# Patient Record
Sex: Female | Born: 1945 | Race: White | Hispanic: No | Marital: Married | Smoking: Never smoker
Health system: Southern US, Community
[De-identification: ages and names within clinical notes are randomized; demographics above are authoritative.]

## PROBLEM LIST (undated history)

## (undated) DIAGNOSIS — E119 Type 2 diabetes mellitus without complications: Secondary | ICD-10-CM

## (undated) DIAGNOSIS — J45909 Unspecified asthma, uncomplicated: Secondary | ICD-10-CM

## (undated) DIAGNOSIS — Z974 Presence of external hearing-aid: Secondary | ICD-10-CM

## (undated) DIAGNOSIS — M1990 Unspecified osteoarthritis, unspecified site: Secondary | ICD-10-CM

## (undated) DIAGNOSIS — M549 Dorsalgia, unspecified: Secondary | ICD-10-CM

## (undated) DIAGNOSIS — M7061 Trochanteric bursitis, right hip: Secondary | ICD-10-CM

## (undated) DIAGNOSIS — F32A Depression, unspecified: Secondary | ICD-10-CM

## (undated) DIAGNOSIS — K7581 Nonalcoholic steatohepatitis (NASH): Secondary | ICD-10-CM

## (undated) DIAGNOSIS — G4733 Obstructive sleep apnea (adult) (pediatric): Secondary | ICD-10-CM

## (undated) DIAGNOSIS — S0300XA Dislocation of jaw, unspecified side, initial encounter: Secondary | ICD-10-CM

## (undated) DIAGNOSIS — H359 Unspecified retinal disorder: Secondary | ICD-10-CM

## (undated) DIAGNOSIS — E785 Hyperlipidemia, unspecified: Secondary | ICD-10-CM

## (undated) DIAGNOSIS — IMO0001 Reserved for inherently not codable concepts without codable children: Secondary | ICD-10-CM

## (undated) DIAGNOSIS — R1110 Vomiting, unspecified: Secondary | ICD-10-CM

## (undated) DIAGNOSIS — N3943 Post-void dribbling: Secondary | ICD-10-CM

## (undated) DIAGNOSIS — H2513 Age-related nuclear cataract, bilateral: Secondary | ICD-10-CM

## (undated) DIAGNOSIS — L409 Psoriasis, unspecified: Secondary | ICD-10-CM

## (undated) DIAGNOSIS — K589 Irritable bowel syndrome without diarrhea: Secondary | ICD-10-CM

## (undated) DIAGNOSIS — K3184 Gastroparesis: Secondary | ICD-10-CM

## (undated) DIAGNOSIS — R0902 Hypoxemia: Secondary | ICD-10-CM

## (undated) DIAGNOSIS — M75102 Unspecified rotator cuff tear or rupture of left shoulder, not specified as traumatic: Secondary | ICD-10-CM

## (undated) DIAGNOSIS — G8929 Other chronic pain: Secondary | ICD-10-CM

## (undated) DIAGNOSIS — R0602 Shortness of breath: Secondary | ICD-10-CM

## (undated) DIAGNOSIS — T7840XA Allergy, unspecified, initial encounter: Secondary | ICD-10-CM

## (undated) DIAGNOSIS — I10 Essential (primary) hypertension: Secondary | ICD-10-CM

## (undated) DIAGNOSIS — M7661 Achilles tendinitis, right leg: Secondary | ICD-10-CM

## (undated) DIAGNOSIS — F329 Major depressive disorder, single episode, unspecified: Secondary | ICD-10-CM

## (undated) HISTORY — DX: Essential (primary) hypertension: I10

## (undated) HISTORY — DX: Unspecified retinal disorder: H35.9

## (undated) HISTORY — DX: Age-related nuclear cataract, bilateral: H25.13

## (undated) HISTORY — DX: Irritable bowel syndrome, unspecified: K58.9

## (undated) HISTORY — DX: Unspecified osteoarthritis, unspecified site: M19.90

## (undated) HISTORY — DX: Other chronic pain: G89.29

## (undated) HISTORY — DX: Depression, unspecified: F32.A

## (undated) HISTORY — DX: Dislocation of jaw, unspecified side, initial encounter: S03.00XA

## (undated) HISTORY — DX: Unspecified rotator cuff tear or rupture of left shoulder, not specified as traumatic: M75.102

## (undated) HISTORY — DX: Obstructive sleep apnea (adult) (pediatric): G47.33

## (undated) HISTORY — DX: Hypoxemia: R09.02

## (undated) HISTORY — DX: Post-void dribbling: N39.43

## (undated) HISTORY — PX: EXCISION / CURETTAGE BONE CYST FINGER: SUR476

## (undated) HISTORY — PX: BREAST BIOPSY: SHX20

## (undated) HISTORY — DX: Allergy, unspecified, initial encounter: T78.40XA

## (undated) HISTORY — DX: Gastroparesis: K31.84

## (undated) HISTORY — DX: Trochanteric bursitis, right hip: M70.61

## (undated) HISTORY — PX: ABDOMINAL HYSTERECTOMY: SHX81

## (undated) HISTORY — DX: Psoriasis, unspecified: L40.9

## (undated) HISTORY — DX: Reserved for inherently not codable concepts without codable children: IMO0001

## (undated) HISTORY — DX: Shortness of breath: R06.02

## (undated) HISTORY — PX: PILONIDAL CYST EXCISION: SHX744

## (undated) HISTORY — PX: TONSILLECTOMY: SUR1361

## (undated) HISTORY — DX: Unspecified asthma, uncomplicated: J45.909

## (undated) HISTORY — DX: Nonalcoholic steatohepatitis (NASH): K75.81

## (undated) HISTORY — DX: Achilles tendinitis, right leg: M76.61

## (undated) HISTORY — PX: APPENDECTOMY: SHX54

## (undated) HISTORY — DX: Major depressive disorder, single episode, unspecified: F32.9

## (undated) HISTORY — PX: STOMACH SURGERY: SHX791

## (undated) HISTORY — DX: Vomiting, unspecified: R11.10

## (undated) HISTORY — DX: Hyperlipidemia, unspecified: E78.5

## (undated) HISTORY — DX: Dorsalgia, unspecified: M54.9

---

## 2007-06-25 LAB — HM COLONOSCOPY: HM Colonoscopy: NORMAL

## 2008-06-24 LAB — HM DEXA SCAN: HM Dexa Scan: NORMAL

## 2009-08-31 ENCOUNTER — Ambulatory Visit: Payer: Self-pay | Admitting: Family Medicine

## 2010-12-18 ENCOUNTER — Ambulatory Visit: Payer: Self-pay | Admitting: Family Medicine

## 2011-04-23 ENCOUNTER — Ambulatory Visit: Payer: Self-pay | Admitting: Unknown Physician Specialty

## 2011-04-23 DIAGNOSIS — Z01810 Encounter for preprocedural cardiovascular examination: Secondary | ICD-10-CM

## 2011-04-23 DIAGNOSIS — Z9889 Other specified postprocedural states: Secondary | ICD-10-CM | POA: Insufficient documentation

## 2011-04-23 HISTORY — PX: SHOULDER ARTHROSCOPY: SHX128

## 2011-12-16 ENCOUNTER — Ambulatory Visit: Payer: Self-pay | Admitting: Family Medicine

## 2011-12-31 ENCOUNTER — Emergency Department: Payer: Self-pay | Admitting: Emergency Medicine

## 2011-12-31 LAB — COMPREHENSIVE METABOLIC PANEL
Alkaline Phosphatase: 85 U/L (ref 50–136)
Anion Gap: 12 (ref 7–16)
Calcium, Total: 9.5 mg/dL (ref 8.5–10.1)
Chloride: 101 mmol/L (ref 98–107)
Co2: 26 mmol/L (ref 21–32)
EGFR (Non-African Amer.): 45 — ABNORMAL LOW
Glucose: 163 mg/dL — ABNORMAL HIGH (ref 65–99)
SGOT(AST): 23 U/L (ref 15–37)
SGPT (ALT): 30 U/L

## 2011-12-31 LAB — CBC
HGB: 14.9 g/dL (ref 12.0–16.0)
MCH: 30 pg (ref 26.0–34.0)
RBC: 4.99 10*6/uL (ref 3.80–5.20)
RDW: 13.3 % (ref 11.5–14.5)

## 2011-12-31 LAB — CK TOTAL AND CKMB (NOT AT ARMC)
CK, Total: 37 U/L (ref 21–215)
CK-MB: 0.6 ng/mL (ref 0.5–3.6)

## 2011-12-31 LAB — TROPONIN I: Troponin-I: <0.02 ng/mL

## 2012-03-26 ENCOUNTER — Ambulatory Visit: Payer: Self-pay | Admitting: Specialist

## 2012-06-29 ENCOUNTER — Ambulatory Visit: Payer: Self-pay | Admitting: Orthopedic Surgery

## 2012-11-10 ENCOUNTER — Ambulatory Visit: Payer: Self-pay | Admitting: Family Medicine

## 2012-11-11 LAB — HM MAMMOGRAPHY: HM Mammogram: NORMAL

## 2014-01-03 DIAGNOSIS — G4733 Obstructive sleep apnea (adult) (pediatric): Secondary | ICD-10-CM | POA: Insufficient documentation

## 2014-04-22 LAB — LIPID PANEL
CHOLESTEROL: 159 mg/dL (ref 0–200)
HDL: 48 mg/dL (ref 35–70)
LDL Cholesterol: 77 mg/dL
TRIGLYCERIDES: 170 mg/dL — AB (ref 40–160)

## 2014-04-26 ENCOUNTER — Ambulatory Visit: Payer: Self-pay | Admitting: Family Medicine

## 2014-08-05 ENCOUNTER — Ambulatory Visit: Payer: Self-pay | Admitting: Vascular Surgery

## 2014-08-30 LAB — HEMOGLOBIN A1C: HEMOGLOBIN A1C: 7.3 % — AB (ref 4.0–6.0)

## 2014-11-23 ENCOUNTER — Telehealth: Payer: Self-pay | Admitting: Family Medicine

## 2014-11-23 NOTE — Telephone Encounter (Signed)
Pt requesting refills and would like them sent to [facility].

## 2014-11-24 ENCOUNTER — Other Ambulatory Visit: Payer: Self-pay | Admitting: Family Medicine

## 2014-11-24 MED ORDER — GABAPENTIN 100 MG PO CAPS: 100.0000 mg | ORAL_CAPSULE | Freq: Three times a day (TID) | ORAL | Status: DC

## 2014-11-24 NOTE — Telephone Encounter (Signed)
Please inform patient I sent rx of gabapentin to mail order.

## 2014-11-28 ENCOUNTER — Other Ambulatory Visit: Payer: Self-pay | Admitting: Family Medicine

## 2014-11-28 MED ORDER — METFORMIN HCL ER 500 MG PO TB24: ORAL_TABLET | ORAL | Status: DC

## 2014-12-02 ENCOUNTER — Other Ambulatory Visit: Payer: Self-pay | Admitting: Family Medicine

## 2014-12-02 NOTE — Telephone Encounter (Signed)
Pt needs refill

## 2014-12-06 ENCOUNTER — Encounter: Payer: Medicare Other | Attending: Specialist | Admitting: Respiratory Therapy

## 2014-12-06 ENCOUNTER — Encounter: Payer: Self-pay | Admitting: Respiratory Therapy

## 2014-12-06 VITALS — BP 118/74 | HR 78 | Resp 12 | Ht 66.0 in | Wt 209.1 lb

## 2014-12-06 DIAGNOSIS — J4520 Mild intermittent asthma, uncomplicated: Secondary | ICD-10-CM | POA: Diagnosis not present

## 2014-12-06 DIAGNOSIS — G4730 Sleep apnea, unspecified: Secondary | ICD-10-CM | POA: Insufficient documentation

## 2014-12-06 NOTE — Progress Notes (Signed)
Pulmonary Individual Treatment Plan  Patient Details  Name: Mary Cantrell MRN: [id number] Date of Birth: 03-10-1946 Referring Provider:  Erby Pian, MD  Initial Encounter Date: Date: 12/06/14  Visit Diagnosis: Asthma in adult, mild intermittent, uncomplicated  Sleep apnea  Patient's Home Medications on Admission:  Current outpatient prescriptions:  .  aspirin 81 MG tablet, Take 81 mg by mouth daily., Disp: , Rfl:  .  budesonide-formoterol (SYMBICORT) 160-4.5 MCG/ACT inhaler, Inhale into the lungs., Disp: , Rfl:  .  gabapentin (NEURONTIN) 100 MG capsule, Take 1 capsule (100 mg total) by mouth 3 (three) times daily., Disp: 180 capsule, Rfl: 4 .  albuterol (VENTOLIN HFA) 108 (90 BASE) MCG/ACT inhaler, Inhale into the lungs., Disp: , Rfl:  .  Cinnamon 500 MG capsule, Take by mouth., Disp: , Rfl:  .  fluticasone (FLONASE) 50 MCG/ACT nasal spray, Place into the nose., Disp: , Rfl:  .  glimepiride (AMARYL) 2 MG tablet, Take by mouth., Disp: , Rfl:  .  losartan (COZAAR) 50 MG tablet, TAKE 1 TABLET DAILY FOR BLOOD PRESSURE, Disp: 90 tablet, Rfl: 0 .  meloxicam (MOBIC) 7.5 MG tablet, Take by mouth., Disp: , Rfl:  .  metFORMIN (GLUCOPHAGE-XR) 500 MG 24 hr tablet, One in am and 2 in pm, Disp: 90 tablet, Rfl: 2 .  pravastatin (PRAVACHOL) 40 MG tablet, Take by mouth., Disp: , Rfl:  .  spironolactone-hydrochlorothiazide (ALDACTAZIDE) 25-25 MG per tablet, Take by mouth., Disp: , Rfl:   Past Medical History: No past medical history on file.  Tobacco Use: History  Smoking status  . Never Smoker   Smokeless tobacco  . Not on file    Labs: Recent Review Flowsheet Data    There is no flowsheet data to display.       ADL [facility]:     ADL [facility]      12/06/14 1130       ADL [facility]   ADL Phase Entry     SOB Score total 73     Rest 2     Walk 2     Stairs 4     Bath 3     Dress 2     Shop 4         Pulmonary Function Assessment:     Pulmonary Function Assessment - 12/06/14  1130    Initial Spirometry Results   FVC% 84 %   FEV1% 88 %   FEV1/FVC Ratio 83.25      Exercise Target Goals: Date: 12/06/14  Exercise Program Goal: Individual exercise prescription set with THRR, safety & activity barriers. Participant demonstrates ability to understand and report RPE using BORG scale, to self-measure pulse accurately, and to acknowledge the importance of the exercise prescription.  Exercise Prescription Goal: Starting with aerobic activity 30 plus minutes a day, 3 days per week for initial exercise prescription. Provide home exercise prescription and guidelines that participant acknowledges understanding prior to discharge.  Activity Barriers & Risk Stratification:     Activity Barriers & Risk Stratification - 12/06/14 1130    Activity Barriers & Risk Stratification   Activity Barriers Shortness of Breath;Deconditioning;Balance Concerns   Risk Stratification Moderate      6 Minute Walk:     6 Minute Walk      12/06/14 1240       6 Minute Walk   Phase Initial     Distance 1320 feet     Walk Time 6 minutes     Resting HR 78 bpm  Resting BP 118/74 mmHg     Max Ex. HR 84 bpm     Max Ex. BP 132/84 mmHg     RPE 13     Perceived Dyspnea  3     Symptoms No        Initial Exercise Prescription:     Initial Exercise Prescription - 12/06/14 1700    Date of Initial Exercise Prescription   Date 12/06/14   Treadmill   MPH 1.5   Grade 0   Minutes 10   Recumbant Bike   Level 1   RPM 40   Watts 20   Minutes 10   NuStep   Level 4   Watts 40   Minutes 10   Arm Ergometer   Level 1   Watts 10   Minutes 10   Arm/Foot Ergometer   Level 1   Watts 12   Minutes 10   Recumbant Elliptical   Level 1   RPM 40   Watts 40   Minutes 10   Elliptical   Level 1   Speed 3   Minutes 1   REL-XR   Level 1   Watts 40   Minutes 10   Prescription Details   Frequency (times per week) 3   Duration Progress to 30 minutes of continuous aerobic without  signs/symptoms of physical distress   Intensity   THRR REST +  30   Ratings of Perceived Exertion 11-15   Perceived Dyspnea 2-4   Progression Continue progressive overload as per policy without signs/symptoms or physical distress.   Resistance Training   Training Prescription Yes   Weight 1   Reps 10-12      Exercise Prescription Changes:   Discharge Exercise Prescription:    Nutrition:  Target Goals: Understanding of nutrition guidelines, daily intake of sodium <1560m, cholesterol <2023m calories 30% from fat and 7% or less from saturated fats, daily to have 5 or more servings of fruits and vegetables.  Biometrics:    Nutrition Therapy Plan and Nutrition Goals:     Nutrition Therapy & Goals - 12/06/14 1130    Nutrition Therapy   Diet Ms MuVonrudenrefers not to meet with the dietitian; she states she known what to do; she eats out mostly, but does eat only half and takes the rest to go home; she would like to loss 20lbs      Nutrition Discharge: Rate Your Plate Scores:   Psychosocial: Target Goals: Acknowledge presence or absence of depression, maximize coping skills, provide positive support system. Participant is able to verbalize types and ability to use techniques and skills needed for reducing stress and depression.  Initial Review & Psychosocial Screening:     Initial Psych Review & Screening - 12/06/14 1130    Family Dynamics   Good Support System? Yes   Comments Her husband and 2 children are very supportive; her sister lives close and is supportive.   Barriers   Psychosocial barriers to participate in program There are no identifiable barriers or psychosocial needs.;The patient should benefit from training in stress management and relaxation.   Screening Interventions   Interventions Encouraged to exercise;Program counselor consult      Quality of Life Scores:     Quality of Life - 12/06/14 1130    Quality of Life Scores   Health/Function Pre  16.59 %   Socioeconomic Pre 23.93 %   Psych/Spiritual Pre 24 %   Family Pre 26.1 %   GLOBAL Pre 20.81 %  PHQ-9:     Recent Review Flowsheet Data    Depression screen [facility] 2/9 12/06/2014   Decreased Interest 0   Down, Depressed, Hopeless 0   PHQ - 2 Score 0      Psychosocial Evaluation and Intervention:   Psychosocial Re-Evaluation:  Education: Education Goals: Education classes will be provided on a weekly basis, covering required topics. Participant will state understanding/return demonstration of topics presented.  Learning Barriers/Preferences:     Learning Barriers/Preferences - 12/06/14 1130    Learning Barriers/Preferences   Learning Barriers None   Learning Preferences Group Instruction;Individual Instruction;Pictoral;Skilled Demonstration;Verbal Instruction;Video;Written Material      Education Topics: Initial Evaluation Education: - Verbal, written and demonstration of respiratory meds, RPE/PD scales, oximetry and breathing techniques. Instruction on use of nebulizers and MDIs: cleaning and proper use, rinsing mouth with steroid doses and importance of monitoring MDI activations.          Most Recent Value   Date  12/06/14   Educator  LB   Instruction Review Code  2- meets goals/outcomes      General Nutrition Guidelines/Fats and Fiber: -Group instruction provided by verbal, written material, models and posters to present the general guidelines for heart healthy nutrition. Gives an explanation and review of dietary fats and fiber.   Controlling Sodium/Reading Food Labels: -Group verbal and written material supporting the discussion of sodium use in heart healthy nutrition. Review and explanation with models, verbal and written materials for utilization of the food label.   Exercise Physiology & Risk Factors: - Group verbal and written instruction with models to review the exercise physiology of the cardiovascular system and associated critical  values. Details cardiovascular disease risk factors and the goals associated with each risk factor.   Aerobic Exercise & Resistance Training: - Gives group verbal and written discussion on the health impact of inactivity. On the components of aerobic and resistive training programs and the benefits of this training and how to safely progress through these programs.   Flexibility, Balance, General Exercise Guidelines: - Provides group verbal and written instruction on the benefits of flexibility and balance training programs. Provides general exercise guidelines with specific guidelines to those with heart or lung disease. Demonstration and skill practice provided.   Stress Management: - Provides group verbal and written instruction about the health risks of elevated stress, cause of high stress, and healthy ways to reduce stress.   Depression: - Provides group verbal and written instruction on the correlation between heart/lung disease and depressed mood, treatment options, and the stigmas associated with seeking treatment.   Exercise & Equipment Safety: - Individual verbal instruction and demonstration of equipment use and safety with use of the equipment.   Infection Prevention: - Provides verbal and written material to individual with discussion of infection control including proper hand washing and proper equipment cleaning during exercise session.   Falls Prevention: - Provides verbal and written material to individual with discussion of falls prevention and safety.   Diabetes: - Individual verbal and written instruction to review signs/symptoms of diabetes, desired ranges of glucose level fasting, after meals and with exercise. Advice that pre and post exercise glucose checks will be done for 3 sessions at entry of program.   Chronic Lung Diseases: - Group verbal and written instruction to review new updates, new respiratory medications, new advancements in procedures and  treatments. Provide informative websites and "800" numbers of self-education.   Lung Procedures: - Group verbal and written instruction to describe testing methods done to  diagnose lung disease. Review the outcome of test results. Describe the treatment choices: Pulmonary Function Tests, ABGs and oximetry.   Energy Conservation: - Provide group verbal and written instruction for methods to conserve energy, plan and organize activities. Instruct on pacing techniques, use of adaptive equipment and posture/positioning to relieve shortness of breath.   Triggers: - Group verbal and written instruction to review types of environmental controls: home humidity, furnaces, filters, dust mite/pet prevention, HEPA vacuums. To discuss weather changes, air quality and the benefits of nasal washing.   Exacerbations: - Group verbal and written instruction to provide: warning signs, infection symptoms, calling MD promptly, preventive modes, and value of vaccinations. Review: effective airway clearance, coughing and/or vibration techniques. Create an Sports administrator.   Oxygen: - Individual and group verbal and written instruction on oxygen therapy. Includes supplement oxygen, available portable oxygen systems, continuous and intermittent flow rates, oxygen safety, concentrators, and Medicare reimbursement for oxygen.   Respiratory Medications: - Group verbal and written instruction to review medications for lung disease. Drug class, frequency, complications, importance of spacers, rinsing mouth after steroid MDI's, and proper cleaning methods for nebulizers.      Most Recent Value   Date  12/06/14   Educator  LB   Instruction Review Code  2- meets goals/outcomes      AED/CPR: - Group verbal and written instruction with the use of models to demonstrate the basic use of the AED with the basic ABC's of resuscitation.   Breathing Retraining: - Provides individuals verbal and written instruction on  purpose, frequency, and proper technique of diaphragmatic breathing and pursed-lipped breathing. Applies individual practice skills.      Most Recent Value   Date  12/06/14   Educator  LB   Instruction Review Code  2- meets goals/outcomes      Anatomy and Physiology of the Lungs: - Group verbal and written instruction with the use of models to provide basic lung anatomy and physiology related to function, structure and complications of lung disease.   Heart Failure: - Group verbal and written instruction on the basics of heart failure: signs/symptoms, treatments, explanation of ejection fraction, enlarged heart and cardiomyopathy.   Sleep Apnea: - Individual verbal and written instruction to review Obstructive Sleep Apnea. Review of risk factors, methods for diagnosing and types of masks and machines for OSA.   Anxiety: - Provides group, verbal and written instruction on the correlation between heart/lung disease and anxiety, treatment options, and management of anxiety.   Relaxation: - Provides group, verbal and written instruction about the benefits of relaxation for patients with heart/lung disease. Also provides patients with examples of relaxation techniques.   Knowledge Questionnaire Score:     Knowledge Questionnaire Score - 12/06/14 1130    Knowledge Questionnaire Score   Pre Score -2      Personal Goals and Risk Factors at Admission:   Personal Goals and Risk Factors Review:    Personal Goals Discharge:    Comments:

## 2014-12-06 NOTE — Progress Notes (Signed)
Pulmonary Individual Treatment Plan  Patient Details  Name: Mary Cantrell MRN: [id number] Date of Birth: 1946-01-26 Referring Provider:  Erby Pian, MD  Initial Encounter Date:    Visit Diagnosis: Asthma in adult, mild intermittent, uncomplicated  Sleep apnea  Patient's Home Medications on Admission:  Current outpatient prescriptions:    gabapentin (NEURONTIN) 100 MG capsule, Take 1 capsule (100 mg total) by mouth 3 (three) times daily., Disp: 180 capsule, Rfl: 4   losartan (COZAAR) 50 MG tablet, TAKE 1 TABLET DAILY FOR BLOOD PRESSURE, Disp: 90 tablet, Rfl: 0   metFORMIN (GLUCOPHAGE-XR) 500 MG 24 hr tablet, One in am and 2 in pm, Disp: 90 tablet, Rfl: 2  Past Medical History: No past medical history on file.  Tobacco Use: History  Smoking status   Never Smoker   Smokeless tobacco   Not on file    Labs: Recent Review Flowsheet Data    There is no flowsheet data to display.       ADL [facility]:     ADL [facility]      12/06/14 1130       ADL [facility]   ADL Phase Entry     SOB Score total 73     Rest 2     Walk 2     Stairs 4     Bath 3     Dress 2     Shop 4         Pulmonary Function Assessment:     Pulmonary Function Assessment - 12/06/14 1130    Initial Spirometry Results   FVC% 84 %   FEV1% 88 %   FEV1/FVC Ratio 83.25      Exercise Target Goals:    Exercise Program Goal: Individual exercise prescription set with THRR, safety & activity barriers. Participant demonstrates ability to understand and report RPE using BORG scale, to self-measure pulse accurately, and to acknowledge the importance of the exercise prescription.  Exercise Prescription Goal: Starting with aerobic activity 30 plus minutes a day, 3 days per week for initial exercise prescription. Provide home exercise prescription and guidelines that participant acknowledges understanding prior to discharge.  Activity Barriers & Risk Stratification:     Activity Barriers & Risk  Stratification - 12/06/14 1130    Activity Barriers & Risk Stratification   Activity Barriers Shortness of Breath;Deconditioning;Balance Concerns   Risk Stratification Moderate      6 Minute Walk:   Initial Exercise Prescription:   Exercise Prescription Changes:   Discharge Exercise Prescription:    Nutrition:  Target Goals: Understanding of nutrition guidelines, daily intake of sodium <1537m, cholesterol <2065m calories 30% from fat and 7% or less from saturated fats, daily to have 5 or more servings of fruits and vegetables.  Biometrics:    Nutrition Therapy Plan and Nutrition Goals:     Nutrition Therapy & Goals - 12/06/14 1130    Nutrition Therapy   Diet Ms MuCavanaughrefers not to meet with the dietitian; she states she known what to do; she eats out mostly, but does eat only half and takes the rest to go home; she would like to loss 20lbs      Nutrition Discharge: Rate Your Plate Scores:   Psychosocial: Target Goals: Acknowledge presence or absence of depression, maximize coping skills, provide positive support system. Participant is able to verbalize types and ability to use techniques and skills needed for reducing stress and depression.  Initial Review & Psychosocial Screening:     Initial Psych Review &  Screening - 12/06/14 [place]? Yes   Comments Her husband and 2 children are very supportive; her sister lives close and is supportive.   Barriers   Psychosocial barriers to participate in program There are no identifiable barriers or psychosocial needs.;The patient should benefit from training in stress management and relaxation.   Screening Interventions   Interventions Encouraged to exercise;Program counselor consult      Quality of Life Scores:     Quality of Life - 12/06/14 1130    Quality of Life Scores   Health/Function Pre 16.59 %   Socioeconomic Pre 23.93 %   Psych/Spiritual Pre 24 %   Family Pre  26.1 %   GLOBAL Pre 20.81 %      PHQ-9:     Recent Review Flowsheet Data    Depression screen [facility] 2/9 12/06/2014   Decreased Interest 0   Down, Depressed, Hopeless 0   PHQ - 2 Score 0      Psychosocial Evaluation and Intervention:   Psychosocial Re-Evaluation:  Education: Education Goals: Education classes will be provided on a weekly basis, covering required topics. Participant will state understanding/return demonstration of topics presented.  Learning Barriers/Preferences:     Learning Barriers/Preferences - 12/06/14 1130    Learning Barriers/Preferences   Learning Barriers None   Learning Preferences Group Instruction;Individual Instruction;Pictoral;Skilled Demonstration;Verbal Instruction;Video;Written Material      Education Topics: Initial Evaluation Education: - Verbal, written and demonstration of respiratory meds, RPE/PD scales, oximetry and breathing techniques. Instruction on use of nebulizers and MDIs: cleaning and proper use, rinsing mouth with steroid doses and importance of monitoring MDI activations.          Most Recent Value   Date  12/06/14   Educator  LB   Instruction Review Code  2- meets goals/outcomes      General Nutrition Guidelines/Fats and Fiber: -Group instruction provided by verbal, written material, models and posters to present the general guidelines for heart healthy nutrition. Gives an explanation and review of dietary fats and fiber.   Controlling Sodium/Reading Food Labels: -Group verbal and written material supporting the discussion of sodium use in heart healthy nutrition. Review and explanation with models, verbal and written materials for utilization of the food label.   Exercise Physiology & Risk Factors: - Group verbal and written instruction with models to review the exercise physiology of the cardiovascular system and associated critical values. Details cardiovascular disease risk factors and the goals associated with  each risk factor.   Aerobic Exercise & Resistance Training: - Gives group verbal and written discussion on the health impact of inactivity. On the components of aerobic and resistive training programs and the benefits of this training and how to safely progress through these programs.   Flexibility, Balance, General Exercise Guidelines: - Provides group verbal and written instruction on the benefits of flexibility and balance training programs. Provides general exercise guidelines with specific guidelines to those with heart or lung disease. Demonstration and skill practice provided.   Stress Management: - Provides group verbal and written instruction about the health risks of elevated stress, cause of high stress, and healthy ways to reduce stress.   Depression: - Provides group verbal and written instruction on the correlation between heart/lung disease and depressed mood, treatment options, and the stigmas associated with seeking treatment.   Exercise & Equipment Safety: - Individual verbal instruction and demonstration of equipment use and safety with use of the equipment.   Infection  Prevention: - Provides verbal and written material to individual with discussion of infection control including proper hand washing and proper equipment cleaning during exercise session.   Falls Prevention: - Provides verbal and written material to individual with discussion of falls prevention and safety.   Diabetes: - Individual verbal and written instruction to review signs/symptoms of diabetes, desired ranges of glucose level fasting, after meals and with exercise. Advice that pre and post exercise glucose checks will be done for 3 sessions at entry of program.   Chronic Lung Diseases: - Group verbal and written instruction to review new updates, new respiratory medications, new advancements in procedures and treatments. Provide informative websites and "800" numbers of  self-education.   Lung Procedures: - Group verbal and written instruction to describe testing methods done to diagnose lung disease. Review the outcome of test results. Describe the treatment choices: Pulmonary Function Tests, ABGs and oximetry.   Energy Conservation: - Provide group verbal and written instruction for methods to conserve energy, plan and organize activities. Instruct on pacing techniques, use of adaptive equipment and posture/positioning to relieve shortness of breath.   Triggers: - Group verbal and written instruction to review types of environmental controls: home humidity, furnaces, filters, dust mite/pet prevention, HEPA vacuums. To discuss weather changes, air quality and the benefits of nasal washing.   Exacerbations: - Group verbal and written instruction to provide: warning signs, infection symptoms, calling MD promptly, preventive modes, and value of vaccinations. Review: effective airway clearance, coughing and/or vibration techniques. Create an Sports administrator.   Oxygen: - Individual and group verbal and written instruction on oxygen therapy. Includes supplement oxygen, available portable oxygen systems, continuous and intermittent flow rates, oxygen safety, concentrators, and Medicare reimbursement for oxygen.   Respiratory Medications: - Group verbal and written instruction to review medications for lung disease. Drug class, frequency, complications, importance of spacers, rinsing mouth after steroid MDI's, and proper cleaning methods for nebulizers.      Most Recent Value   Date  12/06/14   Educator  LB   Instruction Review Code  2- meets goals/outcomes      AED/CPR: - Group verbal and written instruction with the use of models to demonstrate the basic use of the AED with the basic ABC's of resuscitation.   Breathing Retraining: - Provides individuals verbal and written instruction on purpose, frequency, and proper technique of diaphragmatic breathing  and pursed-lipped breathing. Applies individual practice skills.      Most Recent Value   Date  12/06/14   Educator  LB   Instruction Review Code  2- meets goals/outcomes      Anatomy and Physiology of the Lungs: - Group verbal and written instruction with the use of models to provide basic lung anatomy and physiology related to function, structure and complications of lung disease.   Heart Failure: - Group verbal and written instruction on the basics of heart failure: signs/symptoms, treatments, explanation of ejection fraction, enlarged heart and cardiomyopathy.   Sleep Apnea: - Individual verbal and written instruction to review Obstructive Sleep Apnea. Review of risk factors, methods for diagnosing and types of masks and machines for OSA.   Anxiety: - Provides group, verbal and written instruction on the correlation between heart/lung disease and anxiety, treatment options, and management of anxiety.   Relaxation: - Provides group, verbal and written instruction about the benefits of relaxation for patients with heart/lung disease. Also provides patients with examples of relaxation techniques.   Knowledge Questionnaire Score:     Knowledge Questionnaire  Score - 12/06/14 1130    Knowledge Questionnaire Score   Pre Score -2      Personal Goals and Risk Factors at Admission:   Personal Goals and Risk Factors Review:    Personal Goals Discharge:    Comments: Ms Devora plans to start [facility] on 12/12/2014 and attend 3 days/week.

## 2014-12-06 NOTE — Patient Instructions (Addendum)
Patient Instructions  Patient Details  Name: Mary Cantrell MRN: [id number] Date of Birth: May 27, 1946 Referring Provider:  Erby Pian, MD  Below are the personal goals you chose as well as exercise and nutrition goals. Our goal is to help you keep on track towards obtaining and maintaining your goals. We will be discussing your progress on these goals with you throughout the program.  Initial Exercise Prescription:     Initial Exercise Prescription - 12/06/14 1700    Date of Initial Exercise Prescription   Date 12/06/14   Treadmill   MPH 1.5   Grade 0   Minutes 10   Recumbant Bike   Level 1   RPM 40   Watts 20   Minutes 10   NuStep   Level 4   Watts 40   Minutes 10   Arm Ergometer   Level 1   Watts 10   Minutes 10   Arm/Foot Ergometer   Level 1   Watts 12   Minutes 10   Recumbant Elliptical   Level 1   RPM 40   Watts 40   Minutes 10   Elliptical   Level 1   Speed 3   Minutes 1   REL-XR   Level 1   Watts 40   Minutes 10   Prescription Details   Frequency (times per week) 3   Duration Progress to 30 minutes of continuous aerobic without signs/symptoms of physical distress   Intensity   THRR REST +  30   Ratings of Perceived Exertion 11-15   Perceived Dyspnea 2-4   Progression Continue progressive overload as per policy without signs/symptoms or physical distress.   Resistance Training   Training Prescription Yes   Weight 1   Reps 10-12      Exercise Goals: Frequency: Be able to perform aerobic exercise three times per week working toward 3-5 days per week.  Intensity: Work with a perceived exertion of 11 (fairly light) - 15 (hard) as tolerated. Follow your new exercise prescription and watch for changes in prescription as you progress with the program. Changes will be reviewed with you when they are made.  Duration: You should be able to do 30 minutes of continuous aerobic exercise in addition to a 5 minute warm-up and a 5 minute cool-down  routine.  Nutrition Goals: Your personal nutrition goals will be established when you do your nutrition analysis with the dietician.  The following are nutrition guidelines to follow: Cholesterol < 215m/day Sodium < 15073mday Fiber: Women over 50 yrs - 21 grams per day  Personal Goals:     Personal Goals and Risk Factors at Admission - 12/07/14 0835    Personal Goals and Risk Factors on Admission    Weight Management Yes   Intervention Learn and follow the exercise and diet guidelines while in the program. Utilize the nutrition and education classes to help gain knowledge of the diet and exercise expectations in the program   Increase Aerobic Exercise and Physical Activity Yes   Intervention While in program, learn and follow the exercise prescription taught. Start at a low level workload and increase workload after able to maintain previous level for 30 minutes. Increase time before increasing intensity.   Understand more about Heart/Pulmonary Disease. Yes   Intervention While in program utilize professionals for any questions, and attend the education sessions. Great websites to use are www.americanheart.org or www.lung.org for reliable information.   Improve shortness of breath with ADL's Yes   Intervention  While in program, learn and follow the exercise prescription taught. Start at a low level workload and increase workload ad advised by the exercise physiologist. Increase time before increasing intensity.   Develop more efficient breathing techniques such as purse lipped breathing and diaphragmatic breathing; and practicing self-pacing with activity Yes   Intervention While in program, learn and utilize the specific breathing techniques taught to you. Continue to practice and use the techniques as needed.   Increase knowledge of respiratory medications and ability to use respiratory devices properly.  Yes   Intervention While in program, learn to administer MDI, nebulizer, and  spacer properly.;Learn to take respiratory medicine as ordered.;While in program, learn to Clean MDI, nebulizers, and spacers properly.   Diabetes Yes   Goal Blood glucose control identified by blood glucose values, HgbA1C. Participant verbalizes understanding of the signs/symptoms of hyper/hypo glycemia, proper foot care and importance of medication and nutrition plan for blood glucose control.   Intervention Provide nutrition & aerobic exercise along with prescribed medications to achieve blood glucose in normal ranges: Fasting 65-99 mg/dL   Hypertension Yes   Goal Participant will see blood pressure controlled within the values of 140/62m/Hg or within value directed by their physician.   Intervention Provide nutrition & aerobic exercise along with prescribed medications to achieve BP 140/90 or less.      Tobacco Use Initial Evaluation: History  Smoking status  . Never Smoker   Smokeless tobacco  . Not on file    Copy of goals given to participant.

## 2014-12-12 ENCOUNTER — Encounter: Payer: Medicare Other | Admitting: *Deleted

## 2014-12-12 DIAGNOSIS — J4520 Mild intermittent asthma, uncomplicated: Secondary | ICD-10-CM

## 2014-12-12 DIAGNOSIS — G4730 Sleep apnea, unspecified: Secondary | ICD-10-CM

## 2014-12-12 LAB — GLUCOSE, CAPILLARY: Glucose-Capillary: 117 mg/dL — ABNORMAL HIGH (ref 65–99)

## 2014-12-12 NOTE — Progress Notes (Signed)
Daily Session Note  Patient Details  Name: Josclyn Rosales MRN: [id number] Date of Birth: 01/10/1946 Referring Provider:  Erby Pian, MD  Encounter Date: 12/12/2014  Check In:     Session Check In - 12/12/14 1227    Check-In   Staff Present Laureen Owens Shark BS, RRT, Respiratory Therapist;Anthoney Sheppard RN, BSN;Steven Way BS, ACSM EP-C, Exercise Physiologist   ER physicians immediately available to respond to emergencies [facility] immediately available ER MD   Physician(s) Dr. Joni Fears and Dr. Corky Downs   Medication changes reported     No   Fall or balance concerns reported    No   Warm-up and Cool-down Performed on first and last piece of equipment   VAD Patient? No   Pain Assessment   Currently in Pain? No/denies         Goals Met:  Proper associated with RPD/PD & O2 Sat Exercise tolerated well  Goals Unmet:  Not Applicable  Goals Comments:    Dr. Emily Filbert is Medical Director for [place] and [facility].

## 2014-12-12 NOTE — Addendum Note (Signed)
Addended by: Joretta Bachelor on: 12/12/2014 08:30 AM   Modules accepted: Orders

## 2014-12-12 NOTE — Progress Notes (Signed)
Pulmonary Individual Treatment Plan  Patient Details  Name: Mary Cantrell MRN: [id number] Date of Birth: 1946/01/17 Referring Provider:  Erby Pian, MD  Initial Encounter Date:    Visit Diagnosis: Sleep apnea  Asthma in adult, mild intermittent, uncomplicated  Patient's Home Medications on Admission:  Current outpatient prescriptions:    albuterol (VENTOLIN HFA) 108 (90 BASE) MCG/ACT inhaler, Inhale into the lungs., Disp: , Rfl:    aspirin 81 MG tablet, Take 81 mg by mouth daily., Disp: , Rfl:    budesonide-formoterol (SYMBICORT) 160-4.5 MCG/ACT inhaler, Inhale into the lungs., Disp: , Rfl:    Cinnamon 500 MG capsule, Take by mouth., Disp: , Rfl:    fluticasone (FLONASE) 50 MCG/ACT nasal spray, Place into the nose., Disp: , Rfl:    gabapentin (NEURONTIN) 100 MG capsule, Take 1 capsule (100 mg total) by mouth 3 (three) times daily., Disp: 180 capsule, Rfl: 4   glimepiride (AMARYL) 2 MG tablet, Take by mouth., Disp: , Rfl:    losartan (COZAAR) 50 MG tablet, TAKE 1 TABLET DAILY FOR BLOOD PRESSURE, Disp: 90 tablet, Rfl: 0   meloxicam (MOBIC) 7.5 MG tablet, Take by mouth., Disp: , Rfl:    metFORMIN (GLUCOPHAGE-XR) 500 MG 24 hr tablet, One in am and 2 in pm, Disp: 90 tablet, Rfl: 2   pravastatin (PRAVACHOL) 40 MG tablet, Take by mouth., Disp: , Rfl:    spironolactone-hydrochlorothiazide (ALDACTAZIDE) 25-25 MG per tablet, Take by mouth., Disp: , Rfl:   Past Medical History: No past medical history on file.  Tobacco Use: History  Smoking status   Never Smoker   Smokeless tobacco   Not on file    Labs: Recent Review Flowsheet Data    There is no flowsheet data to display.         POCT Glucose      12/12/14 1130           POCT Blood Glucose   Pre-Exercise 193 mg/dL       Post-Exercise 117 mg/dL          ADL UCSD:     ADL UCSD      12/06/14 1130       ADL UCSD   ADL Phase Entry     SOB Score total 73     Rest 2     Walk 2     Stairs 4     Bath 3     Dress 2     Shop 4         Pulmonary Function Assessment:     Pulmonary Function Assessment - 12/06/14 1130    Initial Spirometry Results   FVC% 84 %   FEV1% 88 %   FEV1/FVC Ratio 83.25      Exercise Target Goals:    Exercise Program Goal: Individual exercise prescription set with THRR, safety & activity barriers. Participant demonstrates ability to understand and report RPE using BORG scale, to self-measure pulse accurately, and to acknowledge the importance of the exercise prescription.  Exercise Prescription Goal: Starting with aerobic activity 30 plus minutes a day, 3 days per week for initial exercise prescription. Provide home exercise prescription and guidelines that participant acknowledges understanding prior to discharge.  Activity Barriers & Risk Stratification:     Activity Barriers & Risk Stratification - 12/06/14 1130    Activity Barriers & Risk Stratification   Activity Barriers Shortness of Breath;Deconditioning;Balance Concerns   Risk Stratification Moderate      6 Minute Walk:  6 Minute Walk      12/06/14 1240       6 Minute Walk   Phase Initial     Distance 1320 feet     Walk Time 6 minutes     Resting HR 78 bpm     Resting BP 118/74 mmHg     Max Ex. HR 84 bpm     Max Ex. BP 132/84 mmHg     RPE 13     Perceived Dyspnea  3     Symptoms No        Initial Exercise Prescription:     Initial Exercise Prescription - 12/06/14 1700    Date of Initial Exercise Prescription   Date 12/06/14   Treadmill   MPH 1.5   Grade 0   Minutes 10   Recumbant Bike   Level 1   RPM 40   Watts 20   Minutes 10   NuStep   Level 4   Watts 40   Minutes 10   Arm Ergometer   Level 1   Watts 10   Minutes 10   Arm/Foot Ergometer   Level 1   Watts 12   Minutes 10   Recumbant Elliptical   Level 1   RPM 40   Watts 40   Minutes 10   Elliptical   Level 1   Speed 3   Minutes 1   REL-XR   Level 1   Watts 40   Minutes 10    Prescription Details   Frequency (times per week) 3   Duration Progress to 30 minutes of continuous aerobic without signs/symptoms of physical distress   Intensity   THRR REST +  30   Ratings of Perceived Exertion 11-15   Perceived Dyspnea 2-4   Progression Continue progressive overload as per policy without signs/symptoms or physical distress.   Resistance Training   Training Prescription Yes   Weight 1   Reps 10-12      Exercise Prescription Changes:   Discharge Exercise Prescription (Final Exercise Prescription Changes):    Nutrition:  Target Goals: Understanding of nutrition guidelines, daily intake of sodium <1514m, cholesterol <2064m calories 30% from fat and 7% or less from saturated fats, daily to have 5 or more servings of fruits and vegetables.  Biometrics:    Nutrition Therapy Plan and Nutrition Goals:     Nutrition Therapy & Goals - 12/06/14 1130    Nutrition Therapy   Diet [name] not to meet with the dietitian; she states she known what to do; she eats out mostly, but does eat only half and takes the rest to go home; she would like to loss 20lbs      Nutrition Discharge: Rate Your Plate Scores:   Psychosocial: Target Goals: Acknowledge presence or absence of depression, maximize coping skills, provide positive support system. Participant is able to verbalize types and ability to use techniques and skills needed for reducing stress and depression.  Initial Review & Psychosocial Screening:     Initial Psych Review & Screening - 12/06/14 1130    Family Dynamics   Good Support System? Yes   Comments Her husband and 2 children are very supportive; her sister lives close and is supportive.   Barriers   Psychosocial barriers to participate in program There are no identifiable barriers or psychosocial needs.;The patient should benefit from training in stress management and relaxation.   Screening Interventions   Interventions Encouraged to  exercise;Program counselor consult  Quality of Life Scores:     Quality of Life - 12/06/14 1130    Quality of Life Scores   Health/Function Pre 16.59 %   Socioeconomic Pre 23.93 %   Psych/Spiritual Pre 24 %   Family Pre 26.1 %   GLOBAL Pre 20.81 %      PHQ-9:     Recent Review Flowsheet Data    Depression screen [facility] 2/9 12/06/2014   Decreased Interest 0   Down, Depressed, Hopeless 0   PHQ - 2 Score 0      Psychosocial Evaluation and Intervention:     Psychosocial Evaluation - 12/12/14 1254    Psychosocial Evaluation & Interventions   Comments Counselor met with Ms. Tetro for initial psychosocial evaluation.   She is a 69 year old female who has asthma, sleep apnea and type 2 diabetes.  She has a strong support system with a spouse of 31 years.  Ms. Tawney states she has been sleeping better since she began using a CPAP machine.  Her appetite is "okay" and she reports no current symptoms of depression or anxiety, although there was a history of depression "many years ago."  Ms. Longino states that her health issues and subsequent limitations are stressful for her making traveling and gardening difficult.  She presents in a positive mood and has goals to increase her stamina, strength, and be able to breathe better.  Counselor encouraged her to consistently exercise to accomplish these goals.          Psychosocial Re-Evaluation:  Education: Education Goals: Education classes will be provided on a weekly basis, covering required topics. Participant will state understanding/return demonstration of topics presented.  Learning Barriers/Preferences:     Learning Barriers/Preferences - 12/06/14 1130    Learning Barriers/Preferences   Learning Barriers None   Learning Preferences Group Instruction;Individual Instruction;Pictoral;Skilled Demonstration;Verbal Instruction;Video;Written Material      Education Topics: Initial Evaluation Education: - Verbal, written and  demonstration of respiratory meds, RPE/PD scales, oximetry and breathing techniques. Instruction on use of nebulizers and MDIs: cleaning and proper use, rinsing mouth with steroid doses and importance of monitoring MDI activations.          Most Recent Value   Date  12/06/14   Educator  LB   Instruction Review Code  2- meets goals/outcomes      General Nutrition Guidelines/Fats and Fiber: -Group instruction provided by verbal, written material, models and posters to present the general guidelines for heart healthy nutrition. Gives an explanation and review of dietary fats and fiber.   Controlling Sodium/Reading Food Labels: -Group verbal and written material supporting the discussion of sodium use in heart healthy nutrition. Review and explanation with models, verbal and written materials for utilization of the food label.   Exercise Physiology & Risk Factors: - Group verbal and written instruction with models to review the exercise physiology of the cardiovascular system and associated critical values. Details cardiovascular disease risk factors and the goals associated with each risk factor.   Aerobic Exercise & Resistance Training: - Gives group verbal and written discussion on the health impact of inactivity. On the components of aerobic and resistive training programs and the benefits of this training and how to safely progress through these programs.   Flexibility, Balance, General Exercise Guidelines: - Provides group verbal and written instruction on the benefits of flexibility and balance training programs. Provides general exercise guidelines with specific guidelines to those with heart or lung disease. Demonstration and skill practice provided.  Stress Management: - Provides group verbal and written instruction about the health risks of elevated stress, cause of high stress, and healthy ways to reduce stress.   Depression: - Provides group verbal and written instruction  on the correlation between heart/lung disease and depressed mood, treatment options, and the stigmas associated with seeking treatment.   Exercise & Equipment Safety: - Individual verbal instruction and demonstration of equipment use and safety with use of the equipment.      Most Recent Value   Date  12/12/14   Educator  L. Owens Shark, RT   Instruction Review Code  2- meets goals/outcomes      Infection Prevention: - Provides verbal and written material to individual with discussion of infection control including proper hand washing and proper equipment cleaning during exercise session.      Most Recent Value   Date  12/12/14   Educator  LB   Instruction Review Code  2- meets goals/outcomes      Falls Prevention: - Provides verbal and written material to individual with discussion of falls prevention and safety.   Diabetes: - Individual verbal and written instruction to review signs/symptoms of diabetes, desired ranges of glucose level fasting, after meals and with exercise. Advice that pre and post exercise glucose checks will be done for 3 sessions at entry of program.   Chronic Lung Diseases: - Group verbal and written instruction to review new updates, new respiratory medications, new advancements in procedures and treatments. Provide informative websites and "800" numbers of self-education.   Lung Procedures: - Group verbal and written instruction to describe testing methods done to diagnose lung disease. Review the outcome of test results. Describe the treatment choices: Pulmonary Function Tests, ABGs and oximetry.   Energy Conservation: - Provide group verbal and written instruction for methods to conserve energy, plan and organize activities. Instruct on pacing techniques, use of adaptive equipment and posture/positioning to relieve shortness of breath.   Triggers: - Group verbal and written instruction to review types of environmental controls: home humidity, furnaces,  filters, dust mite/pet prevention, HEPA vacuums. To discuss weather changes, air quality and the benefits of nasal washing.   Exacerbations: - Group verbal and written instruction to provide: warning signs, infection symptoms, calling MD promptly, preventive modes, and value of vaccinations. Review: effective airway clearance, coughing and/or vibration techniques. Create an Sports administrator.   Oxygen: - Individual and group verbal and written instruction on oxygen therapy. Includes supplement oxygen, available portable oxygen systems, continuous and intermittent flow rates, oxygen safety, concentrators, and Medicare reimbursement for oxygen.   Respiratory Medications: - Group verbal and written instruction to review medications for lung disease. Drug class, frequency, complications, importance of spacers, rinsing mouth after steroid MDI's, and proper cleaning methods for nebulizers.      Most Recent Value   Date  12/06/14   Educator  LB   Instruction Review Code  2- meets goals/outcomes      AED/CPR: - Group verbal and written instruction with the use of models to demonstrate the basic use of the AED with the basic ABC's of resuscitation.   Breathing Retraining: - Provides individuals verbal and written instruction on purpose, frequency, and proper technique of diaphragmatic breathing and pursed-lipped breathing. Applies individual practice skills.      Most Recent Value   Date  12/06/14   Educator  LB   Instruction Review Code  2- meets goals/outcomes      Anatomy and Physiology of the Lungs: - Group verbal and written  instruction with the use of models to provide basic lung anatomy and physiology related to function, structure and complications of lung disease.   Heart Failure: - Group verbal and written instruction on the basics of heart failure: signs/symptoms, treatments, explanation of ejection fraction, enlarged heart and cardiomyopathy.   Sleep Apnea: - Individual verbal  and written instruction to review Obstructive Sleep Apnea. Review of risk factors, methods for diagnosing and types of masks and machines for OSA.   Anxiety: - Provides group, verbal and written instruction on the correlation between heart/lung disease and anxiety, treatment options, and management of anxiety.   Relaxation: - Provides group, verbal and written instruction about the benefits of relaxation for patients with heart/lung disease. Also provides patients with examples of relaxation techniques.   Knowledge Questionnaire Score:     Knowledge Questionnaire Score - 12/06/14 1130    Knowledge Questionnaire Score   Pre Score -2      Personal Goals and Risk Factors at Admission:     Personal Goals and Risk Factors at Admission - 12/07/14 0835    Personal Goals and Risk Factors on Admission    Weight Management Yes   Intervention Learn and follow the exercise and diet guidelines while in the program. Utilize the nutrition and education classes to help gain knowledge of the diet and exercise expectations in the program   Increase Aerobic Exercise and Physical Activity Yes   Intervention While in program, learn and follow the exercise prescription taught. Start at a low level workload and increase workload after able to maintain previous level for 30 minutes. Increase time before increasing intensity.   Understand more about Heart/Pulmonary Disease. Yes   Intervention While in program utilize professionals for any questions, and attend the education sessions. Great websites to use are www.americanheart.org or www.lung.org for reliable information.   Improve shortness of breath with ADL's Yes   Intervention While in program, learn and follow the exercise prescription taught. Start at a low level workload and increase workload ad advised by the exercise physiologist. Increase time before increasing intensity.   Develop more efficient breathing techniques such as purse lipped breathing  and diaphragmatic breathing; and practicing self-pacing with activity Yes   Intervention While in program, learn and utilize the specific breathing techniques taught to you. Continue to practice and use the techniques as needed.   Increase knowledge of respiratory medications and ability to use respiratory devices properly.  Yes   Intervention While in program, learn to administer MDI, nebulizer, and spacer properly.;Learn to take respiratory medicine as ordered.;While in program, learn to Clean MDI, nebulizers, and spacers properly.   Diabetes Yes   Goal Blood glucose control identified by blood glucose values, HgbA1C. Participant verbalizes understanding of the signs/symptoms of hyper/hypo glycemia, proper foot care and importance of medication and nutrition plan for blood glucose control.   Intervention Provide nutrition & aerobic exercise along with prescribed medications to achieve blood glucose in normal ranges: Fasting 65-99 mg/dL   Hypertension Yes   Goal Participant will see blood pressure controlled within the values of 140/69m/Hg or within value directed by their physician.   Intervention Provide nutrition & aerobic exercise along with prescribed medications to achieve BP 140/90 or less.      Personal Goals and Risk Factors Review:    Personal Goals Discharge:    Comments: First day of exercise in [facility]

## 2014-12-13 ENCOUNTER — Encounter: Payer: Self-pay | Admitting: Internal Medicine

## 2014-12-14 ENCOUNTER — Encounter: Payer: Self-pay | Admitting: Internal Medicine

## 2014-12-14 DIAGNOSIS — J4520 Mild intermittent asthma, uncomplicated: Secondary | ICD-10-CM | POA: Diagnosis not present

## 2014-12-14 DIAGNOSIS — G4733 Obstructive sleep apnea (adult) (pediatric): Secondary | ICD-10-CM

## 2014-12-14 LAB — GLUCOSE, CAPILLARY
Glucose-Capillary: 218 mg/dL — ABNORMAL HIGH (ref 65–99)
Glucose-Capillary: 159 mg/dL — ABNORMAL HIGH (ref 65–99)

## 2014-12-14 NOTE — Progress Notes (Signed)
Daily Session Note  Patient Details  Name: Mary Cantrell MRN: [id number] Date of Birth: 07-11-1945 Referring Provider:  Steele Sizer, MD  Encounter Date: 12/14/2014  Check In:     Session Check In - 12/14/14 1155    Check-In   Staff Present Candiss Norse MS, ACSM CEP Exercise Physiologist;Icarus Partch BS, ACSM EP-C, Exercise Physiologist;Laureen Janell Quiet, RRT, Respiratory Therapist   ER physicians immediately available to respond to emergencies [facility] immediately available ER MD   Physician(s) Thomasene Lot and lord   Medication changes reported     No   Fall or balance concerns reported    No   Warm-up and Cool-down Performed on first and last piece of equipment   VAD Patient? No   Pain Assessment   Currently in Pain? No/denies         Goals Met:  Proper associated with RPD/PD & O2 Sat Exercise tolerated well No report of cardiac concerns or symptoms Strength training completed today  Goals Unmet:  Not Applicable  Goals Comments:    Dr. Emily Filbert is Medical Director for [place] and [facility].

## 2014-12-16 ENCOUNTER — Encounter: Payer: Medicare Other | Admitting: *Deleted

## 2014-12-16 DIAGNOSIS — J4520 Mild intermittent asthma, uncomplicated: Secondary | ICD-10-CM

## 2014-12-16 DIAGNOSIS — G4730 Sleep apnea, unspecified: Secondary | ICD-10-CM

## 2014-12-16 LAB — GLUCOSE, CAPILLARY
GLUCOSE-CAPILLARY: 203 mg/dL — AB (ref 65–99)
GLUCOSE-CAPILLARY: 113 mg/dL — AB (ref 65–99)

## 2014-12-16 NOTE — Progress Notes (Signed)
Daily Session Note  Patient Details  Name: Daniqua Campoy MRN: [id number] Date of Birth: Jun 20, 1946 Referring Provider:  Steele Sizer, MD  Encounter Date: 12/16/2014  Check In:     Session Check In - 12/16/14 1201    Check-In   Staff Present Candiss Norse MS, ACSM CEP Exercise Physiologist;Carroll Enterkin RN, BSN;Stacey Blanch Media RRT, RCP Respiratory Therapist   ER physicians immediately available to respond to emergencies [facility] immediately available ER MD   Physician(s) Archie Balboa and Corky Downs   Medication changes reported     No   Fall or balance concerns reported    No   Warm-up and Cool-down Performed on first and last piece of equipment   VAD Patient? No   Pain Assessment   Currently in Pain? No/denies   Multiple Pain Sites No         Goals Met:  Proper associated with RPD/PD & O2 Sat Independence with exercise equipment Using PLB without cueing & demonstrates good technique Exercise tolerated well Strength training completed today  Goals Unmet:  Not Applicable  Goals Comments:   Dr. Emily Filbert is Medical Director for [place] and [facility].

## 2014-12-19 ENCOUNTER — Encounter: Payer: Medicare Other | Admitting: *Deleted

## 2014-12-19 DIAGNOSIS — G4730 Sleep apnea, unspecified: Secondary | ICD-10-CM

## 2014-12-19 DIAGNOSIS — G4733 Obstructive sleep apnea (adult) (pediatric): Secondary | ICD-10-CM

## 2014-12-19 DIAGNOSIS — J4520 Mild intermittent asthma, uncomplicated: Secondary | ICD-10-CM | POA: Diagnosis not present

## 2014-12-19 NOTE — Progress Notes (Signed)
Daily Session Note  Patient Details  Name: Mary Cantrell MRN: [id number] Date of Birth: 1946-05-28 Referring Provider:  Steele Sizer, MD  Encounter Date: 12/19/2014  Check In:     Session Check In - 12/19/14 1218    Check-In   Staff Present Carson Myrtle BS, RRT, Respiratory Therapist;Sigurd Pugh RN, BSN;Steven Way BS, ACSM EP-C, Exercise Physiologist   ER physicians immediately available to respond to emergencies [facility] immediately available ER MD   Physician(s) Dr. Edd Fabian and Dr. Reita Cliche   Medication changes reported     No   Fall or balance concerns reported    No   Warm-up and Cool-down Performed on first and last piece of equipment   VAD Patient? No   Pain Assessment   Currently in Pain? No/denies           Exercise Prescription Changes - 12/19/14 1200    Exercise Review   Progression Yes   Response to Exercise   Blood Pressure (Admit) 140/84 mmHg   Blood Pressure (Exercise) 148/80 mmHg   Blood Pressure (Exit) 130/76 mmHg   Heart Rate (Admit) 87 bpm   Heart Rate (Exercise) 92 bpm   Heart Rate (Exit) 76 bpm   Oxygen Saturation (Admit) 92 %   Oxygen Saturation (Exercise) 95 %   Oxygen Saturation (Exit) 92 %   Rating of Perceived Exertion (Exercise) 12   Perceived Dyspnea (Exercise) 3   Resistance Training   Training Prescription Yes   Weight 1   Reps 10-12   Treadmill   MPH 1.5   Grade 0   Minutes 15   Recumbant Bike   Level 1   RPM 40   Minutes 15  NuStep bothered Mary Cantrell's right heal, new goal RB   NuStep   Level 2   Watts 40   Minutes 15   REL-XR   Level 2   Watts 40   Minutes 10      Goals Met:  Proper associated with RPD/PD & O2 Sat Exercise tolerated well  Goals Unmet:  Not Applicable  Goals Comments:    Dr. Emily Filbert is Medical Director for [place] and [facility].

## 2014-12-21 ENCOUNTER — Encounter: Payer: Self-pay | Admitting: Family Medicine

## 2014-12-21 ENCOUNTER — Ambulatory Visit (INDEPENDENT_AMBULATORY_CARE_PROVIDER_SITE_OTHER): Payer: Medicare Other | Admitting: Family Medicine

## 2014-12-21 ENCOUNTER — Telehealth: Payer: Self-pay | Admitting: Internal Medicine

## 2014-12-21 VITALS — BP 126/72 | HR 93 | Temp 98.1°F | Resp 18 | Ht 67.0 in | Wt 209.0 lb

## 2014-12-21 DIAGNOSIS — M9979 Connective tissue and disc stenosis of intervertebral foramina of abdomen and other regions: Secondary | ICD-10-CM | POA: Insufficient documentation

## 2014-12-21 DIAGNOSIS — K589 Irritable bowel syndrome without diarrhea: Secondary | ICD-10-CM | POA: Insufficient documentation

## 2014-12-21 DIAGNOSIS — E1143 Type 2 diabetes mellitus with diabetic autonomic (poly)neuropathy: Secondary | ICD-10-CM | POA: Insufficient documentation

## 2014-12-21 DIAGNOSIS — K3184 Gastroparesis: Secondary | ICD-10-CM

## 2014-12-21 DIAGNOSIS — G8929 Other chronic pain: Secondary | ICD-10-CM | POA: Insufficient documentation

## 2014-12-21 DIAGNOSIS — G453 Amaurosis fugax: Secondary | ICD-10-CM | POA: Insufficient documentation

## 2014-12-21 DIAGNOSIS — I129 Hypertensive chronic kidney disease with stage 1 through stage 4 chronic kidney disease, or unspecified chronic kidney disease: Secondary | ICD-10-CM | POA: Insufficient documentation

## 2014-12-21 DIAGNOSIS — E1169 Type 2 diabetes mellitus with other specified complication: Secondary | ICD-10-CM | POA: Insufficient documentation

## 2014-12-21 DIAGNOSIS — S83209A Unspecified tear of unspecified meniscus, current injury, unspecified knee, initial encounter: Secondary | ICD-10-CM | POA: Insufficient documentation

## 2014-12-21 DIAGNOSIS — J45909 Unspecified asthma, uncomplicated: Secondary | ICD-10-CM | POA: Insufficient documentation

## 2014-12-21 DIAGNOSIS — T63461A Toxic effect of venom of wasps, accidental (unintentional), initial encounter: Secondary | ICD-10-CM | POA: Diagnosis not present

## 2014-12-21 DIAGNOSIS — L409 Psoriasis, unspecified: Secondary | ICD-10-CM | POA: Insufficient documentation

## 2014-12-21 DIAGNOSIS — E785 Hyperlipidemia, unspecified: Secondary | ICD-10-CM | POA: Insufficient documentation

## 2014-12-21 DIAGNOSIS — N183 Chronic kidney disease, stage 3 unspecified: Secondary | ICD-10-CM | POA: Insufficient documentation

## 2014-12-21 DIAGNOSIS — M549 Dorsalgia, unspecified: Secondary | ICD-10-CM

## 2014-12-21 DIAGNOSIS — I10 Essential (primary) hypertension: Secondary | ICD-10-CM

## 2014-12-21 DIAGNOSIS — K7581 Nonalcoholic steatohepatitis (NASH): Secondary | ICD-10-CM | POA: Insufficient documentation

## 2014-12-21 DIAGNOSIS — H359 Unspecified retinal disorder: Secondary | ICD-10-CM | POA: Insufficient documentation

## 2014-12-21 DIAGNOSIS — Z8639 Personal history of other endocrine, nutritional and metabolic disease: Secondary | ICD-10-CM | POA: Insufficient documentation

## 2014-12-21 DIAGNOSIS — E669 Obesity, unspecified: Secondary | ICD-10-CM | POA: Insufficient documentation

## 2014-12-21 DIAGNOSIS — M1990 Unspecified osteoarthritis, unspecified site: Secondary | ICD-10-CM | POA: Insufficient documentation

## 2014-12-21 DIAGNOSIS — S0300XA Dislocation of jaw, unspecified side, initial encounter: Secondary | ICD-10-CM | POA: Insufficient documentation

## 2014-12-21 DIAGNOSIS — H2510 Age-related nuclear cataract, unspecified eye: Secondary | ICD-10-CM | POA: Insufficient documentation

## 2014-12-21 MED ORDER — HYDROXYZINE HCL 25 MG PO TABS: 25.0000 mg | ORAL_TABLET | Freq: Three times a day (TID) | ORAL | Status: DC | PRN

## 2014-12-21 MED ORDER — LORATADINE 10 MG PO TABS: 10.0000 mg | ORAL_TABLET | Freq: Every day | ORAL | Status: DC

## 2014-12-21 MED ORDER — RANITIDINE HCL 150 MG PO TABS: 150.0000 mg | ORAL_TABLET | Freq: Two times a day (BID) | ORAL | Status: DC

## 2014-12-21 NOTE — Telephone Encounter (Signed)
Ms Weir called and is having feet swelling from wasp bites. She plans to call her physician.

## 2014-12-21 NOTE — Progress Notes (Signed)
Name: Mary Cantrell   MRN: [id number]    DOB: [birth year]   Date:12/21/2014       Progress Note  Subjective  Chief Complaint  Chief Complaint  Patient presents with  . Insect Bite    Onset Thursday was stung on bilateral ankles and legs. Swollen, burning sensation and skin discoloration. Has been taking Benadryl and Hydrocortisone cream on site.    HPI  She was stung by multiple wasps on her legs five days ago.  She developed localized swelling, tenderness and itching. She is taking Benadryl and topical cortisone with no improvement. No problems breathing , no hives, no chest pain  Patient Active Problem List   Diagnosis Date Noted  . Benign essential HTN 12/21/2014  . Back pain, chronic 12/21/2014  . Narrowing of intervertebral disc space 12/21/2014  . Diabetic gastroparesis 12/21/2014  . Amaurosis fugax of left eye 12/21/2014  . H/O: hypothyroidism 12/21/2014  . Hyperlipidemia 12/21/2014  . Adaptive colitis 12/21/2014  . Disorder of macula of retina 12/21/2014  . Asthma, moderate 12/21/2014  . NASH (nonalcoholic steatohepatitis) 12/21/2014  . NS (nuclear sclerosis) 12/21/2014  . Osteoarthrosis 12/21/2014  . Obesity (BMI 30-39.9) 12/21/2014  . Psoriasis 12/21/2014  . Knee torn cartilage 12/21/2014  . Closed dislocation of jaw 12/21/2014  . Diabetes mellitus, type 2 12/21/2014  . Obstructive apnea 01/03/2014  . History of shoulder surgery 04/23/2011    History  Substance Use Topics  . Smoking status: Never Smoker   . Smokeless tobacco: Never Used  . Alcohol Use: No     Current outpatient prescriptions:  .  albuterol (VENTOLIN HFA) 108 (90 BASE) MCG/ACT inhaler, Inhale into the lungs., Disp: , Rfl:  .  aspirin 81 MG tablet, Take 81 mg by mouth daily., Disp: , Rfl:  .  budesonide-formoterol (SYMBICORT) 160-4.5 MCG/ACT inhaler, Inhale into the lungs., Disp: , Rfl:  .  Cinnamon 500 MG capsule, Take by mouth., Disp: , Rfl:  .  diphenhydrAMINE (BENADRYL) 25 MG tablet, Take  1 tablet by mouth daily., Disp: , Rfl:  .  fluticasone (FLONASE) 50 MCG/ACT nasal spray, Place into the nose., Disp: , Rfl:  .  gabapentin (NEURONTIN) 100 MG capsule, Take 1 capsule (100 mg total) by mouth 3 (three) times daily., Disp: 180 capsule, Rfl: 4 .  glimepiride (AMARYL) 2 MG tablet, Take by mouth., Disp: , Rfl:  .  glucose blood test strip, 1 each by Other route daily., Disp: , Rfl:  .  losartan (COZAAR) 50 MG tablet, TAKE 1 TABLET DAILY FOR BLOOD PRESSURE, Disp: 90 tablet, Rfl: 0 .  meloxicam (MOBIC) 7.5 MG tablet, Take by mouth., Disp: , Rfl:  .  metFORMIN (GLUCOPHAGE-XR) 500 MG 24 hr tablet, One in am and 2 in pm, Disp: 90 tablet, Rfl: 2 .  pravastatin (PRAVACHOL) 40 MG tablet, Take by mouth., Disp: , Rfl:  .  ranitidine (ZANTAC) 150 MG tablet, Take 1 tablet (150 mg total) by mouth 2 (two) times daily., Disp: 60 tablet, Rfl: 0 .  spironolactone-hydrochlorothiazide (ALDACTAZIDE) 25-25 MG per tablet, Take by mouth., Disp: , Rfl:  .  vitamin C (ASCORBIC ACID) 500 MG tablet, Take 1 tablet by mouth daily., Disp: , Rfl:  .  hydrOXYzine (ATARAX/VISTARIL) 25 MG tablet, Take 1 tablet (25 mg total) by mouth 3 (three) times daily as needed., Disp: 30 tablet, Rfl: 0 .  loratadine (CLARITIN) 10 MG tablet, Take 1 tablet (10 mg total) by mouth daily., Disp: 60 tablet, Rfl: 0  Allergies  Allergen Reactions  .  Codeine     Other reaction(s): Other (See Comments) Dizzy  . Lantus  [Insulin Glargine]     made skin hot  . Metformin   . Sitagliptin   . Sulfa Antibiotics     Other reaction(s): Other (See Comments) Red burning skin    ROS Constitutional: Negative for fever or weight change.  Respiratory: Negative for cough and shortness of breath.   Cardiovascular: Negative for chest pain or palpitations.  Gastrointestinal: Negative for abdominal pain, no bowel changes.  Musculoskeletal: Negative for gait problem or joint swelling.  Skin: positive  for rash - wasp sting.  Neurological:  Negative for dizziness or headache.  No other specific complaints in a complete review of systems (except as listed in HPI above).  Objective  Filed Vitals:   12/21/14 1231  BP: 126/72  Pulse: 93  Temp: 98.1 F (36.7 C)  TempSrc: Oral  Resp: 18  Height: 5' 7"  (1.702 m)  Weight: 209 lb (94.802 kg)  SpO2: 93%    Body mass index is 32.73 kg/(m^2).    Physical Exam  Constitutional: Patient appears well-developed and well-nourished. No distress.  Eyes:  No scleral icterus.  Neck: Normal range of motion. Neck supple. Cardiovascular: Normal rate, regular rhythm and normal heart sounds.  No murmur heard. No BLE edema. Pulmonary/Chest: Effort normal and breath sounds normal. No respiratory distress. Abdominal: Soft.  There is no tenderness. Psychiatric: Patient has a normal mood and affect. behavior is normal. Judgment and thought content normal. Skin: multiple areas of erythema, pain and swelling on lower legs  Recent Results (from the past 2160 hour(s))  Glucose, capillary     Status: Abnormal   Collection Time: 12/12/14  1:02 PM  Result Value Ref Range   Glucose-Capillary 117 (H) 65 - 99 mg/dL  Glucose, capillary     Status: Abnormal   Collection Time: 12/14/14 11:40 AM  Result Value Ref Range   Glucose-Capillary 218 (H) 65 - 99 mg/dL  Glucose, capillary     Status: Abnormal   Collection Time: 12/14/14 12:39 PM  Result Value Ref Range   Glucose-Capillary 159 (H) 65 - 99 mg/dL  Glucose, capillary     Status: Abnormal   Collection Time: 12/16/14 12:05 PM  Result Value Ref Range   Glucose-Capillary 203 (H) 65 - 99 mg/dL  Glucose, capillary     Status: Abnormal   Collection Time: 12/16/14  1:11 PM  Result Value Ref Range   Glucose-Capillary 113 (H) 65 - 99 mg/dL     Assessment & Plan  1. Sting, wasp, accidental or unintentional, initial encounter Call back if no improvement. Cold compresses - ranitidine (ZANTAC) 150 MG tablet; Take 1 tablet (150 mg total) by mouth 2  (two) times daily.  Dispense: 60 tablet; Refill: 0 - loratadine (CLARITIN) 10 MG tablet; Take 1 tablet (10 mg total) by mouth daily.  Dispense: 60 tablet; Refill: 0 - hydrOXYzine (ATARAX/VISTARIL) 25 MG tablet; Take 1 tablet (25 mg total) by mouth 3 (three) times daily as needed.  Dispense: 30 tablet; Refill: 0

## 2014-12-22 ENCOUNTER — Ambulatory Visit: Payer: Self-pay | Admitting: Family Medicine

## 2014-12-23 ENCOUNTER — Other Ambulatory Visit: Payer: Self-pay | Admitting: Family Medicine

## 2014-12-23 ENCOUNTER — Encounter: Payer: Medicare Other | Attending: Specialist

## 2014-12-23 DIAGNOSIS — G4730 Sleep apnea, unspecified: Secondary | ICD-10-CM | POA: Insufficient documentation

## 2014-12-23 DIAGNOSIS — J4520 Mild intermittent asthma, uncomplicated: Secondary | ICD-10-CM | POA: Insufficient documentation

## 2014-12-23 MED ORDER — GLIPIZIDE ER 5 MG PO TB24: 5.0000 mg | ORAL_TABLET | Freq: Every day | ORAL | Status: DC

## 2014-12-23 NOTE — Telephone Encounter (Signed)
Patient requesting refill. 

## 2014-12-23 NOTE — Telephone Encounter (Signed)
Why is she taking 2 Amaryl 40m?  I think it will be best to change to a 4 mg pill twice daily . Please verify with patient.

## 2014-12-28 ENCOUNTER — Encounter: Payer: Medicare Other | Admitting: *Deleted

## 2014-12-28 DIAGNOSIS — J4520 Mild intermittent asthma, uncomplicated: Secondary | ICD-10-CM | POA: Diagnosis not present

## 2014-12-28 DIAGNOSIS — G4730 Sleep apnea, unspecified: Secondary | ICD-10-CM

## 2014-12-28 NOTE — Progress Notes (Signed)
Daily Session Note  Patient Details  Name: Tashawn Greff MRN: [id number] Date of Birth: April 10, 1946 Referring Provider:  Steele Sizer, MD  Encounter Date: 12/28/2014  Check In:     Session Check In - 12/28/14 1154    Check-In   Staff Present Carson Myrtle BS, RRT, Respiratory Therapist;Jaleesa Cervi Dillard Essex MS, ACSM CEP Exercise Physiologist;Carroll Enterkin RN, BSN;Steven Way BS, ACSM EP-C, Exercise Physiologist   ER physicians immediately available to respond to emergencies [facility] immediately available ER MD   Physician(s) Joni Fears and Thomasene Lot   Medication changes reported     No   Fall or balance concerns reported    No   Warm-up and Cool-down Performed on first and last piece of equipment   VAD Patient? No   Pain Assessment   Currently in Pain? No/denies   Multiple Pain Sites No         Goals Met:  Proper associated with RPD/PD & O2 Sat Independence with exercise equipment Using PLB without cueing & demonstrates good technique Exercise tolerated well Strength training completed today  Goals Unmet:  Not Applicable  Goals Comments:    Dr. Emily Filbert is Medical Director for [facility] and [facility].

## 2014-12-28 NOTE — Progress Notes (Signed)
Daily Session Note  Patient Details  Name: Mary Cantrell MRN: [id number] Date of Birth: June 04, 1946 Referring Provider:  Erby Pian, MD  Encounter Date: 12/28/2014  Check In:     Session Check In - 12/28/14 1154    Check-In   Staff Present Laureen Owens Shark BS, RRT, Respiratory Therapist;Renee Dillard Essex Mary, ACSM CEP Exercise Physiologist;Carroll Enterkin RN, BSN;Steven Way BS, ACSM EP-C, Exercise Physiologist   ER physicians immediately available to respond to emergencies [facility] immediately available ER MD   Physician(s) Joni Fears and Thomasene Lot   Medication changes reported     No   Fall or balance concerns reported    No   Warm-up and Cool-down Performed on first and last piece of equipment   VAD Patient? No   Pain Assessment   Currently in Pain? No/denies   Multiple Pain Sites No         Goals Met:  Proper associated with RPD/PD & O2 Sat Independence with exercise equipment Using PLB without cueing & demonstrates good technique Exercise tolerated well Strength training completed today  Goals Unmet:  Not Applicable  Goals Comments: Mary Cantrell's feet are doing better from wasp bites; she was able to perform her exercise goals; started on a antihistamine for treatment.   Dr. Emily Filbert is Medical Director for [place] and [facility].

## 2014-12-30 ENCOUNTER — Encounter: Payer: Medicare Other | Admitting: *Deleted

## 2014-12-30 DIAGNOSIS — J4520 Mild intermittent asthma, uncomplicated: Secondary | ICD-10-CM

## 2014-12-30 DIAGNOSIS — G4730 Sleep apnea, unspecified: Secondary | ICD-10-CM

## 2014-12-30 NOTE — Progress Notes (Signed)
Daily Session Note  Patient Details  Name: Mary Cantrell MRN: [id number] Date of Birth: [birth year] Referring Provider:  Steele Sizer, MD  Encounter Date: 12/30/2014  Check In:     Session Check In - 12/30/14 1227    Check-In   Staff Present Frederich Cha RRT, RCP Respiratory Therapist;Aubriee Szeto Dillard Essex MS, ACSM CEP Exercise Physiologist   ER physicians immediately available to respond to emergencies [facility] immediately available ER MD   Physician(s) Kerman Passey and Lord   Medication changes reported     No   Fall or balance concerns reported    No   Warm-up and Cool-down Performed on first and last piece of equipment   VAD Patient? No   Pain Assessment   Currently in Pain? No/denies   Multiple Pain Sites No         Goals Met:  Proper associated with RPD/PD & O2 Sat Independence with exercise equipment Using PLB without cueing & demonstrates good technique Exercise tolerated well Strength training completed today  Goals Unmet:  Not Applicable  Goals Comments:    Dr. Emily Filbert is Medical Director for [facility] and [facility].

## 2015-01-02 DIAGNOSIS — J45901 Unspecified asthma with (acute) exacerbation: Secondary | ICD-10-CM

## 2015-01-02 DIAGNOSIS — J4520 Mild intermittent asthma, uncomplicated: Secondary | ICD-10-CM | POA: Diagnosis not present

## 2015-01-02 DIAGNOSIS — G4733 Obstructive sleep apnea (adult) (pediatric): Secondary | ICD-10-CM

## 2015-01-02 NOTE — Progress Notes (Signed)
Daily Session Note  Patient Details  Name: Mary Cantrell MRN: [id number] Date of Birth: Aug 17, 1945 Referring Provider:  Steele Sizer, MD  Encounter Date: 01/02/2015  Check In:     Session Check In - 01/02/15 1218    Check-In   Staff Present Lestine Box BS, ACSM EP-C, Exercise Physiologist;Laureen Janell Quiet, RRT, Respiratory Therapist   ER physicians immediately available to respond to emergencies [facility] immediately available ER MD   Physician(s) goodman and williams   Medication changes reported     No   Fall or balance concerns reported    No   Warm-up and Cool-down Performed on first and last piece of equipment   VAD Patient? No   Pain Assessment   Currently in Pain? No/denies         Goals Met:  Proper associated with RPD/PD & O2 Sat Exercise tolerated well No report of cardiac concerns or symptoms Strength training completed today  Goals Unmet:  Not Applicable  Goals Comments:    Dr. Emily Filbert is Medical Director for [place] and [facility].

## 2015-01-04 DIAGNOSIS — G4733 Obstructive sleep apnea (adult) (pediatric): Secondary | ICD-10-CM

## 2015-01-04 DIAGNOSIS — J4520 Mild intermittent asthma, uncomplicated: Secondary | ICD-10-CM | POA: Diagnosis not present

## 2015-01-04 NOTE — Progress Notes (Signed)
Daily Session Note  Patient Details  Name: Mary Cantrell MRN: [id number] Date of Birth: 07-31-1945 Referring Provider:  Steele Sizer, MD  Encounter Date: 01/04/2015  Check In:        Exercise Prescription Changes - 01/04/15 1400    Exercise Review   Progression Yes   Response to Exercise   Blood Pressure (Admit) 120/64 mmHg   Blood Pressure (Exercise) 140/70 mmHg   Blood Pressure (Exit) 124/80 mmHg   Heart Rate (Admit) 64 bpm   Heart Rate (Exercise) 99 bpm   Heart Rate (Exit) 85 bpm   Oxygen Saturation (Admit) 92 %   Oxygen Saturation (Exercise) 94 %   Oxygen Saturation (Exit) 95 %   Rating of Perceived Exertion (Exercise) 12   Perceived Dyspnea (Exercise) 4   Resistance Training   Training Prescription Yes   Weight 1   Reps 10-12   Treadmill   MPH 1.9   Grade 0   Minutes 15   Recumbant Bike   Level 3   RPM 50   Watts 15   REL-XR   Level 3   Watts 50   Minutes 10      Goals Met:  Proper associated with RPD/PD & O2 Sat Independence with exercise equipment Using PLB without cueing & demonstrates good technique Exercise tolerated well Strength training completed today  Goals Unmet:  Not Applicable  Goals Comments: Ms Chastain plan to buy a treadmill for home use.   Dr. Emily Filbert is Medical Director for [facility] and [facility].

## 2015-01-05 ENCOUNTER — Other Ambulatory Visit: Payer: Self-pay | Admitting: Family Medicine

## 2015-01-05 ENCOUNTER — Encounter: Payer: Self-pay | Admitting: Family Medicine

## 2015-01-06 ENCOUNTER — Encounter: Payer: Medicare Other | Admitting: *Deleted

## 2015-01-06 DIAGNOSIS — J4520 Mild intermittent asthma, uncomplicated: Secondary | ICD-10-CM

## 2015-01-06 DIAGNOSIS — G4730 Sleep apnea, unspecified: Secondary | ICD-10-CM

## 2015-01-06 NOTE — Progress Notes (Signed)
Daily Session Note  Patient Details  Name: Mary Cantrell MRN: [id number] Date of Birth: [birth year] Referring Provider:  Steele Sizer, MD  Encounter Date: 01/06/2015  Check In:     Session Check In - 01/06/15 1225    Check-In   Staff Present Frederich Cha RRT, RCP Respiratory Therapist;Ukiah Trawick Dillard Essex MS, ACSM CEP Exercise Physiologist;Carroll Enterkin RN, BSN   ER physicians immediately available to respond to emergencies [facility] immediately available ER MD   Physician(s) Edd Fabian and Joni Fears   Medication changes reported     No   Fall or balance concerns reported    No   Warm-up and Cool-down Performed on first and last piece of equipment   VAD Patient? No   Pain Assessment   Currently in Pain? No/denies   Multiple Pain Sites No           Exercise Prescription Changes - 01/06/15 1200    Exercise Review   Progression Yes   Response to Exercise   Blood Pressure (Admit) 120/64 mmHg   Blood Pressure (Exercise) 140/70 mmHg   Blood Pressure (Exit) 124/80 mmHg   Heart Rate (Admit) 64 bpm   Heart Rate (Exercise) 99 bpm   Heart Rate (Exit) 85 bpm   Oxygen Saturation (Admit) 92 %   Oxygen Saturation (Exercise) 94 %   Oxygen Saturation (Exit) 95 %   Rating of Perceived Exertion (Exercise) 12   Perceived Dyspnea (Exercise) 4   Resistance Training   Training Prescription Yes   Weight 1   Reps 10-12   Treadmill   MPH 1.9   Grade 0   Minutes 15   Recumbant Bike   Level 3   RPM 50   Watts 15   REL-XR   Level 3   Watts 60   Minutes 15      Goals Met:  Proper associated with RPD/PD & O2 Sat Independence with exercise equipment Using PLB without cueing & demonstrates good technique Exercise tolerated well Personal goals reviewed Strength training completed today  Goals Unmet:  Not Applicable  Goals Comments: Mary Cantrell is progressing well on the aerobic equipment and today we discussed an increase on the REL XR with her. She says she is enjoying the program and is  encouraged by the increases.    Dr. Emily Filbert is Medical Director for [place] and [facility].

## 2015-01-09 DIAGNOSIS — G4730 Sleep apnea, unspecified: Secondary | ICD-10-CM

## 2015-01-09 DIAGNOSIS — J4520 Mild intermittent asthma, uncomplicated: Secondary | ICD-10-CM | POA: Diagnosis not present

## 2015-01-09 NOTE — Progress Notes (Signed)
Pulmonary Individual Treatment Plan  Patient Details  Name: Mary Cantrell MRN: [id number] Date of Birth: Nov 07, 1945 Referring Provider:  Steele Sizer, MD  Initial Encounter Date:  12/06/2014  Visit Diagnosis: Asthma in adult, mild intermittent, uncomplicated  Sleep apnea  Patient's Home Medications on Admission:  Current outpatient prescriptions:  .  albuterol (VENTOLIN HFA) 108 (90 BASE) MCG/ACT inhaler, Inhale into the lungs., Disp: , Rfl:  .  aspirin 81 MG tablet, Take 81 mg by mouth daily., Disp: , Rfl:  .  budesonide-formoterol (SYMBICORT) 160-4.5 MCG/ACT inhaler, Inhale into the lungs., Disp: , Rfl:  .  Cinnamon 500 MG capsule, Take by mouth., Disp: , Rfl:  .  diphenhydrAMINE (BENADRYL) 25 MG tablet, Take 1 tablet by mouth daily., Disp: , Rfl:  .  fluticasone (FLONASE) 50 MCG/ACT nasal spray, Place into the nose., Disp: , Rfl:  .  gabapentin (NEURONTIN) 100 MG capsule, Take 1 capsule (100 mg total) by mouth 3 (three) times daily., Disp: 180 capsule, Rfl: 4 .  glipiZIDE (GLIPIZIDE XL) 5 MG 24 hr tablet, Take 1 tablet (5 mg total) by mouth daily with breakfast., Disp: 90 tablet, Rfl: 0 .  hydrOXYzine (ATARAX/VISTARIL) 25 MG tablet, Take 1 tablet (25 mg total) by mouth 3 (three) times daily as needed., Disp: 30 tablet, Rfl: 0 .  loratadine (CLARITIN) 10 MG tablet, Take 1 tablet (10 mg total) by mouth daily., Disp: 60 tablet, Rfl: 0 .  losartan (COZAAR) 50 MG tablet, TAKE 1 TABLET DAILY FOR BLOOD PRESSURE, Disp: 90 tablet, Rfl: 0 .  meloxicam (MOBIC) 7.5 MG tablet, Take by mouth., Disp: , Rfl:  .  metFORMIN (GLUCOPHAGE-XR) 500 MG 24 hr tablet, One in am and 2 in pm, Disp: 90 tablet, Rfl: 2 .  ONE TOUCH ULTRA TEST test strip, TEST TWICE A DAY AS DIRECTED -- DX 250.00, Disp: 100 each, Rfl: 2 .  pravastatin (PRAVACHOL) 40 MG tablet, Take by mouth., Disp: , Rfl:  .  ranitidine (ZANTAC) 150 MG tablet, Take 1 tablet (150 mg total) by mouth 2 (two) times daily., Disp: 60 tablet, Rfl: 0 .   spironolactone-hydrochlorothiazide (ALDACTAZIDE) 25-25 MG per tablet, TAKE ONE-HALF (1/2) TO ONE TABLET DAILY FOR BLOOD PRESSURE, Disp: 90 tablet, Rfl: 1 .  vitamin C (ASCORBIC ACID) 500 MG tablet, Take 1 tablet by mouth daily., Disp: , Rfl:   Past Medical History: No past medical history on file.  Tobacco Use: History  Smoking status  . Never Smoker   Smokeless tobacco  . Never Used    Labs: Recent Review Flowsheet Data    There is no flowsheet data to display.         POCT Glucose      12/12/14 1130 12/14/14 1130 12/16/14 1442       POCT Blood Glucose   Pre-Exercise 193 mg/dL       Post-Exercise 117 mg/dL       Pre-Exercise #2  218 mg/dL      Post-Exercise #2  159 mg/dL      Pre-Exercise #3   203 mg/dL     Post-Exercise #3   113 mg/dL        ADL UCSD:     ADL UCSD      12/06/14 1130       ADL UCSD   ADL Phase Entry     SOB Score total 73     Rest 2     Walk 2     Stairs 4     Bath  3     Dress 2     Shop 4         Pulmonary Function Assessment:     Pulmonary Function Assessment - 12/06/14 1130    Initial Spirometry Results   FVC% 84 %   FEV1% 88 %   FEV1/FVC Ratio 83.25      Exercise Target Goals:    Exercise Program Goal: Individual exercise prescription set with THRR, safety & activity barriers. Participant demonstrates ability to understand and report RPE using BORG scale, to self-measure pulse accurately, and to acknowledge the importance of the exercise prescription.  Exercise Prescription Goal: Starting with aerobic activity 30 plus minutes a day, 3 days per week for initial exercise prescription. Provide home exercise prescription and guidelines that participant acknowledges understanding prior to discharge.  Activity Barriers & Risk Stratification:     Activity Barriers & Risk Stratification - 12/06/14 1130    Activity Barriers & Risk Stratification   Activity Barriers Shortness of Breath;Deconditioning;Balance Concerns   Risk  Stratification Moderate      6 Minute Walk:     6 Minute Walk      12/06/14 1240       6 Minute Walk   Phase Initial     Distance 1320 feet     Walk Time 6 minutes     Resting HR 78 bpm     Resting BP 118/74 mmHg     Max Ex. HR 84 bpm     Max Ex. BP 132/84 mmHg     RPE 13     Perceived Dyspnea  3     Symptoms No        Initial Exercise Prescription:     Initial Exercise Prescription - 12/06/14 1700    Date of Initial Exercise Prescription   Date 12/06/14   Treadmill   MPH 1.5   Grade 0   Minutes 10   Recumbant Bike   Level 1   RPM 40   Watts 20   Minutes 10   NuStep   Level 4   Watts 40   Minutes 10   Arm Ergometer   Level 1   Watts 10   Minutes 10   Arm/Foot Ergometer   Level 1   Watts 12   Minutes 10   Recumbant Elliptical   Level 1   RPM 40   Watts 40   Minutes 10   Elliptical   Level 1   Speed 3   Minutes 1   REL-XR   Level 1   Watts 40   Minutes 10   Prescription Details   Frequency (times per week) 3   Duration Progress to 30 minutes of continuous aerobic without signs/symptoms of physical distress   Intensity   THRR REST +  30   Ratings of Perceived Exertion 11-15   Perceived Dyspnea 2-4   Progression Continue progressive overload as per policy without signs/symptoms or physical distress.   Resistance Training   Training Prescription Yes   Weight 1   Reps 10-12      Exercise Prescription Changes:     Exercise Prescription Changes      12/12/14 1400 12/14/14 1400 12/19/14 1200 01/03/15 0800 01/04/15 1400   Exercise Review   Progression   Yes Yes Yes   Response to Exercise   Blood Pressure (Admit) 140/84 mmHg 140/84 mmHg 140/84 mmHg 130/82 mmHg  Entry for 01/02/2015 120/64 mmHg   Blood Pressure (Exercise) 148/80 mmHg 148/80 mmHg 148/80  mmHg 140/80 mmHg 140/70 mmHg   Blood Pressure (Exit) 130/76 mmHg 130/76 mmHg 130/76 mmHg 122/80 mmHg 124/80 mmHg   Heart Rate (Admit) 87 bpm 87 bpm 87 bpm 82 bpm 64 bpm   Heart Rate  (Exercise) 92 bpm 92 bpm 92 bpm 96 bpm 99 bpm   Heart Rate (Exit) 76 bpm 76 bpm 76 bpm 80 bpm 85 bpm   Oxygen Saturation (Admit) 92 % 92 % 92 % 95 % 92 %   Oxygen Saturation (Exercise) 95 % 95 % 95 % 95 % 94 %   Oxygen Saturation (Exit) 92 % 92 % 92 % 95 % 95 %   Rating of Perceived Exertion (Exercise) _0 Perceived Dyspnea (Exercise) _1 Resistance Training   Training Prescription _2    Weight _3 Reps 10-12 10-12 10-12 10-12 10-12   Treadmill   MPH 1.5 1.5 1.5 1.7 1.9   Grade 0 0 0 0 0   Minutes _4 Recumbant Bike   Level  _5 RPM  40 40 40 50   Watts     15   Minutes  15  NuStep bothered Ms Wilber's right heal, new goal RB 15  NuStep bothered Ms Tomich's right heal, new goal RB 15  NuStep bothered Ms Carre's right heal, new goal RB    NuStep   Level _6 Watts 40 40 40 40    Minutes _7 REL-XR   Level _8 Watts 40 40 40 50 50   Minutes _9 01/06/15 1200 01/09/15 1500         Exercise Review   Progression Yes Yes      Response to Exercise   Blood Pressure (Admit) 120/64 mmHg 120/64 mmHg      Blood Pressure (Exercise) 140/70 mmHg 140/70 mmHg      Blood Pressure (Exit) 124/80 mmHg 124/80 mmHg      Heart Rate (Admit) 64 bpm 64 bpm      Heart Rate (Exercise) 99 bpm 99 bpm      Heart Rate (Exit) 85 bpm 85 bpm      Oxygen Saturation (Admit) 92 % 92 %      Oxygen Saturation (Exercise) 94 % 94 %      Oxygen Saturation (Exit) 95 % 95 %      Rating of Perceived Exertion (Exercise) 12 12      Perceived Dyspnea (Exercise) 4 4      Resistance Training   Training Prescription Yes Yes      Weight 1 1      Reps 10-12 10-12      Treadmill   MPH 1.9 2.2      Grade 0 0      Minutes 15 15      Recumbant Bike   Level 3       RPM 50       Watts 15       NuStep   Level  3      Watts  50      Minutes  15      REL-XR   Level 3 3      Watts 60 60  Minutes 15 15          Discharge Exercise Prescription (Final Exercise Prescription Changes):     Exercise Prescription Changes - 01/09/15 1500    Exercise Review   Progression Yes   Response to Exercise   Blood Pressure (Admit) 120/64 mmHg   Blood Pressure (Exercise) 140/70 mmHg   Blood Pressure (Exit) 124/80 mmHg   Heart Rate (Admit) 64 bpm   Heart Rate (Exercise) 99 bpm   Heart Rate (Exit) 85 bpm   Oxygen Saturation (Admit) 92 %   Oxygen Saturation (Exercise) 94 %   Oxygen Saturation (Exit) 95 %   Rating of Perceived Exertion (Exercise) 12   Perceived Dyspnea (Exercise) 4   Resistance Training   Training Prescription Yes   Weight 1   Reps 10-12   Treadmill   MPH 2.2   Grade 0   Minutes 15   NuStep   Level 3   Watts 50   Minutes 15   REL-XR   Level 3   Watts 60   Minutes 15       Nutrition:  Target Goals: Understanding of nutrition guidelines, daily intake of sodium <1567m, cholesterol <2020m calories 30% from fat and 7% or less from saturated fats, daily to have 5 or more servings of fruits and vegetables.  Biometrics:    Nutrition Therapy Plan and Nutrition Goals:     Nutrition Therapy & Goals - 12/06/14 1130    Nutrition Therapy   Diet Ms MuSherrinrefers not to meet with the dietitian; she states she known what to do; she eats out mostly, but does eat only half and takes the rest to go home; she would like to loss 20lbs      Nutrition Discharge: Rate Your Plate Scores:   Psychosocial: Target Goals: Acknowledge presence or absence of depression, maximize coping skills, provide positive support system. Participant is able to verbalize types and ability to use techniques and skills needed for reducing stress and depression.  Initial Review & Psychosocial Screening:     Initial Psych Review & Screening - 12/06/14 1130    Family Dynamics   Good Support System? Yes   Comments Her husband and 2 children are very supportive; her sister lives close and is supportive.    Barriers   Psychosocial barriers to participate in program There are no identifiable barriers or psychosocial needs.;The patient should benefit from training in stress management and relaxation.   Screening Interventions   Interventions Encouraged to exercise;Program counselor consult      Quality of Life Scores:     Quality of Life - 12/06/14 1130    Quality of Life Scores   Health/Function Pre 16.59 %   Socioeconomic Pre 23.93 %   Psych/Spiritual Pre 24 %   Family Pre 26.1 %   GLOBAL Pre 20.81 %      PHQ-9:     Recent Review Flowsheet Data    [facility] 12/21/2014 12/06/2014   Decreased Interest 0 0   Down, Depressed, Hopeless 0 0   PHQ - 2 Score 0 0      Psychosocial Evaluation and Intervention:     Psychosocial Evaluation - 12/12/14 1254    Psychosocial Evaluation & Interventions   Comments Counselor met with Ms. Shakir for initial psychosocial evaluation.   She is a [age]ear old female who has asthma, sleep apnea and type 2 diabetes.  She has a strong support system with a spouse of [phone].  Ms.  Hone states she has been sleeping better since she began using a CPAP machine.  Her appetite is "okay" and she reports no current symptoms of depression or anxiety, although there was a history of depression "many years ago."  Ms. Brilliant states that her health issues and subsequent limitations are stressful for her making traveling and gardening difficult.  She presents in a positive mood and has goals to increase her stamina, strength, and be able to breathe better.  Counselor encouraged her to consistently exercise to accomplish these goals.          Psychosocial Re-Evaluation:     Psychosocial Re-Evaluation      01/04/15 1037           Psychosocial Re-Evaluation   Comments Counselor met with Ms. Breaker today for a follow up.  She reports being stronger and having more energy to garden and do those things that she enjoys since beginning this program.   She is working hard on her goals and it appears to be paying off for Ms. Walrath.           Education: Education Goals: Education classes will be provided on a weekly basis, covering required topics. Participant will state understanding/return demonstration of topics presented.  Learning Barriers/Preferences:     Learning Barriers/Preferences - 12/06/14 1130    Learning Barriers/Preferences   Learning Barriers None   Learning Preferences Group Instruction;Individual Instruction;Pictoral;Skilled Demonstration;Verbal Instruction;Video;Written Material      Education Topics: Initial Evaluation Education: - Verbal, written and demonstration of respiratory meds, RPE/PD scales, oximetry and breathing techniques. Instruction on use of nebulizers and MDIs: cleaning and proper use, rinsing mouth with steroid doses and importance of monitoring MDI activations.          Pulmonary Rehab from 01/06/2015 in [place]   Date  12/06/14   Educator  LB   Instruction Review Code  2- meets goals/outcomes      General Nutrition Guidelines/Fats and Fiber: -Group instruction provided by verbal, written material, models and posters to present the general guidelines for heart healthy nutrition. Gives an explanation and review of dietary fats and fiber.      Pulmonary Rehab from 01/06/2015 in [place]   Date  12/19/14   Educator  Karolee Stamps, RD   Instruction Review Code  2- meets goals/outcomes      Controlling Sodium/Reading Food Labels: -Group verbal and written material supporting the discussion of sodium use in heart healthy nutrition. Review and explanation with models, verbal and written materials for utilization of the food label.   Exercise Physiology & Risk Factors: - Group verbal and written instruction with models to review the exercise physiology of the cardiovascular system and associated critical values.  Details cardiovascular disease risk factors and the goals associated with each risk factor.   Aerobic Exercise & Resistance Training: - Gives group verbal and written discussion on the health impact of inactivity. On the components of aerobic and resistive training programs and the benefits of this training and how to safely progress through these programs.      Pulmonary Rehab from 01/06/2015 in [place]   Date  12/28/14   Educator  SW   Instruction Review Code  2- meets goals/outcomes      Flexibility, Balance, General Exercise Guidelines: - Provides group verbal and written instruction on the benefits of flexibility and balance training programs. Provides general exercise guidelines with specific guidelines to  those with heart or lung disease. Demonstration and skill practice provided.   Stress Management: - Provides group verbal and written instruction about the health risks of elevated stress, cause of high stress, and healthy ways to reduce stress.   Depression: - Provides group verbal and written instruction on the correlation between heart/lung disease and depressed mood, treatment options, and the stigmas associated with seeking treatment.   Exercise & Equipment Safety: - Individual verbal instruction and demonstration of equipment use and safety with use of the equipment.      Pulmonary Rehab from 01/06/2015 in [place]   Date  12/12/14   Educator  L. Owens Shark, RT   Instruction Review Code  2- meets goals/outcomes      Infection Prevention: - Provides verbal and written material to individual with discussion of infection control including proper hand washing and proper equipment cleaning during exercise session.      Pulmonary Rehab from 01/06/2015 in [place]   Date  12/12/14   Educator  LB   Instruction Review Code  2- meets goals/outcomes      Falls  Prevention: - Provides verbal and written material to individual with discussion of falls prevention and safety.   Diabetes: - Individual verbal and written instruction to review signs/symptoms of diabetes, desired ranges of glucose level fasting, after meals and with exercise. Advice that pre and post exercise glucose checks will be done for 3 sessions at entry of program.   Chronic Lung Diseases: - Group verbal and written instruction to review new updates, new respiratory medications, new advancements in procedures and treatments. Provide informative websites and "800" numbers of self-education.   Lung Procedures: - Group verbal and written instruction to describe testing methods done to diagnose lung disease. Review the outcome of test results. Describe the treatment choices: Pulmonary Function Tests, ABGs and oximetry.   Energy Conservation: - Provide group verbal and written instruction for methods to conserve energy, plan and organize activities. Instruct on pacing techniques, use of adaptive equipment and posture/positioning to relieve shortness of breath.   Triggers: - Group verbal and written instruction to review types of environmental controls: home humidity, furnaces, filters, dust mite/pet prevention, HEPA vacuums. To discuss weather changes, air quality and the benefits of nasal washing.      Pulmonary Rehab from 01/06/2015 in [place]   Date  01/02/15   Educator  LB   Instruction Review Code  2- meets goals/outcomes      Exacerbations: - Group verbal and written instruction to provide: warning signs, infection symptoms, calling MD promptly, preventive modes, and value of vaccinations. Review: effective airway clearance, coughing and/or vibration techniques. Create an Sports administrator.   Oxygen: - Individual and group verbal and written instruction on oxygen therapy. Includes supplement oxygen, available portable oxygen systems,  continuous and intermittent flow rates, oxygen safety, concentrators, and Medicare reimbursement for oxygen.   Respiratory Medications: - Group verbal and written instruction to review medications for lung disease. Drug class, frequency, complications, importance of spacers, rinsing mouth after steroid MDI's, and proper cleaning methods for nebulizers.      Pulmonary Rehab from 01/06/2015 in [place]   Date  12/06/14   Educator  LB   Instruction Review Code  2- meets goals/outcomes      AED/CPR: - Group verbal and written instruction with the use of models to demonstrate the basic use of the AED with  the basic ABC's of resuscitation.   Breathing Retraining: - Provides individuals verbal and written instruction on purpose, frequency, and proper technique of diaphragmatic breathing and pursed-lipped breathing. Applies individual practice skills.      Pulmonary Rehab from 01/06/2015 in [place]   Date  12/06/14   Educator  LB   Instruction Review Code  2- meets goals/outcomes      Anatomy and Physiology of the Lungs: - Group verbal and written instruction with the use of models to provide basic lung anatomy and physiology related to function, structure and complications of lung disease.      Pulmonary Rehab from 01/06/2015 in [place]   Date  12/16/14   Educator  [place]   Instruction Review Code  2- meets goals/outcomes      Heart Failure: - Group verbal and written instruction on the basics of heart failure: signs/symptoms, treatments, explanation of ejection fraction, enlarged heart and cardiomyopathy.   Sleep Apnea: - Individual verbal and written instruction to review Obstructive Sleep Apnea. Review of risk factors, methods for diagnosing and types of masks and machines for OSA.   Anxiety: - Provides group, verbal and written instruction on the correlation  between heart/lung disease and anxiety, treatment options, and management of anxiety.   Relaxation: - Provides group, verbal and written instruction about the benefits of relaxation for patients with heart/lung disease. Also provides patients with examples of relaxation techniques.   Knowledge Questionnaire Score:     Knowledge Questionnaire Score - 12/06/14 1130    Knowledge Questionnaire Score   Pre Score -2      Personal Goals and Risk Factors at Admission:     Personal Goals and Risk Factors at Admission - 12/07/14 0835    Personal Goals and Risk Factors on Admission    Weight Management Yes   Intervention Learn and follow the exercise and diet guidelines while in the program. Utilize the nutrition and education classes to help gain knowledge of the diet and exercise expectations in the program   Increase Aerobic Exercise and Physical Activity Yes   Intervention While in program, learn and follow the exercise prescription taught. Start at a low level workload and increase workload after able to maintain previous level for 30 minutes. Increase time before increasing intensity.   Understand more about Heart/Pulmonary Disease. Yes   Intervention While in program utilize professionals for any questions, and attend the education sessions. Great websites to use are www.americanheart.org or www.lung.org for reliable information.   Improve shortness of breath with ADL's Yes   Intervention While in program, learn and follow the exercise prescription taught. Start at a low level workload and increase workload ad advised by the exercise physiologist. Increase time before increasing intensity.   Develop more efficient breathing techniques such as purse lipped breathing and diaphragmatic breathing; and practicing self-pacing with activity Yes   Intervention While in program, learn and utilize the specific breathing techniques taught to you. Continue to practice and use the techniques as needed.    Increase knowledge of respiratory medications and ability to use respiratory devices properly.  Yes   Intervention While in program, learn to administer MDI, nebulizer, and spacer properly.;Learn to take respiratory medicine as ordered.;While in program, learn to Clean MDI, nebulizers, and spacers properly.   Diabetes Yes   Goal Blood glucose control identified by blood glucose values, HgbA1C. Participant verbalizes understanding of the signs/symptoms of hyper/hypo glycemia, proper foot care and importance of  medication and nutrition plan for blood glucose control.   Intervention Provide nutrition & aerobic exercise along with prescribed medications to achieve blood glucose in normal ranges: Fasting 65-99 mg/dL   Hypertension Yes   Goal Participant will see blood pressure controlled within the values of 140/86m/Hg or within value directed by their physician.   Intervention Provide nutrition & aerobic exercise along with prescribed medications to achieve BP 140/90 or less.      Personal Goals and Risk Factors Review:      Goals and Risk Factor Review      12/19/14 1130 01/02/15 1130 01/02/15 1233 01/04/15 1000     Increase Aerobic Exercise and Physical Activity   Goals Progress/Improvement seen    Yes     Comments   wants to purchase a treadmill     Understand more about Heart/Pulmonary Disease   Goals Progress/Improvement seen   Yes      Comments  Ms MCocuzzais attending our educational sessions and has contributed very helpful information for  the group with some of our educational sessions.      Improve shortness of breath with ADL's   Goals Progress/Improvement seen    Yes     Comments   gardening is easier     Breathing Techniques   Goals Progress/Improvement seen  Yes  Yes     Comments Observed Ms MReigerperform PLB during exercise on the equipment. She states she uses the technique at home with activities.       Increase knowledge of respiratory medications   Goals  Progress/Improvement seen     Yes    Comments    Discussed MDI use of her Symbicort, Albuterol, and Spacer - properly using       Personal Goals Discharge:    Comments: 30 day review. Continue with ITP

## 2015-01-09 NOTE — Progress Notes (Signed)
Daily Session Note  Patient Details  Name: Mary Cantrell MRN: [id number] Date of Birth: [birth year] Referring Provider:  Steele Sizer, MD  Encounter Date: 01/09/2015  Check In:     Session Check In - 01/09/15 1511    Check-In   Staff Present Carson Myrtle BS, RRT, Respiratory Therapist;Steven Way BS, ACSM EP-C, Exercise Physiologist   ER physicians immediately available to respond to emergencies [facility] immediately available ER MD   Physician(s) Dr. Jacqualine Code and Jimmye Norman   Medication changes reported     No   Fall or balance concerns reported    No   Warm-up and Cool-down Performed on first and last piece of equipment   VAD Patient? No   Pain Assessment   Currently in Pain? No/denies           Exercise Prescription Changes - 01/09/15 1500    Exercise Review   Progression Yes   Response to Exercise   Blood Pressure (Admit) 120/64 mmHg   Blood Pressure (Exercise) 140/70 mmHg   Blood Pressure (Exit) 124/80 mmHg   Heart Rate (Admit) 64 bpm   Heart Rate (Exercise) 99 bpm   Heart Rate (Exit) 85 bpm   Oxygen Saturation (Admit) 92 %   Oxygen Saturation (Exercise) 94 %   Oxygen Saturation (Exit) 95 %   Rating of Perceived Exertion (Exercise) 12   Perceived Dyspnea (Exercise) 4   Resistance Training   Training Prescription Yes   Weight 1   Reps 10-12   Treadmill   MPH 2.2   Grade 0   Minutes 15   NuStep   Level 3   Watts 50   Minutes 15   REL-XR   Level 3   Watts 60   Minutes 15      Goals Met:  Exercise tolerated well Personal goals reviewed Strength training completed today  Goals Unmet:  Not Applicable  Goals Comments: Progressing well with her exercise prescription.    Dr. Emily Filbert is Medical Director for [place] and [facility].

## 2015-01-11 ENCOUNTER — Encounter: Payer: Medicare Other | Admitting: *Deleted

## 2015-01-11 DIAGNOSIS — J4520 Mild intermittent asthma, uncomplicated: Secondary | ICD-10-CM | POA: Diagnosis not present

## 2015-01-11 DIAGNOSIS — G4730 Sleep apnea, unspecified: Secondary | ICD-10-CM

## 2015-01-11 NOTE — Progress Notes (Signed)
Daily Session Note  Patient Details  Name: Mary Cantrell MRN: [id number] Date of Birth: October 16, 1945 Referring Provider:  Steele Sizer, MD  Encounter Date: 01/11/2015  Check In:     Session Check In - 01/11/15 1158    Check-In   Staff Present Laureen Owens Shark BS, RRT, Respiratory Therapist;Kinda Pottle Dillard Essex MS, ACSM CEP Exercise Physiologist;Steven Way BS, ACSM EP-C, Exercise Physiologist   ER physicians immediately available to respond to emergencies [facility] immediately available ER MD   Physician(s) Joni Fears and Edd Fabian   Medication changes reported     No   Fall or balance concerns reported    No   Warm-up and Cool-down Performed on first and last piece of equipment   VAD Patient? No   Pain Assessment   Currently in Pain? No/denies   Multiple Pain Sites No         Goals Met:  Proper associated with RPD/PD & O2 Sat Independence with exercise equipment Using PLB without cueing & demonstrates good technique Exercise tolerated well Strength training completed today  Goals Unmet:  Not Applicable  Goals Comments: Discussed with [name] about using the NuStep, although it had bothered her heel. She would rather stay with the BioStep.   Dr. Emily Filbert is Medical Director for [place] and [facility].

## 2015-01-20 DIAGNOSIS — J4520 Mild intermittent asthma, uncomplicated: Secondary | ICD-10-CM | POA: Diagnosis not present

## 2015-01-20 DIAGNOSIS — G4730 Sleep apnea, unspecified: Secondary | ICD-10-CM

## 2015-01-20 NOTE — Progress Notes (Signed)
Daily Session Note  Patient Details  Name: Mary Cantrell MRN: [id number] Date of Birth: 06-23-1946 Referring Provider:  Erby Pian, MD  Encounter Date: 01/20/2015  Check In:     Session Check In - 01/20/15 1212    Check-In   Staff Present Carson Myrtle BS, RRT, Respiratory Therapist;Carroll Enterkin RN, BSN;Other   ER physicians immediately available to respond to emergencies [facility] immediately available ER MD   Physician(s) Thomasene Lot and Jimmye Norman   Medication changes reported     No   Fall or balance concerns reported    No   Warm-up and Cool-down Performed on first and last piece of equipment   VAD Patient? No   Pain Assessment   Currently in Pain? No/denies         Goals Met:  Proper associated with RPD/PD & O2 Sat Independence with exercise equipment Using PLB without cueing & demonstrates good technique Exercise tolerated well Strength training completed today  Goals Unmet:  Not Applicable  Goals Comments: Discussed with [name] about adding 1% grade to the treadmill on Monday. She is progressing well with her exercise goals.   Dr. Emily Filbert is Medical Director for [place] and [facility].

## 2015-01-23 ENCOUNTER — Encounter: Payer: Medicare Other | Attending: Specialist | Admitting: *Deleted

## 2015-01-23 DIAGNOSIS — J4520 Mild intermittent asthma, uncomplicated: Secondary | ICD-10-CM | POA: Insufficient documentation

## 2015-01-23 DIAGNOSIS — G4730 Sleep apnea, unspecified: Secondary | ICD-10-CM | POA: Diagnosis present

## 2015-01-23 DIAGNOSIS — G4733 Obstructive sleep apnea (adult) (pediatric): Secondary | ICD-10-CM

## 2015-01-23 NOTE — Progress Notes (Signed)
Daily Session Note  Patient Details  Name: Mary Cantrell MRN: [id number] Date of Birth: 1945/12/14 Referring Provider:  Erby Pian, MD  Encounter Date: 01/23/2015  Check In:     Session Check In - 01/23/15 1312    Check-In   Staff Present Laureen Owens Shark BS, RRT, Respiratory Therapist;Susanne Bice RN, BSN, CCRP;Carroll Enterkin RN, BSN   ER physicians immediately available to respond to emergencies [facility] immediately available ER MD   Physician(s) Drs: Edd Fabian and Paduchowski   Medication changes reported     No   Fall or balance concerns reported    No   Warm-up and Cool-down Performed on first and last piece of equipment   VAD Patient? No   Pain Assessment   Currently in Pain? No/denies           Exercise Prescription Changes - 01/23/15 1300    Exercise Review   Progression Yes   Response to Exercise   Blood Pressure (Admit) 120/64 mmHg   Blood Pressure (Exercise) 140/70 mmHg   Blood Pressure (Exit) 124/80 mmHg   Heart Rate (Admit) 64 bpm   Heart Rate (Exercise) 99 bpm   Heart Rate (Exit) 85 bpm   Oxygen Saturation (Admit) 92 %   Oxygen Saturation (Exercise) 94 %   Oxygen Saturation (Exit) 95 %   Rating of Perceived Exertion (Exercise) 12   Perceived Dyspnea (Exercise) 4   Resistance Training   Training Prescription Yes   Weight 1   Reps 10-12   Treadmill   MPH 2.2   Grade 1   Minutes 15   NuStep   Level 3   Watts 50   Minutes 15   REL-XR   Level 3   Watts 60   Minutes 15      Goals Met:  Independence with exercise equipment Exercise tolerated well Personal goals reviewed Strength training completed today  Goals Unmet:  Not Applicable  Goals Comments: Doing well with exercise prescription progression.                                 Arthritis pain today, so exercise goals modified.   Dr. Emily Filbert is Medical Director for [place] and [facility].

## 2015-01-25 ENCOUNTER — Encounter: Payer: Medicare Other | Admitting: *Deleted

## 2015-01-25 DIAGNOSIS — G4730 Sleep apnea, unspecified: Secondary | ICD-10-CM

## 2015-01-25 DIAGNOSIS — J4520 Mild intermittent asthma, uncomplicated: Secondary | ICD-10-CM | POA: Diagnosis not present

## 2015-01-25 NOTE — Progress Notes (Signed)
Daily Session Note  Patient Details  Name: Mary Cantrell MRN: [id number] Date of Birth: 21-May-1946 Referring Provider:  Erby Pian, MD  Encounter Date: 01/25/2015  Check In:     Session Check In - 01/25/15 1159    Check-In   Staff Present Candiss Norse MS, ACSM CEP Exercise Physiologist;Laureen Janell Quiet, RRT, Respiratory Therapist;Steven Way BS, ACSM EP-C, Exercise Physiologist   ER physicians immediately available to respond to emergencies [facility] immediately available ER MD   Physician(s) Cinda Quest and Joni Fears   Medication changes reported     No   Fall or balance concerns reported    No   Warm-up and Cool-down Performed on first and last piece of equipment   VAD Patient? No   Pain Assessment   Currently in Pain? No/denies   Multiple Pain Sites No         Goals Met:  Proper associated with RPD/PD & O2 Sat Independence with exercise equipment Using PLB without cueing & demonstrates good technique Exercise tolerated well Strength training completed today  Goals Unmet:  Not Applicable  Goals Comments: [name] had left shoulder pain today. She has rotator cuff injury, but she does not want to have surgery.   Dr. Emily Filbert is Medical Director for [place] and [facility].

## 2015-01-27 ENCOUNTER — Encounter: Payer: Medicare Other | Admitting: Respiratory Therapy

## 2015-01-27 DIAGNOSIS — J4520 Mild intermittent asthma, uncomplicated: Secondary | ICD-10-CM

## 2015-01-27 DIAGNOSIS — G4730 Sleep apnea, unspecified: Secondary | ICD-10-CM

## 2015-01-27 NOTE — Progress Notes (Signed)
Daily Session Note  Patient Details  Name: Mary Cantrell MRN: [id number] Date of Birth: 09-04-1945 Referring Provider:  Erby Pian, MD  Encounter Date: 01/27/2015  Check In:     Session Check In - 01/27/15 1659    Check-In   Staff Present Frederich Cha RRT, RCP Respiratory Therapist;Renee Dillard Essex MS, ACSM CEP Exercise Physiologist;Carroll Enterkin RN, BSN   ER physicians immediately available to respond to emergencies [facility] immediately available ER MD   Physician(s) Dr. Jimmye Norman and Marcelene Butte   Medication changes reported     No   Fall or balance concerns reported    No   Warm-up and Cool-down Performed on first and last piece of equipment   VAD Patient? No   Pain Assessment   Currently in Pain? No/denies         Goals Met:  Proper associated with RPD/PD & O2 Sat Independence with exercise equipment Exercise tolerated well Strength training completed today  Goals Unmet:  Not Applicable  Goals Comments:    Dr. Emily Filbert is Medical Director for [place] and [facility].

## 2015-01-30 ENCOUNTER — Encounter: Payer: Medicare Other | Admitting: *Deleted

## 2015-01-30 DIAGNOSIS — J4520 Mild intermittent asthma, uncomplicated: Secondary | ICD-10-CM | POA: Diagnosis not present

## 2015-01-30 DIAGNOSIS — G4730 Sleep apnea, unspecified: Secondary | ICD-10-CM

## 2015-01-30 NOTE — Progress Notes (Signed)
Daily Session Note  Patient Details  Name: Syndi Pua MRN: [id number] Date of Birth: 1946/03/16 Referring Provider:  Erby Pian, MD  Encounter Date: 01/30/2015  Check In:     Session Check In - 01/30/15 1208    Check-In   Staff Present Carson Myrtle BS, RRT, Respiratory Therapist;Crimson Beer RN, BSN;Kelly Hayes BS, ACSM CEP Exercise Physiologist   ER physicians immediately available to respond to emergencies [facility] immediately available ER MD   Physician(s) Dr. Jacqualine Code and Dr. Kerman Passey    Medication changes reported     No   Fall or balance concerns reported    No   Warm-up and Cool-down Performed on first and last piece of equipment   VAD Patient? No   Pain Assessment   Currently in Pain? No/denies         Goals Met:  Proper associated with RPD/PD & O2 Sat Exercise tolerated well  Goals Unmet:  Not Applicable  Goals Comments: Planning Mid 46md on Wednesday.   Dr. MEmily Filbertis Medical Director for [facility].

## 2015-01-31 ENCOUNTER — Telehealth: Payer: Self-pay | Admitting: Family Medicine

## 2015-01-31 NOTE — Telephone Encounter (Signed)
Pt called stating that the test strips that were called into [facility] on 01/05/15 were ordered under the wrong number and the pharmacy cannot order them.

## 2015-02-01 ENCOUNTER — Other Ambulatory Visit: Payer: Self-pay

## 2015-02-01 MED ORDER — GLUCOSE BLOOD VI STRP: 1.0000 | ORAL_STRIP | Freq: Two times a day (BID) | Status: DC

## 2015-02-01 NOTE — Telephone Encounter (Signed)
Pharmacy called states u need to send new RX with new ICD10 code for diabtetes

## 2015-02-03 ENCOUNTER — Encounter: Payer: Medicare Other | Admitting: *Deleted

## 2015-02-03 DIAGNOSIS — J4520 Mild intermittent asthma, uncomplicated: Secondary | ICD-10-CM | POA: Diagnosis not present

## 2015-02-03 DIAGNOSIS — G4730 Sleep apnea, unspecified: Secondary | ICD-10-CM

## 2015-02-03 NOTE — Progress Notes (Signed)
Daily Session Note  Patient Details  Name: Mary Cantrell MRN: [id number] Date of Birth: 1945-10-21 Referring Provider:  Steele Sizer, MD  Encounter Date: 02/03/2015  Check In:     Session Check In - 02/03/15 1145    Check-In   Staff Present Frederich Cha RRT, RCP Respiratory Therapist;Elainna Eshleman Dillard Essex MS, ACSM CEP Exercise Physiologist;Carroll Enterkin RN, BSN   ER physicians immediately available to respond to emergencies [facility] immediately available ER MD   Physician(s) Jacqualine Code and K[place]   Medication changes reported     No   Fall or balance concerns reported    No   Warm-up and Cool-down Performed on first and last piece of equipment   VAD Patient? No   Pain Assessment   Currently in Pain? No/denies   Multiple Pain Sites No           Exercise Prescription Changes - 02/03/15 1100    Exercise Review   Progression Yes   Response to Exercise   Blood Pressure (Admit) 120/64 mmHg   Blood Pressure (Exercise) 140/70 mmHg   Blood Pressure (Exit) 124/80 mmHg   Heart Rate (Admit) 64 bpm   Heart Rate (Exercise) 99 bpm   Heart Rate (Exit) 85 bpm   Oxygen Saturation (Admit) 92 %   Oxygen Saturation (Exercise) 94 %   Oxygen Saturation (Exit) 95 %   Rating of Perceived Exertion (Exercise) 12   Perceived Dyspnea (Exercise) 4   Resistance Training   Training Prescription Yes   Weight 1   Reps 10-12   Treadmill   MPH 2.2   Grade 1   Minutes 15   NuStep   Level 5   Watts 60   Minutes 15   REL-XR   Level 3   Watts 60   Minutes 15      Goals Met:  Proper associated with RPD/PD & O2 Sat Independence with exercise equipment Using PLB without cueing & demonstrates good technique Exercise tolerated well Personal goals reviewed Strength training completed today  Goals Unmet:  Not Applicable  Goals Comments: [name] is having a lot of joint pain from rheumatoid arthritis.  She has decreased all of her work loads in hopes that this will decrease her pain and that  she can decrease the amount of pain medication she is having to take.    Dr. Emily Filbert is Medical Director for [place] and [facility].

## 2015-02-06 ENCOUNTER — Encounter: Payer: Medicare Other | Admitting: *Deleted

## 2015-02-06 DIAGNOSIS — G4730 Sleep apnea, unspecified: Secondary | ICD-10-CM

## 2015-02-06 DIAGNOSIS — J4520 Mild intermittent asthma, uncomplicated: Secondary | ICD-10-CM | POA: Diagnosis not present

## 2015-02-06 NOTE — Progress Notes (Signed)
Daily Session Note  Patient Details  Name: Mary Cantrell MRN: [id number] Date of Birth: 05-26-1946 Referring Provider:  Erby Pian, MD  Encounter Date: 02/06/2015  Check In:     Session Check In - 02/06/15 1159    Check-In   Staff Present Laureen Owens Shark BS, RRT, Respiratory Therapist;Kelly Alfonso Patten, ACSM CEP Exercise Physiologist;Ellar Hakala RN, BSN   Physician(s) Dr. Karma Greaser and Dr. Jimmye Norman   Medication changes reported     No   Fall or balance concerns reported    No   Warm-up and Cool-down Performed on first and last piece of equipment   VAD Patient? No   Pain Assessment   Pain Score 1    Pain Location Knee   Pain Orientation Right   Pain Descriptors / Indicators Constant   Pain Type Chronic pain   Pain Onset More than a month ago   Pain Frequency Constant   Pain Relieving Factors Knee brace         Goals Met:  Proper associated with RPD/PD & O2 Sat  Goals Unmet:  Not Applicable  Goals Comments: [name] is still having an arthritis flare and has reduced her exercise goals. She is unable to perform the mid 79md at this time.   Dr. MEmily Filbertis Medical Director for [facility].

## 2015-02-06 NOTE — Progress Notes (Signed)
Pulmonary Individual Treatment Plan  Patient Details  Name: Mary Cantrell MRN: [id number] Date of Birth: June 02, 1946 Referring Provider:  Erby Pian, MD  Initial Encounter Date: 12/06/2014  Visit Diagnosis: Sleep apnea  Patient's Home Medications on Admission:  Current outpatient prescriptions:    albuterol (VENTOLIN HFA) 108 (90 BASE) MCG/ACT inhaler, Inhale into the lungs., Disp: , Rfl:    aspirin 81 MG tablet, Take 81 mg by mouth daily., Disp: , Rfl:    budesonide-formoterol (SYMBICORT) 160-4.5 MCG/ACT inhaler, Inhale into the lungs., Disp: , Rfl:    Cinnamon 500 MG capsule, Take by mouth., Disp: , Rfl:    diphenhydrAMINE (BENADRYL) 25 MG tablet, Take 1 tablet by mouth daily., Disp: , Rfl:    fluticasone (FLONASE) 50 MCG/ACT nasal spray, Place into the nose., Disp: , Rfl:    gabapentin (NEURONTIN) 100 MG capsule, Take 1 capsule (100 mg total) by mouth 3 (three) times daily., Disp: 180 capsule, Rfl: 4   glipiZIDE (GLIPIZIDE XL) 5 MG 24 hr tablet, Take 1 tablet (5 mg total) by mouth daily with breakfast., Disp: 90 tablet, Rfl: 0   glucose blood (ONE TOUCH ULTRA TEST) test strip, 1 each by Other route 2 (two) times daily. Use as instructed, Disp: 100 each, Rfl: 2   hydrOXYzine (ATARAX/VISTARIL) 25 MG tablet, Take 1 tablet (25 mg total) by mouth 3 (three) times daily as needed., Disp: 30 tablet, Rfl: 0   loratadine (CLARITIN) 10 MG tablet, Take 1 tablet (10 mg total) by mouth daily., Disp: 60 tablet, Rfl: 0   losartan (COZAAR) 50 MG tablet, TAKE 1 TABLET DAILY FOR BLOOD PRESSURE, Disp: 90 tablet, Rfl: 0   meloxicam (MOBIC) 7.5 MG tablet, Take by mouth., Disp: , Rfl:    metFORMIN (GLUCOPHAGE-XR) 500 MG 24 hr tablet, One in am and 2 in pm, Disp: 90 tablet, Rfl: 2   pravastatin (PRAVACHOL) 40 MG tablet, Take by mouth., Disp: , Rfl:    ranitidine (ZANTAC) 150 MG tablet, Take 1 tablet (150 mg total) by mouth 2 (two) times daily., Disp: 60 tablet, Rfl: 0    spironolactone-hydrochlorothiazide (ALDACTAZIDE) 25-25 MG per tablet, TAKE ONE-HALF (1/2) TO ONE TABLET DAILY FOR BLOOD PRESSURE, Disp: 90 tablet, Rfl: 1   vitamin C (ASCORBIC ACID) 500 MG tablet, Take 1 tablet by mouth daily., Disp: , Rfl:   Past Medical History: No past medical history on file.  Tobacco Use: History  Smoking status   Never Smoker   Smokeless tobacco   Never Used    Labs: Recent Review Flowsheet Data    There is no flowsheet data to display.         POCT Glucose      12/12/14 1130 12/14/14 1130 12/16/14 1442       POCT Blood Glucose   Pre-Exercise 193 mg/dL       Post-Exercise 117 mg/dL       Pre-Exercise #2  218 mg/dL      Post-Exercise #2  159 mg/dL      Pre-Exercise #3   203 mg/dL     Post-Exercise #3   113 mg/dL        ADL UCSD:     ADL UCSD      12/06/14 1130       ADL UCSD   ADL Phase Entry     SOB Score total 73     Rest 2     Walk 2     Stairs 4     Bath 3  Dress 2     Shop 4         Pulmonary Function Assessment:     Pulmonary Function Assessment - 12/06/14 1130    Initial Spirometry Results   FVC% 84 %   FEV1% 88 %   FEV1/FVC Ratio 83.25      Exercise Target Goals:    Exercise Program Goal: Individual exercise prescription set with THRR, safety & activity barriers. Participant demonstrates ability to understand and report RPE using BORG scale, to self-measure pulse accurately, and to acknowledge the importance of the exercise prescription.  Exercise Prescription Goal: Starting with aerobic activity 30 plus minutes a day, 3 days per week for initial exercise prescription. Provide home exercise prescription and guidelines that participant acknowledges understanding prior to discharge.  Activity Barriers & Risk Stratification:     Activity Barriers & Risk Stratification - 12/06/14 1130    Activity Barriers & Risk Stratification   Activity Barriers Shortness of Breath;Deconditioning;Balance Concerns   Risk  Stratification Moderate      6 Minute Walk:     6 Minute Walk      12/06/14 1240       6 Minute Walk   Phase Initial     Distance 1320 feet     Walk Time 6 minutes     Resting HR 78 bpm     Resting BP 118/74 mmHg     Max Ex. HR 84 bpm     Max Ex. BP 132/84 mmHg     RPE 13     Perceived Dyspnea  3     Symptoms No        Initial Exercise Prescription:     Initial Exercise Prescription - 12/06/14 1700    Date of Initial Exercise Prescription   Date 12/06/14   Treadmill   MPH 1.5   Grade 0   Minutes 10   Recumbant Bike   Level 1   RPM 40   Watts 20   Minutes 10   NuStep   Level 4   Watts 40   Minutes 10   Arm Ergometer   Level 1   Watts 10   Minutes 10   Arm/Foot Ergometer   Level 1   Watts 12   Minutes 10   Recumbant Elliptical   Level 1   RPM 40   Watts 40   Minutes 10   Elliptical   Level 1   Speed 3   Minutes 1   REL-XR   Level 1   Watts 40   Minutes 10   Prescription Details   Frequency (times per week) 3   Duration Progress to 30 minutes of continuous aerobic without signs/symptoms of physical distress   Intensity   THRR REST +  30   Ratings of Perceived Exertion 11-15   Perceived Dyspnea 2-4   Progression Continue progressive overload as per policy without signs/symptoms or physical distress.   Resistance Training   Training Prescription Yes   Weight 1   Reps 10-12      Exercise Prescription Changes:     Exercise Prescription Changes      12/12/14 1400 12/14/14 1400 12/19/14 1200 01/03/15 0800 01/04/15 1400   Exercise Review   Progression   Yes Yes Yes   Response to Exercise   Blood Pressure (Admit) 140/84 mmHg 140/84 mmHg 140/84 mmHg 130/82 mmHg  Entry for 01/02/2015 120/64 mmHg   Blood Pressure (Exercise) 148/80 mmHg 148/80 mmHg 148/80 mmHg 140/80 mmHg 140/70 mmHg  Blood Pressure (Exit) 130/76 mmHg 130/76 mmHg 130/76 mmHg 122/80 mmHg 124/80 mmHg   Heart Rate (Admit) 87 bpm 87 bpm 87 bpm 82 bpm 64 bpm   Heart Rate  (Exercise) 92 bpm 92 bpm 92 bpm 96 bpm 99 bpm   Heart Rate (Exit) 76 bpm 76 bpm 76 bpm 80 bpm 85 bpm   Oxygen Saturation (Admit) 92 % 92 % 92 % 95 % 92 %   Oxygen Saturation (Exercise) 95 % 95 % 95 % 95 % 94 %   Oxygen Saturation (Exit) 92 % 92 % 92 % 95 % 95 %   Rating of Perceived Exertion (Exercise) 12 12 12 12 12    Perceived Dyspnea (Exercise) 3 3 3 2 4    Resistance Training   Training Prescription Yes Yes Yes Yes Yes   Weight 1 1 1 1 1    Reps 10-12 10-12 10-12 10-12 10-12   Treadmill   MPH 1.5 1.5 1.5 1.7 1.9   Grade 0 0 0 0 0   Minutes 10 10 15 15 15    Recumbant Bike   Level  1 1 1 3    RPM  40 40 40 50   Watts     15   Minutes  15  NuStep bothered Ms Livingston's right heal, new goal RB 15  NuStep bothered Ms Luhman's right heal, new goal RB 15  NuStep bothered Ms Depaulo's right heal, new goal RB    NuStep   Level 2 2 2 2     Watts 40 40 40 40    Minutes 15 15 15 15     REL-XR   Level 2 2 2 3 3    Watts 40 40 40 50 50   Minutes 10 10 10 15 10      01/06/15 1200 01/09/15 1500 01/23/15 1300 02/03/15 1100 02/06/15 1500   Exercise Review   Progression Yes Yes Yes Yes Yes   Response to Exercise   Blood Pressure (Admit) 120/64 mmHg 120/64 mmHg 120/64 mmHg 120/64 mmHg 128/70 mmHg   Blood Pressure (Exercise) 140/70 mmHg 140/70 mmHg 140/70 mmHg 140/70 mmHg 150/90 mmHg   Blood Pressure (Exit) 124/80 mmHg 124/80 mmHg 124/80 mmHg 124/80 mmHg 122/62 mmHg   Heart Rate (Admit) 64 bpm 64 bpm 64 bpm 64 bpm 89 bpm   Heart Rate (Exercise) 99 bpm 99 bpm 99 bpm 99 bpm 84 bpm   Heart Rate (Exit) 85 bpm 85 bpm 85 bpm 85 bpm 77 bpm   Oxygen Saturation (Admit) 92 % 92 % 92 % 92 % 94 %   Oxygen Saturation (Exercise) 94 % 94 % 94 % 94 % 97 %   Oxygen Saturation (Exit) 95 % 95 % 95 % 95 % 96 %   Rating of Perceived Exertion (Exercise) 12 12 12 12 15    Perceived Dyspnea (Exercise) 4 4 4 4 3    Resistance Training   Training Prescription Yes Yes Yes Yes Yes   Weight 1 1 1 1 1    Reps 10-12 10-12 10-12  10-12 10-12   Treadmill   MPH 1.9 2.2 2.2 2.2 2.2   Grade 0 0 1 1 0   Minutes 15 15 15 15 15    Recumbant Bike   Level 3    3   RPM 50    50   Watts 15       Minutes     15   NuStep   Level  3 3 5 5    Watts  50 50  60 60   Minutes  15 15 15 15    REL-XR   Level 3 3 3 3 3    Watts 60 60 60 60 60   Minutes 15 15 15 15 15       Discharge Exercise Prescription (Final Exercise Prescription Changes):     Exercise Prescription Changes - 02/06/15 1500    Exercise Review   Progression Yes   Response to Exercise   Blood Pressure (Admit) 128/70 mmHg   Blood Pressure (Exercise) 150/90 mmHg   Blood Pressure (Exit) 122/62 mmHg   Heart Rate (Admit) 89 bpm   Heart Rate (Exercise) 84 bpm   Heart Rate (Exit) 77 bpm   Oxygen Saturation (Admit) 94 %   Oxygen Saturation (Exercise) 97 %   Oxygen Saturation (Exit) 96 %   Rating of Perceived Exertion (Exercise) 15   Perceived Dyspnea (Exercise) 3   Resistance Training   Training Prescription Yes   Weight 1   Reps 10-12   Treadmill   MPH 2.2   Grade 0   Minutes 15   Recumbant Bike   Level 3   RPM 50   Minutes 15   NuStep   Level 5   Watts 60   Minutes 15   REL-XR   Level 3   Watts 60   Minutes 15       Nutrition:  Target Goals: Understanding of nutrition guidelines, daily intake of sodium <1557m, cholesterol <2035m calories 30% from fat and 7% or less from saturated fats, daily to have 5 or more servings of fruits and vegetables.  Biometrics:    Nutrition Therapy Plan and Nutrition Goals:     Nutrition Therapy & Goals - 12/06/14 1130    Nutrition Therapy   Diet Ms MuHeilerrefers not to meet with the dietitian; she states she known what to do; she eats out mostly, but does eat only half and takes the rest to go home; she would like to loss 20lbs      Nutrition Discharge: Rate Your Plate Scores:   Psychosocial: Target Goals: Acknowledge presence or absence of depression, maximize coping skills, provide positive  support system. Participant is able to verbalize types and ability to use techniques and skills needed for reducing stress and depression.  Initial Review & Psychosocial Screening:     Initial Psych Review & Screening - 12/06/14 1130    Family Dynamics   Good Support System? Yes   Comments Her husband and 2 children are very supportive; her sister lives close and is supportive.   Barriers   Psychosocial barriers to participate in program There are no identifiable barriers or psychosocial needs.;The patient should benefit from training in stress management and relaxation.   Screening Interventions   Interventions Encouraged to exercise;Program counselor consult      Quality of Life Scores:     Quality of Life - 12/06/14 1130    Quality of Life Scores   Health/Function Pre 16.59 %   Socioeconomic Pre 23.93 %   Psych/Spiritual Pre 24 %   Family Pre 26.1 %   GLOBAL Pre 20.81 %      PHQ-9:     Recent Review Flowsheet Data    [facility] 12/21/2014 12/06/2014   Decreased Interest 0 0   Down, Depressed, Hopeless 0 0   PHQ - 2 Score 0 0      Psychosocial Evaluation and Intervention:     Psychosocial Evaluation - 12/12/14 1254    Psychosocial Evaluation &  Interventions   Comments Counselor met with Ms. Mcneely for initial psychosocial evaluation.   She is a 69 year old female who has asthma, sleep apnea and type 2 diabetes.  She has a strong support system with a spouse of 59 years.  Ms. Pepitone states she has been sleeping better since she began using a CPAP machine.  Her appetite is "okay" and she reports no current symptoms of depression or anxiety, although there was a history of depression "many years ago."  Ms. Trostel states that her health issues and subsequent limitations are stressful for her making traveling and gardening difficult.  She presents in a positive mood and has goals to increase her stamina, strength, and be able to breathe better.  Counselor  encouraged her to consistently exercise to accomplish these goals.          Psychosocial Re-Evaluation:     Psychosocial Re-Evaluation      01/04/15 1037           Psychosocial Re-Evaluation   Comments Counselor met with Ms. Schult today for a follow up.  She reports being stronger and having more energy to garden and do those things that she enjoys since beginning this program.  She is working hard on her goals and it appears to be paying off for Ms. Hendley.           Education: Education Goals: Education classes will be provided on a weekly basis, covering required topics. Participant will state understanding/return demonstration of topics presented.  Learning Barriers/Preferences:     Learning Barriers/Preferences - 12/06/14 1130    Learning Barriers/Preferences   Learning Barriers None   Learning Preferences Group Instruction;Individual Instruction;Pictoral;Skilled Demonstration;Verbal Instruction;Video;Written Material      Education Topics: Initial Evaluation Education: - Verbal, written and demonstration of respiratory meds, RPE/PD scales, oximetry and breathing techniques. Instruction on use of nebulizers and MDIs: cleaning and proper use, rinsing mouth with steroid doses and importance of monitoring MDI activations.          Pulmonary Rehab from 02/06/2015 in [place]   Date  12/06/14   Educator  LB   Instruction Review Code  2- meets goals/outcomes      General Nutrition Guidelines/Fats and Fiber: -Group instruction provided by verbal, written material, models and posters to present the general guidelines for heart healthy nutrition. Gives an explanation and review of dietary fats and fiber.      Pulmonary Rehab from 02/06/2015 in [place]   Date  02/06/15   Educator  Karolee Stamps, RD   Instruction Review Code  2- meets goals/outcomes      Controlling Sodium/Reading Food  Labels: -Group verbal and written material supporting the discussion of sodium use in heart healthy nutrition. Review and explanation with models, verbal and written materials for utilization of the food label.   Exercise Physiology & Risk Factors: - Group verbal and written instruction with models to review the exercise physiology of the cardiovascular system and associated critical values. Details cardiovascular disease risk factors and the goals associated with each risk factor.   Aerobic Exercise & Resistance Training: - Gives group verbal and written discussion on the health impact of inactivity. On the components of aerobic and resistive training programs and the benefits of this training and how to safely progress through these programs.      Pulmonary Rehab from 02/06/2015 in [place]   Date  12/28/14  Educator  SW   Instruction Review Code  2- meets goals/outcomes      Flexibility, Balance, General Exercise Guidelines: - Provides group verbal and written instruction on the benefits of flexibility and balance training programs. Provides general exercise guidelines with specific guidelines to those with heart or lung disease. Demonstration and skill practice provided.      Pulmonary Rehab from 02/06/2015 in [place]   Date  01/25/15   Educator  SW   Instruction Review Code  2- meets goals/outcomes      Stress Management: - Provides group verbal and written instruction about the health risks of elevated stress, cause of high stress, and healthy ways to reduce stress.   Depression: - Provides group verbal and written instruction on the correlation between heart/lung disease and depressed mood, treatment options, and the stigmas associated with seeking treatment.   Exercise & Equipment Safety: - Individual verbal instruction and demonstration of equipment use and safety with use of the equipment.       Pulmonary Rehab from 02/06/2015 in [place]   Date  12/12/14   Educator  L. Owens Shark, RT   Instruction Review Code  2- meets goals/outcomes      Infection Prevention: - Provides verbal and written material to individual with discussion of infection control including proper hand washing and proper equipment cleaning during exercise session.      Pulmonary Rehab from 02/06/2015 in [place]   Date  12/12/14   Educator  LB   Instruction Review Code  2- meets goals/outcomes      Falls Prevention: - Provides verbal and written material to individual with discussion of falls prevention and safety.   Diabetes: - Individual verbal and written instruction to review signs/symptoms of diabetes, desired ranges of glucose level fasting, after meals and with exercise. Advice that pre and post exercise glucose checks will be done for 3 sessions at entry of program.   Chronic Lung Diseases: - Group verbal and written instruction to review new updates, new respiratory medications, new advancements in procedures and treatments. Provide informative websites and "800" numbers of self-education.      Pulmonary Rehab from 02/06/2015 in [place]   Date  01/30/15   Educator  Carson Myrtle, RRT   Instruction Review Code  2- meets goals/outcomes      Lung Procedures: - Group verbal and written instruction to describe testing methods done to diagnose lung disease. Review the outcome of test results. Describe the treatment choices: Pulmonary Function Tests, ABGs and oximetry.      Pulmonary Rehab from 02/06/2015 in [place]   Date  01/27/15   Educator  [place]   Instruction Review Code  2- meets goals/outcomes      Energy Conservation: - Provide group verbal and written instruction for methods to conserve energy, plan and organize activities. Instruct on  pacing techniques, use of adaptive equipment and posture/positioning to relieve shortness of breath.   Triggers: - Group verbal and written instruction to review types of environmental controls: home humidity, furnaces, filters, dust mite/pet prevention, HEPA vacuums. To discuss weather changes, air quality and the benefits of nasal washing.      Pulmonary Rehab from 02/06/2015 in [place]   Date  01/02/15   Educator  LB   Instruction Review Code  2- meets goals/outcomes      Exacerbations: -  Group verbal and written instruction to provide: warning signs, infection symptoms, calling MD promptly, preventive modes, and value of vaccinations. Review: effective airway clearance, coughing and/or vibration techniques. Create an Sports administrator.   Oxygen: - Individual and group verbal and written instruction on oxygen therapy. Includes supplement oxygen, available portable oxygen systems, continuous and intermittent flow rates, oxygen safety, concentrators, and Medicare reimbursement for oxygen.   Respiratory Medications: - Group verbal and written instruction to review medications for lung disease. Drug class, frequency, complications, importance of spacers, rinsing mouth after steroid MDI's, and proper cleaning methods for nebulizers.      Pulmonary Rehab from 02/06/2015 in [place]   Date  12/06/14   Educator  LB   Instruction Review Code  2- meets goals/outcomes      AED/CPR: - Group verbal and written instruction with the use of models to demonstrate the basic use of the AED with the basic ABC's of resuscitation.      Pulmonary Rehab from 02/06/2015 in [place]   Date  02/03/15   Educator  CE   Instruction Review Code  2- meets goals/outcomes      Breathing Retraining: - Provides individuals verbal and written instruction on purpose, frequency, and proper technique of  diaphragmatic breathing and pursed-lipped breathing. Applies individual practice skills.      Pulmonary Rehab from 02/06/2015 in [place]   Date  12/06/14   Educator  LB   Instruction Review Code  2- meets goals/outcomes      Anatomy and Physiology of the Lungs: - Group verbal and written instruction with the use of models to provide basic lung anatomy and physiology related to function, structure and complications of lung disease.      Pulmonary Rehab from 02/06/2015 in [place]   Date  12/16/14   Educator  [place]   Instruction Review Code  2- meets goals/outcomes      Heart Failure: - Group verbal and written instruction on the basics of heart failure: signs/symptoms, treatments, explanation of ejection fraction, enlarged heart and cardiomyopathy.   Sleep Apnea: - Individual verbal and written instruction to review Obstructive Sleep Apnea. Review of risk factors, methods for diagnosing and types of masks and machines for OSA.   Anxiety: - Provides group, verbal and written instruction on the correlation between heart/lung disease and anxiety, treatment options, and management of anxiety.   Relaxation: - Provides group, verbal and written instruction about the benefits of relaxation for patients with heart/lung disease. Also provides patients with examples of relaxation techniques.   Knowledge Questionnaire Score:     Knowledge Questionnaire Score - 12/06/14 1130    Knowledge Questionnaire Score   Pre Score -2      Personal Goals and Risk Factors at Admission:     Personal Goals and Risk Factors at Admission - 12/07/14 0835    Personal Goals and Risk Factors on Admission    Weight Management Yes   Intervention Learn and follow the exercise and diet guidelines while in the program. Utilize the nutrition and education classes to help gain knowledge of the diet and exercise expectations in the  program   Increase Aerobic Exercise and Physical Activity Yes   Intervention While in program, learn and follow the exercise prescription taught. Start at a low level workload and increase workload after able to maintain previous level for 30 minutes. Increase time before increasing intensity.  Understand more about Heart/Pulmonary Disease. Yes   Intervention While in program utilize professionals for any questions, and attend the education sessions. Great websites to use are www.americanheart.org or www.lung.org for reliable information.   Improve shortness of breath with ADL's Yes   Intervention While in program, learn and follow the exercise prescription taught. Start at a low level workload and increase workload ad advised by the exercise physiologist. Increase time before increasing intensity.   Develop more efficient breathing techniques such as purse lipped breathing and diaphragmatic breathing; and practicing self-pacing with activity Yes   Intervention While in program, learn and utilize the specific breathing techniques taught to you. Continue to practice and use the techniques as needed.   Increase knowledge of respiratory medications and ability to use respiratory devices properly.  Yes   Intervention While in program, learn to administer MDI, nebulizer, and spacer properly.;Learn to take respiratory medicine as ordered.;While in program, learn to Clean MDI, nebulizers, and spacers properly.   Diabetes Yes   Goal Blood glucose control identified by blood glucose values, HgbA1C. Participant verbalizes understanding of the signs/symptoms of hyper/hypo glycemia, proper foot care and importance of medication and nutrition plan for blood glucose control.   Intervention Provide nutrition & aerobic exercise along with prescribed medications to achieve blood glucose in normal ranges: Fasting 65-99 mg/dL   Hypertension Yes   Goal Participant will see blood pressure controlled within the values of  140/32m/Hg or within value directed by their physician.   Intervention Provide nutrition & aerobic exercise along with prescribed medications to achieve BP 140/90 or less.      Personal Goals and Risk Factors Review:      Goals and Risk Factor Review      12/19/14 1130 01/02/15 1130 01/02/15 1233 01/04/15 1000 01/20/15 1130   Increase Aerobic Exercise and Physical Activity   Goals Progress/Improvement seen    Yes  Yes   Comments   wants to purchase a treadmill  [place] and stairs from her exercise in [facility].   Understand more about Heart/Pulmonary Disease   Goals Progress/Improvement seen   Yes      Comments  Ms MBrougheris attending our educational sessions and has contributed very helpful information for  the group with some of our educational sessions.      Improve shortness of breath with ADL's   Goals Progress/Improvement seen    Yes     Comments   gardening is easier     Breathing Techniques   Goals Progress/Improvement seen  Yes  Yes     Comments Observed Ms MHartlperform PLB during exercise on the equipment. She states she uses the technique at home with activities.       Increase knowledge of respiratory medications   Goals Progress/Improvement seen     Yes    Comments    Discussed MDI use of her Symbicort, Albuterol, and Spacer - properly using      01/25/15 1256           Weight Management   Goals Progress/Improvement seen No       Comments Has not seen progress in this area since starting the program, however, her arthritis makes it difficult to do a lot of physical activity and some days she can do nothing because of the pain.        Increase Aerobic Exercise and Physical Activity   Goals  Progress/Improvement seen  Yes       Comments Is challenged by the exercise in class and has been able to make progressions, however, her arthritis causes her to have to step back sometimes. She added  elevation to the treadmill, but is unable to accomplish this goal on bad arthritis days. She has a hottub that she does water exercises in and this has helped the arthritis. She can do gardening, shopping and activities around the house easier now.        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 Also attends classes at [facility] on various topics and enjoys learning in general.        Improve shortness of breath with ADL's   Goals Progress/Improvement seen  Yes       Comments Can do activities around the house easier and is involved in several clubs. She can manage a more full schedule now that she has more stamina       Breathing Techniques   Goals Progress/Improvement seen  Yes       Comments Uses PLB and conserves energy when she feels short of breath, especially when she is outside          Personal Goals Discharge:    Comments: 30 Day Review; Arthritis flare - modifying her exercise goals.

## 2015-02-08 ENCOUNTER — Telehealth: Payer: Self-pay | Admitting: Family Medicine

## 2015-02-08 DIAGNOSIS — G4730 Sleep apnea, unspecified: Secondary | ICD-10-CM

## 2015-02-08 DIAGNOSIS — J4520 Mild intermittent asthma, uncomplicated: Secondary | ICD-10-CM | POA: Diagnosis not present

## 2015-02-08 NOTE — Telephone Encounter (Signed)
Pt states she is in a lot of discomfort and she knows its from her arthritis. Pt wants to know if she can get a referral to rheumatologist . You do not have any available openings until 02/20/15 for an appt.

## 2015-02-08 NOTE — Telephone Encounter (Signed)
She may need Ortho and not Rheumatologist. It will take her month to see Rheumatologist

## 2015-02-08 NOTE — Progress Notes (Signed)
Daily Session Note  Patient Details  Name: Antonisha Waskey MRN: [id number] Date of Birth: 17-Nov-1945 Referring Provider:  Erby Pian, MD  Encounter Date: 02/08/2015  Check In:     Session Check In - 02/08/15 1250    Check-In   Staff Present Lestine Box BS, ACSM EP-C, Exercise Physiologist;Laureen Janell Quiet, RRT, Respiratory Therapist   ER physicians immediately available to respond to emergencies [facility] immediately available ER MD   Physician(s) gayle and schaevit   Medication changes reported     No   Fall or balance concerns reported    No   Warm-up and Cool-down Performed on first and last piece of equipment   VAD Patient? No   Pain Assessment   Currently in Pain? No/denies         Goals Met:  Proper associated with RPD/PD & O2 Sat Exercise tolerated well No report of cardiac concerns or symptoms Strength training completed today  Goals Unmet:  Not Applicable  Goals Comments: Ms Wisecup has decreased her exercise goals due to her arthritis flare. She is waiting for an appointment with a rheumatologist from her primary care physician.   Dr. Emily Filbert is Medical Director for [facility] and [facility].

## 2015-02-09 ENCOUNTER — Other Ambulatory Visit: Payer: Self-pay | Admitting: Family Medicine

## 2015-02-09 DIAGNOSIS — M159 Polyosteoarthritis, unspecified: Secondary | ICD-10-CM

## 2015-02-09 DIAGNOSIS — M150 Primary generalized (osteo)arthritis: Principal | ICD-10-CM

## 2015-02-09 NOTE — Telephone Encounter (Signed)
Can you please put the referral in for this patient to see Orthopedics?

## 2015-02-10 ENCOUNTER — Encounter: Payer: Self-pay | Admitting: Family Medicine

## 2015-02-10 ENCOUNTER — Ambulatory Visit (INDEPENDENT_AMBULATORY_CARE_PROVIDER_SITE_OTHER): Payer: Medicare Other | Admitting: Family Medicine

## 2015-02-10 VITALS — BP 138/84 | HR 88 | Temp 98.2°F | Resp 14 | Ht 67.0 in | Wt 208.3 lb

## 2015-02-10 DIAGNOSIS — M791 Myalgia, unspecified site: Secondary | ICD-10-CM

## 2015-02-10 DIAGNOSIS — H53149 Visual discomfort, unspecified: Secondary | ICD-10-CM

## 2015-02-10 DIAGNOSIS — R5383 Other fatigue: Secondary | ICD-10-CM | POA: Diagnosis not present

## 2015-02-10 DIAGNOSIS — M2550 Pain in unspecified joint: Secondary | ICD-10-CM | POA: Diagnosis not present

## 2015-02-10 DIAGNOSIS — H5319 Other subjective visual disturbances: Secondary | ICD-10-CM

## 2015-02-10 MED ORDER — GLUCOSE BLOOD VI STRP: 1.0000 | ORAL_STRIP | Freq: Two times a day (BID) | Status: DC

## 2015-02-10 NOTE — Telephone Encounter (Signed)
Patient is coming in for a appointment today at 3:45 p.m. 02/10/15, appointment was already made for Orthopedics. Patient is scheduled to go to [facility] on Monday February 20, 2015 at 1:45 p.m. With Dr. Tamala Julian.

## 2015-02-10 NOTE — Progress Notes (Signed)
Name: Mary Cantrell   MRN: [id number]    DOB: [birth year]   Date:02/10/2015       Progress Note  Subjective  Chief Complaint  Chief Complaint  Patient presents with  . Joint Pain    onset 3 weeks worsening, patient states she does have arthritis and has been doing pulmonary rehab workouts since June    HPI  Myalgia/arthralgia/fatigue/photophobia: she woke up three weeks ago with generalized body aches, fatigue, joint stiffness, arthralgia, muscle ache. It was so severe that she was unable to go to her pulmonary rehab sessions.  She denies any tick bites, no change in medications.  She went to [place] on vacation one month ago and symptoms started about one week later - she drove to [place], but symptoms did not start right away.  There is no redness or increase in warmth but she feels like her joints burns.    Patient Active Problem List   Diagnosis Date Noted  . Benign essential HTN 12/21/2014  . Back pain, chronic 12/21/2014  . Narrowing of intervertebral disc space 12/21/2014  . Diabetic gastroparesis 12/21/2014  . Amaurosis fugax of left eye 12/21/2014  . H/O: hypothyroidism 12/21/2014  . Hyperlipidemia 12/21/2014  . IBS (irritable bowel syndrome) 12/21/2014  . Disorder of macula of retina 12/21/2014  . Asthma, moderate 12/21/2014  . NASH (nonalcoholic steatohepatitis) 12/21/2014  . NS (nuclear sclerosis) 12/21/2014  . Osteoarthrosis 12/21/2014  . Obesity (BMI 30-39.9) 12/21/2014  . Psoriasis 12/21/2014  . Knee torn cartilage 12/21/2014  . Closed dislocation of jaw 12/21/2014  . Diabetes mellitus type 2 in obese 12/21/2014  . Obstructive apnea 01/03/2014  . History of shoulder surgery 04/23/2011    Past Surgical History  Procedure Laterality Date  . Tonsillectomy    . Abdominal hysterectomy    . Appendectomy    . Pilonidal cyst excision    . Shoulder arthroscopy Right [id number]    Dr. Mauri Pole  . Excision / curettage bone cyst finger      Left Index Finger    Family History   Problem Relation Age of Onset  . Rheum arthritis Father   . Diabetes Father   . Pneumonia Father   . Diabetes Sister   . Osteoarthritis Sister   . Pneumonia Mother     Social History   Social History  . Marital Status: Married    Spouse Name: N/A  . Number of Children: N/A  . Years of Education: N/A   Occupational History  . Not on file.   Social History Main Topics  . Smoking status: Never Smoker   . Smokeless tobacco: Never Used  . Alcohol Use: No  . Drug Use: No  . Sexual Activity:    Partners: Male   Other Topics Concern  . Not on file   Social History Narrative     Current outpatient prescriptions:  .  albuterol (VENTOLIN HFA) 108 (90 BASE) MCG/ACT inhaler, Inhale into the lungs., Disp: , Rfl:  .  aspirin 81 MG tablet, Take 81 mg by mouth daily., Disp: , Rfl:  .  budesonide-formoterol (SYMBICORT) 160-4.5 MCG/ACT inhaler, Inhale into the lungs., Disp: , Rfl:  .  Cinnamon 500 MG capsule, Take by mouth., Disp: , Rfl:  .  cyanocobalamin 100 MCG tablet, Take 100 mcg by mouth daily., Disp: , Rfl:  .  diphenhydrAMINE (BENADRYL) 25 MG tablet, Take 1 tablet by mouth daily., Disp: , Rfl:  .  fluticasone (FLONASE) 50 MCG/ACT nasal spray, Place into the nose., Disp: ,  Rfl:  .  gabapentin (NEURONTIN) 100 MG capsule, Take 1 capsule (100 mg total) by mouth 3 (three) times daily., Disp: 180 capsule, Rfl: 4 .  glipiZIDE (GLIPIZIDE XL) 5 MG 24 hr tablet, Take 1 tablet (5 mg total) by mouth daily with breakfast., Disp: 90 tablet, Rfl: 0 .  glucose blood (ONE TOUCH ULTRA TEST) test strip, 1 each by Other route 2 (two) times daily. Use as instructed, Disp: 100 each, Rfl: 2 .  loratadine (CLARITIN) 10 MG tablet, Take 1 tablet (10 mg total) by mouth daily., Disp: 60 tablet, Rfl: 0 .  losartan (COZAAR) 50 MG tablet, TAKE 1 TABLET DAILY FOR BLOOD PRESSURE, Disp: 90 tablet, Rfl: 0 .  meloxicam (MOBIC) 7.5 MG tablet, Take by mouth., Disp: , Rfl:  .  metFORMIN (GLUCOPHAGE-XR) 500 MG 24 hr  tablet, One in am and 2 in pm, Disp: 90 tablet, Rfl: 2 .  pravastatin (PRAVACHOL) 40 MG tablet, Take by mouth., Disp: , Rfl:  .  ranitidine (ZANTAC) 150 MG tablet, Take 1 tablet (150 mg total) by mouth 2 (two) times daily., Disp: 60 tablet, Rfl: 0 .  spironolactone-hydrochlorothiazide (ALDACTAZIDE) 25-25 MG per tablet, TAKE ONE-HALF (1/2) TO ONE TABLET DAILY FOR BLOOD PRESSURE, Disp: 90 tablet, Rfl: 1 .  vitamin C (ASCORBIC ACID) 500 MG tablet, Take 1 tablet by mouth daily., Disp: , Rfl:   Allergies  Allergen Reactions  . Codeine     Other reaction(s): Other (See Comments) Dizzy  . Lantus  [Insulin Glargine]     made skin hot  . Metformin   . Sitagliptin   . Sulfa Antibiotics     Other reaction(s): Other (See Comments) Red burning skin     ROS  Constitutional: Negative for fever or weight change.  Respiratory: Negative for cough and shortness of breath.   Cardiovascular: Negative for chest pain or palpitations.  Gastrointestinal: Negative for abdominal pain, no bowel changes.  Musculoskeletal: Positive  for gait problem - using a cane , joint pains Skin: Negative for rash.  Neurological: Negative for dizziness occasionally  Headache and photophobia  No other specific complaints in a complete review of systems (except as listed in HPI above).  Objective  Filed Vitals:   02/10/15 1554  BP: 138/84  Pulse: 88  Temp: 98.2 F (36.8 C)  TempSrc: Oral  Resp: 14  Height: _0  (1.702 m)  Weight: 208 lb 4.8 oz (94.484 kg)  SpO2: 96%    Body mass index is 32.62 kg/(m^2).  Physical Exam  Constitutional: Patient appears well-developed and well-nourished. Obese No distress, but uncomfortable, moving around on her chair.  HEENT: head atraumatic, normocephalic, pupils equal and reactive to light, neck supple, throat within normal limits Mild tenderness on left temporal area during palpation but temporal artery is not palpable Cardiovascular: Normal rate, regular rhythm and normal  heart sounds.  No murmur heard. No BLE edema. Pulmonary/Chest: Effort normal and breath sounds normal. No respiratory distress. Abdominal: Soft.  There is no tenderness. Psychiatric: Patient has a normal mood and affect. behavior is normal. Judgment and thought content normal. Muscular skeletal: no synovitis, but has tender trigger points except on upper back, pain with passive and active rom of joints, no redness or increase in warmth  Recent Results (from the past 2160 hour(s))  Glucose, capillary     Status: Abnormal   Collection Time: 12/12/14  1:02 PM  Result Value Ref Range   Glucose-Capillary 117 (H) 65 - 99 mg/dL  Glucose, capillary  Status: Abnormal   Collection Time: 12/14/14 11:40 AM  Result Value Ref Range   Glucose-Capillary 218 (H) 65 - 99 mg/dL  Glucose, capillary     Status: Abnormal   Collection Time: 12/14/14 12:39 PM  Result Value Ref Range   Glucose-Capillary 159 (H) 65 - 99 mg/dL  Glucose, capillary     Status: Abnormal   Collection Time: 12/16/14 12:05 PM  Result Value Ref Range   Glucose-Capillary 203 (H) 65 - 99 mg/dL  Glucose, capillary     Status: Abnormal   Collection Time: 12/16/14  1:11 PM  Result Value Ref Range   Glucose-Capillary 113 (H) 65 - 99 mg/dL     PHQ2/9: Depression screen [facility] 2/9 12/21/2014 12/06/2014  Decreased Interest 0 0  Down, Depressed, Hopeless 0 0  PHQ - 2 Score 0 0    Fall Risk: Fall Risk  12/06/2014  Falls in the past year? No  Risk for fall due to : Impaired balance/gait     Assessment & Plan  1. Myalgia Check labs, possible giant cell arteritis, but sick for 3 weeks, we will check sed rate and if high we will start high dose steroid therapy asap, go to [facility] if eye disturbance or worsening of symptoms.   - Sed Rate (ESR) - CK (Creatine Kinase)  2. Arthralgia  - Sed Rate (ESR) - CK (Creatine Kinase)  3. Photophobia Intermittently   4. Other fatigue  - CBC with Differential/Platelet - Comprehensive  metabolic panel

## 2015-02-10 NOTE — Telephone Encounter (Signed)
Pt is checking status on the placement for the referral. States she is in a lot of pain. States that if you need her to come in she will make an appointment. States she will probably need you to run a lot of test just to see if it is her arthritis that is flaring up and not something else. Also, about 3 weeks ago the wrong authorization was placed for her test strips and she would like a corrected one sent to her pharmacy. Please return call

## 2015-02-11 ENCOUNTER — Other Ambulatory Visit: Payer: Self-pay | Admitting: Family Medicine

## 2015-02-11 DIAGNOSIS — M2550 Pain in unspecified joint: Secondary | ICD-10-CM

## 2015-02-11 DIAGNOSIS — M791 Myalgia, unspecified site: Secondary | ICD-10-CM

## 2015-02-11 LAB — COMPREHENSIVE METABOLIC PANEL
A/G RATIO: 2 (ref 1.1–2.5)
ALBUMIN: 4.6 g/dL (ref 3.5–4.8)
ALT: 19 IU/L (ref 0–32)
AST: 21 IU/L (ref 0–40)
Alkaline Phosphatase: 98 IU/L (ref 39–117)
BUN/Creatinine Ratio: 18 (ref 11–26)
BUN: 16 mg/dL (ref 8–27)
Bilirubin Total: 0.3 mg/dL (ref 0.0–1.2)
CALCIUM: 10 mg/dL (ref 8.7–10.3)
CO2: 29 mmol/L (ref 18–29)
Chloride: 99 mmol/L (ref 97–108)
Creatinine, Ser: 0.9 mg/dL (ref 0.57–1.00)
GFR, EST AFRICAN AMERICAN: 75 mL/min/{1.73_m2} (ref >59)
GFR, EST NON AFRICAN AMERICAN: 65 mL/min/{1.73_m2} (ref >59)
GLOBULIN, TOTAL: 2.3 g/dL (ref 1.5–4.5)
Glucose: 93 mg/dL (ref 65–99)
POTASSIUM: 3.7 mmol/L (ref 3.5–5.2)
SODIUM: 144 mmol/L (ref 134–144)
TOTAL PROTEIN: 6.9 g/dL (ref 6.0–8.5)

## 2015-02-11 LAB — CBC WITH DIFFERENTIAL/PLATELET
BASOS ABS: 0 10*3/uL (ref 0.0–0.2)
Basos: 0 %
EOS (ABSOLUTE): 0.3 10*3/uL (ref 0.0–0.4)
Eos: 3 %
HEMATOCRIT: 40 % (ref 34.0–46.6)
HEMOGLOBIN: 13.7 g/dL (ref 11.1–15.9)
Immature Grans (Abs): 0 10*3/uL (ref 0.0–0.1)
Immature Granulocytes: 0 %
LYMPHS ABS: 1.9 10*3/uL (ref 0.7–3.1)
Lymphs: 19 %
MCH: 29.9 pg (ref 26.6–33.0)
MCHC: 34.3 g/dL (ref 31.5–35.7)
MCV: 87 fL (ref 79–97)
MONOCYTES: 8 %
Monocytes Absolute: 0.8 10*3/uL (ref 0.1–0.9)
NEUTROS ABS: 6.9 10*3/uL (ref 1.4–7.0)
Neutrophils: 70 %
Platelets: 392 10*3/uL — ABNORMAL HIGH (ref 150–379)
RBC: 4.58 x10E6/uL (ref 3.77–5.28)
RDW: 13.4 % (ref 12.3–15.4)
WBC: 10 10*3/uL (ref 3.4–10.8)

## 2015-02-11 LAB — SEDIMENTATION RATE: Sed Rate: 16 mm/hr (ref 0–40)

## 2015-02-11 LAB — CK: Total CK: 79 U/L (ref 24–173)

## 2015-02-11 MED ORDER — PREDNISONE 10 MG PO TABS: 10.0000 mg | ORAL_TABLET | Freq: Two times a day (BID) | ORAL | Status: DC

## 2015-02-13 ENCOUNTER — Other Ambulatory Visit: Payer: Self-pay | Admitting: Family Medicine

## 2015-02-13 ENCOUNTER — Other Ambulatory Visit: Payer: Self-pay

## 2015-02-13 ENCOUNTER — Telehealth: Payer: Self-pay

## 2015-02-13 DIAGNOSIS — M791 Myalgia, unspecified site: Secondary | ICD-10-CM

## 2015-02-13 DIAGNOSIS — M2550 Pain in unspecified joint: Secondary | ICD-10-CM

## 2015-02-13 NOTE — Telephone Encounter (Signed)
Please  Order blood work C reactive protein it cannot be added to labs! She will come in this afternoon to pick up

## 2015-02-14 ENCOUNTER — Other Ambulatory Visit: Payer: Self-pay | Admitting: Family Medicine

## 2015-02-14 DIAGNOSIS — M791 Myalgia, unspecified site: Secondary | ICD-10-CM

## 2015-02-14 DIAGNOSIS — R7982 Elevated C-reactive protein (CRP): Secondary | ICD-10-CM

## 2015-02-14 LAB — C-REACTIVE PROTEIN: CRP: 10.2 mg/L — ABNORMAL HIGH (ref 0.0–4.9)

## 2015-02-14 LAB — ANA: Anti Nuclear Antibody(ANA): NEGATIVE

## 2015-02-14 NOTE — Progress Notes (Signed)
Patient notified about labs and referral which is being processed by [facility].

## 2015-02-14 NOTE — Progress Notes (Signed)
Patient notified

## 2015-02-15 ENCOUNTER — Encounter: Payer: Medicare Other | Admitting: *Deleted

## 2015-02-15 DIAGNOSIS — G4730 Sleep apnea, unspecified: Secondary | ICD-10-CM

## 2015-02-15 DIAGNOSIS — J4520 Mild intermittent asthma, uncomplicated: Secondary | ICD-10-CM | POA: Diagnosis not present

## 2015-02-15 NOTE — Progress Notes (Signed)
Daily Session Note  Patient Details  Name: Mary Cantrell MRN: [id number] Date of Birth: [birth year] Referring Provider:  Steele Sizer, MD  Encounter Date: 02/15/2015  Check In:     Session Check In - 02/15/15 1209    Check-In   Staff Present Carson Myrtle BS, RRT, Respiratory Therapist;Steven Way BS, ACSM EP-C, Exercise Physiologist;Renee Dillard Essex MS, ACSM CEP Exercise Physiologist   ER physicians immediately available to respond to emergencies [facility] immediately available ER MD   Physician(s) Dr. Joni Fears and Dr. Reita Cliche   Medication changes reported     No   Fall or balance concerns reported    No   Warm-up and Cool-down Performed on first and last piece of equipment   VAD Patient? No   Pain Assessment   Currently in Pain? Yes   Pain Score 4    Pain Location Knee   Pain Orientation Right   Pain Onset More than a month ago   Pain Frequency Constant   Effect of Pain on Daily Activities Knee brace         Goals Met:  Proper associated with RPD/PD & O2 Sat Exercise tolerated well  Goals Unmet:  Not Applicable  Goals Comments: Right knee hurts and she wears a brace and pushes through with exercise since she said it helps her breathing.    Dr. Emily Filbert is Medical Director for [place] and [facility].

## 2015-02-16 ENCOUNTER — Telehealth: Payer: Self-pay

## 2015-02-16 NOTE — Telephone Encounter (Signed)
Patient was notified of her referral for [facility] on 03/08/15 at 10:45 a.m. With Dr. Meda Coffee, and also was informed a letter was being sent to her house with all the information. Patient states the medication is helping her symptoms and has 7 left. Patient has a follow up with Dr. Ancil Boozer on Monday 02/20/15.

## 2015-02-17 ENCOUNTER — Encounter: Payer: Medicare Other | Admitting: *Deleted

## 2015-02-17 DIAGNOSIS — G4730 Sleep apnea, unspecified: Secondary | ICD-10-CM

## 2015-02-17 DIAGNOSIS — J4520 Mild intermittent asthma, uncomplicated: Secondary | ICD-10-CM | POA: Diagnosis not present

## 2015-02-17 NOTE — Progress Notes (Signed)
Daily Session Note  Patient Details  Name: Mary Cantrell MRN: [id number] Date of Birth: 02-09-1946 Referring Provider:  Erby Pian, MD  Encounter Date: 02/17/2015  Check In:     Session Check In - 02/17/15 1158    Check-In   Staff Present Frederich Cha RRT, RCP Respiratory Therapist;Renee Dillard Essex MS, ACSM CEP Exercise Physiologist;Ilamae Geng RN, BSN   ER physicians immediately available to respond to emergencies [facility] immediately available ER MD   Physician(s) Dr. Jimmye Norman and Dr. Thomasene Lot    Medication changes reported     No   Fall or balance concerns reported    No   Warm-up and Cool-down Performed on first and last piece of equipment   VAD Patient? No   Pain Assessment   Currently in Pain? No/denies         Goals Met:  Proper associated with RPD/PD & O2 Sat Independence with exercise equipment Exercise tolerated well Strength training completed today  Goals Unmet:  Not Applicable  Goals Comments: Mary Cantrell is only using the TM at her Pulmonary Rehab sessions.  She is having pain from arteritis and arthritis.  She has decreased her speed but increased her time on the TM.   Dr. Emily Filbert is Medical Director for [place] and [facility].

## 2015-02-20 ENCOUNTER — Ambulatory Visit (INDEPENDENT_AMBULATORY_CARE_PROVIDER_SITE_OTHER): Payer: Medicare Other | Admitting: Family Medicine

## 2015-02-20 ENCOUNTER — Encounter: Payer: Self-pay | Admitting: Family Medicine

## 2015-02-20 VITALS — BP 118/76 | HR 87 | Temp 98.6°F | Resp 16 | Ht 67.0 in | Wt 203.5 lb

## 2015-02-20 DIAGNOSIS — H5319 Other subjective visual disturbances: Secondary | ICD-10-CM | POA: Diagnosis not present

## 2015-02-20 DIAGNOSIS — M791 Myalgia, unspecified site: Secondary | ICD-10-CM

## 2015-02-20 DIAGNOSIS — R7982 Elevated C-reactive protein (CRP): Secondary | ICD-10-CM | POA: Diagnosis not present

## 2015-02-20 DIAGNOSIS — H53149 Visual discomfort, unspecified: Secondary | ICD-10-CM

## 2015-02-20 DIAGNOSIS — M2550 Pain in unspecified joint: Secondary | ICD-10-CM | POA: Diagnosis not present

## 2015-02-20 DIAGNOSIS — G4489 Other headache syndrome: Secondary | ICD-10-CM | POA: Diagnosis not present

## 2015-02-20 MED ORDER — PREDNISONE 10 MG PO TABS: 15.0000 mg | ORAL_TABLET | Freq: Every day | ORAL | Status: DC

## 2015-02-20 NOTE — Progress Notes (Signed)
Name: Mary Cantrell   MRN: [id number]    DOB: June 18, 1946   Date:02/20/2015       Progress Note  Subjective  Chief Complaint  Chief Complaint  Patient presents with  . Myalgia    Prednisone has improved symptoms when taking 2 a day, but when decreased tablets a day pain and stiffness as came back. Patient also states while taking Prednisone her BS has been running low, in the 84-109's and states her BS is normally 140's in the am.     HPI  Myalgia and Arthralgia: she was seen August 19th, 2016 with complaints of myalgia, arthralgias, morning stiffness, weight loss, fatigue. Labs were ordered C-reactive protein was high, we started her on prednisone and while on 1m daily symptoms improved significantly but when it went down 10 mg daily the pain went up to 7. Glucose is still better controlled with prednisone since the pain has decreased, she has been taking Glipizide prn  She has follow up with Rheumatologist in 2 weeks    Patient Active Problem List   Diagnosis Date Noted  . Benign essential HTN 12/21/2014  . Back pain, chronic 12/21/2014  . Narrowing of intervertebral disc space 12/21/2014  . Diabetic gastroparesis 12/21/2014  . Amaurosis fugax of left eye 12/21/2014  . H/O: hypothyroidism 12/21/2014  . Hyperlipidemia 12/21/2014  . IBS (irritable bowel syndrome) 12/21/2014  . Disorder of macula of retina 12/21/2014  . Asthma, moderate 12/21/2014  . NASH (nonalcoholic steatohepatitis) 12/21/2014  . NS (nuclear sclerosis) 12/21/2014  . Osteoarthrosis 12/21/2014  . Obesity (BMI 30-39.9) 12/21/2014  . Psoriasis 12/21/2014  . Knee torn cartilage 12/21/2014  . Closed dislocation of jaw 12/21/2014  . Diabetes mellitus type 2 in obese 12/21/2014  . Obstructive apnea 01/03/2014  . History of shoulder surgery 04/23/2011    Past Surgical History  Procedure Laterality Date  . Tonsillectomy    . Abdominal hysterectomy    . Appendectomy    . Pilonidal cyst excision    . Shoulder  arthroscopy Right [id number]   Dr. CMauri Pole . Excision / curettage bone cyst finger      Left Index Finger    Family History  Problem Relation Age of Onset  . Rheum arthritis Father   . Diabetes Father   . Pneumonia Father   . Diabetes Sister   . Osteoarthritis Sister   . Pneumonia Mother     Social History   Social History  . Marital Status: Married    Spouse Name: N/A  . Number of Children: N/A  . Years of Education: N/A   Occupational History  . Not on file.   Social History Main Topics  . Smoking status: Never Smoker   . Smokeless tobacco: Never Used  . Alcohol Use: No  . Drug Use: No  . Sexual Activity:    Partners: Male   Other Topics Concern  . Not on file   Social History Narrative     Current outpatient prescriptions:  .  albuterol (VENTOLIN HFA) 108 (90 BASE) MCG/ACT inhaler, Inhale into the lungs., Disp: , Rfl:  .  aspirin 81 MG tablet, Take 81 mg by mouth daily., Disp: , Rfl:  .  budesonide-formoterol (SYMBICORT) 160-4.5 MCG/ACT inhaler, Inhale into the lungs., Disp: , Rfl:  .  Cinnamon 500 MG capsule, Take by mouth., Disp: , Rfl:  .  cyanocobalamin 100 MCG tablet, Take 100 mcg by mouth daily., Disp: , Rfl:  .  diphenhydrAMINE (BENADRYL) 25 MG tablet, Take 1  tablet by mouth daily., Disp: , Rfl:  .  fluticasone (FLONASE) 50 MCG/ACT nasal spray, Place into the nose., Disp: , Rfl:  .  gabapentin (NEURONTIN) 100 MG capsule, Take 1 capsule (100 mg total) by mouth 3 (three) times daily., Disp: 180 capsule, Rfl: 4 .  glipiZIDE (GLIPIZIDE XL) 5 MG 24 hr tablet, Take 1 tablet (5 mg total) by mouth daily with breakfast., Disp: 90 tablet, Rfl: 0 .  glucose blood (ONE TOUCH ULTRA TEST) test strip, 1 each by Other route 2 (two) times daily. Use as instructed, Disp: 100 each, Rfl: 2 .  loratadine (CLARITIN) 10 MG tablet, Take 1 tablet (10 mg total) by mouth daily., Disp: 60 tablet, Rfl: 0 .  losartan (COZAAR) 50 MG tablet, TAKE 1 TABLET DAILY FOR BLOOD PRESSURE,  Disp: 90 tablet, Rfl: 0 .  meloxicam (MOBIC) 7.5 MG tablet, Take by mouth., Disp: , Rfl:  .  metFORMIN (GLUCOPHAGE-XR) 500 MG 24 hr tablet, One in am and 2 in pm, Disp: 90 tablet, Rfl: 2 .  pravastatin (PRAVACHOL) 40 MG tablet, Take by mouth., Disp: , Rfl:  .  predniSONE (DELTASONE) 10 MG tablet, Take 1 tablet (10 mg total) by mouth 2 (two) times daily. Take second dose by 5 pm, Disp: 15 tablet, Rfl: 0 .  ranitidine (ZANTAC) 150 MG tablet, Take 1 tablet (150 mg total) by mouth 2 (two) times daily., Disp: 60 tablet, Rfl: 0 .  spironolactone-hydrochlorothiazide (ALDACTAZIDE) 25-25 MG per tablet, TAKE ONE-HALF (1/2) TO ONE TABLET DAILY FOR BLOOD PRESSURE, Disp: 90 tablet, Rfl: 1 .  vitamin C (ASCORBIC ACID) 500 MG tablet, Take 1 tablet by mouth daily., Disp: , Rfl:   Allergies  Allergen Reactions  . Codeine     Other reaction(s): Other (See Comments) Dizzy  . Lantus  [Insulin Glargine]     made skin hot  . Metformin   . Sitagliptin   . Sulfa Antibiotics     Other reaction(s): Other (See Comments) Red burning skin     ROS  Constitutional: Negative for fever positive for  weight change.  Respiratory: Negative for cough and shortness of breath.  Cardiovascular: Negative for chest pain or palpitations.  Gastrointestinal: Negative for abdominal pain, no bowel changes.  Musculoskeletal: Positive for gait problem -  joint pains Skin: Negative for rash.  Neurological: Negative for dizziness, still has headache and photophobia No other specific complaints in a complete review of systems (except as listed in HPI above).  Objective  Filed Vitals:   02/20/15 1131  BP: 118/76  Pulse: 87  Temp: 98.6 F (37 C)  TempSrc: Oral  Resp: 16  Height: 5' 7"  (1.702 m)  Weight: 203 lb 8 oz (92.307 kg)  SpO2: 96%    Body mass index is 31.87 kg/(m^2).  Physical Exam  Constitutional: Patient appears well-developed and well-nourished. Obese No distress HEENT: head atraumatic, normocephalic,  pupils equal and reactive to light, neck supple, throat within normal limits Mild tenderness on left temporal area during palpation but temporal artery is not palpable Cardiovascular: Normal rate, regular rhythm and normal heart sounds. No murmur heard. No BLE edema. Pulmonary/Chest: Effort normal and breath sounds normal. No respiratory distress. Abdominal: Soft. There is no tenderness. Psychiatric: Patient has a normal mood and affect. behavior is normal. Judgment and thought content normal. Muscular skeletal: no synovitis, but has tender trigger points except on upper back, pain with passive and active rom of joints, no redness or increase in warmth  Recent Results (from the past  2160 hour(s))  Glucose, capillary     Status: Abnormal   Collection Time: 12/12/14  1:02 PM  Result Value Ref Range   Glucose-Capillary 117 (H) 65 - 99 mg/dL  Glucose, capillary     Status: Abnormal   Collection Time: 12/14/14 11:40 AM  Result Value Ref Range   Glucose-Capillary 218 (H) 65 - 99 mg/dL  Glucose, capillary     Status: Abnormal   Collection Time: 12/14/14 12:39 PM  Result Value Ref Range   Glucose-Capillary 159 (H) 65 - 99 mg/dL  Glucose, capillary     Status: Abnormal   Collection Time: 12/16/14 12:05 PM  Result Value Ref Range   Glucose-Capillary 203 (H) 65 - 99 mg/dL  Glucose, capillary     Status: Abnormal   Collection Time: 12/16/14  1:11 PM  Result Value Ref Range   Glucose-Capillary 113 (H) 65 - 99 mg/dL  CBC with Differential/Platelet     Status: Abnormal   Collection Time: 02/10/15  4:45 PM  Result Value Ref Range   WBC 10.0 3.4 - 10.8 x10E3/uL   RBC 4.58 3.77 - 5.28 x10E6/uL   Hemoglobin 13.7 11.1 - 15.9 g/dL   Hematocrit 40.0 34.0 - 46.6 %   MCV 87 79 - 97 fL   MCH 29.9 26.6 - 33.0 pg   MCHC 34.3 31.5 - 35.7 g/dL   RDW 13.4 12.3 - 15.4 %   Platelets 392 (H) 150 - 379 x10E3/uL   Neutrophils 70 %   Lymphs 19 %   Monocytes 8 %   Eos 3 %   Basos 0 %   Neutrophils Absolute  6.9 1.4 - 7.0 x10E3/uL   Lymphocytes Absolute 1.9 0.7 - 3.1 x10E3/uL   Monocytes Absolute 0.8 0.1 - 0.9 x10E3/uL   EOS (ABSOLUTE) 0.3 0.0 - 0.4 x10E3/uL   Basophils Absolute 0.0 0.0 - 0.2 x10E3/uL   Immature Granulocytes 0 %   Immature Grans (Abs) 0.0 0.0 - 0.1 x10E3/uL  Sed Rate (ESR)     Status: None   Collection Time: 02/10/15  4:45 PM  Result Value Ref Range   Sed Rate 16 0 - 40 mm/hr  CK (Creatine Kinase)     Status: None   Collection Time: 02/10/15  4:45 PM  Result Value Ref Range   Total CK 79 24 - 173 U/L  Comprehensive metabolic panel     Status: None   Collection Time: 02/10/15  4:45 PM  Result Value Ref Range   Glucose 93 65 - 99 mg/dL   BUN 16 8 - 27 mg/dL   Creatinine, Ser 0.90 0.57 - 1.00 mg/dL   GFR calc non Af Amer 65 >59 mL/min/1.73   GFR calc Af Amer 75 >59 mL/min/1.73   BUN/Creatinine Ratio 18 11 - 26   Sodium 144 134 - 144 mmol/L   Potassium 3.7 3.5 - 5.2 mmol/L   Chloride 99 97 - 108 mmol/L   CO2 29 18 - 29 mmol/L   Calcium 10.0 8.7 - 10.3 mg/dL   Total Protein 6.9 6.0 - 8.5 g/dL   Albumin 4.6 3.5 - 4.8 g/dL   Globulin, Total 2.3 1.5 - 4.5 g/dL   Albumin/Globulin Ratio 2.0 1.1 - 2.5   Bilirubin Total 0.3 0.0 - 1.2 mg/dL   Alkaline Phosphatase 98 39 - 117 IU/L   AST 21 0 - 40 IU/L   ALT 19 0 - 32 IU/L  C-reactive protein     Status: Abnormal   Collection Time: 02/13/15  2:17  PM  Result Value Ref Range   CRP 10.2 (H) 0.0 - 4.9 mg/L  ANA     Status: None   Collection Time: 02/13/15  2:17 PM  Result Value Ref Range   Anit Nuclear Antibody(ANA) Negative Negative      PHQ2/9: Depression screen [facility] 2/9 02/20/2015 12/21/2014 12/06/2014  Decreased Interest 0 0 0  Down, Depressed, Hopeless 0 0 0  PHQ - 2 Score 0 0 0     Fall Risk: Fall Risk  02/20/2015 12/06/2014  Falls in the past year? No No  Risk for fall due to : - Impaired balance/gait      Assessment & Plan  1. Myalgia Improved with medication, it may be polymyalgia rheumatica, she also  has headaches and photophobia, we will send her for temporal artery biopsy also - seeing Dr. Lucky Cowboy tomorrow - predniSONE (DELTASONE) 10 MG tablet; Take 1.5 tablets (15 mg total) by mouth daily. Take second dose by 5 pm  Dispense: 30 tablet; Refill: 0  2. Arthralgia  - predniSONE (DELTASONE) 10 MG tablet; Take 1.5 tablets (15 mg total) by mouth daily. Take second dose by 5 pm  Dispense: 30 tablet; Refill: 0  3. Photophobia Dr. Lucky Cowboy is seeing her tomorrow and we will request a temporal artery biopsy  4. Elevated C-reactive protein Has follow up with Rheumatologist   5. Other headache syndrome

## 2015-02-20 NOTE — Patient Instructions (Addendum)
Polymyalgia Rheumatica Polymyalgia rheumatica (also called PMR or polymyalgia) is a rheumatologic (arthritic) condition that causes pain and morning stiffness in your neck, shoulders, and hips. It is an inflammatory condition. In some people, inflammation of certain structures in the shoulder, hips, or other joints can be seen on special testing. It does not cause joint destruction, as occurs in other arthritic conditions. It usually occurs after 70 years of age, and is more common as you age. It can be confused with several other diseases, but it is usually easily treated. People with PMR often have, or can develop, a more severe rheumatologic condition called giant cell arteritis (also called CGA or temporal arteritis).  CAUSES  The exact cause of PMR is not known.   There are genetic factors involved.  Viruses have been suspected in the cause of PMR. This has not been proven. SYMPTOMS   Aching, pain, and morning stiffness your neck, both shoulders, or both hips.  Symptoms usually start slowly and build gradually.  Morning stiffness usually lasts at least 30 minutes.  Swelling and tenderness in other joints of the arms, hands, legs, and feet may occur.  Swelling and inflammation in the wrists can cause nerve inflammation at the wrist (carpal tunnel syndrome).  You may also have low grade fever, fatigue, weakness, decreased appetite and weight loss. DIAGNOSIS   Your caregiver may suspect that you have PMR based on your description of your symptoms and on your exam.  Your caregiver will examine you to be sure you do not have diseases that can be confused with PMR. These diseases include rheumatoid arthritis, fibromyalgia, or thyroid disease.  Your caregiver should check for signs of giant cell arteritis. This can cause serious complications such as blindness.  Lab tests can help confirm that you have PMR and not other diseases, but are sometimes inconclusive.  X-rays cannot show PMR.  However, it can identify other diseases like rheumatoid arthritis. Your caregiver may have you see a specialist in arthritis and inflammatory diseases (rheumatologist). TREATMENT  The goal of treatment is relief of symptoms. Treatment does not shorten the course of the illness or prevent complications. With proper treatment, you usually feel better almost right away.   The initial treatment of PMR is usually a cortisone (steroid) medication. Your caregiver will help determine a starting dose. The dose is gradually reduced every few weeks to months. Treatment usually lasts one to three years.  Other stronger medications are rarely needed. They will only be prescribed if your symptoms do not get better on cortisone medication alone, or if they recur as the dose is reduced.  Cortisone medication can have different side effects. With the doses of cortisone needed for PMR, the side effects can affect bones and joints, blood sugar control in diabetes, and mood changes. Discuss this with your caregiver.  Your caregiver will evaluate you regularly during your treatment. They will do this in order to assess progress and to check for complications of the illness or treatment.  Physical therapy is sometimes useful. This is especially true if your joints are still stiff after other symptoms have improved. HOME CARE INSTRUCTIONS   Follow your caregiver's instructions. Do not change your dose of cortisone medication on your own.  Keep your appointments for follow-up lab tests and caregiver visits. Your lab tests need to be monitored. You must get checked periodically for giant cell arteritis.  Follow your caregiver's guidance regarding physical activity (usually no restrictions are needed) or physical therapy.  Your caregiver  may have instructions to prevent or check for side effects from cortisone medication (including bone density testing or treatment). Follow their instructions carefully. SEEK MEDICAL  CARE IF:   You develop any side effects from treatment. Side effects can include:  Elevated blood pressure.  High blood sugar (or worsening of diabetes, if you are diabetic).  Difficulty fighting off infections.  Weight gain.  Weakness of the bones (osteoporosis).  Your aches, pains, morning stiffness, or other symptoms get worse with time. This is especially true after your dose of cortisone is reduced.  You develop new joint symptoms (pain, swelling, etc.) SEEK IMMEDIATE MEDICAL CARE IF:   You develop a severe headache.  You start vomiting.  You have problems with your vision.  You have an oral temperature above 102 F (38.9 C), not controlled by medicine. Document Released: 07/18/2004 Document Revised: 05/27/2012 Document Reviewed: 10/31/2008 [facility] Patient Information 2015 [employer], [place]. This information is not intended to replace advice given to you by your health care provider. Make sure you discuss any questions you have with your health care provider. Temporal Arteritis Arteries are blood vessels that carry blood from the heart to all parts of the body. Temporal arteritis is a swelling (inflammation) of certain large arteries. This usually affects arteries in the head and neck area, including arteries in the area on the side of the head, between the ears and eyes (temples). The condition can be very painful. It also can cause serious problems, even blindness. Early diagnosis and treatment is very important. CAUSES  Temporal arteritis results from the body reacting to injury or infection (inflammation). This may occur when the body's immune system (which fights germs and disease) makes a mistake. It attacks its own arteries. No one knows why this happens. However, certain things (risk factors) make it more likely that a person will develop temporal arteritis. They include:  Age. Most people with temporal arteritis are older than 50. The average age is 40.  Sex. Three  times more women than men develop the condition.  Race and ethnic background. Caucasians are more likely to have temporal arteritis than other races. So are people whose families came from [place], [place] or [place]  Having polymyalgia rheumatica (PMR). This condition causes stiffness and pain in the joints of the neck, shoulders and hips. About 15% of people with PMR also have temporal arteritis. SYMPTOMS  Not everyone with temporal arteritis has the same symptoms. Some people have just one symptom. Others may have several. The most common symptom is a new headache, often in the temple region. Symptoms may show up in other parts of the body too.   Symptoms affecting the head may include:  Temporal arteries that feel hard or swollen. It may hurt when the temples are touched.  Pain when combing your hair, or when laying your head on a pillow.  Pain in the jaw when chewing.  Pain in the throat or tongue.  Visual problems, including sudden loss of vision in one eye, or seeing double.  Symptoms in other parts of the body may include:  Fever.  Fatigue.  A dry cough.  Pain in the hips and shoulders.  Pain in the arms during exercise.  Depression.  Weight loss. DIAGNOSIS  Symptoms of temporal arteritis are similar to symptoms for other conditions. This can make it hard to tell if you have the condition. To be sure, your caregiver will ask about your symptoms and do a physical exam. Certain tests may  be necessary, such as:   An exam of your temples. Often, the temporal arteries will be swollen and hard. This can be felt.  A complete blood count. This test shows how many red blood cells are in your blood. Most people with temporal arteritis do not have enough red blood cells (anemic).  Erythrocyte sedimentation (also called sed rate test). It measures inflammation in the body. Almost everyone with temporal arteritis has a high sed rate.  C reactive  protein test. This also shows if there is inflammation.  Biopsy a temporal artery. This means the caregiver will take out a small piece of an artery. Then, it is checked under a microscope for inflammation. More than one biopsy may be needed. That is because inflammation can be in one part of an artery and not in others. Your caregiver may need to check more than one spot. TREATMENT  Starting treatment right away is very important. Often, you will need to see a specialist in immunologic diseases (rheumatologist). Goals of treatment include protecting your eyesight. Once vision is gone, it might not come back. The normal treatment is medication. It usually works well and quickly. Most people start getting better in a few days. Medication options include:  Corticosteroids. These are powerful drugs that fight inflammation. These drugs are most often used to treat temporal arteritis.  Usually, a high dose is taken at first. After symptoms improve, a smaller dose is used. The goal is to take the smallest dose possible and still control your symptoms. That is because using corticosteroids for a long time can cause problems. They can make muscles and bones weak. They can cause blood pressure to go up, and cause diabetes. Also, people often gain weight when they take corticosteroids. Corticosteroids may need to be taken for one or two years.  Several newer drugs are being tested to treat temporal arteritis. Researchers are testing to see if new drugs will work as well as corticosteroids, but cause fewer problems than them. Testing of these drugs is not yet complete.  Some specialists recommend low dose aspirin to prevent blood clots. HOME CARE INSTRUCTIONS   Take any corticosteroids that your caregiver prescribes. Follow the directions carefully.  Take any vitamins or supplements that your caregiver suggests. This may include vitamin D and calcium. They help keep your bones from becoming weak.  Keep all  appointments for checkups. Your caregiver will watch for any problems from the medication. Checkups may include:  Periodic blood tests.  Bone density testing. This checks how strong or weak your bones are.  Blood pressure checks. If your blood pressure rises, you may need to take a drug to control it while you are taking corticosteroids.  Blood sugar checks. This is to be sure you are not developing diabetes. If you have diabetes, corticosteroid medications may make it worse and require increased treatment.  Exercise. First, talk with your caregiver about what would be OK for you to do. Aerobic exercise (which increases your heart rate) is usually suggested. It does not have to require a lot of energy. Walking is aerobic exercise. This type of exercise is good because it helps prevent bone loss. It also helps control your blood pressure.  Follow a healthy diet. The goal is to prevent bone damage and diabetes. Include good sources of protein in your diet. Also, include fruits, vegetables and whole grains. Your caregiver can refer you to an expert on healthy eating (dietitian) for more advice. SEEK MEDICAL CARE IF:  The symptoms that lead to your diagnosis return.  You develop worsening fever, fatigue, headache, weight loss, or pain in your jaw.  You develop signs of infection. Infections can be worse if you are on corticosteroid medication. SEEK IMMEDIATE MEDICAL CARE IF:   Your eyesight changes.  Pain does not go away, even after taking pain medicine.  You feel pain in your chest.  Breathing is difficult.  One side of your face or body suddenly becomes weak or numb.  You develop a fever of more than 102 F (38.9 C). Document Released: 04/07/2009 Document Revised: 09/02/2011 Document Reviewed: 08/04/2013 [facility] Patient Information 2015 [place], [place]. This information is not intended to replace advice given to you by your health care provider. Make sure you discuss any questions  you have with your health care provider.

## 2015-02-21 ENCOUNTER — Other Ambulatory Visit: Payer: Self-pay | Admitting: Vascular Surgery

## 2015-02-22 ENCOUNTER — Encounter
Admission: RE | Admit: 2015-02-22 | Discharge: 2015-02-22 | Disposition: A | Discharge status: Home / Self Care | Payer: Medicare Other | Source: Ambulatory Visit | Attending: Vascular Surgery | Admitting: Vascular Surgery

## 2015-02-22 DIAGNOSIS — Z885 Allergy status to narcotic agent status: Secondary | ICD-10-CM | POA: Diagnosis not present

## 2015-02-22 DIAGNOSIS — L409 Psoriasis, unspecified: Secondary | ICD-10-CM | POA: Diagnosis not present

## 2015-02-22 DIAGNOSIS — H538 Other visual disturbances: Secondary | ICD-10-CM | POA: Diagnosis not present

## 2015-02-22 DIAGNOSIS — Z7982 Long term (current) use of aspirin: Secondary | ICD-10-CM | POA: Diagnosis not present

## 2015-02-22 DIAGNOSIS — H2513 Age-related nuclear cataract, bilateral: Secondary | ICD-10-CM | POA: Diagnosis not present

## 2015-02-22 DIAGNOSIS — Z888 Allergy status to other drugs, medicaments and biological substances status: Secondary | ICD-10-CM | POA: Diagnosis not present

## 2015-02-22 DIAGNOSIS — I10 Essential (primary) hypertension: Secondary | ICD-10-CM | POA: Diagnosis not present

## 2015-02-22 DIAGNOSIS — R51 Headache: Secondary | ICD-10-CM | POA: Diagnosis not present

## 2015-02-22 DIAGNOSIS — N3943 Post-void dribbling: Secondary | ICD-10-CM | POA: Diagnosis not present

## 2015-02-22 DIAGNOSIS — K3184 Gastroparesis: Secondary | ICD-10-CM | POA: Diagnosis not present

## 2015-02-22 DIAGNOSIS — Z882 Allergy status to sulfonamides status: Secondary | ICD-10-CM | POA: Diagnosis not present

## 2015-02-22 DIAGNOSIS — K219 Gastro-esophageal reflux disease without esophagitis: Secondary | ICD-10-CM | POA: Diagnosis not present

## 2015-02-22 DIAGNOSIS — E1143 Type 2 diabetes mellitus with diabetic autonomic (poly)neuropathy: Secondary | ICD-10-CM | POA: Diagnosis not present

## 2015-02-22 DIAGNOSIS — G4733 Obstructive sleep apnea (adult) (pediatric): Secondary | ICD-10-CM | POA: Diagnosis not present

## 2015-02-22 DIAGNOSIS — M353 Polymyalgia rheumatica: Secondary | ICD-10-CM | POA: Diagnosis present

## 2015-02-22 DIAGNOSIS — M1990 Unspecified osteoarthritis, unspecified site: Secondary | ICD-10-CM | POA: Diagnosis not present

## 2015-02-22 DIAGNOSIS — J45909 Unspecified asthma, uncomplicated: Secondary | ICD-10-CM | POA: Diagnosis not present

## 2015-02-22 DIAGNOSIS — K7581 Nonalcoholic steatohepatitis (NASH): Secondary | ICD-10-CM | POA: Diagnosis not present

## 2015-02-22 LAB — PROTIME-INR
INR: 0.94
PROTHROMBIN TIME: 12.8 s (ref 11.4–15.0)

## 2015-02-22 LAB — APTT: APTT: 25 s (ref 24–36)

## 2015-02-22 NOTE — Patient Instructions (Signed)
  Your procedure is scheduled on: February 23, 2015 arrive at 1045 am. Report to Same Day Surgery.   Remember: Instructions that are not followed completely may result in serious medical risk, up to and including death, or upon the discretion of your surgeon and anesthesiologist your surgery may need to be rescheduled.    _x___ 1. Do not eat food or drink liquids after midnight. No gum chewing or hard candies.     ____ 2. No Alcohol for 24 hours before or after surgery.   ____ 3. Bring all medications with you on the day of surgery if instructed.    __x__ 4. Notify your doctor if there is any change in your medical condition     (cold, fever, infections).     Do not wear jewelry, make-up, hairpins, clips or nail polish.  Do not wear lotions, powders, or perfumes. You may wear deodorant.  Do not shave 48 hours prior to surgery. Men may shave face and neck.  Do not bring valuables to the hospital.    [facility] is not responsible for any belongings or valuables.               Contacts, dentures or bridgework may not be worn into surgery.  Leave your suitcase in the car. After surgery it may be brought to your room.  For patients admitted to the hospital, discharge time is determined by your treatment team.   Patients discharged the day of surgery will not be allowed to drive home.    Please read over the following fact sheets that you were given:   [facility] Preparing for Surgery  _x___ Take these medicines the morning of surgery with A SIP OF WATER:    1. losartan (COZAAR)   2. predniSONE (DELTASONE)  ____ Fleet Enema (as directed)   ____ Use CHG Soap as directed  _x___ Use inhalers on the day of surgery, bring emergency inhaler.  _x___ Stop metformin now.    ____ Take 1/2 of usual insulin dose the night before surgery and none on the morning of surgery.   _x___ Stop aspirin now.  ____ Stop Anti-inflammatories on does not apply.  ____ Stop supplements until after  surgery.    __x__ Bring C-Pap to the hospital.

## 2015-02-23 ENCOUNTER — Ambulatory Visit
Admission: RE | Admit: 2015-02-23 | Discharge: 2015-02-23 | Disposition: A | Discharge status: Home / Self Care | Payer: Medicare Other | Source: Ambulatory Visit | Attending: Vascular Surgery | Admitting: Vascular Surgery

## 2015-02-23 ENCOUNTER — Encounter
Admission: RE | Disposition: A | Discharge status: Home / Self Care | Payer: Self-pay | Source: Ambulatory Visit | Attending: Vascular Surgery

## 2015-02-23 ENCOUNTER — Ambulatory Visit: Payer: Medicare Other | Admitting: Certified Registered"

## 2015-02-23 ENCOUNTER — Encounter: Payer: Self-pay | Admitting: *Deleted

## 2015-02-23 DIAGNOSIS — Z882 Allergy status to sulfonamides status: Secondary | ICD-10-CM | POA: Insufficient documentation

## 2015-02-23 DIAGNOSIS — H538 Other visual disturbances: Secondary | ICD-10-CM | POA: Insufficient documentation

## 2015-02-23 DIAGNOSIS — Z888 Allergy status to other drugs, medicaments and biological substances status: Secondary | ICD-10-CM | POA: Insufficient documentation

## 2015-02-23 DIAGNOSIS — Z885 Allergy status to narcotic agent status: Secondary | ICD-10-CM | POA: Insufficient documentation

## 2015-02-23 DIAGNOSIS — K3184 Gastroparesis: Secondary | ICD-10-CM | POA: Insufficient documentation

## 2015-02-23 DIAGNOSIS — M353 Polymyalgia rheumatica: Secondary | ICD-10-CM | POA: Insufficient documentation

## 2015-02-23 DIAGNOSIS — M1990 Unspecified osteoarthritis, unspecified site: Secondary | ICD-10-CM | POA: Insufficient documentation

## 2015-02-23 DIAGNOSIS — E1143 Type 2 diabetes mellitus with diabetic autonomic (poly)neuropathy: Secondary | ICD-10-CM | POA: Insufficient documentation

## 2015-02-23 DIAGNOSIS — R51 Headache: Secondary | ICD-10-CM | POA: Insufficient documentation

## 2015-02-23 DIAGNOSIS — Z7982 Long term (current) use of aspirin: Secondary | ICD-10-CM | POA: Insufficient documentation

## 2015-02-23 DIAGNOSIS — K219 Gastro-esophageal reflux disease without esophagitis: Secondary | ICD-10-CM | POA: Insufficient documentation

## 2015-02-23 DIAGNOSIS — J45909 Unspecified asthma, uncomplicated: Secondary | ICD-10-CM | POA: Insufficient documentation

## 2015-02-23 DIAGNOSIS — G4733 Obstructive sleep apnea (adult) (pediatric): Secondary | ICD-10-CM | POA: Insufficient documentation

## 2015-02-23 DIAGNOSIS — H2513 Age-related nuclear cataract, bilateral: Secondary | ICD-10-CM | POA: Insufficient documentation

## 2015-02-23 DIAGNOSIS — K7581 Nonalcoholic steatohepatitis (NASH): Secondary | ICD-10-CM | POA: Insufficient documentation

## 2015-02-23 DIAGNOSIS — N3943 Post-void dribbling: Secondary | ICD-10-CM | POA: Insufficient documentation

## 2015-02-23 DIAGNOSIS — I10 Essential (primary) hypertension: Secondary | ICD-10-CM | POA: Insufficient documentation

## 2015-02-23 DIAGNOSIS — L409 Psoriasis, unspecified: Secondary | ICD-10-CM | POA: Insufficient documentation

## 2015-02-23 HISTORY — PX: ARTERY BIOPSY: SHX891

## 2015-02-23 LAB — GLUCOSE, CAPILLARY
GLUCOSE-CAPILLARY: 132 mg/dL — AB (ref 65–99)
GLUCOSE-CAPILLARY: 187 mg/dL — AB (ref 65–99)

## 2015-02-23 LAB — ABO/RH: ABO/RH(D): O POS

## 2015-02-23 SURGERY — BIOPSY TEMPORAL ARTERY
Anesthesia: General | Wound class: Clean

## 2015-02-23 MED ORDER — ONDANSETRON HCL 4 MG/2ML IJ SOLN
INTRAMUSCULAR | Status: DC | PRN
  Administered 2015-02-23: 4 mg via INTRAVENOUS

## 2015-02-23 MED ORDER — FAMOTIDINE 20 MG PO TABS
ORAL_TABLET | ORAL | Status: AC
  Administered 2015-02-23: 20 mg via ORAL
  Filled 2015-02-23: qty 1

## 2015-02-23 MED ORDER — LIDOCAINE-EPINEPHRINE 1 %-1:100000 IJ SOLN
INTRAMUSCULAR | Status: AC
  Filled 2015-02-23: qty 1

## 2015-02-23 MED ORDER — PROPOFOL 10 MG/ML IV BOLUS
INTRAVENOUS | Status: DC | PRN
  Administered 2015-02-23: 160 mg via INTRAVENOUS
  Administered 2015-02-23: 40 mg via INTRAVENOUS

## 2015-02-23 MED ORDER — FENTANYL CITRATE (PF) 100 MCG/2ML IJ SOLN: 25.0000 ug | INTRAMUSCULAR | Status: DC | PRN

## 2015-02-23 MED ORDER — MIDAZOLAM HCL 2 MG/2ML IJ SOLN
INTRAMUSCULAR | Status: DC | PRN
  Administered 2015-02-23: 2 mg via INTRAVENOUS

## 2015-02-23 MED ORDER — KETAMINE HCL 50 MG/ML IJ SOLN
INTRAMUSCULAR | Status: DC | PRN
  Administered 2015-02-23: 25 mg via INTRAVENOUS

## 2015-02-23 MED ORDER — SUCCINYLCHOLINE CHLORIDE 20 MG/ML IJ SOLN
INTRAMUSCULAR | Status: DC | PRN
  Administered 2015-02-23: 100 mg via INTRAVENOUS

## 2015-02-23 MED ORDER — FAMOTIDINE 20 MG PO TABS
20.0000 mg | ORAL_TABLET | Freq: Once | ORAL | Status: AC
  Administered 2015-02-23: 20 mg via ORAL

## 2015-02-23 MED ORDER — ONDANSETRON HCL 4 MG/2ML IJ SOLN: 4.0000 mg | Freq: Once | INTRAMUSCULAR | Status: DC | PRN

## 2015-02-23 MED ORDER — HYDROCODONE-ACETAMINOPHEN 5-325 MG PO TABS: 1.0000 | ORAL_TABLET | Freq: Four times a day (QID) | ORAL | Status: DC | PRN

## 2015-02-23 MED ORDER — EPHEDRINE SULFATE 50 MG/ML IJ SOLN
INTRAMUSCULAR | Status: DC | PRN
  Administered 2015-02-23: 10 mg via INTRAVENOUS

## 2015-02-23 MED ORDER — LABETALOL HCL 5 MG/ML IV SOLN
INTRAVENOUS | Status: AC
  Filled 2015-02-23: qty 4

## 2015-02-23 MED ORDER — PHENYLEPHRINE HCL 10 MG/ML IJ SOLN
INTRAMUSCULAR | Status: DC | PRN
  Administered 2015-02-23 (×3): 100 ug via INTRAVENOUS

## 2015-02-23 MED ORDER — LIDOCAINE HCL (CARDIAC) 20 MG/ML IV SOLN
INTRAVENOUS | Status: DC | PRN
  Administered 2015-02-23: 50 mg via INTRAVENOUS

## 2015-02-23 MED ORDER — CEFAZOLIN SODIUM-DEXTROSE 2-3 GM-% IV SOLR
2.0000 g | INTRAVENOUS | Status: AC
  Administered 2015-02-23: 2 g via INTRAVENOUS

## 2015-02-23 MED ORDER — SODIUM CHLORIDE 0.9 % IV SOLN
INTRAVENOUS | Status: DC
  Administered 2015-02-23: 12:00:00 via INTRAVENOUS

## 2015-02-23 MED ORDER — DEXAMETHASONE SODIUM PHOSPHATE 4 MG/ML IJ SOLN
INTRAMUSCULAR | Status: DC | PRN
  Administered 2015-02-23: 5 mg via INTRAVENOUS

## 2015-02-23 MED ORDER — CEFAZOLIN SODIUM-DEXTROSE 2-3 GM-% IV SOLR
INTRAVENOUS | Status: DC
Start: 2015-02-23 — End: 2015-02-23
  Filled 2015-02-23: qty 50

## 2015-02-23 MED ORDER — LABETALOL HCL 5 MG/ML IV SOLN
5.0000 mg | INTRAVENOUS | Status: DC | PRN
  Administered 2015-02-23: 5 mg via INTRAVENOUS

## 2015-02-23 MED ORDER — FENTANYL CITRATE (PF) 100 MCG/2ML IJ SOLN
INTRAMUSCULAR | Status: DC | PRN
  Administered 2015-02-23 (×2): 50 ug via INTRAVENOUS

## 2015-02-23 SURGICAL SUPPLY — 33 items
BLADE SURG 15 STRL LF DISP TIS (BLADE) ×1 IMPLANT
BLADE SURG 15 STRL SS (BLADE) ×2
BLADE SURG SZ11 CARB STEEL (BLADE) ×3 IMPLANT
CNTNR SPEC 2.5X3XGRAD LEK (MISCELLANEOUS)
CONT SPEC 4OZ STER OR WHT (MISCELLANEOUS)
CONTAINER SPEC 2.5X3XGRAD LEK (MISCELLANEOUS) IMPLANT
COTTON BALL STRL MEDIUM (GAUZE/BANDAGES/DRESSINGS) ×3 IMPLANT
DRAPE PED LAPAROTOMY (DRAPES) ×3 IMPLANT
DRESSING TELFA 4X3 1S ST N-ADH (GAUZE/BANDAGES/DRESSINGS) ×3 IMPLANT
ELECT CAUTERY BLADE 6.4 (BLADE) ×3 IMPLANT
GLOVE BIO SURGEON STRL SZ7 (GLOVE) ×15 IMPLANT
GOWN L4 XLG 20 PK N/S (GOWN DISPOSABLE) ×3 IMPLANT
GOWN STRL REUS W/ TWL LRG LVL3 (GOWN DISPOSABLE) ×2 IMPLANT
GOWN STRL REUS W/TWL LRG LVL3 (GOWN DISPOSABLE) ×4
KIT RM TURNOVER STRD PROC AR (KITS) ×3 IMPLANT
LABEL OR SOLS (LABEL) IMPLANT
LIQUID BAND (GAUZE/BANDAGES/DRESSINGS) ×3 IMPLANT
NEEDLE HYPO 25X1 1.5 SAFETY (NEEDLE) ×3 IMPLANT
NS IRRIG 500ML POUR BTL (IV SOLUTION) ×3 IMPLANT
PACK BASIN MINOR ARMC (MISCELLANEOUS) ×3 IMPLANT
PAD GROUND ADULT SPLIT (MISCELLANEOUS) ×3 IMPLANT
SOL PREP PVP 2OZ (MISCELLANEOUS) ×3
SOLUTION PREP PVP 2OZ (MISCELLANEOUS) ×1 IMPLANT
SUCTION FRAZIER TIP 10 FR DISP (SUCTIONS) ×3 IMPLANT
SUT MNCRL AB 4-0 PS2 18 (SUTURE) ×3 IMPLANT
SUT SILK 2 0 (SUTURE) ×2
SUT SILK 2-0 30XBRD TIE 12 (SUTURE) ×1 IMPLANT
SUT SILK 4 0 (SUTURE) ×2
SUT SILK 4-0 18XBRD TIE 12 (SUTURE) ×1 IMPLANT
SUT VIC AB 3-0 SH 27 (SUTURE) ×2
SUT VIC AB 3-0 SH 27X BRD (SUTURE) ×1 IMPLANT
SYR BULB IRRIG 60ML STRL (SYRINGE) ×3 IMPLANT
SYRINGE 10CC LL (SYRINGE) ×3 IMPLANT

## 2015-02-23 NOTE — Transfer of Care (Signed)
Immediate Anesthesia Transfer of Care Note  Patient: Mary Cantrell  Procedure(s) Performed: Procedure(s): BIOPSY TEMPORAL ARTERY (N/A)  Patient Location: PACU  Anesthesia Type:General  Level of Consciousness: awake  Airway & Oxygen Therapy: Patient Spontanous Breathing and Patient connected to face mask oxygen  Post-op Assessment: Report given to RN  Post vital signs: Reviewed  Last Vitals:  Filed Vitals:   02/23/15 1323  BP: 172/105  Pulse: 83  Temp: 37 C  Resp: 15    Complications: No apparent anesthesia complications

## 2015-02-23 NOTE — Discharge Instructions (Signed)

## 2015-02-23 NOTE — Anesthesia Preprocedure Evaluation (Addendum)
Anesthesia Evaluation  Patient identified by MRN, date of birth, ID band Patient awake    Reviewed: Allergy & Precautions, H&P , NPO status , Patient's Chart, lab work & pertinent test results, reviewed documented beta blocker date and time   History of Anesthesia Complications Negative for: history of anesthetic complications  Airway Mallampati: II  TM Distance: >3 FB Neck ROM: full    Dental no notable dental hx. (+) Teeth Intact   Pulmonary shortness of breath and with exertion, asthma , sleep apnea and Continuous Positive Airway Pressure Ventilation , neg recent URI,  breath sounds clear to auscultation  Pulmonary exam normal       Cardiovascular Exercise Tolerance: Good hypertension, - angina- CAD, - Past MI, - Cardiac Stents and - CABG Normal cardiovascular exam- dysrhythmias - Valvular Problems/MurmursRhythm:regular Rate:Normal     Neuro/Psych PSYCHIATRIC DISORDERS (depression) negative neurological ROS     GI/Hepatic GERD-  ,(+) Hepatitis - (NASH)  Endo/Other  diabetes, Type 2  Renal/GU negative Renal ROS  negative genitourinary   Musculoskeletal   Abdominal   Peds  Hematology negative hematology ROS (+)   Anesthesia Other Findings Past Medical History:   Trochanteric bursitis of right hip                           Irritable bowel syndrome                                     Depression                                                   TMJ (dislocation of temporomandibular joint)                 Regurgitation                                                Tear of left rotator cuff                                    SOB (shortness of breath)                                    Post-void dribbling                                          Maculopathy                                                  OA (osteoarthritis)  Comment:Hips   Nuclear sclerosis of both eyes                                Asthma                                                         Comment:Moderate   Achilles tendinitis of right lower extremity                 Hypertension                                                 Hypoxemia                                                    OSA (obstructive sleep apnea)                                Chronic back pain                                            Allergy                                                      Hyperlipidemia                                               NASH (nonalcoholic steatohepatitis)                          Psoriasis                                                    Gastroparesis                                                  Comment:DM   Reproductive/Obstetrics negative OB ROS                            Anesthesia Physical Anesthesia Plan  ASA: III  Anesthesia Plan: General   Post-op Pain Management:    Induction:   Airway Management Planned:   Additional Equipment:   Intra-op  Plan:   Post-operative Plan:   Informed Consent: I have reviewed the patients History and Physical, chart, labs and discussed the procedure including the risks, benefits and alternatives for the proposed anesthesia with the patient or authorized representative who has indicated his/her understanding and acceptance.   Dental Advisory Given  Plan Discussed with: Anesthesiologist, CRNA and Surgeon  Anesthesia Plan Comments:        Anesthesia Quick Evaluation

## 2015-02-23 NOTE — H&P (Signed)
[facility] History & Physical Update  The patient was interviewed and re-examined.  The patient's previous History and Physical has been reviewed and is unchanged.  There is no change in the plan of care. We plan to proceed with the scheduled procedure.  DEW,JASON, MD  02/23/2015, 12:01 PM

## 2015-02-23 NOTE — Op Note (Signed)
        OPERATIVE NOTE   PRE-OPERATIVE DIAGNOSIS: giant cell arteritis with polymyalgia rheumatica POST-OPERATIVE DIAGNOSIS: Same as above  PROCEDURE: 1.   left temporal artery biopsy  SURGEON: Bresha Hosack, MD  ASSISTANT(S): Hezzie Bump, PA-C  ANESTHESIA: general  ESTIMATED BLOOD LOSS: 30 cc  FINDING(S): 1.  Small artery that seemed somewhat thickened  SPECIMEN(S):  left superficial temporal artery sent to pathology  INDICATIONS:   Patient is a 69 y.o. female who presents with headaches and visual changes worrisome for giant cell arteritis.  She also has weakness and pain worrisome for polymyalgia rheumatica.  We were consulted by her primary care physician for consideration for temporal artery biopsy. Risks and benefits were discussed and he was agreeable to proceed.  DESCRIPTION: After obtaining full informed written consent, the patient was brought back to the operating room and placed supine upon the operating table.  The patient received IV antibiotics prior to induction.  After obtaining adequate anesthesia, the patient was prepped and draped in the standard fashion. I then made an incision just in front of the left ear overlying the palpable pulse. I then dissected down through the subcutaneous tissues and identified the superficial temporal artery. This was dissected out over a several centimeters and branches were ligated and divided between silk ties. Care was used to avoid electrocautery around the artery. There was a small vein nearby which created a little nuisance bleeding and this was ligated and divided. I then clamped the artery proximally and distally and transected the artery. The specimen was then sent to pathology. The proximal and distal artery were ligated with 3-0 silk ties. Hemostasis was achieved. The wound was then closed with a series of interrupted 3-0 Vicryl's and the skin was closed with a 4-0 Monocryl. Sterile dressing was placed. The patient was taken to the  recovery room in stable condition having tolerated the procedure well.  COMPLICATIONS: None  CONDITION: Stable   Haleigh Desmith 02/23/2015 1:12 PM

## 2015-02-23 NOTE — Anesthesia Procedure Notes (Signed)
Procedure Name: Intubation Performed by: Rolla Plate Pre-anesthesia Checklist: Patient identified, Patient being monitored, Timeout performed, Emergency Drugs available and Suction available Patient Re-evaluated:Patient Re-evaluated prior to inductionOxygen Delivery Method: Circle system utilized Preoxygenation: Pre-oxygenation with 100% oxygen Intubation Type: IV induction Ventilation: Mask ventilation without difficulty Laryngoscope Size: Miller and 2 Grade View: Grade II Tube type: Oral Tube size: 7.0 mm Number of attempts: 1 Airway Equipment and Method: Stylet Placement Confirmation: ETT inserted through vocal cords under direct vision,  positive ETCO2 and breath sounds checked- equal and bilateral Secured at: 21 cm Tube secured with: Tape Dental Injury: Teeth and Oropharynx as per pre-operative assessment  Comments: Tooth guard

## 2015-02-24 ENCOUNTER — Encounter: Payer: Medicare Other | Attending: Specialist

## 2015-02-24 ENCOUNTER — Telehealth: Payer: Self-pay | Admitting: Respiratory Therapy

## 2015-02-24 DIAGNOSIS — G4730 Sleep apnea, unspecified: Secondary | ICD-10-CM | POA: Insufficient documentation

## 2015-02-24 DIAGNOSIS — J4520 Mild intermittent asthma, uncomplicated: Secondary | ICD-10-CM | POA: Insufficient documentation

## 2015-02-24 LAB — TYPE AND SCREEN
ABO/RH(D): O POS
ANTIBODY SCREEN: POSITIVE
Unit division: 0
Unit division: 0

## 2015-02-24 LAB — SURGICAL PATHOLOGY

## 2015-02-24 NOTE — Progress Notes (Signed)
Patient notified

## 2015-02-25 ENCOUNTER — Other Ambulatory Visit: Payer: Self-pay | Admitting: Family Medicine

## 2015-02-25 NOTE — Anesthesia Postprocedure Evaluation (Signed)
  Anesthesia Post-op Note  Patient: Mary Cantrell  Procedure(s) Performed: Procedure(s): BIOPSY TEMPORAL ARTERY (N/A)  Anesthesia type:General  Patient location: PACU  Post pain: Pain level controlled  Post assessment: Post-op Vital signs reviewed, Patient's Cardiovascular Status Stable, Respiratory Function Stable, Patent Airway and No signs of Nausea or vomiting  Post vital signs: Reviewed and stable  Last Vitals:  Filed Vitals:   02/23/15 1511  BP: 140/84  Pulse: 72  Temp:   Resp:     Level of consciousness: awake, alert  and patient cooperative  Complications: No apparent anesthesia complications

## 2015-02-28 ENCOUNTER — Telehealth: Payer: Self-pay | Admitting: Family Medicine

## 2015-02-28 NOTE — Telephone Encounter (Signed)
Patient was referred to see a rheumatologist, however, they called her and is not able to get her in for another month. Is it possible to find her another referral doctor

## 2015-03-01 ENCOUNTER — Encounter: Payer: Medicare Other | Admitting: *Deleted

## 2015-03-01 DIAGNOSIS — G4730 Sleep apnea, unspecified: Secondary | ICD-10-CM | POA: Diagnosis present

## 2015-03-01 DIAGNOSIS — J4520 Mild intermittent asthma, uncomplicated: Secondary | ICD-10-CM

## 2015-03-01 NOTE — Progress Notes (Signed)
Daily Session Note  Patient Details  Name: Mary Cantrell MRN: [id number] Date of Birth: 1946-06-24 Referring Provider:  Erby Pian, MD  Encounter Date: 03/01/2015  Check In:     Session Check In - 03/01/15 1157    Check-In   Staff Present Laureen Owens Shark BS, RRT, Respiratory Therapist;Mera Gunkel Dillard Essex MS, ACSM CEP Exercise Physiologist;Steven Way BS, ACSM EP-C, Exercise Physiologist   ER physicians immediately available to respond to emergencies [facility] immediately available ER MD   Physician(s) Cinda Quest and Marcelene Butte   Medication changes reported     No   Fall or balance concerns reported    No   Warm-up and Cool-down Performed on first and last piece of equipment   VAD Patient? No   Pain Assessment   Currently in Pain? No/denies   Multiple Pain Sites No         Goals Met:  Proper associated with RPD/PD & O2 Sat Independence with exercise equipment Using PLB without cueing & demonstrates good technique Exercise tolerated well Strength training completed today  Goals Unmet:  Chronic pain still present  Goals Comments: Was out last week due to an artery biopsy taken for diagnostic purposes. The biopsy came back clear and the results were good. [name] is going to slowly resume previous workloads.    Dr. Emily Filbert is Medical Director for [facility].

## 2015-03-03 ENCOUNTER — Other Ambulatory Visit: Payer: Self-pay | Admitting: Family Medicine

## 2015-03-03 ENCOUNTER — Encounter: Payer: Medicare Other | Admitting: *Deleted

## 2015-03-03 DIAGNOSIS — J4520 Mild intermittent asthma, uncomplicated: Secondary | ICD-10-CM | POA: Diagnosis not present

## 2015-03-03 NOTE — Progress Notes (Signed)
Daily Session Note  Patient Details  Name: Mary Cantrell MRN: [id number] Date of Birth: 12/07/1945 Referring Provider:  Erby Pian, MD  Encounter Date: 03/03/2015  Check In:     Session Check In - 03/03/15 1240    Check-In   Staff Present Frederich Cha RRT, RCP Respiratory Therapist;Laureen Janell Quiet, RRT, Respiratory Therapist;Deangleo Passage Dillard Essex MS, ACSM CEP Exercise Physiologist;Susanne Bice RN, BSN, [place]   ER physicians immediately available to respond to emergencies [facility] immediately available ER MD   Medication changes reported     No   Fall or balance concerns reported    No   Warm-up and Cool-down Performed on first and last piece of equipment   VAD Patient? No   Pain Assessment   Currently in Pain? No/denies   Multiple Pain Sites No           Exercise Prescription Changes - 03/03/15 1200    Exercise Review   Progression Yes   Response to Exercise   Blood Pressure (Admit) 128/70 mmHg   Blood Pressure (Exercise) 150/90 mmHg   Blood Pressure (Exit) 122/62 mmHg   Heart Rate (Admit) 89 bpm   Heart Rate (Exercise) 84 bpm   Heart Rate (Exit) 77 bpm   Oxygen Saturation (Admit) 94 %   Oxygen Saturation (Exercise) 97 %   Oxygen Saturation (Exit) 96 %   Rating of Perceived Exertion (Exercise) 15   Perceived Dyspnea (Exercise) 3   Duration Progress to 50 minutes of aerobic without signs/symptoms of physical distress   Intensity THRR unchanged   Progression Continue progressive overload as per policy without signs/symptoms or physical distress.   Resistance Training   Training Prescription Yes   Weight 1   Reps 10-12   Treadmill   MPH 2.2  Doing 10 minute intervals x 4   Grade 0   Minutes 40      Goals Met:  Proper associated with RPD/PD & O2 Sat Independence with exercise equipment Using PLB without cueing & demonstrates good technique Exercise tolerated well Personal goals reviewed Strength training completed today  Goals Unmet:  Not  Applicable  Goals Comments: [name] is going to focus solely on the treadmill for exercise as the other machines cause her pain. She is doing 10 minute intervals and trying to get as many in during the class as possible.    Dr. Emily Filbert is Medical Director for [facility].

## 2015-03-03 NOTE — Telephone Encounter (Signed)
Pt takes a total of 15 mg a day, she states she take .5 at breakfast, .5 at lunch, and .5 at dinner and on has 9 pills left and does not see rheumatologist again until oct.

## 2015-03-03 NOTE — Telephone Encounter (Signed)
Patient requesting refill. 

## 2015-03-06 ENCOUNTER — Ambulatory Visit: Payer: Self-pay | Admitting: Family Medicine

## 2015-03-06 ENCOUNTER — Encounter: Payer: Medicare Other | Admitting: Respiratory Therapy

## 2015-03-06 DIAGNOSIS — J4520 Mild intermittent asthma, uncomplicated: Secondary | ICD-10-CM | POA: Diagnosis not present

## 2015-03-06 DIAGNOSIS — G4733 Obstructive sleep apnea (adult) (pediatric): Secondary | ICD-10-CM

## 2015-03-06 NOTE — Progress Notes (Signed)
Pulmonary Individual Treatment Plan  Patient Details  Name: Mary Cantrell MRN: [id number] Date of Birth: Nov 16, 1945 Referring Provider:  Erby Pian, MD  Initial Encounter Date: 12/06/2014  Visit Diagnosis: Asthma in adult, mild intermittent, uncomplicated  OSA (obstructive sleep apnea)  Patient's Home Medications on Admission:  Current outpatient prescriptions:    albuterol (VENTOLIN HFA) 108 (90 BASE) MCG/ACT inhaler, Inhale 2 puffs into the lungs every 4 (four) hours as needed for wheezing (takes every am and then as needed.). , Disp: , Rfl:    aspirin 81 MG tablet, Take 81 mg by mouth every morning. , Disp: , Rfl:    budesonide-formoterol (SYMBICORT) 160-4.5 MCG/ACT inhaler, Inhale 2 puffs into the lungs every morning. , Disp: , Rfl:    Cinnamon 500 MG capsule, Take 500 mg by mouth every morning. , Disp: , Rfl:    cyanocobalamin 100 MCG tablet, Take 100 mcg by mouth every morning. , Disp: , Rfl:    diphenhydrAMINE (BENADRYL) 25 MG tablet, Take 25 mg by mouth at bedtime as needed (nose gets clogged up with CPAP machine). , Disp: , Rfl:    fluticasone (FLONASE) 50 MCG/ACT nasal spray, Place 2 sprays into both nostrils every morning. , Disp: , Rfl:    gabapentin (NEURONTIN) 100 MG capsule, Take 1 capsule (100 mg total) by mouth 3 (three) times daily. (Patient taking differently: Take 100 mg by mouth at bedtime. 2 capsules at bedtime), Disp: 180 capsule, Rfl: 4   glipiZIDE (GLIPIZIDE XL) 5 MG 24 hr tablet, Take 1 tablet (5 mg total) by mouth daily with breakfast., Disp: 90 tablet, Rfl: 0   glucose blood (ONE TOUCH ULTRA TEST) test strip, 1 each by Other route 2 (two) times daily. Use as instructed, Disp: 100 each, Rfl: 2   HYDROcodone-acetaminophen (NORCO) 5-325 MG per tablet, Take 1 tablet by mouth every 6 (six) hours as needed for moderate pain., Disp: 30 tablet, Rfl: 0   loratadine (CLARITIN) 10 MG tablet, Take 1 tablet (10 mg total) by mouth daily. (Patient taking  differently: Take 10 mg by mouth daily as needed. ), Disp: 60 tablet, Rfl: 0   losartan (COZAAR) 50 MG tablet, TAKE 1 TABLET DAILY FOR BLOOD PRESSURE, Disp: 90 tablet, Rfl: 1   meloxicam (MOBIC) 7.5 MG tablet, Take by mouth. On hold while taking prednisone, Disp: , Rfl:    metFORMIN (GLUCOPHAGE-XR) 500 MG 24 hr tablet, One in am and 2 in pm (Patient taking differently: 500 mg. One in am and 2 in pm), Disp: 90 tablet, Rfl: 2   pravastatin (PRAVACHOL) 40 MG tablet, Take 40 mg by mouth at bedtime. , Disp: , Rfl:    predniSONE (DELTASONE) 5 MG tablet, Take 1 tablet (5 mg total) by mouth 3 (three) times daily., Disp: 45 tablet, Rfl: 0   ranitidine (ZANTAC) 150 MG tablet, Take 1 tablet (150 mg total) by mouth 2 (two) times daily., Disp: 60 tablet, Rfl: 0   spironolactone-hydrochlorothiazide (ALDACTAZIDE) 25-25 MG per tablet, TAKE ONE-HALF (1/2) TO ONE TABLET DAILY FOR BLOOD PRESSURE (Patient taking differently: TAKE ONE-HALF (1/2) TO ONE TABLET DAILY FOR BLOOD PRESSURE in am), Disp: 90 tablet, Rfl: 1   vitamin C (ASCORBIC ACID) 500 MG tablet, Take 1 tablet by mouth at bedtime. , Disp: , Rfl:   Past Medical History: Past Medical History  Diagnosis Date   Trochanteric bursitis of right hip    Irritable bowel syndrome    Depression    TMJ (dislocation of temporomandibular joint)  Regurgitation    Tear of left rotator cuff    SOB (shortness of breath)    Post-void dribbling    Maculopathy    OA (osteoarthritis)     Hips   Nuclear sclerosis of both eyes    Asthma     Moderate   Achilles tendinitis of right lower extremity    Hypertension    Hypoxemia    OSA (obstructive sleep apnea)    Chronic back pain    Allergy    Hyperlipidemia    NASH (nonalcoholic steatohepatitis)    Psoriasis    Gastroparesis     DM    Tobacco Use: History  Smoking status   Never Smoker   Smokeless tobacco   Never Used    Labs: Recent Review Flowsheet Data    Labs for  ITP Cardiac and Pulmonary Rehab Latest Ref Rng 04/22/2014 08/30/2014   Cholestrol 0 - 200 mg/dL 159 -   LDLCALC - 77 -   HDL 35 - 70 mg/dL 48 -   Trlycerides 40 - 160 mg/dL 170(A) -   Hemoglobin A1c 4.0 - 6.0 % - 7.3(A)         POCT Glucose      12/12/14 1130 12/14/14 1130 12/16/14 1442       POCT Blood Glucose   Pre-Exercise 193 mg/dL       Post-Exercise 117 mg/dL       Pre-Exercise #2  218 mg/dL      Post-Exercise #2  159 mg/dL      Pre-Exercise #3   203 mg/dL     Post-Exercise #3   113 mg/dL        ADL UCSD:     ADL UCSD      12/06/14 1130       ADL UCSD   ADL Phase Entry     SOB Score total 73     Rest 2     Walk 2     Stairs 4     Bath 3     Dress 2     Shop 4         Pulmonary Function Assessment:     Pulmonary Function Assessment - 12/06/14 1130    Initial Spirometry Results   FVC% 84 %   FEV1% 88 %   FEV1/FVC Ratio 83.25      Exercise Target Goals:    Exercise Program Goal: Individual exercise prescription set with THRR, safety & activity barriers. Participant demonstrates ability to understand and report RPE using BORG scale, to self-measure pulse accurately, and to acknowledge the importance of the exercise prescription.  Exercise Prescription Goal: Starting with aerobic activity 30 plus minutes a day, 3 days per week for initial exercise prescription. Provide home exercise prescription and guidelines that participant acknowledges understanding prior to discharge.  Activity Barriers & Risk Stratification:     Activity Barriers & Risk Stratification - 12/06/14 1130    Activity Barriers & Risk Stratification   Activity Barriers Shortness of Breath;Deconditioning;Balance Concerns   Risk Stratification Moderate      6 Minute Walk:     6 Minute Walk      12/06/14 1240       6 Minute Walk   Phase Initial     Distance 1320 feet     Walk Time 6 minutes     Resting HR 78 bpm     Resting BP 118/74 mmHg     Max Ex. HR 84 bpm  Max  Ex. BP 132/84 mmHg     RPE 13     Perceived Dyspnea  3     Symptoms No        Initial Exercise Prescription:     Initial Exercise Prescription - 12/06/14 1700    Date of Initial Exercise Prescription   Date 12/06/14   Treadmill   MPH 1.5   Grade 0   Minutes 10   Recumbant Bike   Level 1   RPM 40   Watts 20   Minutes 10   NuStep   Level 4   Watts 40   Minutes 10   Arm Ergometer   Level 1   Watts 10   Minutes 10   Arm/Foot Ergometer   Level 1   Watts 12   Minutes 10   Recumbant Elliptical   Level 1   RPM 40   Watts 40   Minutes 10   Elliptical   Level 1   Speed 3   Minutes 1   REL-XR   Level 1   Watts 40   Minutes 10   Prescription Details   Frequency (times per week) 3   Duration Progress to 30 minutes of continuous aerobic without signs/symptoms of physical distress   Intensity   THRR REST +  30   Ratings of Perceived Exertion 11-15   Perceived Dyspnea 2-4   Progression Continue progressive overload as per policy without signs/symptoms or physical distress.   Resistance Training   Training Prescription Yes   Weight 1   Reps 10-12      Exercise Prescription Changes:     Exercise Prescription Changes      12/12/14 1400 12/14/14 1400 12/19/14 1200 01/03/15 0800 01/04/15 1400   Exercise Review   Progression   Yes Yes Yes   Response to Exercise   Blood Pressure (Admit) 140/84 mmHg 140/84 mmHg 140/84 mmHg 130/82 mmHg  Entry for 01/02/2015 120/64 mmHg   Blood Pressure (Exercise) 148/80 mmHg 148/80 mmHg 148/80 mmHg 140/80 mmHg 140/70 mmHg   Blood Pressure (Exit) 130/76 mmHg 130/76 mmHg 130/76 mmHg 122/80 mmHg 124/80 mmHg   Heart Rate (Admit) 87 bpm 87 bpm 87 bpm 82 bpm 64 bpm   Heart Rate (Exercise) 92 bpm 92 bpm 92 bpm 96 bpm 99 bpm   Heart Rate (Exit) 76 bpm 76 bpm 76 bpm 80 bpm 85 bpm   Oxygen Saturation (Admit) 92 % 92 % 92 % 95 % 92 %   Oxygen Saturation (Exercise) 95 % 95 % 95 % 95 % 94 %   Oxygen Saturation (Exit) 92 % 92 % 92 % 95 % 95 %    Rating of Perceived Exertion (Exercise) 12 12 12 12 12    Perceived Dyspnea (Exercise) 3 3 3 2 4    Resistance Training   Training Prescription Yes Yes Yes Yes Yes   Weight 1 1 1 1 1    Reps 10-12 10-12 10-12 10-12 10-12   Treadmill   MPH 1.5 1.5 1.5 1.7 1.9   Grade 0 0 0 0 0   Minutes 10 10 15 15 15    Recumbant Bike   Level  1 1 1 3    RPM  40 40 40 50   Watts     15   Minutes  15  NuStep bothered Ms Girgis's right heal, new goal RB 15  NuStep bothered Ms Nowack's right heal, new goal RB 15  NuStep bothered Ms Wollam's right heal, new goal RB  NuStep   Level 2 2 2 2     Watts 40 40 40 40    Minutes 15 15 15 15     REL-XR   Level 2 2 2 3 3    Watts 40 40 40 50 50   Minutes 10 10 10 15 10      01/06/15 1200 01/09/15 1500 01/23/15 1300 02/03/15 1100 02/06/15 1500   Exercise Review   Progression Yes Yes Yes Yes Yes   Response to Exercise   Blood Pressure (Admit) 120/64 mmHg 120/64 mmHg 120/64 mmHg 120/64 mmHg 128/70 mmHg   Blood Pressure (Exercise) 140/70 mmHg 140/70 mmHg 140/70 mmHg 140/70 mmHg 150/90 mmHg   Blood Pressure (Exit) 124/80 mmHg 124/80 mmHg 124/80 mmHg 124/80 mmHg 122/62 mmHg   Heart Rate (Admit) 64 bpm 64 bpm 64 bpm 64 bpm 89 bpm   Heart Rate (Exercise) 99 bpm 99 bpm 99 bpm 99 bpm 84 bpm   Heart Rate (Exit) 85 bpm 85 bpm 85 bpm 85 bpm 77 bpm   Oxygen Saturation (Admit) 92 % 92 % 92 % 92 % 94 %   Oxygen Saturation (Exercise) 94 % 94 % 94 % 94 % 97 %   Oxygen Saturation (Exit) 95 % 95 % 95 % 95 % 96 %   Rating of Perceived Exertion (Exercise) 12 12 12 12 15    Perceived Dyspnea (Exercise) 4 4 4 4 3    Resistance Training   Training Prescription Yes Yes Yes Yes Yes   Weight 1 1 1 1 1    Reps 10-12 10-12 10-12 10-12 10-12   Treadmill   MPH 1.9 2.2 2.2 2.2 2.2   Grade 0 0 1 1 0   Minutes 15 15 15 15 15    Recumbant Bike   Level 3    3   RPM 50    50   Watts 15       Minutes     15   NuStep   Level  3 3 5 5    Watts  50 50 60 60   Minutes  15 15 15 15    REL-XR    Level 3 3 3 3 3    Watts 60 60 60 60 60   Minutes 15 15 15 15 15      03/03/15 1200 03/06/15 1500         Exercise Review   Progression Yes Yes      Response to Exercise   Blood Pressure (Admit) 128/70 mmHg 112/60 mmHg      Blood Pressure (Exercise) 150/90 mmHg 138/70 mmHg      Blood Pressure (Exit) 122/62 mmHg 120/70 mmHg      Heart Rate (Admit) 89 bpm 93 bpm      Heart Rate (Exercise) 84 bpm 98 bpm      Heart Rate (Exit) 77 bpm 84 bpm      Oxygen Saturation (Admit) 94 % 94 %      Oxygen Saturation (Exercise) 97 % 65 %      Oxygen Saturation (Exit) 96 % 94 %      Rating of Perceived Exertion (Exercise) 15 13      Perceived Dyspnea (Exercise) 3 4      Duration Progress to 50 minutes of aerobic without signs/symptoms of physical distress Progress to 50 minutes of aerobic without signs/symptoms of physical distress      Intensity THRR unchanged THRR unchanged      Progression Continue progressive overload as per policy without signs/symptoms or physical distress.  Continue progressive overload as per policy without signs/symptoms or physical distress.      Resistance Training   Training Prescription Yes Yes      Weight 1 1      Reps 10-12 10-12      Treadmill   MPH 2.2  Doing 10 minute intervals x 4 1.9  Doing 10 minute intervals x 2      Grade 0 0      Minutes 40 40      REL-XR   Level  3      Watts  28      Minutes  11         Discharge Exercise Prescription (Final Exercise Prescription Changes):     Exercise Prescription Changes - 03/06/15 1500    Exercise Review   Progression Yes   Response to Exercise   Blood Pressure (Admit) 112/60 mmHg   Blood Pressure (Exercise) 138/70 mmHg   Blood Pressure (Exit) 120/70 mmHg   Heart Rate (Admit) 93 bpm   Heart Rate (Exercise) 98 bpm   Heart Rate (Exit) 84 bpm   Oxygen Saturation (Admit) 94 %   Oxygen Saturation (Exercise) 65 %   Oxygen Saturation (Exit) 94 %   Rating of Perceived Exertion (Exercise) 13   Perceived  Dyspnea (Exercise) 4   Duration Progress to 50 minutes of aerobic without signs/symptoms of physical distress   Intensity THRR unchanged   Progression Continue progressive overload as per policy without signs/symptoms or physical distress.   Resistance Training   Training Prescription Yes   Weight 1   Reps 10-12   Treadmill   MPH 1.9  Doing 10 minute intervals x 2   Grade 0   Minutes 40   REL-XR   Level 3   Watts 28   Minutes 11       Nutrition:  Target Goals: Understanding of nutrition guidelines, daily intake of sodium <1573m, cholesterol <2055m calories 30% from fat and 7% or less from saturated fats, daily to have 5 or more servings of fruits and vegetables.  Biometrics:    Nutrition Therapy Plan and Nutrition Goals:     Nutrition Therapy & Goals - 12/06/14 1130    Nutrition Therapy   Diet Ms MuDenardorefers not to meet with the dietitian; she states she known what to do; she eats out mostly, but does eat only half and takes the rest to go home; she would like to loss 20lbs      Nutrition Discharge: Rate Your Plate Scores:   Psychosocial: Target Goals: Acknowledge presence or absence of depression, maximize coping skills, provide positive support system. Participant is able to verbalize types and ability to use techniques and skills needed for reducing stress and depression.  Initial Review & Psychosocial Screening:     Initial Psych Review & Screening - 12/06/14 1130    Family Dynamics   Good Support System? Yes   Comments Her husband and 2 children are very supportive; her sister lives close and is supportive.   Barriers   Psychosocial barriers to participate in program There are no identifiable barriers or psychosocial needs.;The patient should benefit from training in stress management and relaxation.   Screening Interventions   Interventions Encouraged to exercise;Program counselor consult      Quality of Life Scores:     Quality of Life -  12/06/14 1130    Quality of Life Scores   Health/Function Pre 16.59 %   Socioeconomic Pre 23.93 %  Psych/Spiritual Pre 24 %   Family Pre 26.1 %   GLOBAL Pre 20.81 %      PHQ-9:     Recent Review Flowsheet Data    Depression screen [facility] 2/9 02/20/2015 12/21/2014 12/06/2014   Decreased Interest 0 0 0   Down, Depressed, Hopeless 0 0 0   PHQ - 2 Score 0 0 0      Psychosocial Evaluation and Intervention:     Psychosocial Evaluation - 12/12/14 1254    Psychosocial Evaluation & Interventions   Comments Counselor met with Ms. Blumstein for initial psychosocial evaluation.   She is a 69 year old female who has asthma, sleep apnea and type 2 diabetes.  She has a strong support system with a spouse of 70 years.  Ms. Yousuf states she has been sleeping better since she began using a CPAP machine.  Her appetite is "okay" and she reports no current symptoms of depression or anxiety, although there was a history of depression "many years ago."  Ms. Sanjurjo states that her health issues and subsequent limitations are stressful for her making traveling and gardening difficult.  She presents in a positive mood and has goals to increase her stamina, strength, and be able to breathe better.  Counselor encouraged her to consistently exercise to accomplish these goals.          Psychosocial Re-Evaluation:     Psychosocial Re-Evaluation      01/04/15 1037           Psychosocial Re-Evaluation   Comments Counselor met with Ms. Ridinger today for a follow up.  She reports being stronger and having more energy to garden and do those things that she enjoys since beginning this program.  She is working hard on her goals and it appears to be paying off for Ms. Kubitz.           Education: Education Goals: Education classes will be provided on a weekly basis, covering required topics. Participant will state understanding/return demonstration of topics presented.  Learning Barriers/Preferences:     Learning  Barriers/Preferences - 12/06/14 1130    Learning Barriers/Preferences   Learning Barriers None   Learning Preferences Group Instruction;Individual Instruction;Pictoral;Skilled Demonstration;Verbal Instruction;Video;Written Material      Education Topics: Initial Evaluation Education: - Verbal, written and demonstration of respiratory meds, RPE/PD scales, oximetry and breathing techniques. Instruction on use of nebulizers and MDIs: cleaning and proper use, rinsing mouth with steroid doses and importance of monitoring MDI activations.          Pulmonary Rehab from 03/01/2015 in [place]   Date  12/06/14   Educator  LB   Instruction Review Code  2- meets goals/outcomes      General Nutrition Guidelines/Fats and Fiber: -Group instruction provided by verbal, written material, models and posters to present the general guidelines for heart healthy nutrition. Gives an explanation and review of dietary fats and fiber.      Pulmonary Rehab from 03/01/2015 in [place]   Date  02/06/15   Educator  Karolee Stamps, RD   Instruction Review Code  2- meets goals/outcomes      Controlling Sodium/Reading Food Labels: -Group verbal and written material supporting the discussion of sodium use in heart healthy nutrition. Review and explanation with models, verbal and written materials for utilization of the food label.   Exercise Physiology & Risk Factors: - Group verbal and written instruction with models to review the exercise physiology  of the cardiovascular system and associated critical values. Details cardiovascular disease risk factors and the goals associated with each risk factor.      Pulmonary Rehab from 03/01/2015 in [place]   Date  03/01/15   Educator  SW   Instruction Review Code  2- meets goals/outcomes      Aerobic Exercise & Resistance Training: - Gives group verbal  and written discussion on the health impact of inactivity. On the components of aerobic and resistive training programs and the benefits of this training and how to safely progress through these programs.      Pulmonary Rehab from 03/01/2015 in [place]   Date  12/28/14   Educator  SW   Instruction Review Code  2- meets goals/outcomes      Flexibility, Balance, General Exercise Guidelines: - Provides group verbal and written instruction on the benefits of flexibility and balance training programs. Provides general exercise guidelines with specific guidelines to those with heart or lung disease. Demonstration and skill practice provided.      Pulmonary Rehab from 03/01/2015 in [place]   Date  01/25/15   Educator  SW   Instruction Review Code  2- meets goals/outcomes      Stress Management: - Provides group verbal and written instruction about the health risks of elevated stress, cause of high stress, and healthy ways to reduce stress.   Depression: - Provides group verbal and written instruction on the correlation between heart/lung disease and depressed mood, treatment options, and the stigmas associated with seeking treatment.   Exercise & Equipment Safety: - Individual verbal instruction and demonstration of equipment use and safety with use of the equipment.      Pulmonary Rehab from 03/01/2015 in [place]   Date  12/12/14   Educator  L. Owens Shark, RT   Instruction Review Code  2- meets goals/outcomes      Infection Prevention: - Provides verbal and written material to individual with discussion of infection control including proper hand washing and proper equipment cleaning during exercise session.      Pulmonary Rehab from 03/01/2015 in [place]   Date  12/12/14   Educator  LB   Instruction Review Code  2- meets goals/outcomes       Falls Prevention: - Provides verbal and written material to individual with discussion of falls prevention and safety.   Diabetes: - Individual verbal and written instruction to review signs/symptoms of diabetes, desired ranges of glucose level fasting, after meals and with exercise. Advice that pre and post exercise glucose checks will be done for 3 sessions at entry of program.   Chronic Lung Diseases: - Group verbal and written instruction to review new updates, new respiratory medications, new advancements in procedures and treatments. Provide informative websites and "800" numbers of self-education.      Pulmonary Rehab from 03/01/2015 in [place]   Date  01/30/15   Educator  Carson Myrtle, RRT   Instruction Review Code  2- meets goals/outcomes      Lung Procedures: - Group verbal and written instruction to describe testing methods done to diagnose lung disease. Review the outcome of test results. Describe the treatment choices: Pulmonary Function Tests, ABGs and oximetry.      Pulmonary Rehab from 03/01/2015 in [place]   Date  01/27/15   Educator  SJ   Instruction Review Code  2- meets goals/outcomes      Energy Conservation: - Provide group verbal and written instruction for methods to conserve energy, plan and organize activities. Instruct on pacing techniques, use of adaptive equipment and posture/positioning to relieve shortness of breath.      Pulmonary Rehab from 03/01/2015 in [place]   Date  02/08/15   Educator  SW   Instruction Review Code  2- meets goals/outcomes      Triggers: - Group verbal and written instruction to review types of environmental controls: home humidity, furnaces, filters, dust mite/pet prevention, HEPA vacuums. To discuss weather changes, air quality and the benefits of nasal washing.      Pulmonary Rehab from 03/01/2015 in  [place]   Date  01/02/15   Educator  LB   Instruction Review Code  2- meets goals/outcomes      Exacerbations: - Group verbal and written instruction to provide: warning signs, infection symptoms, calling MD promptly, preventive modes, and value of vaccinations. Review: effective airway clearance, coughing and/or vibration techniques. Create an Sports administrator.   Oxygen: - Individual and group verbal and written instruction on oxygen therapy. Includes supplement oxygen, available portable oxygen systems, continuous and intermittent flow rates, oxygen safety, concentrators, and Medicare reimbursement for oxygen.   Respiratory Medications: - Group verbal and written instruction to review medications for lung disease. Drug class, frequency, complications, importance of spacers, rinsing mouth after steroid MDI's, and proper cleaning methods for nebulizers.      Pulmonary Rehab from 03/01/2015 in [place]   Date  12/06/14   Educator  LB   Instruction Review Code  2- meets goals/outcomes      AED/CPR: - Group verbal and written instruction with the use of models to demonstrate the basic use of the AED with the basic ABC's of resuscitation.      Pulmonary Rehab from 03/01/2015 in [place]   Date  02/03/15   Educator  CE   Instruction Review Code  2- meets goals/outcomes      Breathing Retraining: - Provides individuals verbal and written instruction on purpose, frequency, and proper technique of diaphragmatic breathing and pursed-lipped breathing. Applies individual practice skills.      Pulmonary Rehab from 03/01/2015 in [place]   Date  12/06/14   Educator  LB   Instruction Review Code  2- meets goals/outcomes      Anatomy and Physiology of the Lungs: - Group verbal and written instruction with the use of models to provide basic  lung anatomy and physiology related to function, structure and complications of lung disease.      Pulmonary Rehab from 03/01/2015 in [place]   Date  12/16/14   Educator  Martin   Instruction Review Code  2- meets goals/outcomes      Heart Failure: - Group verbal and written instruction on the basics of heart failure: signs/symptoms, treatments, explanation of ejection fraction, enlarged heart and cardiomyopathy.   Sleep Apnea: - Individual verbal and written instruction to review Obstructive Sleep Apnea. Review of risk factors, methods for diagnosing and types of masks and machines for OSA.   Anxiety: - Provides group, verbal and written instruction on the correlation between heart/lung disease and anxiety, treatment options, and management of anxiety.      Pulmonary Rehab from 03/01/2015 in [place]  [facility]   Date  02/15/15   Educator  Berle Mull, MSW   Instruction Review Code  2- Meets goals/outcomes      Relaxation: - Provides group, verbal and written instruction about the benefits of relaxation for patients with heart/lung disease. Also provides patients with examples of relaxation techniques.   Knowledge Questionnaire Score:     Knowledge Questionnaire Score - 12/06/14 1130    Knowledge Questionnaire Score   Pre Score -2      Personal Goals and Risk Factors at Admission:     Personal Goals and Risk Factors at Admission - 12/07/14 0835    Personal Goals and Risk Factors on Admission    Weight Management Yes   Intervention Learn and follow the exercise and diet guidelines while in the program. Utilize the nutrition and education classes to help gain knowledge of the diet and exercise expectations in the program   Increase Aerobic Exercise and Physical Activity Yes   Intervention While in program, learn and follow the exercise prescription taught. Start at a low level workload and increase workload  after able to maintain previous level for 30 minutes. Increase time before increasing intensity.   Understand more about Heart/Pulmonary Disease. Yes   Intervention While in program utilize professionals for any questions, and attend the education sessions. Great websites to use are www.americanheart.org or www.lung.org for reliable information.   Improve shortness of breath with ADL's Yes   Intervention While in program, learn and follow the exercise prescription taught. Start at a low level workload and increase workload ad advised by the exercise physiologist. Increase time before increasing intensity.   Develop more efficient breathing techniques such as purse lipped breathing and diaphragmatic breathing; and practicing self-pacing with activity Yes   Intervention While in program, learn and utilize the specific breathing techniques taught to you. Continue to practice and use the techniques as needed.   Increase knowledge of respiratory medications and ability to use respiratory devices properly.  Yes   Intervention While in program, learn to administer MDI, nebulizer, and spacer properly.;Learn to take respiratory medicine as ordered.;While in program, learn to Clean MDI, nebulizers, and spacers properly.   Diabetes Yes   Goal Blood glucose control identified by blood glucose values, HgbA1C. Participant verbalizes understanding of the signs/symptoms of hyper/hypo glycemia, proper foot care and importance of medication and nutrition plan for blood glucose control.   Intervention Provide nutrition & aerobic exercise along with prescribed medications to achieve blood glucose in normal ranges: Fasting 65-99 mg/dL   Hypertension Yes   Goal Participant will see blood pressure controlled within the values of 140/12m/Hg or within value directed by their physician.   Intervention Provide nutrition & aerobic exercise along with prescribed medications to achieve BP 140/90 or less.      Personal Goals  and Risk Factors Review:      Goals and Risk Factor Review      12/19/14 1130 01/02/15 1130 01/02/15 1233 01/04/15 1000 01/20/15 1130   Increase Aerobic Exercise and Physical Activity   Goals Progress/Improvement seen    Yes  Yes   Comments   wants to purchase a treadmill  [place] and [place] from her exercise in [facility].   Understand more about Heart/Pulmonary Disease   Goals Progress/Improvement seen   Yes      Comments  Ms MRizzolois attending our educational sessions and has contributed  very helpful information for  the group with some of our educational sessions.      Improve shortness of breath with ADL's   Goals Progress/Improvement seen    Yes     Comments   gardening is easier     Breathing Techniques   Goals Progress/Improvement seen  Yes  Yes     Comments Observed Ms Herzberg perform PLB during exercise on the equipment. She states she uses the technique at home with activities.       Increase knowledge of respiratory medications   Goals Progress/Improvement seen     Yes    Comments    Discussed MDI use of her Symbicort, Albuterol, and Spacer - properly using      01/25/15 1256 02/17/15 1404         Weight Management   Goals Progress/Improvement seen No       Comments Has not seen progress in this area since starting the program, however, her arthritis makes it difficult to do a lot of physical activity and some days she can do nothing because of the pain.        Increase Aerobic Exercise and Physical Activity   Goals Progress/Improvement seen  Yes No      Comments Is challenged by the exercise in class and has been able to make progressions, however, her arthritis causes her to have to step back sometimes. She added elevation to the treadmill, but is unable to accomplish this goal on bad arthritis days. She has a hottub that she does water exercises in and this has helped the arthritis. She can do  gardening, shopping and activities around the house easier now.  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 Her doctor told her to do the treadmill in order to loosen the joints. She is now doing all of her exercise time on the treadmill in three intervals of 10-15 minutes.       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 Also attends classes at [facility] on various topics and enjoys learning in general.  Quinn says she is learning a lot on how to manage the combination of chronic diseases that she has. Her doctors are still trying to find the right medication doses and the right plan to address all of her issues.       Improve shortness of breath with ADL's   Goals Progress/Improvement seen  Yes       Comments Can do activities around the house easier and is involved in several clubs. She can manage a more full schedule now that she has more stamina       Breathing Techniques   Goals Progress/Improvement seen  Yes Yes      Comments Uses PLB and conserves energy when she feels short of breath, especially when she is outside [place] said she thinks she is a more efficient breather now and is using breathing techniques to conserve her energy and to distract her from joint pain she feels elsewhere. She uses breathing techniques to help her focus.          Personal Goals Discharge:    Comments: 30 Day Review

## 2015-03-07 ENCOUNTER — Encounter: Payer: Self-pay | Admitting: Respiratory Therapy

## 2015-03-07 DIAGNOSIS — J4520 Mild intermittent asthma, uncomplicated: Secondary | ICD-10-CM

## 2015-03-07 NOTE — Progress Notes (Signed)
Daily Session Note  Patient Details  Name: Elim Peale MRN: [id number] Date of Birth: 11-Apr-1946 Referring Provider:  No ref. provider found  Encounter Date: 03/06/2015  Check In: 1000am        Exercise Prescription Changes - 03/06/15 1500    Exercise Review   Progression Yes   Response to Exercise   Blood Pressure (Admit) 112/60 mmHg   Blood Pressure (Exercise) 138/70 mmHg   Blood Pressure (Exit) 120/70 mmHg   Heart Rate (Admit) 93 bpm   Heart Rate (Exercise) 98 bpm   Heart Rate (Exit) 84 bpm   Oxygen Saturation (Admit) 94 %   Oxygen Saturation (Exercise) 65 %   Oxygen Saturation (Exit) 94 %   Rating of Perceived Exertion (Exercise) 13   Perceived Dyspnea (Exercise) 4   Duration Progress to 50 minutes of aerobic without signs/symptoms of physical distress   Intensity THRR unchanged   Progression Continue progressive overload as per policy without signs/symptoms or physical distress.   Resistance Training   Training Prescription Yes   Weight 1   Reps 10-12   Treadmill   MPH 1.9  Doing 10 minute intervals x 2   Grade 0   Minutes 40   REL-XR   Level 3   Watts 28   Minutes 11      Goals Met:  Proper associated with RPD/PD & O2 Sat Independence with exercise equipment Exercise tolerated well Strength training completed today  Goals Unmet:  Not Applicable  Goals Comments: Ms Ronning was feeling better today with her arthritis pain and did 10 minutes on the XR in addition to the TM.   Dr. Emily Filbert is Medical Director for [place] and [facility].

## 2015-03-10 ENCOUNTER — Encounter: Payer: Self-pay | Admitting: Respiratory Therapy

## 2015-03-10 ENCOUNTER — Encounter: Payer: Medicare Other | Admitting: *Deleted

## 2015-03-10 DIAGNOSIS — G4733 Obstructive sleep apnea (adult) (pediatric): Secondary | ICD-10-CM

## 2015-03-10 DIAGNOSIS — J4520 Mild intermittent asthma, uncomplicated: Secondary | ICD-10-CM

## 2015-03-10 NOTE — Progress Notes (Signed)
Daily Session Note  Patient Details  Name: Pattye Meda MRN: [id number] Date of Birth: 01-09-1946 Referring Provider:  Erby Pian, MD  Encounter Date: 03/10/2015  Check In:     Session Check In - 03/10/15 1300    Check-In   Staff Present Candiss Norse MS, ACSM CEP Exercise Physiologist;Stacey Blanch Media RRT, RCP Respiratory Therapist;Carroll Enterkin RN, BSN   ER physicians immediately available to respond to emergencies [facility] immediately available ER MD   Physician(s) Dr. Jimmye Norman, Dr. Burlene Arnt   Medication changes reported     No   Fall or balance concerns reported    No   Warm-up and Cool-down Performed on first and last piece of equipment   VAD Patient? No   Pain Assessment   Currently in Pain? No/denies         Goals Met:  Proper associated with RPD/PD & O2 Sat Exercise tolerated well  Goals Unmet:  Not Applicable  Goals Comments: [place] said she is tired today and she states she is tired about every other day. Was able to do her Pulm Rehab exercise on Treadmill today.    Dr. Emily Filbert is Medical Director for [place] and [facility].

## 2015-03-13 ENCOUNTER — Encounter: Payer: Medicare Other | Admitting: *Deleted

## 2015-03-13 DIAGNOSIS — G4730 Sleep apnea, unspecified: Secondary | ICD-10-CM

## 2015-03-13 DIAGNOSIS — J4520 Mild intermittent asthma, uncomplicated: Secondary | ICD-10-CM

## 2015-03-13 NOTE — Progress Notes (Signed)
Daily Session Note  Patient Details  Name: Urania Pearlman MRN: [id number] Date of Birth: 16-Nov-1945 Referring Provider:  Erby Pian, MD  Encounter Date: 03/13/2015  Check In:     Session Check In - 03/13/15 1223    Check-In   Staff Present Laureen Owens Shark BS, RRT, Respiratory Therapist;Kelly Alfonso Patten, ACSM CEP Exercise Physiologist;Steven Way BS, ACSM EP-C, Exercise Physiologist   ER physicians immediately available to respond to emergencies [facility] immediately available ER MD   Physician(s) Edd Fabian and Lord   Medication changes reported     No   Fall or balance concerns reported    No   Warm-up and Cool-down Performed on first and last piece of equipment   VAD Patient? No   Pain Assessment   Currently in Pain? No/denies   Multiple Pain Sites No         Goals Met:  Proper associated with RPD/PD & O2 Sat Independence with exercise equipment Exercise tolerated well Strength training completed today  Goals Unmet:  Not Applicable  Goals Comments: Progressing with exercise goals. Ms Felmlee states her pain level has improved today at "2" , and she did exercise on both the TM and RB. Her prednisone is at 37m until her appointment with the rheumatologist.    Dr. MEmily Filbertis Medical Director for [place] [facility].

## 2015-03-15 ENCOUNTER — Encounter: Payer: Medicare Other | Admitting: *Deleted

## 2015-03-15 DIAGNOSIS — J4520 Mild intermittent asthma, uncomplicated: Secondary | ICD-10-CM

## 2015-03-15 DIAGNOSIS — G4730 Sleep apnea, unspecified: Secondary | ICD-10-CM

## 2015-03-15 NOTE — Progress Notes (Signed)
Daily Session Note  Patient Details  Name: Mary Cantrell MRN: [id number] Date of Birth: [birth year] Referring Provider:  Erby Pian, MD  Encounter Date: 03/15/2015  Check In:     Session Check In - 03/15/15 1200    Check-In   Staff Present Candiss Norse MS, ACSM CEP Exercise Physiologist;Laureen Janell Quiet, RRT, Respiratory Therapist;Steven Way BS, ACSM EP-C, Exercise Physiologist   ER physicians immediately available to respond to emergencies [facility] immediately available ER MD   Physician(s) Jimmye Norman and Archie Balboa   Medication changes reported     No   Fall or balance concerns reported    No   Warm-up and Cool-down Performed on first and last piece of equipment   VAD Patient? No   Pain Assessment   Currently in Pain? No/denies   Multiple Pain Sites No         Goals Met:  Proper associated with RPD/PD & O2 Sat Independence with exercise equipment Using PLB without cueing & demonstrates good technique Exercise tolerated well Strength training completed today  Goals Unmet:  Not Applicable  Goals Comments: [name] is beginning to tolerate other machines in addition to the treadmill. It continues to be delicate process trying to treat all of [name]'s conditions and balance exercise with her symptoms.    Dr. Emily Filbert is Medical Director for [place] and [facility].

## 2015-03-17 ENCOUNTER — Other Ambulatory Visit: Payer: Self-pay | Admitting: Family Medicine

## 2015-03-17 ENCOUNTER — Encounter: Payer: Medicare Other | Admitting: *Deleted

## 2015-03-17 ENCOUNTER — Ambulatory Visit: Payer: Medicare Other | Admitting: Family Medicine

## 2015-03-17 DIAGNOSIS — G4730 Sleep apnea, unspecified: Secondary | ICD-10-CM

## 2015-03-17 DIAGNOSIS — J4520 Mild intermittent asthma, uncomplicated: Secondary | ICD-10-CM | POA: Diagnosis not present

## 2015-03-17 NOTE — Progress Notes (Signed)
Daily Session Note  Patient Details  Name: Rindi Beechy MRN: [id number] Date of Birth: 1946-06-18 Referring Provider:  Erby Pian, MD  Encounter Date: 03/17/2015  Check In:     Session Check In - 03/17/15 1216    Check-In   Staff Present Frederich Cha RRT, RCP Respiratory Therapist;Renee Dillard Essex MS, ACSM CEP Exercise Physiologist;Carroll Enterkin RN, BSN   ER physicians immediately available to respond to emergencies [facility] immediately available ER MD   Physician(s) Kerman Passey and Thomasene Lot   Medication changes reported     No   Fall or balance concerns reported    No   Warm-up and Cool-down Performed on first and last piece of equipment   VAD Patient? No   Pain Assessment   Currently in Pain? No/denies   Multiple Pain Sites No         Goals Met:  Proper associated with RPD/PD & O2 Sat Independence with exercise equipment Using PLB without cueing & demonstrates good technique Exercise tolerated well Strength training completed today  Goals Unmet:  Not Applicable  Goals Comments: Trenyce was able to do the recumbent bike again today without pain and has a productive exercise sessions.    Dr. Emily Filbert is Medical Director for [place] and [facility].

## 2015-03-20 DIAGNOSIS — J4520 Mild intermittent asthma, uncomplicated: Secondary | ICD-10-CM | POA: Diagnosis not present

## 2015-03-20 DIAGNOSIS — G4733 Obstructive sleep apnea (adult) (pediatric): Secondary | ICD-10-CM

## 2015-03-20 NOTE — Progress Notes (Signed)
Daily Session Note  Patient Details  Name: Mary Cantrell MRN: [id number] Date of Birth: Feb 03, 1946 Referring Provider:  Erby Pian, MD  Encounter Date: 03/20/2015  Check In:     Session Check In - 03/20/15 1244    Check-In   Staff Present Lestine Box BS, ACSM EP-C, Exercise Physiologist;Carroll Enterkin RN, BSN;Laureen [employer], RRT, Respiratory Therapist   ER physicians immediately available to respond to emergencies [facility] immediately available ER MD   Physician(s) quale and williams   Medication changes reported     No   Fall or balance concerns reported    No   Warm-up and Cool-down Performed on first and last piece of equipment   VAD Patient? No   Pain Assessment   Currently in Pain? No/denies         Goals Met:  Proper associated with RPD/PD & O2 Sat Exercise tolerated well No report of cardiac concerns or symptoms Strength training completed today  Goals Unmet:  Not Applicable  Goals Comments:    Dr. Emily Filbert is Medical Director for [place] and [facility].

## 2015-03-22 ENCOUNTER — Encounter: Payer: Medicare Other | Admitting: *Deleted

## 2015-03-22 DIAGNOSIS — J4520 Mild intermittent asthma, uncomplicated: Secondary | ICD-10-CM | POA: Diagnosis not present

## 2015-03-22 DIAGNOSIS — G4733 Obstructive sleep apnea (adult) (pediatric): Secondary | ICD-10-CM

## 2015-03-22 NOTE — Progress Notes (Signed)
Daily Session Note  Patient Details  Name: Mary Cantrell MRN: [id number] Date of Birth: [birth year] Referring Provider:  Erby Pian, MD  Encounter Date: 03/22/2015  Check In:     Session Check In - 03/22/15 1238    Check-In   Staff Present Laureen Owens Shark BS, RRT, Respiratory Therapist;Renee Dillard Essex MS, ACSM CEP Exercise Physiologist;Carroll Enterkin RN, BSN   ER physicians immediately available to respond to emergencies [facility] immediately available ER MD   Physician(s) Dr. Lucita Lora and Dr. Kerman Passey   Medication changes reported     No   Fall or balance concerns reported    No   Warm-up and Cool-down Performed on first and last piece of equipment   VAD Patient? No   Pain Assessment   Currently in Pain? Yes   Pain Score 5    Pain Location Knee   Pain Descriptors / Indicators Constant   Pain Type Chronic pain   Aggravating Factors  Recumbent bike sometimes makes [name] 's knee pain worse   Effect of Pain on Daily Activities [name] uses her knee brace alot and has for years         Goals Met:  Proper associated with RPD/PD & O2 Sat Exercise tolerated well  Goals Unmet:  Not Applicable  Goals Comments: [name] has chronic knee pain at times which limits her on the bike some.    Dr. Emily Filbert is Medical Director for [place] and [facility].

## 2015-03-24 ENCOUNTER — Encounter: Payer: Medicare Other | Admitting: *Deleted

## 2015-03-24 VITALS — Ht 66.0 in | Wt 201.7 lb

## 2015-03-24 DIAGNOSIS — J4520 Mild intermittent asthma, uncomplicated: Secondary | ICD-10-CM | POA: Diagnosis not present

## 2015-03-24 DIAGNOSIS — G4730 Sleep apnea, unspecified: Secondary | ICD-10-CM

## 2015-03-24 NOTE — Progress Notes (Signed)
Daily Session Note  Patient Details  Name: Mary Cantrell MRN: [id number] Date of Birth: 29-Oct-1945 Referring Provider:  Erby Pian, MD  Encounter Date: 03/24/2015  Check In:     Session Check In - 03/24/15 1225    Check-In   Staff Present Frederich Cha RRT, RCP Respiratory Therapist;Arlissa Monteverde Dillard Essex MS, ACSM CEP Exercise Physiologist;Carroll Enterkin RN, BSN   ER physicians immediately available to respond to emergencies [facility] immediately available ER MD   Physician(s) Cinda Quest and K[place]   Medication changes reported     No   Fall or balance concerns reported    No   Warm-up and Cool-down Performed on first and last piece of equipment   VAD Patient? No   VAD patient   Has back up controller? No   Pain Assessment   Currently in Pain? No/denies   Multiple Pain Sites No           Exercise Prescription Changes - 03/24/15 1200    Exercise Review   Progression Yes   Response to Exercise   Comments Marlane did well with her post 6MW test today considering all the health issues she has been having. Her distance was less than at intake and we discussed reasons this may be (surgeries she has had since then, pain, etc.). She did give a full effrot and was commended for it. Her treadmill time has been pushed back due to pain and symptoms she has been having and we discussed slowly progressing back to where she was.    Duration Progress to 50 minutes of aerobic without signs/symptoms of physical distress   Intensity THRR unchanged   Progression Continue progressive overload as per policy without signs/symptoms or physical distress.   Resistance Training   Training Prescription Yes   Weight 1   Reps 10-12   Treadmill   MPH 1.9  Doing 10 minute intervals x 2   Grade 0   Minutes 14   REL-XR   Level 3   Watts 28   Minutes 11      Goals Met:  Proper associated with RPD/PD & O2 Sat Independence with exercise equipment Using PLB without cueing & demonstrates good  technique Exercise tolerated well Personal goals reviewed Strength training completed today  Goals Unmet:  Not Applicable  Goals Comments: Completed post 6MW today.    Dr. Emily Filbert is Medical Director for [place] and [facility].

## 2015-03-27 ENCOUNTER — Encounter: Payer: Medicare Other | Attending: Specialist | Admitting: *Deleted

## 2015-03-27 DIAGNOSIS — J4520 Mild intermittent asthma, uncomplicated: Secondary | ICD-10-CM | POA: Insufficient documentation

## 2015-03-27 DIAGNOSIS — G4730 Sleep apnea, unspecified: Secondary | ICD-10-CM

## 2015-03-27 NOTE — Progress Notes (Signed)
Daily Session Note  Patient Details  Name: Mary Cantrell MRN: [id number] Date of Birth: 12-30-1945 Referring Provider:  Steele Sizer, MD  Encounter Date: 03/27/2015  Check In:     Session Check In - 03/27/15 1214    Check-In   Staff Present Laureen Owens Shark BS, RRT, Respiratory Therapist;Lylian Sanagustin RN, BSN;Steven Way BS, ACSM EP-C, Exercise Physiologist   ER physicians immediately available to respond to emergencies [facility] immediately available ER MD   Physician(s) Dr. Cinda Quest, Dr. Corky Downs   Medication changes reported     No   Fall or balance concerns reported    No   Warm-up and Cool-down Performed on first and last piece of equipment   VAD Patient? No   VAD patient   Has back up controller? No   Pain Assessment   Currently in Pain? Other (Comment)  Same chronic pain esp [name] knee.    Pain Score 4    Pain Location Knee   Pain Orientation Left   Pain Descriptors / Indicators Constant   Pain Type Chronic pain   Pain Onset More than a month ago   Effect of Pain on Daily Activities [name] has used her knee brace at times which helps.         Goals Met:  Proper associated with RPD/PD & O2 Sat Exercise tolerated well  Goals Unmet:  Not Applicable  Goals Comments: Doing well on Treadmill time increase.   Dr. Emily Filbert is Medical Director for [place] and [facility].

## 2015-03-27 NOTE — Progress Notes (Signed)
Pulmonary Individual Treatment Plan  Patient Details  Name: Mary Cantrell MRN: [id number] Date of Birth: [birth year] Referring Provider:  Steele Sizer, MD  Initial Encounter Date:    Visit Diagnosis: Sleep apnea  Patient's Home Medications on Admission:  Current outpatient prescriptions:  .  albuterol (VENTOLIN HFA) 108 (90 BASE) MCG/ACT inhaler, Inhale 2 puffs into the lungs every 4 (four) hours as needed for wheezing (takes every am and then as needed.). , Disp: , Rfl:  .  aspirin 81 MG tablet, Take 81 mg by mouth every morning. , Disp: , Rfl:  .  budesonide-formoterol (SYMBICORT) 160-4.5 MCG/ACT inhaler, Inhale 2 puffs into the lungs every morning. , Disp: , Rfl:  .  Cinnamon 500 MG capsule, Take 500 mg by mouth every morning. , Disp: , Rfl:  .  cyanocobalamin 100 MCG tablet, Take 100 mcg by mouth every morning. , Disp: , Rfl:  .  diphenhydrAMINE (BENADRYL) 25 MG tablet, Take 25 mg by mouth at bedtime as needed (nose gets clogged up with CPAP machine). , Disp: , Rfl:  .  fluticasone (FLONASE) 50 MCG/ACT nasal spray, Place 2 sprays into both nostrils every morning. , Disp: , Rfl:  .  gabapentin (NEURONTIN) 100 MG capsule, Take 1 capsule (100 mg total) by mouth 3 (three) times daily. (Patient taking differently: Take 100 mg by mouth at bedtime. 2 capsules at bedtime), Disp: 180 capsule, Rfl: 4 .  glipiZIDE (GLIPIZIDE XL) 5 MG 24 hr tablet, Take 1 tablet (5 mg total) by mouth daily with breakfast., Disp: 90 tablet, Rfl: 0 .  glucose blood (ONE TOUCH ULTRA TEST) test strip, 1 each by Other route 2 (two) times daily. Use as instructed, Disp: 100 each, Rfl: 2 .  HYDROcodone-acetaminophen (NORCO) 5-325 MG per tablet, Take 1 tablet by mouth every 6 (six) hours as needed for moderate pain., Disp: 30 tablet, Rfl: 0 .  loratadine (CLARITIN) 10 MG tablet, Take 1 tablet (10 mg total) by mouth daily. (Patient taking differently: Take 10 mg by mouth daily as needed. ), Disp: 60 tablet, Rfl: 0 .   losartan (COZAAR) 50 MG tablet, TAKE 1 TABLET DAILY FOR BLOOD PRESSURE, Disp: 90 tablet, Rfl: 1 .  meloxicam (MOBIC) 7.5 MG tablet, Take by mouth. On hold while taking prednisone, Disp: , Rfl:  .  metFORMIN (GLUCOPHAGE-XR) 500 MG 24 hr tablet, One in am and 2 in pm (Patient taking differently: 500 mg. One in am and 2 in pm), Disp: 90 tablet, Rfl: 2 .  pravastatin (PRAVACHOL) 40 MG tablet, Take 40 mg by mouth at bedtime. , Disp: , Rfl:  .  predniSONE (DELTASONE) 5 MG tablet, TAKE 1 TABLET (5 MG TOTAL) BY MOUTH 3 (THREE) TIMES DAILY., Disp: 45 tablet, Rfl: 0 .  ranitidine (ZANTAC) 150 MG tablet, Take 1 tablet (150 mg total) by mouth 2 (two) times daily., Disp: 60 tablet, Rfl: 0 .  spironolactone-hydrochlorothiazide (ALDACTAZIDE) 25-25 MG per tablet, TAKE ONE-HALF (1/2) TO ONE TABLET DAILY FOR BLOOD PRESSURE (Patient taking differently: TAKE ONE-HALF (1/2) TO ONE TABLET DAILY FOR BLOOD PRESSURE in am), Disp: 90 tablet, Rfl: 1 .  vitamin C (ASCORBIC ACID) 500 MG tablet, Take 1 tablet by mouth at bedtime. , Disp: , Rfl:   Past Medical History: Past Medical History  Diagnosis Date  . Trochanteric bursitis of right hip   . Irritable bowel syndrome   . Depression   . TMJ (dislocation of temporomandibular joint)   . Regurgitation   . Tear of left rotator cuff   .  SOB (shortness of breath)   . Post-void dribbling   . Maculopathy   . OA (osteoarthritis)     Hips  . Nuclear sclerosis of both eyes   . Asthma     Moderate  . Achilles tendinitis of right lower extremity   . Hypertension   . Hypoxemia   . OSA (obstructive sleep apnea)   . Chronic back pain   . Allergy   . Hyperlipidemia   . NASH (nonalcoholic steatohepatitis)   . Psoriasis   . Gastroparesis     DM    Tobacco Use: History  Smoking status  . Never Smoker   Smokeless tobacco  . Never Used    Labs: Recent Review Flowsheet Data    Labs for ITP Cardiac and Pulmonary Rehab Latest Ref Rng 04/22/2014 08/30/2014   Cholestrol 0  - 200 mg/dL 159 -   LDLCALC - 77 -   HDL 35 - 70 mg/dL 48 -   Trlycerides 40 - 160 mg/dL 170(A) -   Hemoglobin A1c 4.0 - 6.0 % - 7.3(A)         POCT Glucose      12/12/14 1130 12/14/14 1130 12/16/14 1442       POCT Blood Glucose   Pre-Exercise 193 mg/dL       Post-Exercise 117 mg/dL       Pre-Exercise #2  218 mg/dL      Post-Exercise #2  159 mg/dL      Pre-Exercise #3   203 mg/dL     Post-Exercise #3   113 mg/dL        ADL UCSD:     ADL UCSD      12/06/14 1130 03/24/15 1525     ADL UCSD   ADL Phase Entry Exit    SOB Score total 73 54    Rest 2 0    Walk 2 1    Stairs 4 3    Bath 3 3    Dress 2 2    Shop 4 4        Pulmonary Function Assessment:     Pulmonary Function Assessment - 12/06/14 1130    Initial Spirometry Results   FVC% 84 %   FEV1% 88 %   FEV1/FVC Ratio 83.25      Exercise Target Goals:    Exercise Program Goal: Individual exercise prescription set with THRR, safety & activity barriers. Participant demonstrates ability to understand and report RPE using BORG scale, to self-measure pulse accurately, and to acknowledge the importance of the exercise prescription.  Exercise Prescription Goal: Starting with aerobic activity 30 plus minutes a day, 3 days per week for initial exercise prescription. Provide home exercise prescription and guidelines that participant acknowledges understanding prior to discharge.  Activity Barriers & Risk Stratification:     Activity Barriers & Risk Stratification - 12/06/14 1130    Activity Barriers & Risk Stratification   Activity Barriers Shortness of Breath;Deconditioning;Balance Concerns   Risk Stratification Moderate      6 Minute Walk:     6 Minute Walk      12/06/14 1240 03/24/15 1236     6 Minute Walk   Phase Initial Discharge    Distance 1320 feet 1150 feet    Walk Time 6 minutes 6 minutes    Resting HR 78 bpm 94 bpm    Resting BP 118/74 mmHg 122/68 mmHg    Max Ex. HR 84 bpm 98 bpm     Max Ex. BP 132/84  mmHg 140/68 mmHg    RPE 13 12    Perceived Dyspnea  3 3    Symptoms No Yes (comment)    Comments  Hip pain and fibromyalgia pain        Initial Exercise Prescription:     Initial Exercise Prescription - 12/06/14 1700    Date of Initial Exercise Prescription   Date 12/06/14   Treadmill   MPH 1.5   Grade 0   Minutes 10   Recumbant Bike   Level 1   RPM 40   Watts 20   Minutes 10   NuStep   Level 4   Watts 40   Minutes 10   Arm Ergometer   Level 1   Watts 10   Minutes 10   Arm/Foot Ergometer   Level 1   Watts 12   Minutes 10   Recumbant Elliptical   Level 1   RPM 40   Watts 40   Minutes 10   Elliptical   Level 1   Speed 3   Minutes 1   REL-XR   Level 1   Watts 40   Minutes 10   Prescription Details   Frequency (times per week) 3   Duration Progress to 30 minutes of continuous aerobic without signs/symptoms of physical distress   Intensity   THRR REST +  30   Ratings of Perceived Exertion 11-15   Perceived Dyspnea 2-4   Progression Continue progressive overload as per policy without signs/symptoms or physical distress.   Resistance Training   Training Prescription Yes   Weight 1   Reps 10-12      Exercise Prescription Changes:     Exercise Prescription Changes      12/12/14 1400 12/14/14 1400 12/19/14 1200 01/03/15 0800 01/04/15 1400   Exercise Review   Progression   Yes Yes Yes   Response to Exercise   Blood Pressure (Admit) 140/84 mmHg 140/84 mmHg 140/84 mmHg 130/82 mmHg  Entry for 01/02/2015 120/64 mmHg   Blood Pressure (Exercise) 148/80 mmHg 148/80 mmHg 148/80 mmHg 140/80 mmHg 140/70 mmHg   Blood Pressure (Exit) 130/76 mmHg 130/76 mmHg 130/76 mmHg 122/80 mmHg 124/80 mmHg   Heart Rate (Admit) 87 bpm 87 bpm 87 bpm 82 bpm 64 bpm   Heart Rate (Exercise) 92 bpm 92 bpm 92 bpm 96 bpm 99 bpm   Heart Rate (Exit) 76 bpm 76 bpm 76 bpm 80 bpm 85 bpm   Oxygen Saturation (Admit) 92 % 92 % 92 % 95 % 92 %   Oxygen Saturation (Exercise) 95  % 95 % 95 % 95 % 94 %   Oxygen Saturation (Exit) 92 % 92 % 92 % 95 % 95 %   Rating of Perceived Exertion (Exercise) _0 Perceived Dyspnea (Exercise) _1 Resistance Training   Training Prescription _2    Weight _3 Reps 10-12 10-12 10-12 10-12 10-12   Treadmill   MPH 1.5 1.5 1.5 1.7 1.9   Grade 0 0 0 0 0   Minutes _4 Recumbant Bike   Level  _5 RPM  40 40 40 50   Watts     15   Minutes  15  NuStep bothered Ms Lipe's right heal, new goal RB 15  NuStep bothered Ms Coil's right heal, new goal RB 15  NuStep bothered  Ms Heidel's right heal, new goal RB    NuStep   Level _0 Watts 40 40 40 40    Minutes _1 REL-XR   Level _2 Watts 40 40 40 50 50   Minutes _3 01/06/15 1200 01/09/15 1500 01/23/15 1300 02/03/15 1100 02/06/15 1500   Exercise Review   Progression _4    Response to Exercise   Blood Pressure (Admit) 120/64 mmHg 120/64 mmHg 120/64 mmHg 120/64 mmHg 128/70 mmHg   Blood Pressure (Exercise) 140/70 mmHg 140/70 mmHg 140/70 mmHg 140/70 mmHg 150/90 mmHg   Blood Pressure (Exit) 124/80 mmHg 124/80 mmHg 124/80 mmHg 124/80 mmHg 122/62 mmHg   Heart Rate (Admit) 64 bpm 64 bpm 64 bpm 64 bpm 89 bpm   Heart Rate (Exercise) 99 bpm 99 bpm 99 bpm 99 bpm 84 bpm   Heart Rate (Exit) 85 bpm 85 bpm 85 bpm 85 bpm 77 bpm   Oxygen Saturation (Admit) 92 % 92 % 92 % 92 % 94 %   Oxygen Saturation (Exercise) 94 % 94 % 94 % 94 % 97 %   Oxygen Saturation (Exit) 95 % 95 % 95 % 95 % 96 %   Rating of Perceived Exertion (Exercise) _5 Perceived Dyspnea (Exercise) _6 Resistance Training   Training Prescription _7    Weight _8 Reps 10-12 10-12 10-12 10-12 10-12   Treadmill   MPH 1.9 2.2 2.2 2.2 2.2   Grade 0 0 1 1 0   Minutes _9 Recumbant Bike   Level 3    3   RPM 50    50   Watts 15       Minutes     15   NuStep    Level  _10 Watts  50 50 60 60   Minutes  _11 REL-XR   Level _12 Watts 60 60 60 60 60   Minutes _13 03/03/15 1200 03/06/15 1500 03/24/15 1200       Exercise Review   Progression Yes Yes Yes     Response to Exercise   Blood Pressure (Admit) 128/70 mmHg 112/60 mmHg      Blood Pressure (Exercise) 150/90 mmHg 138/70 mmHg      Blood Pressure (Exit) 122/62 mmHg 120/70 mmHg      Heart Rate (Admit) 89 bpm 93 bpm      Heart Rate (Exercise) 84 bpm 98 bpm      Heart Rate (Exit) 77 bpm 84 bpm      Oxygen Saturation (Admit) 94 % 94 %      Oxygen Saturation (Exercise) 97 % 65 %      Oxygen Saturation (Exit) 96 % 94 %      Rating of Perceived Exertion (Exercise) 15 13      Perceived Dyspnea (Exercise) 3 4      Comments   Renelle did well with her post 6MW test today considering all the health issues she has been having. Her distance was less than at intake and we discussed reasons this may be (surgeries she has had since  then, pain, etc.). She did give a full effrot and was commended for it. Her treadmill time has been pushed back due to pain and symptoms she has been having and we discussed slowly progressing back to where she was.      Duration Progress to 50 minutes of aerobic without signs/symptoms of physical distress Progress to 50 minutes of aerobic without signs/symptoms of physical distress Progress to 50 minutes of aerobic without signs/symptoms of physical distress     Intensity THRR unchanged THRR unchanged THRR unchanged     Progression Continue progressive overload as per policy without signs/symptoms or physical distress. Continue progressive overload as per policy without signs/symptoms or physical distress. Continue progressive overload as per policy without signs/symptoms or physical distress.     Resistance Training   Training Prescription Yes Yes Yes     Weight _0 Reps 10-12 10-12 10-12     Treadmill   MPH 2.2  Doing 10 minute  intervals x 4 1.9  Doing 10 minute intervals x 2 1.9  Doing 10 minute intervals x 2     Grade 0 0 0     Minutes 40 40 14     REL-XR   Level  3 3     Watts  28 28     Minutes  11 11        Discharge Exercise Prescription (Final Exercise Prescription Changes):     Exercise Prescription Changes - 03/24/15 1200    Exercise Review   Progression Yes   Response to Exercise   Comments Vinette did well with her post 6MW test today considering all the health issues she has been having. Her distance was less than at intake and we discussed reasons this may be (surgeries she has had since then, pain, etc.). She did give a full effrot and was commended for it. Her treadmill time has been pushed back due to pain and symptoms she has been having and we discussed slowly progressing back to where she was.    Duration Progress to 50 minutes of aerobic without signs/symptoms of physical distress   Intensity THRR unchanged   Progression Continue progressive overload as per policy without signs/symptoms or physical distress.   Resistance Training   Training Prescription Yes   Weight 1   Reps 10-12   Treadmill   MPH 1.9  Doing 10 minute intervals x 2   Grade 0   Minutes 14   REL-XR   Level 3   Watts 28   Minutes 11       Nutrition:  Target Goals: Understanding of nutrition guidelines, daily intake of sodium <15102m, cholesterol <2028m calories 30% from fat and 7% or less from saturated fats, daily to have 5 or more servings of fruits and vegetables.  Biometrics:      Post Biometrics - 03/24/15 1239     Post  Biometrics   Height _1  (1.676 m)   Weight 201 lb 11.2 oz (91.491 kg)   Waist Circumference 42.5 inches   Hip Circumference 43.5 inches   Waist to Hip Ratio 0.98 %   BMI (Calculated) 32.6      Nutrition Therapy Plan and Nutrition Goals:     Nutrition Therapy & Goals - 12/06/14 1130    Nutrition Therapy   Diet Ms MuMigliacciorefers not to meet with the dietitian; she states  she known what to do; she eats out mostly, but does eat only half and takes  the rest to go home; she would like to loss 20lbs      Nutrition Discharge: Rate Your Plate Scores:   Psychosocial: Target Goals: Acknowledge presence or absence of depression, maximize coping skills, provide positive support system. Participant is able to verbalize types and ability to use techniques and skills needed for reducing stress and depression.  Initial Review & Psychosocial Screening:     Initial Psych Review & Screening - 12/06/14 1130    Family Dynamics   Good Support System? Yes   Comments Her husband and 2 children are very supportive; her sister lives close and is supportive.   Barriers   Psychosocial barriers to participate in program There are no identifiable barriers or psychosocial needs.;The patient should benefit from training in stress management and relaxation.   Screening Interventions   Interventions Encouraged to exercise;Program counselor consult      Quality of Life Scores:     Quality of Life - 12/06/14 1130    Quality of Life Scores   Health/Function Pre 16.59 %   Socioeconomic Pre 23.93 %   Psych/Spiritual Pre 24 %   Family Pre 26.1 %   GLOBAL Pre 20.81 %      PHQ-9:     Recent Review Flowsheet Data    Depression screen [facility] 2/9 03/24/2015 02/20/2015 12/21/2014 12/06/2014   Decreased Interest 0 0 0 0   Down, Depressed, Hopeless 0 0 0 0   PHQ - 2 Score 0 0 0 0      Psychosocial Evaluation and Intervention:     Psychosocial Evaluation - 12/12/14 1254    Psychosocial Evaluation & Interventions   Comments Counselor met with Ms. Wachsmuth for initial psychosocial evaluation.   She is a 69 year old female who has asthma, sleep apnea and type 2 diabetes.  She has a strong support system with a spouse of 43 years.  Ms. Shores states she has been sleeping better since she began using a CPAP machine.  Her appetite is "okay" and she reports no current symptoms of depression or  anxiety, although there was a history of depression "many years ago."  Ms. Manrique states that her health issues and subsequent limitations are stressful for her making traveling and gardening difficult.  She presents in a positive mood and has goals to increase her stamina, strength, and be able to breathe better.  Counselor encouraged her to consistently exercise to accomplish these goals.          Psychosocial Re-Evaluation:     Psychosocial Re-Evaluation      01/04/15 1037           Psychosocial Re-Evaluation   Comments Counselor met with Ms. Hendel today for a follow up.  She reports being stronger and having more energy to garden and do those things that she enjoys since beginning this program.  She is working hard on her goals and it appears to be paying off for Ms. Pauling.           Education: Education Goals: Education classes will be provided on a weekly basis, covering required topics. Participant will state understanding/return demonstration of topics presented.  Learning Barriers/Preferences:     Learning Barriers/Preferences - 12/06/14 1130    Learning Barriers/Preferences   Learning Barriers None   Learning Preferences Group Instruction;Individual Instruction;Pictoral;Skilled Demonstration;Verbal Instruction;Video;Written Material      Education Topics: Initial Evaluation Education: - Verbal, written and demonstration of respiratory meds, RPE/PD scales, oximetry and breathing techniques. Instruction on use of nebulizers  and MDIs: cleaning and proper use, rinsing mouth with steroid doses and importance of monitoring MDI activations.          Pulmonary Rehab from 03/15/2015 in [place]   Date  12/06/14   Educator  LB   Instruction Review Code  2- meets goals/outcomes      General Nutrition Guidelines/Fats and Fiber: -Group instruction provided by verbal, written material, models and posters to present the general guidelines  for heart healthy nutrition. Gives an explanation and review of dietary fats and fiber.      Pulmonary Rehab from 03/15/2015 in [place]   Date  02/06/15   Educator  Karolee Stamps, RD   Instruction Review Code  2- meets goals/outcomes      Controlling Sodium/Reading Food Labels: -Group verbal and written material supporting the discussion of sodium use in heart healthy nutrition. Review and explanation with models, verbal and written materials for utilization of the food label.   Exercise Physiology & Risk Factors: - Group verbal and written instruction with models to review the exercise physiology of the cardiovascular system and associated critical values. Details cardiovascular disease risk factors and the goals associated with each risk factor.      Pulmonary Rehab from 03/15/2015 in [place]   Date  03/01/15   Educator  SW   Instruction Review Code  2- meets goals/outcomes      Aerobic Exercise & Resistance Training: - Gives group verbal and written discussion on the health impact of inactivity. On the components of aerobic and resistive training programs and the benefits of this training and how to safely progress through these programs.      Pulmonary Rehab from 03/15/2015 in [place]   Date  12/28/14   Educator  SW   Instruction Review Code  2- meets goals/outcomes      Flexibility, Balance, General Exercise Guidelines: - Provides group verbal and written instruction on the benefits of flexibility and balance training programs. Provides general exercise guidelines with specific guidelines to those with heart or lung disease. Demonstration and skill practice provided.      Pulmonary Rehab from 03/15/2015 in [place]   Date  01/25/15   Educator  SW   Instruction Review Code  2- meets goals/outcomes      Stress  Management: - Provides group verbal and written instruction about the health risks of elevated stress, cause of high stress, and healthy ways to reduce stress.   Depression: - Provides group verbal and written instruction on the correlation between heart/lung disease and depressed mood, treatment options, and the stigmas associated with seeking treatment.      Pulmonary Rehab from 03/15/2015 in [place]   Date  03/15/15   Educator  [facility]   Instruction Review Code  2- meets goals/outcomes      Exercise & Equipment Safety: - Individual verbal instruction and demonstration of equipment use and safety with use of the equipment.      Pulmonary Rehab from 03/15/2015 in [place]   Date  12/12/14   Educator  L. Owens Shark, RT   Instruction Review Code  2- meets goals/outcomes      Infection Prevention: - Provides verbal and written material to individual with discussion of infection control including proper hand washing and proper equipment cleaning during exercise session.  Pulmonary Rehab from 03/15/2015 in [place]   Date  12/12/14   Educator  LB   Instruction Review Code  2- meets goals/outcomes      Falls Prevention: - Provides verbal and written material to individual with discussion of falls prevention and safety.   Diabetes: - Individual verbal and written instruction to review signs/symptoms of diabetes, desired ranges of glucose level fasting, after meals and with exercise. Advice that pre and post exercise glucose checks will be done for 3 sessions at entry of program.   Chronic Lung Diseases: - Group verbal and written instruction to review new updates, new respiratory medications, new advancements in procedures and treatments. Provide informative websites and "800" numbers of self-education.      Pulmonary Rehab from 03/15/2015 in [place]   Date  01/30/15   Educator  Carson Myrtle, RRT   Instruction Review Code  2- meets goals/outcomes      Lung Procedures: - Group verbal and written instruction to describe testing methods done to diagnose lung disease. Review the outcome of test results. Describe the treatment choices: Pulmonary Function Tests, ABGs and oximetry.      Pulmonary Rehab from 03/15/2015 in [place]   Date  01/27/15   Educator  [place]   Instruction Review Code  2- meets goals/outcomes      Energy Conservation: - Provide group verbal and written instruction for methods to conserve energy, plan and organize activities. Instruct on pacing techniques, use of adaptive equipment and posture/positioning to relieve shortness of breath.      Pulmonary Rehab from 03/15/2015 in [place]   Date  02/08/15   Educator  SW   Instruction Review Code  2- meets goals/outcomes      Triggers: - Group verbal and written instruction to review types of environmental controls: home humidity, furnaces, filters, dust mite/pet prevention, HEPA vacuums. To discuss weather changes, air quality and the benefits of nasal washing.      Pulmonary Rehab from 03/15/2015 in [place]   Date  01/02/15   Educator  LB   Instruction Review Code  2- meets goals/outcomes      Exacerbations: - Group verbal and written instruction to provide: warning signs, infection symptoms, calling MD promptly, preventive modes, and value of vaccinations. Review: effective airway clearance, coughing and/or vibration techniques. Create an Sports administrator.   Oxygen: - Individual and group verbal and written instruction on oxygen therapy. Includes supplement oxygen, available portable oxygen systems, continuous and intermittent flow rates, oxygen safety, concentrators, and Medicare reimbursement for oxygen.   Respiratory  Medications: - Group verbal and written instruction to review medications for lung disease. Drug class, frequency, complications, importance of spacers, rinsing mouth after steroid MDI's, and proper cleaning methods for nebulizers.      Pulmonary Rehab from 03/15/2015 in [place]   Date  12/06/14   Educator  LB   Instruction Review Code  2- meets goals/outcomes      AED/CPR: - Group verbal and written instruction with the use of models to demonstrate the basic use of the AED with the basic ABC's of resuscitation.      Pulmonary Rehab from 03/15/2015 in [place]   Date  02/03/15   Educator  CE   Instruction Review Code  2- meets goals/outcomes      Breathing  Retraining: - Provides individuals verbal and written instruction on purpose, frequency, and proper technique of diaphragmatic breathing and pursed-lipped breathing. Applies individual practice skills.      Pulmonary Rehab from 03/15/2015 in [place]   Date  12/06/14   Educator  LB   Instruction Review Code  2- meets goals/outcomes      Anatomy and Physiology of the Lungs: - Group verbal and written instruction with the use of models to provide basic lung anatomy and physiology related to function, structure and complications of lung disease.      Pulmonary Rehab from 03/15/2015 in [place]   Date  03/10/15   Educator  Frederich Cha, RRT   Instruction Review Code  2- meets goals/outcomes      Heart Failure: - Group verbal and written instruction on the basics of heart failure: signs/symptoms, treatments, explanation of ejection fraction, enlarged heart and cardiomyopathy.   Sleep Apnea: - Individual verbal and written instruction to review Obstructive Sleep Apnea. Review of risk factors, methods for diagnosing and types of masks and machines for OSA.   Anxiety: -  Provides group, verbal and written instruction on the correlation between heart/lung disease and anxiety, treatment options, and management of anxiety.      Pulmonary Rehab from 03/15/2015 in [place]   Date  02/15/15   Educator  Berle Mull, MSW   Instruction Review Code  2- Meets goals/outcomes      Relaxation: - Provides group, verbal and written instruction about the benefits of relaxation for patients with heart/lung disease. Also provides patients with examples of relaxation techniques.   Knowledge Questionnaire Score:     Knowledge Questionnaire Score - 12/06/14 1130    Knowledge Questionnaire Score   Pre Score -2      Personal Goals and Risk Factors at Admission:     Personal Goals and Risk Factors at Admission - 12/07/14 0835    Personal Goals and Risk Factors on Admission    Weight Management Yes   Intervention Learn and follow the exercise and diet guidelines while in the program. Utilize the nutrition and education classes to help gain knowledge of the diet and exercise expectations in the program   Increase Aerobic Exercise and Physical Activity Yes   Intervention While in program, learn and follow the exercise prescription taught. Start at a low level workload and increase workload after able to maintain previous level for 30 minutes. Increase time before increasing intensity.   Understand more about Heart/Pulmonary Disease. Yes   Intervention While in program utilize professionals for any questions, and attend the education sessions. Great websites to use are www.americanheart.org or www.lung.org for reliable information.   Improve shortness of breath with ADL's Yes   Intervention While in program, learn and follow the exercise prescription taught. Start at a low level workload and increase workload ad advised by the exercise physiologist. Increase time before increasing intensity.   Develop more efficient breathing techniques  such as purse lipped breathing and diaphragmatic breathing; and practicing self-pacing with activity Yes   Intervention While in program, learn and utilize the specific breathing techniques taught to you. Continue to practice and use the techniques as needed.   Increase knowledge of respiratory medications and ability to use respiratory devices properly.  Yes   Intervention While in program, learn to administer MDI, nebulizer, and spacer properly.;Learn to take respiratory medicine as ordered.;While in program, learn to Clean MDI, nebulizers,  and spacers properly.   Diabetes Yes   Goal Blood glucose control identified by blood glucose values, HgbA1C. Participant verbalizes understanding of the signs/symptoms of hyper/hypo glycemia, proper foot care and importance of medication and nutrition plan for blood glucose control.   Intervention Provide nutrition & aerobic exercise along with prescribed medications to achieve blood glucose in normal ranges: Fasting 65-99 mg/dL   Hypertension Yes   Goal Participant will see blood pressure controlled within the values of 140/53m/Hg or within value directed by their physician.   Intervention Provide nutrition & aerobic exercise along with prescribed medications to achieve BP 140/90 or less.      Personal Goals and Risk Factors Review:      Goals and Risk Factor Review      12/19/14 1130 01/02/15 1130 01/02/15 1233 01/04/15 1000 01/20/15 1130   Increase Aerobic Exercise and Physical Activity   Goals Progress/Improvement seen    Yes  Yes   Comments   wants to purchase a treadmill  [place] from her exercise in [facility].   Understand more about Heart/Pulmonary Disease   Goals Progress/Improvement seen   Yes      Comments  Ms MOwensbyis attending our educational sessions and has contributed very helpful information for  the group with some of our educational sessions.       Improve shortness of breath with ADL's   Goals Progress/Improvement seen    Yes     Comments   gardening is easier     Breathing Techniques   Goals Progress/Improvement seen  Yes  Yes     Comments Observed Ms MBoehperform PLB during exercise on the equipment. She states she uses the technique at home with activities.       Increase knowledge of respiratory medications   Goals Progress/Improvement seen     Yes    Comments    Discussed MDI use of her Symbicort, Albuterol, and Spacer - properly using      01/25/15 1256 02/17/15 1404 03/10/15 1554 03/22/15 1130     Weight Management   Goals Progress/Improvement seen No       Comments Has not seen progress in this area since starting the program, however, her arthritis makes it difficult to do a lot of physical activity and some days she can do nothing because of the pain.        Increase Aerobic Exercise and Physical Activity   Goals Progress/Improvement seen  Yes No      Comments Is challenged by the exercise in class and has been able to make progressions, however, her arthritis causes her to have to step back sometimes. She added elevation to the treadmill, but is unable to accomplish this goal on bad arthritis days. She has a hottub that she does water exercises in and this has helped the arthritis. She can do gardening, shopping and activities around the house easier now.  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 Her doctor told her to do the treadmill in order to loosen the joints. She is now doing all of her exercise time on the treadmill in three intervals of 10-15 minutes.       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 Also [facility] classes [facility] on various  topics and enjoys learning in general.  Metztli says she is learning a lot on how to manage the combination of chronic diseases that she has. Her doctors are still trying to find the right medication doses and the right plan to address all of her issues.  Lung anatomy education was provided today.  Ms. Dobbins was also given a few website addresses that can teach her more about heart and lung disease.     Improve shortness of breath with ADL's   Goals Progress/Improvement seen  Yes   Yes    Comments Can do activities around the house easier and is involved in several clubs. She can manage a more full schedule now that she has more stamina       Breathing Techniques   Goals Progress/Improvement seen  Yes Yes      Comments Uses PLB and conserves energy when she feels short of breath, especially when she is outside [place] said she thinks she is a more efficient breather now and is using breathing techniques to conserve her energy and to distract her from joint pain she feels elsewhere. She uses breathing techniques to help her focus.          Personal Goals Discharge (Final Personal Goals and Risk Factors Review):      Goals and Risk Factor Review - 03/22/15 1130    Improve shortness of breath with ADL's   Goals Progress/Improvement seen  Yes      Comments: Khamil did well on the treadmill time increase today even with her chronic knee pain.

## 2015-03-28 NOTE — Telephone Encounter (Signed)
Other Rheumatologist would be out even farther. It's best to keep this appt.

## 2015-03-31 ENCOUNTER — Encounter: Payer: Medicare Other | Admitting: Respiratory Therapy

## 2015-03-31 DIAGNOSIS — J4520 Mild intermittent asthma, uncomplicated: Secondary | ICD-10-CM

## 2015-03-31 DIAGNOSIS — G4730 Sleep apnea, unspecified: Secondary | ICD-10-CM

## 2015-03-31 NOTE — Telephone Encounter (Signed)
na

## 2015-03-31 NOTE — Progress Notes (Signed)
Daily Session Note  Patient Details  Name: Mary Cantrell MRN: [id number] Date of Birth: [birth year] Referring Provider:  Erby Pian, MD  Encounter Date: 03/31/2015  Check In:     Session Check In - 03/31/15 1429    Check-In   Staff Present Frederich Cha RRT, RCP Respiratory Therapist;Renee Dillard [place] MS, ACSM CEP Exercise Physiologist;Other  Judithann Sheen   ER physicians immediately available to respond to emergencies [facility] immediately available ER MD   Physician(s) Dr. Jacqualine Code and Dr. Darl Householder   Medication changes reported     No   Fall or balance concerns reported    No   Warm-up and Cool-down Performed on first and last piece of equipment   VAD Patient? No   VAD patient   Has back up controller? No   Pain Assessment   Currently in Pain? No/denies         Goals Met:  Proper associated with RPD/PD & O2 Sat Independence with exercise equipment Exercise tolerated well No report of cardiac concerns or symptoms Strength training completed today  Goals Unmet:  Not Applicable  Goals Comments: [name] has been instructed by her doctor to decrease her Prednisone from 74m to 12.5 mg.   Dr. MEmily Filbertis Medical Director for [facility] [facility].

## 2015-04-01 ENCOUNTER — Other Ambulatory Visit: Payer: Self-pay | Admitting: Family Medicine

## 2015-04-03 ENCOUNTER — Encounter: Payer: Medicare Other | Admitting: *Deleted

## 2015-04-03 ENCOUNTER — Encounter: Payer: Self-pay | Admitting: Respiratory Therapy

## 2015-04-03 ENCOUNTER — Ambulatory Visit: Payer: Medicare Other | Admitting: Family Medicine

## 2015-04-03 DIAGNOSIS — G4730 Sleep apnea, unspecified: Secondary | ICD-10-CM

## 2015-04-03 DIAGNOSIS — G4733 Obstructive sleep apnea (adult) (pediatric): Secondary | ICD-10-CM

## 2015-04-03 DIAGNOSIS — J4520 Mild intermittent asthma, uncomplicated: Secondary | ICD-10-CM | POA: Diagnosis not present

## 2015-04-03 NOTE — Progress Notes (Signed)
Pulmonary Individual Treatment Plan  Patient Details  Name: Mary Cantrell MRN: [id number] Date of Birth: 06/02/1946 Referring Provider:  Dr Wallene Huh  Initial Encounter Date: 12/06/2014  Visit Diagnosis: No diagnosis found.  Patient's Home Medications on Admission:  Current outpatient prescriptions:    albuterol (VENTOLIN HFA) 108 (90 BASE) MCG/ACT inhaler, Inhale 2 puffs into the lungs every 4 (four) hours as needed for wheezing (takes every am and then as needed.). , Disp: , Rfl:    aspirin 81 MG tablet, Take 81 mg by mouth every morning. , Disp: , Rfl:    budesonide-formoterol (SYMBICORT) 160-4.5 MCG/ACT inhaler, Inhale 2 puffs into the lungs every morning. , Disp: , Rfl:    Cinnamon 500 MG capsule, Take 500 mg by mouth every morning. , Disp: , Rfl:    cyanocobalamin 100 MCG tablet, Take 100 mcg by mouth every morning. , Disp: , Rfl:    diphenhydrAMINE (BENADRYL) 25 MG tablet, Take 25 mg by mouth at bedtime as needed (nose gets clogged up with CPAP machine). , Disp: , Rfl:    fluticasone (FLONASE) 50 MCG/ACT nasal spray, Place 2 sprays into both nostrils every morning. , Disp: , Rfl:    gabapentin (NEURONTIN) 100 MG capsule, Take 1 capsule (100 mg total) by mouth 3 (three) times daily. (Patient taking differently: Take 100 mg by mouth at bedtime. 2 capsules at bedtime), Disp: 180 capsule, Rfl: 4   glipiZIDE (GLIPIZIDE XL) 5 MG 24 hr tablet, Take 1 tablet (5 mg total) by mouth daily with breakfast., Disp: 90 tablet, Rfl: 0   glucose blood (ONE TOUCH ULTRA TEST) test strip, 1 each by Other route 2 (two) times daily. Use as instructed, Disp: 100 each, Rfl: 2   HYDROcodone-acetaminophen (NORCO) 5-325 MG per tablet, Take 1 tablet by mouth every 6 (six) hours as needed for moderate pain., Disp: 30 tablet, Rfl: 0   loratadine (CLARITIN) 10 MG tablet, Take 1 tablet (10 mg total) by mouth daily. (Patient taking differently: Take 10 mg by mouth daily as needed. ), Disp: 60 tablet, Rfl:  0   losartan (COZAAR) 50 MG tablet, TAKE 1 TABLET DAILY FOR BLOOD PRESSURE, Disp: 90 tablet, Rfl: 1   meloxicam (MOBIC) 7.5 MG tablet, Take by mouth. On hold while taking prednisone, Disp: , Rfl:    metFORMIN (GLUCOPHAGE-XR) 500 MG 24 hr tablet, One in am and 2 in pm (Patient taking differently: 500 mg. One in am and 2 in pm), Disp: 90 tablet, Rfl: 2   pravastatin (PRAVACHOL) 40 MG tablet, TAKE 1 TABLET EVERY EVENING FOR CHOLESTEROL, Disp: 90 tablet, Rfl: 3   predniSONE (DELTASONE) 5 MG tablet, TAKE 1 TABLET (5 MG TOTAL) BY MOUTH 3 (THREE) TIMES DAILY., Disp: 45 tablet, Rfl: 0   ranitidine (ZANTAC) 150 MG tablet, Take 1 tablet (150 mg total) by mouth 2 (two) times daily., Disp: 60 tablet, Rfl: 0   spironolactone-hydrochlorothiazide (ALDACTAZIDE) 25-25 MG per tablet, TAKE ONE-HALF (1/2) TO ONE TABLET DAILY FOR BLOOD PRESSURE (Patient taking differently: TAKE ONE-HALF (1/2) TO ONE TABLET DAILY FOR BLOOD PRESSURE in am), Disp: 90 tablet, Rfl: 1   vitamin C (ASCORBIC ACID) 500 MG tablet, Take 1 tablet by mouth at bedtime. , Disp: , Rfl:   Past Medical History: Past Medical History  Diagnosis Date   Trochanteric bursitis of right hip    Irritable bowel syndrome    Depression    TMJ (dislocation of temporomandibular joint)    Regurgitation    Tear of left rotator cuff  SOB (shortness of breath)    Post-void dribbling    Maculopathy    OA (osteoarthritis)     Hips   Nuclear sclerosis of both eyes    Asthma     Moderate   Achilles tendinitis of right lower extremity    Hypertension    Hypoxemia    OSA (obstructive sleep apnea)    Chronic back pain    Allergy    Hyperlipidemia    NASH (nonalcoholic steatohepatitis)    Psoriasis    Gastroparesis     DM    Tobacco Use: History  Smoking status   Never Smoker   Smokeless tobacco   Never Used    Labs: Recent Review Flowsheet Data    Labs for ITP Cardiac and Pulmonary Rehab Latest Ref Rng  04/22/2014 08/30/2014   Cholestrol 0 - 200 mg/dL 159 -   LDLCALC - 77 -   HDL 35 - 70 mg/dL 48 -   Trlycerides 40 - 160 mg/dL 170(A) -   Hemoglobin A1c 4.0 - 6.0 % - 7.3(A)         POCT Glucose      12/12/14 1130 12/14/14 1130 12/16/14 1442       POCT Blood Glucose   Pre-Exercise 193 mg/dL       Post-Exercise 117 mg/dL       Pre-Exercise #2  218 mg/dL      Post-Exercise #2  159 mg/dL      Pre-Exercise #3   203 mg/dL     Post-Exercise #3   113 mg/dL        ADL UCSD:     ADL UCSD      12/06/14 1130 02/17/15 0826 03/22/15 1130   ADL UCSD   ADL Phase Entry Mid Exit   SOB Score total 73 31 54   Rest 2 0 0   Walk 2 1 1    Stairs 4 3 3    Bath 3 1 3    Dress 2 2 2    Shop 4 1 4      03/24/15 1525       ADL UCSD   ADL Phase Exit     SOB Score total 54     Rest 0     Walk 1     Stairs 3     Bath 3     Dress 2     Shop 4         Pulmonary Function Assessment:     Pulmonary Function Assessment - 12/06/14 1130    Initial Spirometry Results   FVC% 84 %   FEV1% 88 %   FEV1/FVC Ratio 83.25      Exercise Target Goals:    Exercise Program Goal: Individual exercise prescription set with THRR, safety & activity barriers. Participant demonstrates ability to understand and report RPE using BORG scale, to self-measure pulse accurately, and to acknowledge the importance of the exercise prescription.  Exercise Prescription Goal: Starting with aerobic activity 30 plus minutes a day, 3 days per week for initial exercise prescription. Provide home exercise prescription and guidelines that participant acknowledges understanding prior to discharge.  Activity Barriers & Risk Stratification:     Activity Barriers & Risk Stratification - 12/06/14 1130    Activity Barriers & Risk Stratification   Activity Barriers Shortness of Breath;Deconditioning;Balance Concerns   Risk Stratification Moderate      6 Minute Walk:     6 Minute Walk      12/06/14 1240 03/24/15 1236  6 Minute Walk   Phase Initial Discharge    Distance 1320 feet 1150 feet    Walk Time 6 minutes 6 minutes    Resting HR 78 bpm 94 bpm    Resting BP 118/74 mmHg 122/68 mmHg    Max Ex. HR 84 bpm 98 bpm    Max Ex. BP 132/84 mmHg 140/68 mmHg    RPE 13 12    Perceived Dyspnea  3 3    Symptoms No Yes (comment)    Comments  Hip pain and fibromyalgia pain        Initial Exercise Prescription:     Initial Exercise Prescription - 12/06/14 1700    Date of Initial Exercise Prescription   Date 12/06/14   Treadmill   MPH 1.5   Grade 0   Minutes 10   Recumbant Bike   Level 1   RPM 40   Watts 20   Minutes 10   NuStep   Level 4   Watts 40   Minutes 10   Arm Ergometer   Level 1   Watts 10   Minutes 10   Arm/Foot Ergometer   Level 1   Watts 12   Minutes 10   Recumbant Elliptical   Level 1   RPM 40   Watts 40   Minutes 10   Elliptical   Level 1   Speed 3   Minutes 1   REL-XR   Level 1   Watts 40   Minutes 10   Prescription Details   Frequency (times per week) 3   Duration Progress to 30 minutes of continuous aerobic without signs/symptoms of physical distress   Intensity   THRR REST +  30   Ratings of Perceived Exertion 11-15   Perceived Dyspnea 2-4   Progression Continue progressive overload as per policy without signs/symptoms or physical distress.   Resistance Training   Training Prescription Yes   Weight 1   Reps 10-12      Exercise Prescription Changes:     Exercise Prescription Changes      12/12/14 1400 12/14/14 1400 12/19/14 1200 01/03/15 0800 01/04/15 1400   Exercise Review   Progression   Yes Yes Yes   Response to Exercise   Blood Pressure (Admit) 140/84 mmHg 140/84 mmHg 140/84 mmHg 130/82 mmHg  Entry for 01/02/2015 120/64 mmHg   Blood Pressure (Exercise) 148/80 mmHg 148/80 mmHg 148/80 mmHg 140/80 mmHg 140/70 mmHg   Blood Pressure (Exit) 130/76 mmHg 130/76 mmHg 130/76 mmHg 122/80 mmHg 124/80 mmHg   Heart Rate (Admit) 87 bpm 87 bpm 87 bpm 82 bpm  64 bpm   Heart Rate (Exercise) 92 bpm 92 bpm 92 bpm 96 bpm 99 bpm   Heart Rate (Exit) 76 bpm 76 bpm 76 bpm 80 bpm 85 bpm   Oxygen Saturation (Admit) 92 % 92 % 92 % 95 % 92 %   Oxygen Saturation (Exercise) 95 % 95 % 95 % 95 % 94 %   Oxygen Saturation (Exit) 92 % 92 % 92 % 95 % 95 %   Rating of Perceived Exertion (Exercise) 12 12 12 12 12    Perceived Dyspnea (Exercise) 3 3 3 2 4    Resistance Training   Training Prescription Yes Yes Yes Yes Yes   Weight 1 1 1 1 1    Reps 10-12 10-12 10-12 10-12 10-12   Treadmill   MPH 1.5 1.5 1.5 1.7 1.9   Grade 0 0 0 0 0   Minutes 10 10 15  15 15   Recumbant Bike   Level  1 1 1 3    RPM  40 40 40 50   Watts     15   Minutes  15  NuStep bothered Ms Trudo's right heal, new goal RB 15  NuStep bothered Ms Janota's right heal, new goal RB 15  NuStep bothered Ms Rotan's right heal, new goal RB    NuStep   Level 2 2 2 2     Watts 40 40 40 40    Minutes 15 15 15 15     REL-XR   Level 2 2 2 3 3    Watts 40 40 40 50 50   Minutes 10 10 10 15 10      01/06/15 1200 01/09/15 1500 01/23/15 1300 02/03/15 1100 02/06/15 1500   Exercise Review   Progression Yes Yes Yes Yes Yes   Response to Exercise   Blood Pressure (Admit) 120/64 mmHg 120/64 mmHg 120/64 mmHg 120/64 mmHg 128/70 mmHg   Blood Pressure (Exercise) 140/70 mmHg 140/70 mmHg 140/70 mmHg 140/70 mmHg 150/90 mmHg   Blood Pressure (Exit) 124/80 mmHg 124/80 mmHg 124/80 mmHg 124/80 mmHg 122/62 mmHg   Heart Rate (Admit) 64 bpm 64 bpm 64 bpm 64 bpm 89 bpm   Heart Rate (Exercise) 99 bpm 99 bpm 99 bpm 99 bpm 84 bpm   Heart Rate (Exit) 85 bpm 85 bpm 85 bpm 85 bpm 77 bpm   Oxygen Saturation (Admit) 92 % 92 % 92 % 92 % 94 %   Oxygen Saturation (Exercise) 94 % 94 % 94 % 94 % 97 %   Oxygen Saturation (Exit) 95 % 95 % 95 % 95 % 96 %   Rating of Perceived Exertion (Exercise) 12 12 12 12 15    Perceived Dyspnea (Exercise) 4 4 4 4 3    Resistance Training   Training Prescription Yes Yes Yes Yes Yes   Weight 1 1 1 1 1     Reps 10-12 10-12 10-12 10-12 10-12   Treadmill   MPH 1.9 2.2 2.2 2.2 2.2   Grade 0 0 1 1 0   Minutes 15 15 15 15 15    Recumbant Bike   Level 3    3   RPM 50    50   Watts 15       Minutes     15   NuStep   Level  3 3 5 5    Watts  50 50 60 60   Minutes  15 15 15 15    REL-XR   Level 3 3 3 3 3    Watts 60 60 60 60 60   Minutes 15 15 15 15 15      03/03/15 1200 03/06/15 1500 03/24/15 1200 03/27/15 1200 03/31/15 1400   Exercise Review   Progression Yes Yes Yes Yes    Response to Exercise   Blood Pressure (Admit) 128/70 mmHg 112/60 mmHg  118/64 mmHg    Blood Pressure (Exercise) 150/90 mmHg 138/70 mmHg  128/70 mmHg    Blood Pressure (Exit) 122/62 mmHg 120/70 mmHg  100/66 mmHg    Heart Rate (Admit) 89 bpm 93 bpm  76 bpm    Heart Rate (Exercise) 84 bpm 98 bpm  101 bpm    Heart Rate (Exit) 77 bpm 84 bpm  73 bpm    Oxygen Saturation (Admit) 94 % 94 %  95 %    Oxygen Saturation (Exercise) 97 % 65 %  93 %    Oxygen Saturation (Exit) 96 % 94 %  92 %    Rating of Perceived Exertion (Exercise) 15 13  12     Perceived Dyspnea (Exercise) 3 4  3     Comments   Deyana did well with her post 6MW test today considering all the health issues she has been having. Her distance was less than at intake and we discussed reasons this may be (surgeries she has had since then, pain, etc.). She did give a full effrot and was commended for it. Her treadmill time has been pushed back due to pain and symptoms she has been having and we discussed slowly progressing back to where she was.  Jearldine did well with her post 6MW test today considering all the health issues she has been having. Her distance was less than at intake and we discussed reasons this may be (surgeries she has had since then, pain, etc.). She did give a full effrot and was commended for it. Her treadmill time has been pushed back due to pain and symptoms she has been having and we discussed slowly progressing back to where she was.  Ms. Kissinger was  instructed by her doctor to decrease her prednisone from 71m to 12.555m  Duration Progress to 50 minutes of aerobic without signs/symptoms of physical distress Progress to 50 minutes of aerobic without signs/symptoms of physical distress Progress to 50 minutes of aerobic without signs/symptoms of physical distress Progress to 50 minutes of aerobic without signs/symptoms of physical distress    Intensity THRR unchanged THRR unchanged THRR unchanged THRR unchanged    Progression Continue progressive overload as per policy without signs/symptoms or physical distress. Continue progressive overload as per policy without signs/symptoms or physical distress. Continue progressive overload as per policy without signs/symptoms or physical distress. Continue progressive overload as per policy without signs/symptoms or physical distress.    Resistance Training   Training Prescription Yes Yes Yes Yes    Weight 1 1 1 1     Reps 10-12 10-12 10-12 10-12    Treadmill   MPH 2.2  Doing 10 minute intervals x 4 1.9  Doing 10 minute intervals x 2 1.9  Doing 10 minute intervals x 2 2.3  2 sets of 1587m    Grade 0 0 0 0    Minutes 40 40 14 15    Recumbant Bike   Level    2.5    RPM    40    Minutes    15    REL-XR   Level  3 3 3     Watts  28 28 28     Minutes  11 11 11     Home Exercise Plan   Plans to continue exercise at    [facility] - stationary bike; plans to purchase TM      04/03/15 1200 04/03/15 1500         Exercise Review   Progression Yes Yes      Response to Exercise   Blood Pressure (Admit)  120/66 mmHg      Blood Pressure (Exercise)  134/74 mmHg      Blood Pressure (Exit)  112/72 mmHg      Heart Rate (Admit)  107 bpm      Heart Rate (Exercise)  105 bpm      Heart Rate (Exit)  80 bpm      Oxygen Saturation (Admit)  96 %      Oxygen Saturation (Exercise)  94 %      Oxygen Saturation (Exit)  97 %  Rating of Perceived Exertion (Exercise)  12      Perceived Dyspnea (Exercise)   3      Comments Ms. Huizenga was instructed by her doctor to decrease her prednisone from 14m to 12.589mMs. Steinberger was instructed by her doctor to decrease her prednisone from 1563mo 12.5mg24m   Frequency  Add 2 additional days to program exercise sessions.      Duration  Progress to 50 minutes of aerobic without signs/symptoms of physical distress      Intensity  Rest + 30      Progression  Continue progressive overload as per policy without signs/symptoms or physical distress.      Resistance Training   Training Prescription  Yes      Weight  1      Reps  10-12      Treadmill   MPH  30      Grade  2.3      Minutes  0      Recumbant Bike   Level 3 3      RPM  40      Minutes 15 15      REL-XR   Level  3      Watts  44      Minutes  15      Home Exercise Plan   [facility]         Discharge Exercise Prescription (Final Exercise Prescription Changes):     Exercise Prescription Changes - 04/03/15 1500    Exercise Review   Progression Yes   Response to Exercise   Blood Pressure (Admit) 120/66 mmHg   Blood Pressure (Exercise) 134/74 mmHg   Blood Pressure (Exit) 112/72 mmHg   Heart Rate (Admit) 107 bpm   Heart Rate (Exercise) 105 bpm   Heart Rate (Exit) 80 bpm   Oxygen Saturation (Admit) 96 %   Oxygen Saturation (Exercise) 94 %   Oxygen Saturation (Exit) 97 %   Rating of Perceived Exertion (Exercise) 12   Perceived Dyspnea (Exercise) 3   Comments Ms. Lint was instructed by her doctor to decrease her prednisone from 15mg42m12.5mg  75mequency Add 2 additional days to program exercise sessions.   Duration Progress to 50 minutes of aerobic without signs/symptoms of physical distress   Intensity Rest + 30   Progression Continue progressive overload as per policy without signs/symptoms or physical distress.   Resistance Training   Training Prescription Yes   Weight 1   Reps 10-12   Treadmill   MPH 30    Grade 2.3   Minutes 0   Recumbant Bike   Level 3   RPM 40   Minutes 15   REL-XR   Level 3   Watts 44   Minutes 15   Home Exercise Plan   Plans to continue exercise at Home  [facility] to [facility] a TM       Nutrition:  Target Goals: Understanding of nutrition guidelines, daily intake of sodium <1500mg, 76mesterol <200mg, c72mies 30% from fat and 7% or less from saturated fats, daily to have 5 or more servings of fruits and vegetables.  Biometrics:      Post Biometrics - 03/24/15 1239     Post  Biometrics   Height 5' 6"  (1.676 m)   Weight 201 lb 11.2 oz (91.491 kg)   Waist Circumference 42.5  inches   Hip Circumference 43.5 inches   Waist to Hip Ratio 0.98 %   BMI (Calculated) 32.6      Nutrition Therapy Plan and Nutrition Goals:     Nutrition Therapy & Goals - 12/06/14 1130    Nutrition Therapy   Diet Ms Neuser prefers not to meet with the dietitian; she states she known what to do; she eats out mostly, but does eat only half and takes the rest to go home; she would like to loss 20lbs      Nutrition Discharge: Rate Your Plate Scores:   Psychosocial: Target Goals: Acknowledge presence or absence of depression, maximize coping skills, provide positive support system. Participant is able to verbalize types and ability to use techniques and skills needed for reducing stress and depression.  Initial Review & Psychosocial Screening:     Initial Psych Review & Screening - 12/06/14 1130    Family Dynamics   Good Support System? Yes   Comments Her husband and 2 children are very supportive; her sister lives close and is supportive.   Barriers   Psychosocial barriers to participate in program There are no identifiable barriers or psychosocial needs.;The patient should benefit from training in stress management and relaxation.   Screening Interventions   Interventions Encouraged to exercise;Program counselor consult      Quality of Life  Scores:     Quality of Life - 03/22/15 1130    Quality of Life Scores   Health/Function Post 9.13 %   Health/Function % Change -45.01 %   Socioeconomic Post 11.93 %   Socioeconomic % Change -50.15 %   Psych/Spiritual Post 16.29 %   Psych/Spiritual % Change -32.14 %   Family Post 0 %   Family % Change 100 %   GLOBAL Post 9.81 %   GLOBAL % Change -52.84 %      PHQ-9:     Recent Review Flowsheet Data    Depression screen [facility] 2/9 03/24/2015 03/22/2015 02/20/2015 12/21/2014 12/06/2014   Decreased Interest 0 0 0 0 0   Down, Depressed, Hopeless 0 0 0 0 0   PHQ - 2 Score 0 0 0 0 0      Psychosocial Evaluation and Intervention:     Psychosocial Evaluation - 04/03/15 1153    Discharge Psychosocial Assessment & Intervention   Comments Counselor met with Ms. Haldeman for discharge evaluation.  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  Since that Diagnosis, she reports feeling like she had to "start over" in this program.  Her stamina has improved and her breathing is "okay" with two recent episodes of shortness of breath that were "bad."  Ms. Zima reports she is on medication which is helping with the symptoms.  Her appetite and sleep are fine currently and she has maintained a positive mood through it all.  She plans for follow up to exercise with home equipment.  Ms. Maciolek has been a pleasure to work with in this program.        Psychosocial Re-Evaluation:     Psychosocial Re-Evaluation      01/04/15 1037           Psychosocial Re-Evaluation   Comments Counselor met with Ms. Vallo today for a follow up.  She reports being stronger and having more energy to garden and do those things that she enjoys since beginning this program.  She is working hard on her goals and it appears to be  paying off for Ms. Deen.           Education: Education Goals: Education classes will be provided on a weekly basis, covering required  topics. Participant will state understanding/return demonstration of topics presented.  Learning Barriers/Preferences:     Learning Barriers/Preferences - 12/06/14 1130    Learning Barriers/Preferences   Learning Barriers None   Learning Preferences Group Instruction;Individual Instruction;Pictoral;Skilled Demonstration;Verbal Instruction;Video;Written Material      Education Topics: Initial Evaluation Education: - Verbal, written and demonstration of respiratory meds, RPE/PD scales, oximetry and breathing techniques. Instruction on use of nebulizers and MDIs: cleaning and proper use, rinsing mouth with steroid doses and importance of monitoring MDI activations.          Pulmonary Rehab from 03/15/2015 in [place]   Date  12/06/14   Educator  LB   Instruction Review Code  2- meets goals/outcomes      General Nutrition Guidelines/Fats and Fiber: -Group instruction provided by verbal, written material, models and posters to present the general guidelines for heart healthy nutrition. Gives an explanation and review of dietary fats and fiber.      Pulmonary Rehab from 03/15/2015 in [place]   Date  02/06/15   Educator  Karolee Stamps, RD   Instruction Review Code  2- meets goals/outcomes      Controlling Sodium/Reading Food Labels: -Group verbal and written material supporting the discussion of sodium use in heart healthy nutrition. Review and explanation with models, verbal and written materials for utilization of the food label.   Exercise Physiology & Risk Factors: - Group verbal and written instruction with models to review the exercise physiology of the cardiovascular system and associated critical values. Details cardiovascular disease risk factors and the goals associated with each risk factor.      Pulmonary Rehab from 03/15/2015 in [place]   Date   03/01/15   Educator  SW   Instruction Review Code  2- meets goals/outcomes      Aerobic Exercise & Resistance Training: - Gives group verbal and written discussion on the health impact of inactivity. On the components of aerobic and resistive training programs and the benefits of this training and how to safely progress through these programs.      Pulmonary Rehab from 03/15/2015 in [place]   Date  12/28/14   Educator  SW   Instruction Review Code  2- meets goals/outcomes      Flexibility, Balance, General Exercise Guidelines: - Provides group verbal and written instruction on the benefits of flexibility and balance training programs. Provides general exercise guidelines with specific guidelines to those with heart or lung disease. Demonstration and skill practice provided.      Pulmonary Rehab from 03/15/2015 in [place]   Date  01/25/15   Educator  SW   Instruction Review Code  2- meets goals/outcomes      Stress Management: - Provides group verbal and written instruction about the health risks of elevated stress, cause of high stress, and healthy ways to reduce stress.   Depression: - Provides group verbal and written instruction on the correlation between heart/lung disease and depressed mood, treatment options, and the stigmas associated with seeking treatment.      Pulmonary Rehab from 03/15/2015 in [place]   Date  03/15/15   Educator  [facility]   Instruction Review Code  2- meets goals/outcomes      Exercise & Equipment Safety: - Individual verbal instruction and demonstration of equipment use and safety with use of the equipment.      Pulmonary Rehab from 03/15/2015 in [place]   Date  12/12/14   Educator  L. Owens Shark, RT   Instruction Review Code  2- meets goals/outcomes      Infection Prevention: - Provides verbal  and written material to individual with discussion of infection control including proper hand washing and proper equipment cleaning during exercise session.      Pulmonary Rehab from 03/15/2015 in [place]   Date  12/12/14   Educator  LB   Instruction Review Code  2- meets goals/outcomes      Falls Prevention: - Provides verbal and written material to individual with discussion of falls prevention and safety.   Diabetes: - Individual verbal and written instruction to review signs/symptoms of diabetes, desired ranges of glucose level fasting, after meals and with exercise. Advice that pre and post exercise glucose checks will be done for 3 sessions at entry of program.   Chronic Lung Diseases: - Group verbal and written instruction to review new updates, new respiratory medications, new advancements in procedures and treatments. Provide informative websites and "800" numbers of self-education.      Pulmonary Rehab from 03/15/2015 in [place]   Date  01/30/15   Educator  Carson Myrtle, RRT   Instruction Review Code  2- meets goals/outcomes      Lung Procedures: - Group verbal and written instruction to describe testing methods done to diagnose lung disease. Review the outcome of test results. Describe the treatment choices: Pulmonary Function Tests, ABGs and oximetry.      Pulmonary Rehab from 03/15/2015 in [place]   Date  01/27/15   Educator  [employer]   Instruction Review Code  2- meets goals/outcomes      Energy Conservation: - Provide group verbal and written instruction for methods to conserve energy, plan and organize activities. Instruct on pacing techniques, use of adaptive equipment and posture/positioning to relieve shortness of breath.      Pulmonary Rehab from 03/15/2015 in [place]   Date  02/08/15   Educator  SW    Instruction Review Code  2- meets goals/outcomes      Triggers: - Group verbal and written instruction to review types of environmental controls: home humidity, furnaces, filters, dust mite/pet prevention, HEPA vacuums. To discuss weather changes, air quality and the benefits of nasal washing.      Pulmonary Rehab from 03/15/2015 in [place]   Date  01/02/15   Educator  LB   Instruction Review Code  2- meets goals/outcomes      Exacerbations: - Group verbal and written instruction to provide: warning signs, infection symptoms, calling MD promptly, preventive modes, and value of vaccinations. Review: effective airway clearance, coughing and/or vibration techniques. Create an Sports administrator.   Oxygen: - Individual and group verbal and written instruction on oxygen therapy. Includes supplement oxygen, available portable oxygen systems, continuous and intermittent flow rates, oxygen safety, concentrators, and Medicare reimbursement for oxygen.   Respiratory Medications: - Group verbal and written instruction to review medications for lung disease. Drug class, frequency, complications, importance of spacers, rinsing mouth after steroid MDI's, and proper cleaning methods for nebulizers.      Pulmonary  Rehab from 03/15/2015 in [place]   Date  12/06/14   Educator  LB   Instruction Review Code  2- meets goals/outcomes      AED/CPR: - Group verbal and written instruction with the use of models to demonstrate the basic use of the AED with the basic ABC's of resuscitation.      Pulmonary Rehab from 03/15/2015 in [place]   Date  02/03/15   Educator  CE   Instruction Review Code  2- meets goals/outcomes      Breathing Retraining: - Provides individuals verbal and written instruction on purpose, frequency, and proper technique of diaphragmatic breathing and pursed-lipped  breathing. Applies individual practice skills.      Pulmonary Rehab from 03/15/2015 in [place]   Date  12/06/14   Educator  LB   Instruction Review Code  2- meets goals/outcomes      Anatomy and Physiology of the Lungs: - Group verbal and written instruction with the use of models to provide basic lung anatomy and physiology related to function, structure and complications of lung disease.      Pulmonary Rehab from 03/15/2015 in [place]   Date  03/10/15   Educator  Frederich Cha, RRT   Instruction Review Code  2- meets goals/outcomes      Heart Failure: - Group verbal and written instruction on the basics of heart failure: signs/symptoms, treatments, explanation of ejection fraction, enlarged heart and cardiomyopathy.   Sleep Apnea: - Individual verbal and written instruction to review Obstructive Sleep Apnea. Review of risk factors, methods for diagnosing and types of masks and machines for OSA.   Anxiety: - Provides group, verbal and written instruction on the correlation between heart/lung disease and anxiety, treatment options, and management of anxiety.      Pulmonary Rehab from 03/15/2015 in [place]   Date  02/15/15   Educator  Berle Mull, MSW   Instruction Review Code  2- Meets goals/outcomes      Relaxation: - Provides group, verbal and written instruction about the benefits of relaxation for patients with heart/lung disease. Also provides patients with examples of relaxation techniques.   Knowledge Questionnaire Score:     Knowledge Questionnaire Score - 12/06/14 1130    Knowledge Questionnaire Score   Pre Score -2      Personal Goals and Risk Factors at Admission:     Personal Goals and Risk Factors at Admission - 12/07/14 0835    Personal Goals and Risk Factors on Admission    Weight Management Yes   Intervention Learn and follow the  exercise and diet guidelines while in the program. Utilize the nutrition and education classes to help gain knowledge of the diet and exercise expectations in the program   Increase Aerobic Exercise and Physical Activity Yes   Intervention While in program, learn and follow the exercise prescription taught. Start at a low level workload and increase workload after able to maintain previous level for 30 minutes. Increase time before increasing intensity.   Understand more about Heart/Pulmonary Disease. Yes   Intervention While in program utilize professionals for any questions, and attend the education sessions. Great websites to use are www.americanheart.org or www.lung.org for reliable information.   Improve shortness of breath with ADL's Yes   Intervention While in program, learn and follow the exercise prescription taught. Start at a low level workload and increase  workload ad advised by the exercise physiologist. Increase time before increasing intensity.   Develop more efficient breathing techniques such as purse lipped breathing and diaphragmatic breathing; and practicing self-pacing with activity Yes   Intervention While in program, learn and utilize the specific breathing techniques taught to you. Continue to practice and use the techniques as needed.   Increase knowledge of respiratory medications and ability to use respiratory devices properly.  Yes   Intervention While in program, learn to administer MDI, nebulizer, and spacer properly.;Learn to take respiratory medicine as ordered.;While in program, learn to Clean MDI, nebulizers, and spacers properly.   Diabetes Yes   Goal Blood glucose control identified by blood glucose values, HgbA1C. Participant verbalizes understanding of the signs/symptoms of hyper/hypo glycemia, proper foot care and importance of medication and nutrition plan for blood glucose control.   Intervention Provide nutrition & aerobic exercise along with prescribed  medications to achieve blood glucose in normal ranges: Fasting 65-99 mg/dL   Hypertension Yes   Goal Participant will see blood pressure controlled within the values of 140/67m/Hg or within value directed by their physician.   Intervention Provide nutrition & aerobic exercise along with prescribed medications to achieve BP 140/90 or less.      Personal Goals and Risk Factors Review:      Goals and Risk Factor Review      12/19/14 1130 01/02/15 1130 01/02/15 1233 01/04/15 1000 01/20/15 1130   Increase Aerobic Exercise and Physical Activity   Goals Progress/Improvement seen    Yes  Yes   Comments   wants to purchase a treadmill  [place] walking and stairs from her exercise in [facility].   Understand more about Heart/Pulmonary Disease   Goals Progress/Improvement seen   Yes      Comments  Ms MSirmonsis attending our educational sessions and has contributed very helpful information for  the group with some of our educational sessions.      Improve shortness of breath with ADL's   Goals Progress/Improvement seen    Yes     Comments   gardening is easier     Breathing Techniques   Goals Progress/Improvement seen  Yes  Yes     Comments Observed Ms MGrothausperform PLB during exercise on the equipment. She states she uses the technique at home with activities.       Increase knowledge of respiratory medications   Goals Progress/Improvement seen     Yes    Comments    Discussed MDI use of her Symbicort, Albuterol, and Spacer - properly using      01/25/15 1256 02/17/15 1404 03/10/15 1554 03/22/15 1130 03/28/15 0828   Weight Management   Goals Progress/Improvement seen No       Comments Has not seen progress in this area since starting the program, however, her arthritis makes it difficult to do a lot of physical activity and some days she can do nothing because of the pain.     Has not seen progress in this area since starting  the program, however, her arthritis makes it difficult to do a lot of physical activity and some days she can do nothing because of the pain.    Increase Aerobic Exercise and Physical Activity   Goals Progress/Improvement seen  Yes No   Yes   Comments Is challenged by the exercise in class and has been able to make progressions, however, her  arthritis causes her to have to step back sometimes. She added elevation to the treadmill, but is unable to accomplish this goal on bad arthritis days. She has a hottub that she does water exercises in and this has helped the arthritis. She can do gardening, shopping and activities around the house easier now.  Due to Tristyn's joint disease (arteritis) she can no longer do seated exercises because her joints will "lock up". Her doctor told her to do the treadmill in order to loosen the joints. She is now doing all of her exercise time on the treadmill in three intervals of 10-15 minutes.    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 She admits she has good days and bad days as far as pain. She has worked her way back to 2 36mn sets on the TM and 152ms on the BX. She plans to continue her exercise on her stationary  bike at h ome and is purchasing a TM for home as well.   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 [facility] and enjoys learning in general.  DiBesseays she is learning a lot on how to manage the combination of chronic diseases that she has. Her doctors are still trying to find the right medication doses and the right plan to address all of her issues.  Lung anatomy education was provided today.  Ms. MuDabneyas also given a few website addresses that can teach her more about  heart and lung disease.  Ms MuDunsworthas [place] our [place] and always has information to share with the group. She works hard to keep up with new information on Sleep Apnea and Asthma.   Improve shortness of breath with ADL's   Goals Progress/Improvement seen  Yes   Yes Yes   Comments Can do activities around the house easier and is involved in several clubs. She can manage a more full schedule now that she has more stamina    Ms MuCountesstays very activie with involvement in several clubs and does activities around the house with less shortness of breath and more stamina.   Breathing Techniques   Goals Progress/Improvement seen  Yes Yes      Comments Uses PLB and conserves energy when she feels short of breath, especially when she is outside [place] she thinks she is a more efficient breather now and is using breathing techniques to conserve her energy and to distract her from joint pain she feels elsewhere. She uses breathing techniques to help her focus.    DiAlmiraaid she thinks she is a more efficient breather now and is using breathing techniques to conserve her energy and to distract her from joint pain she feels elsewhere. She uses breathing techniques to help her focus.    Increase knowledge of respiratory medications   Goals Progress/Improvement seen      Yes   Comments     Ms MuMccomberas good understanding and use of her MDI's: Symbicort, Albuterol, Spacer. She also has good understanding of her CPAP and is very compliant with use every night.   Diabetes   Goal     --  No problems with glucose levels with exercise class.  Hypertension   Goal     --  Maintaining acceptable levels of BP with rest and exercise.     04/03/15 1533           Weight Management   Comments Has not seen progress in this area since starting the program, however, her arthritis makes it difficult to do a lot of physical activity and some days she can do nothing because of the pain.        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 She admits she has good days and bad days as far as pain. She has worked her way back to 2 4mn sets on the TM and 175ms on the BX. She plans to continue her exercise on her stationary  bike at h ome and is purchasing a TM for home as well.       Understand more about Heart/Pulmonary Disease   Comments Ms MuHildenbrandas attended our [facility] classes and always has information to share with the group. She works hard to keep up with new information on Sleep Apnea and Asthma.       Improve shortness of breath with ADL's   Comments Ms MuRayfieldtays very activie with involvement in several clubs and does activities around the house with less shortness of breath and more stamina.       Breathing Techniques   Comments DiArtiaaid she thinks she is a more efficient breather now and is using breathing techniques to conserve her energy and to distract her from joint pain she feels elsewhere. She uses breathing techniques to help her focus.        Diabetes   Goal --  No known problems with blood glucose levels.       Hypertension   Goal --  Maintained acceptable BP's with rest and exercise.          Personal Goals Discharge (Final Personal Goals and Risk Factors Review):      Goals and Risk Factor Review - 04/03/15 1533    Weight Management   Comments Has not seen progress in this area since starting the program, however, her arthritis makes it difficult to do a lot of physical activity and some days she can do nothing because of the pain.    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 She admits she has good days and bad days as far as pain. She has worked her way back to 2 157msets on the TM and 45m44mon the BX. She plans to continue her exercise on her stationary   bike at h ome and is purchasing a TM for home as well.   Understand more about Heart/Pulmonary Disease   Comments Ms MullDebell attended our [facility] classes and always has information to share with the group. She works hard to keep up with new information on Sleep Apnea and Asthma.   Improve shortness of breath with ADL's   Comments Ms MullNeroys very activie with involvement in several clubs and does activities around the house with less shortness of breath and more stamina.   Breathing Techniques   Comments DianSymphanied she thinks she is a more efficient breather now and is using breathing techniques to conserve her energy and to distract her from joint pain she feels elsewhere. She uses breathing  techniques to help her focus.    Diabetes   Goal --  No known problems with blood glucose levels.   Hypertension   Goal --  Maintained acceptable BP's with rest and exercise.      Comments:

## 2015-04-03 NOTE — Progress Notes (Signed)
Pulmonary Individual Treatment Plan  Patient Details  Name: Mary Cantrell MRN: [id number] Date of Birth: August 14, 1945 Referring Provider:  Erby Pian, MD  Initial Encounter Date:    Visit Diagnosis: Sleep apnea  Patient's Home Medications on Admission:  Current outpatient prescriptions:  .  albuterol (VENTOLIN HFA) 108 (90 BASE) MCG/ACT inhaler, Inhale 2 puffs into the lungs every 4 (four) hours as needed for wheezing (takes every am and then as needed.). , Disp: , Rfl:  .  aspirin 81 MG tablet, Take 81 mg by mouth every morning. , Disp: , Rfl:  .  budesonide-formoterol (SYMBICORT) 160-4.5 MCG/ACT inhaler, Inhale 2 puffs into the lungs every morning. , Disp: , Rfl:  .  Cinnamon 500 MG capsule, Take 500 mg by mouth every morning. , Disp: , Rfl:  .  cyanocobalamin 100 MCG tablet, Take 100 mcg by mouth every morning. , Disp: , Rfl:  .  diphenhydrAMINE (BENADRYL) 25 MG tablet, Take 25 mg by mouth at bedtime as needed (nose gets clogged up with CPAP machine). , Disp: , Rfl:  .  fluticasone (FLONASE) 50 MCG/ACT nasal spray, Place 2 sprays into both nostrils every morning. , Disp: , Rfl:  .  gabapentin (NEURONTIN) 100 MG capsule, Take 1 capsule (100 mg total) by mouth 3 (three) times daily. (Patient taking differently: Take 100 mg by mouth at bedtime. 2 capsules at bedtime), Disp: 180 capsule, Rfl: 4 .  glipiZIDE (GLIPIZIDE XL) 5 MG 24 hr tablet, Take 1 tablet (5 mg total) by mouth daily with breakfast., Disp: 90 tablet, Rfl: 0 .  glucose blood (ONE TOUCH ULTRA TEST) test strip, 1 each by Other route 2 (two) times daily. Use as instructed, Disp: 100 each, Rfl: 2 .  HYDROcodone-acetaminophen (NORCO) 5-325 MG per tablet, Take 1 tablet by mouth every 6 (six) hours as needed for moderate pain., Disp: 30 tablet, Rfl: 0 .  loratadine (CLARITIN) 10 MG tablet, Take 1 tablet (10 mg total) by mouth daily. (Patient taking differently: Take 10 mg by mouth daily as needed. ), Disp: 60 tablet, Rfl: 0 .   losartan (COZAAR) 50 MG tablet, TAKE 1 TABLET DAILY FOR BLOOD PRESSURE, Disp: 90 tablet, Rfl: 1 .  meloxicam (MOBIC) 7.5 MG tablet, Take by mouth. On hold while taking prednisone, Disp: , Rfl:  .  metFORMIN (GLUCOPHAGE-XR) 500 MG 24 hr tablet, One in am and 2 in pm (Patient taking differently: 500 mg. One in am and 2 in pm), Disp: 90 tablet, Rfl: 2 .  pravastatin (PRAVACHOL) 40 MG tablet, TAKE 1 TABLET EVERY EVENING FOR CHOLESTEROL, Disp: 90 tablet, Rfl: 3 .  predniSONE (DELTASONE) 5 MG tablet, TAKE 1 TABLET (5 MG TOTAL) BY MOUTH 3 (THREE) TIMES DAILY., Disp: 45 tablet, Rfl: 0 .  ranitidine (ZANTAC) 150 MG tablet, Take 1 tablet (150 mg total) by mouth 2 (two) times daily., Disp: 60 tablet, Rfl: 0 .  spironolactone-hydrochlorothiazide (ALDACTAZIDE) 25-25 MG per tablet, TAKE ONE-HALF (1/2) TO ONE TABLET DAILY FOR BLOOD PRESSURE (Patient taking differently: TAKE ONE-HALF (1/2) TO ONE TABLET DAILY FOR BLOOD PRESSURE in am), Disp: 90 tablet, Rfl: 1 .  vitamin C (ASCORBIC ACID) 500 MG tablet, Take 1 tablet by mouth at bedtime. , Disp: , Rfl:   Past Medical History: Past Medical History  Diagnosis Date  . Trochanteric bursitis of right hip   . Irritable bowel syndrome   . Depression   . TMJ (dislocation of temporomandibular joint)   . Regurgitation   . Tear of left rotator  cuff   . SOB (shortness of breath)   . Post-void dribbling   . Maculopathy   . OA (osteoarthritis)     Hips  . Nuclear sclerosis of both eyes   . Asthma     Moderate  . Achilles tendinitis of right lower extremity   . Hypertension   . Hypoxemia   . OSA (obstructive sleep apnea)   . Chronic back pain   . Allergy   . Hyperlipidemia   . NASH (nonalcoholic steatohepatitis)   . Psoriasis   . Gastroparesis     DM    Tobacco Use: History  Smoking status  . Never Smoker   Smokeless tobacco  . Never Used    Labs: Recent Review Flowsheet Data    Labs for ITP Cardiac and Pulmonary Rehab Latest Ref Rng 04/22/2014  08/30/2014   Cholestrol 0 - 200 mg/dL 159 -   LDLCALC - 77 -   HDL 35 - 70 mg/dL 48 -   Trlycerides 40 - 160 mg/dL 170(A) -   Hemoglobin A1c 4.0 - 6.0 % - 7.3(A)         POCT Glucose      12/12/14 1130 12/14/14 1130 12/16/14 1442       POCT Blood Glucose   Pre-Exercise 193 mg/dL       Post-Exercise 117 mg/dL       Pre-Exercise #2  218 mg/dL      Post-Exercise #2  159 mg/dL      Pre-Exercise #3   203 mg/dL     Post-Exercise #3   113 mg/dL        ADL UCSD:     ADL UCSD      12/06/14 1130 02/17/15 0826 03/22/15 1130   ADL UCSD   ADL Phase Entry Mid Exit   SOB Score total 73 31 54   Rest 2 0 0   Walk 2 1 1    Stairs 4 3 3    Bath 3 1 3    Dress 2 2 2    Shop 4 1 4      03/24/15 1525       ADL UCSD   ADL Phase Exit     SOB Score total 54     Rest 0     Walk 1     Stairs 3     Bath 3     Dress 2     Shop 4         Pulmonary Function Assessment:     Pulmonary Function Assessment - 12/06/14 1130    Initial Spirometry Results   FVC% 84 %   FEV1% 88 %   FEV1/FVC Ratio 83.25      Exercise Target Goals:    Exercise Program Goal: Individual exercise prescription set with THRR, safety & activity barriers. Participant demonstrates ability to understand and report RPE using BORG scale, to self-measure pulse accurately, and to acknowledge the importance of the exercise prescription.  Exercise Prescription Goal: Starting with aerobic activity 30 plus minutes a day, 3 days per week for initial exercise prescription. Provide home exercise prescription and guidelines that participant acknowledges understanding prior to discharge.  Activity Barriers & Risk Stratification:     Activity Barriers & Risk Stratification - 12/06/14 1130    Activity Barriers & Risk Stratification   Activity Barriers Shortness of Breath;Deconditioning;Balance Concerns   Risk Stratification Moderate      6 Minute Walk:     6 Minute Walk  12/06/14 1240 03/24/15 1236     6 Minute  Walk   Phase Initial Discharge    Distance 1320 feet 1150 feet    Walk Time 6 minutes 6 minutes    Resting HR 78 bpm 94 bpm    Resting BP 118/74 mmHg 122/68 mmHg    Max Ex. HR 84 bpm 98 bpm    Max Ex. BP 132/84 mmHg 140/68 mmHg    RPE 13 12    Perceived Dyspnea  3 3    Symptoms No Yes (comment)    Comments  Hip pain and fibromyalgia pain        Initial Exercise Prescription:     Initial Exercise Prescription - 12/06/14 1700    Date of Initial Exercise Prescription   Date 12/06/14   Treadmill   MPH 1.5   Grade 0   Minutes 10   Recumbant Bike   Level 1   RPM 40   Watts 20   Minutes 10   NuStep   Level 4   Watts 40   Minutes 10   Arm Ergometer   Level 1   Watts 10   Minutes 10   Arm/Foot Ergometer   Level 1   Watts 12   Minutes 10   Recumbant Elliptical   Level 1   RPM 40   Watts 40   Minutes 10   Elliptical   Level 1   Speed 3   Minutes 1   REL-XR   Level 1   Watts 40   Minutes 10   Prescription Details   Frequency (times per week) 3   Duration Progress to 30 minutes of continuous aerobic without signs/symptoms of physical distress   Intensity   THRR REST +  30   Ratings of Perceived Exertion 11-15   Perceived Dyspnea 2-4   Progression Continue progressive overload as per policy without signs/symptoms or physical distress.   Resistance Training   Training Prescription Yes   Weight 1   Reps 10-12      Exercise Prescription Changes:     Exercise Prescription Changes      12/12/14 1400 12/14/14 1400 12/19/14 1200 01/03/15 0800 01/04/15 1400   Exercise Review   Progression   Yes Yes Yes   Response to Exercise   Blood Pressure (Admit) 140/84 mmHg 140/84 mmHg 140/84 mmHg 130/82 mmHg  Entry for 01/02/2015 120/64 mmHg   Blood Pressure (Exercise) 148/80 mmHg 148/80 mmHg 148/80 mmHg 140/80 mmHg 140/70 mmHg   Blood Pressure (Exit) 130/76 mmHg 130/76 mmHg 130/76 mmHg 122/80 mmHg 124/80 mmHg   Heart Rate (Admit) 87 bpm 87 bpm 87 bpm 82 bpm 64 bpm    Heart Rate (Exercise) 92 bpm 92 bpm 92 bpm 96 bpm 99 bpm   Heart Rate (Exit) 76 bpm 76 bpm 76 bpm 80 bpm 85 bpm   Oxygen Saturation (Admit) 92 % 92 % 92 % 95 % 92 %   Oxygen Saturation (Exercise) 95 % 95 % 95 % 95 % 94 %   Oxygen Saturation (Exit) 92 % 92 % 92 % 95 % 95 %   Rating of Perceived Exertion (Exercise) 12 12 12 12 12    Perceived Dyspnea (Exercise) 3 3 3 2 4    Resistance Training   Training Prescription Yes Yes Yes Yes Yes   Weight 1 1 1 1 1    Reps 10-12 10-12 10-12 10-12 10-12   Treadmill   MPH 1.5 1.5 1.5 1.7 1.9   Grade 0 0  0 0 0   Minutes 10 10 15 15 15    Recumbant Bike   Level  1 1 1 3    RPM  40 40 40 50   Watts     15   Minutes  15  NuStep bothered Ms Cacho's right heal, new goal RB 15  NuStep bothered Ms Hewes's right heal, new goal RB 15  NuStep bothered Ms Rockefeller's right heal, new goal RB    NuStep   Level 2 2 2 2     Watts 40 40 40 40    Minutes 15 15 15 15     REL-XR   Level 2 2 2 3 3    Watts 40 40 40 50 50   Minutes 10 10 10 15 10      01/06/15 1200 01/09/15 1500 01/23/15 1300 02/03/15 1100 02/06/15 1500   Exercise Review   Progression Yes Yes Yes Yes Yes   Response to Exercise   Blood Pressure (Admit) 120/64 mmHg 120/64 mmHg 120/64 mmHg 120/64 mmHg 128/70 mmHg   Blood Pressure (Exercise) 140/70 mmHg 140/70 mmHg 140/70 mmHg 140/70 mmHg 150/90 mmHg   Blood Pressure (Exit) 124/80 mmHg 124/80 mmHg 124/80 mmHg 124/80 mmHg 122/62 mmHg   Heart Rate (Admit) 64 bpm 64 bpm 64 bpm 64 bpm 89 bpm   Heart Rate (Exercise) 99 bpm 99 bpm 99 bpm 99 bpm 84 bpm   Heart Rate (Exit) 85 bpm 85 bpm 85 bpm 85 bpm 77 bpm   Oxygen Saturation (Admit) 92 % 92 % 92 % 92 % 94 %   Oxygen Saturation (Exercise) 94 % 94 % 94 % 94 % 97 %   Oxygen Saturation (Exit) 95 % 95 % 95 % 95 % 96 %   Rating of Perceived Exertion (Exercise) 12 12 12 12 15    Perceived Dyspnea (Exercise) 4 4 4 4 3    Resistance Training   Training Prescription Yes Yes Yes Yes Yes   Weight 1 1 1 1 1    Reps 10-12  10-12 10-12 10-12 10-12   Treadmill   MPH 1.9 2.2 2.2 2.2 2.2   Grade 0 0 1 1 0   Minutes 15 15 15 15 15    Recumbant Bike   Level 3    3   RPM 50    50   Watts 15       Minutes     15   NuStep   Level  3 3 5 5    Watts  50 50 60 60   Minutes  15 15 15 15    REL-XR   Level 3 3 3 3 3    Watts 60 60 60 60 60   Minutes 15 15 15 15 15      03/03/15 1200 03/06/15 1500 03/24/15 1200 03/27/15 1200 03/31/15 1400   Exercise Review   Progression Yes Yes Yes Yes    Response to Exercise   Blood Pressure (Admit) 128/70 mmHg 112/60 mmHg  118/64 mmHg    Blood Pressure (Exercise) 150/90 mmHg 138/70 mmHg  128/70 mmHg    Blood Pressure (Exit) 122/62 mmHg 120/70 mmHg  100/66 mmHg    Heart Rate (Admit) 89 bpm 93 bpm  76 bpm    Heart Rate (Exercise) 84 bpm 98 bpm  101 bpm    Heart Rate (Exit) 77 bpm 84 bpm  73 bpm    Oxygen Saturation (Admit) 94 % 94 %  95 %    Oxygen Saturation (Exercise) 97 % 65 %  93 %  Oxygen Saturation (Exit) 96 % 94 %  92 %    Rating of Perceived Exertion (Exercise) 15 13  12     Perceived Dyspnea (Exercise) 3 4  3     Comments   Innocence did well with her post 6MW test today considering all the health issues she has been having. Her distance was less than at intake and we discussed reasons this may be (surgeries she has had since then, pain, etc.). She did give a full effrot and was commended for it. Her treadmill time has been pushed back due to pain and symptoms she has been having and we discussed slowly progressing back to where she was.  Cia did well with her post 6MW test today considering all the health issues she has been having. Her distance was less than at intake and we discussed reasons this may be (surgeries she has had since then, pain, etc.). She did give a full effrot and was commended for it. Her treadmill time has been pushed back due to pain and symptoms she has been having and we discussed slowly progressing back to where she was.  Ms. Newburn was instructed by her  doctor to decrease her prednisone from 56m to 12.566m  Duration Progress to 50 minutes of aerobic without signs/symptoms of physical distress Progress to 50 minutes of aerobic without signs/symptoms of physical distress Progress to 50 minutes of aerobic without signs/symptoms of physical distress Progress to 50 minutes of aerobic without signs/symptoms of physical distress    Intensity THRR unchanged THRR unchanged THRR unchanged THRR unchanged    Progression Continue progressive overload as per policy without signs/symptoms or physical distress. Continue progressive overload as per policy without signs/symptoms or physical distress. Continue progressive overload as per policy without signs/symptoms or physical distress. Continue progressive overload as per policy without signs/symptoms or physical distress.    Resistance Training   Training Prescription Yes Yes Yes Yes    Weight 1 1 1 1     Reps 10-12 10-12 10-12 10-12    Treadmill   MPH 2.2  Doing 10 minute intervals x 4 1.9  Doing 10 minute intervals x 2 1.9  Doing 10 minute intervals x 2 2.3  2 sets of 1532m    Grade 0 0 0 0    Minutes 40 40 14 15    Recumbant Bike   Level    2.5    RPM    40    Minutes    15    REL-XR   Level  3 3 3     Watts  28 28 28     Minutes  11 11 11     Home Exercise Plan   Plans to continue exercise at    [employer] - stationary bike; plans to purchase TM      04/03/15 1200           Exercise Review   Progression Yes       Response to Exercise   Comments Ms. Balon was instructed by her doctor to decrease her prednisone from 41m69m 12.5mg 48m   Recumbant Bike   Level 3       Minutes 15          Discharge Exercise Prescription (Final Exercise Prescription Changes):     Exercise Prescription Changes - 04/03/15 1200    Exercise Review   Progression Yes   Response to Exercise   Comments Ms. MulleOhminstructed by her  doctor to decrease her prednisone from 55m to 12.585m   Recumbant Bike   Level 3   Minutes 15       Nutrition:  Target Goals: Understanding of nutrition guidelines, daily intake of sodium <15002mcholesterol <200m65malories 30% from fat and 7% or less from saturated fats, daily to have 5 or more servings of fruits and vegetables.  Biometrics:      Post Biometrics - 03/24/15 1239     Post  Biometrics   Height 5' 6"  (1.676 m)   Weight 201 lb 11.2 oz (91.491 kg)   Waist Circumference 42.5 inches   Hip Circumference 43.5 inches   Waist to Hip Ratio 0.98 %   BMI (Calculated) 32.6      Nutrition Therapy Plan and Nutrition Goals:     Nutrition Therapy & Goals - 12/06/14 1130    Nutrition Therapy   Diet Ms MullCulbertsonfers not to meet with the dietitian; she states she known what to do; she eats out mostly, but does eat only half and takes the rest to go home; she would like to loss 20lbs      Nutrition Discharge: Rate Your Plate Scores:   Psychosocial: Target Goals: Acknowledge presence or absence of depression, maximize coping skills, provide positive support system. Participant is able to verbalize types and ability to use techniques and skills needed for reducing stress and depression.  Initial Review & Psychosocial Screening:     Initial Psych Review & Screening - 12/06/14 1130    Family Dynamics   Good Support System? Yes   Comments Her husband and 2 children are very supportive; her sister lives close and is supportive.   Barriers   Psychosocial barriers to participate in program There are no identifiable barriers or psychosocial needs.;The patient should benefit from training in stress management and relaxation.   Screening Interventions   Interventions Encouraged to exercise;Program counselor consult      Quality of Life Scores:     Quality of Life - 03/22/15 1130    Quality of Life Scores   Health/Function Post 9.13 %   Health/Function % Change -45.01 %   Socioeconomic Post 11.93 %   Socioeconomic % Change  -50.15 %   Psych/Spiritual Post 16.29 %   Psych/Spiritual % Change -32.14 %   Family Post 0 %   Family % Change 100 %   GLOBAL Post 9.81 %   GLOBAL % Change -52.84 %      PHQ-9:     Recent Review Flowsheet Data    Depression screen PHQ [facility] 03/24/2015 03/22/2015 02/20/2015 12/21/2014 12/06/2014   Decreased Interest 0 0 0 0 0   Down, Depressed, Hopeless 0 0 0 0 0   PHQ - 2 Score 0 0 0 0 0      Psychosocial Evaluation and Intervention:     Psychosocial Evaluation - 04/03/15 1153    Discharge Psychosocial Assessment & Intervention   Comments Counselor met with Ms. Madewell for discharge evaluation.  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  Since that Diagnosis, she reports feeling like she had to "start over" in this program.  Her stamina has improved and her breathing is "okay" with two recent episodes of shortness of breath that were "bad."  Ms. MullMaioloorts she is on medication which is helping with the symptoms.  Her appetite and sleep are fine currently and she has maintained a positive mood through it all.  She plans for follow up to exercise with home equipment.  Ms. Simerly has been a pleasure to work with in this program.        Psychosocial Re-Evaluation:     Psychosocial Re-Evaluation      01/04/15 1037           Psychosocial Re-Evaluation   Comments Counselor met with Ms. Kil today for a follow up.  She reports being stronger and having more energy to garden and do those things that she enjoys since beginning this program.  She is working hard on her goals and it appears to be paying off for Ms. Gunkel.           Education: Education Goals: Education classes will be provided on a weekly basis, covering required topics. Participant will state understanding/return demonstration of topics presented.  Learning Barriers/Preferences:     Learning Barriers/Preferences - 12/06/14 1130    Learning  Barriers/Preferences   Learning Barriers None   Learning Preferences Group Instruction;Individual Instruction;Pictoral;Skilled Demonstration;Verbal Instruction;Video;Written Material      Education Topics: Initial Evaluation Education: - Verbal, written and demonstration of respiratory meds, RPE/PD scales, oximetry and breathing techniques. Instruction on use of nebulizers and MDIs: cleaning and proper use, rinsing mouth with steroid doses and importance of monitoring MDI activations.          Pulmonary Rehab from 03/15/2015 in [place]   Date  12/06/14   Educator  LB   Instruction Review Code  2- meets goals/outcomes      General Nutrition Guidelines/Fats and Fiber: -Group instruction provided by verbal, written material, models and posters to present the general guidelines for heart healthy nutrition. Gives an explanation and review of dietary fats and fiber.      Pulmonary Rehab from 03/15/2015 in [place]   Date  02/06/15   Educator  Karolee Stamps, RD   Instruction Review Code  2- meets goals/outcomes      Controlling Sodium/Reading Food Labels: -Group verbal and written material supporting the discussion of sodium use in heart healthy nutrition. Review and explanation with models, verbal and written materials for utilization of the food label.   Exercise Physiology & Risk Factors: - Group verbal and written instruction with models to review the exercise physiology of the cardiovascular system and associated critical values. Details cardiovascular disease risk factors and the goals associated with each risk factor.      Pulmonary Rehab from 03/15/2015 in [place]   Date  03/01/15   Educator  SW   Instruction Review Code  2- meets goals/outcomes      Aerobic Exercise & Resistance Training: - Gives group verbal and written discussion on the health impact of  inactivity. On the components of aerobic and resistive training programs and the benefits of this training and how to safely progress through these programs.      Pulmonary Rehab from 03/15/2015 in [place]   Date  12/28/14   Educator  SW   Instruction Review Code  2- meets goals/outcomes      Flexibility, Balance, General Exercise Guidelines: - Provides group verbal and written instruction on the benefits of flexibility and balance training programs. Provides general exercise guidelines with specific guidelines to those with heart or lung disease. Demonstration and skill practice provided.      Pulmonary Rehab from 03/15/2015 in [place]   Date  01/25/15  Educator  SW   Instruction Review Code  2- meets goals/outcomes      Stress Management: - Provides group verbal and written instruction about the health risks of elevated stress, cause of high stress, and healthy ways to reduce stress.   Depression: - Provides group verbal and written instruction on the correlation between heart/lung disease and depressed mood, treatment options, and the stigmas associated with seeking treatment.      Pulmonary Rehab from 03/15/2015 in [place]   Date  03/15/15   Educator  [facility]   Instruction Review Code  2- meets goals/outcomes      Exercise & Equipment Safety: - Individual verbal instruction and demonstration of equipment use and safety with use of the equipment.      Pulmonary Rehab from 03/15/2015 in [place]   Date  12/12/14   Educator  L. Owens Shark, RT   Instruction Review Code  2- meets goals/outcomes      Infection Prevention: - Provides verbal and written material to individual with discussion of infection control including proper hand washing and proper equipment cleaning during exercise session.      Pulmonary Rehab from 03/15/2015  in [place]   Date  12/12/14   Educator  LB   Instruction Review Code  2- meets goals/outcomes      Falls Prevention: - Provides verbal and written material to individual with discussion of falls prevention and safety.   Diabetes: - Individual verbal and written instruction to review signs/symptoms of diabetes, desired ranges of glucose level fasting, after meals and with exercise. Advice that pre and post exercise glucose checks will be done for 3 sessions at entry of program.   Chronic Lung Diseases: - Group verbal and written instruction to review new updates, new respiratory medications, new advancements in procedures and treatments. Provide informative websites and "800" numbers of self-education.      Pulmonary Rehab from 03/15/2015 in [place]   Date  01/30/15   Educator  Carson Myrtle, RRT   Instruction Review Code  2- meets goals/outcomes      Lung Procedures: - Group verbal and written instruction to describe testing methods done to diagnose lung disease. Review the outcome of test results. Describe the treatment choices: Pulmonary Function Tests, ABGs and oximetry.      Pulmonary Rehab from 03/15/2015 in [place]   Date  01/27/15   Educator  [place]   Instruction Review Code  2- meets goals/outcomes      Energy Conservation: - Provide group verbal and written instruction for methods to conserve energy, plan and organize activities. Instruct on pacing techniques, use of adaptive equipment and posture/positioning to relieve shortness of breath.      Pulmonary Rehab from 03/15/2015 in [place]   Date  02/08/15   Educator  SW   Instruction Review Code  2- meets goals/outcomes      Triggers: - Group verbal and written instruction to review types of environmental controls: home humidity, furnaces, filters, dust mite/pet  prevention, HEPA vacuums. To discuss weather changes, air quality and the benefits of nasal washing.      Pulmonary Rehab from 03/15/2015 in [place]   Date  01/02/15   Educator  LB   Instruction Review Code  2- meets goals/outcomes      Exacerbations: - Group verbal and  written instruction to provide: warning signs, infection symptoms, calling MD promptly, preventive modes, and value of vaccinations. Review: effective airway clearance, coughing and/or vibration techniques. Create an Sports administrator.   Oxygen: - Individual and group verbal and written instruction on oxygen therapy. Includes supplement oxygen, available portable oxygen systems, continuous and intermittent flow rates, oxygen safety, concentrators, and Medicare reimbursement for oxygen.   Respiratory Medications: - Group verbal and written instruction to review medications for lung disease. Drug class, frequency, complications, importance of spacers, rinsing mouth after steroid MDI's, and proper cleaning methods for nebulizers.      Pulmonary Rehab from 03/15/2015 in [place]   Date  12/06/14   Educator  LB   Instruction Review Code  2- meets goals/outcomes      AED/CPR: - Group verbal and written instruction with the use of models to demonstrate the basic use of the AED with the basic ABC's of resuscitation.      Pulmonary Rehab from 03/15/2015 in [place]   Date  02/03/15   Educator  CE   Instruction Review Code  2- meets goals/outcomes      Breathing Retraining: - Provides individuals verbal and written instruction on purpose, frequency, and proper technique of diaphragmatic breathing and pursed-lipped breathing. Applies individual practice skills.      Pulmonary Rehab from 03/15/2015 in [place]   Date  12/06/14   Educator  LB   Instruction Review Code  2-  meets goals/outcomes      Anatomy and Physiology of the Lungs: - Group verbal and written instruction with the use of models to provide basic lung anatomy and physiology related to function, structure and complications of lung disease.      Pulmonary Rehab from 03/15/2015 in [place]   Date  03/10/15   Educator  Frederich Cha, RRT   Instruction Review Code  2- meets goals/outcomes      Heart Failure: - Group verbal and written instruction on the basics of heart failure: signs/symptoms, treatments, explanation of ejection fraction, enlarged heart and cardiomyopathy.   Sleep Apnea: - Individual verbal and written instruction to review Obstructive Sleep Apnea. Review of risk factors, methods for diagnosing and types of masks and machines for OSA.   Anxiety: - Provides group, verbal and written instruction on the correlation between heart/lung disease and anxiety, treatment options, and management of anxiety.      Pulmonary Rehab from 03/15/2015 in [place]   Date  02/15/15   Educator  Berle Mull, MSW   Instruction Review Code  2- Meets goals/outcomes      Relaxation: - Provides group, verbal and written instruction about the benefits of relaxation for patients with heart/lung disease. Also provides patients with examples of relaxation techniques.   Knowledge Questionnaire Score:     Knowledge Questionnaire Score - 12/06/14 1130    Knowledge Questionnaire Score   Pre Score -2      Personal Goals and Risk Factors at Admission:     Personal Goals and Risk Factors at Admission - 12/07/14 0835    Personal Goals and Risk Factors on Admission    Weight Management Yes   Intervention Learn and follow the exercise and diet guidelines while in the program. Utilize the nutrition and education classes to help gain knowledge of the diet and exercise expectations in the program   Increase Aerobic Exercise  and Physical  Activity Yes   Intervention While in program, learn and follow the exercise prescription taught. Start at a low level workload and increase workload after able to maintain previous level for 30 minutes. Increase time before increasing intensity.   Understand more about Heart/Pulmonary Disease. Yes   Intervention While in program utilize professionals for any questions, and attend the education sessions. Great websites to use are www.americanheart.org or www.lung.org for reliable information.   Improve shortness of breath with ADL's Yes   Intervention While in program, learn and follow the exercise prescription taught. Start at a low level workload and increase workload ad advised by the exercise physiologist. Increase time before increasing intensity.   Develop more efficient breathing techniques such as purse lipped breathing and diaphragmatic breathing; and practicing self-pacing with activity Yes   Intervention While in program, learn and utilize the specific breathing techniques taught to you. Continue to practice and use the techniques as needed.   Increase knowledge of respiratory medications and ability to use respiratory devices properly.  Yes   Intervention While in program, learn to administer MDI, nebulizer, and spacer properly.;Learn to take respiratory medicine as ordered.;While in program, learn to Clean MDI, nebulizers, and spacers properly.   Diabetes Yes   Goal Blood glucose control identified by blood glucose values, HgbA1C. Participant verbalizes understanding of the signs/symptoms of hyper/hypo glycemia, proper foot care and importance of medication and nutrition plan for blood glucose control.   Intervention Provide nutrition & aerobic exercise along with prescribed medications to achieve blood glucose in normal ranges: Fasting 65-99 mg/dL   Hypertension Yes   Goal Participant will see blood pressure controlled within the values of 140/34m/Hg or within value directed  by their physician.   Intervention Provide nutrition & aerobic exercise along with prescribed medications to achieve BP 140/90 or less.      Personal Goals and Risk Factors Review:      Goals and Risk Factor Review      12/19/14 1130 01/02/15 1130 01/02/15 1233 01/04/15 1000 01/20/15 1130   Increase Aerobic Exercise and Physical Activity   Goals Progress/Improvement seen    Yes  Yes   Comments   wants to purchase a treadmill  [place] walking and stairs from her exercise in [facility].   Understand more about Heart/Pulmonary Disease   Goals Progress/Improvement seen   Yes      Comments  Ms MFeeseris attending our educational sessions and has contributed very helpful information for  the group with some of our educational sessions.      Improve shortness of breath with ADL's   Goals Progress/Improvement seen    Yes     Comments   gardening is easier     Breathing Techniques   Goals Progress/Improvement seen  Yes  Yes     Comments Observed Ms MWickershamperform PLB during exercise on the equipment. She states she uses the technique at home with activities.       Increase knowledge of respiratory medications   Goals Progress/Improvement seen     Yes    Comments    Discussed MDI use of her Symbicort, Albuterol, and Spacer - properly using      01/25/15 1256 02/17/15 1404 03/10/15 1554 03/22/15 1130 03/28/15 0828   Weight Management   Goals Progress/Improvement seen No       Comments Has not seen progress in this area since starting the program,  however, her arthritis makes it difficult to do a lot of physical activity and some days she can do nothing because of the pain.     Has not seen progress in this area since starting the program, however, her arthritis makes it difficult to do a lot of physical activity and some days she can do nothing because of the pain.    Increase Aerobic Exercise and Physical Activity   Goals  Progress/Improvement seen  Yes No   Yes   Comments Is challenged by the exercise in class and has been able to make progressions, however, her arthritis causes her to have to step back sometimes. She added elevation to the treadmill, but is unable to accomplish this goal on bad arthritis days. She has a hottub that she does water exercises in and this has helped the arthritis. She can do gardening, shopping and activities around the house easier now.  Due to Eveleen's joint disease (arteritis) she can no longer do seated exercises because her joints will "lock up". Her doctor told her to do the treadmill in order to loosen the joints. She is now doing all of her exercise time on the treadmill in three intervals of 10-15 minutes.    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 She admits she has good days and bad days as far as pain. She has worked her way back to 2 34mn sets on the TM and 136ms on the BX. She plans to continue her exercise on her stationary  bike at h ome and is purchasing a TM for home as well.   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 [facility] various topics and enjoys learning in general.  DiHaeleyays she is learning a lot on how to manage the combination of chronic diseases that she has. Her doctors are still trying to find the right medication doses and the right plan to address all of her issues.  Lung anatomy education was provided today.  Ms. MuAllas also given a few website addresses that can teach her more about heart and lung disease.  Ms MuChesmoreas attended our [place] and always has information to share with the group. She works hard to keep up with new information on Sleep Apnea and Asthma.    Improve shortness of breath with ADL's   Goals Progress/Improvement seen  Yes   Yes Yes   Comments Can do activities around the house easier and is involved in several clubs. She can manage a more full schedule now that she has more stamina    Ms MuCwiktays very activie with involvement in several clubs and does activities around the house with less shortness of breath and more stamina.   Breathing Techniques   Goals Progress/Improvement seen  Yes Yes      Comments Uses PLB and conserves energy when she feels short of breath, especially when she is outside [place] she thinks she is a more efficient breather now and is using breathing techniques to conserve her energy and to distract her from joint pain she feels elsewhere. She uses breathing techniques to help her focus.    DiDoreaaid she thinks she is a more efficient breather now and  is using breathing techniques to conserve her energy and to distract her from joint pain she feels elsewhere. She uses breathing techniques to help her focus.    Increase knowledge of respiratory medications   Goals Progress/Improvement seen      Yes   Comments     Ms Chrystal has good understanding and use of her MDI's: Symbicort, Albuterol, Spacer. She also has good understanding of her CPAP and is very compliant with use every night.   Diabetes   Goal     --  No problems with glucose levels with exercise class.   Hypertension   Goal     --  Maintaining acceptable levels of BP with rest and exercise.      Personal Goals Discharge (Final Personal Goals and Risk Factors Review):      Goals and Risk Factor Review - 03/28/15 0828    Weight Management   Comments Has not seen progress in this area since starting the program, however, her arthritis makes it difficult to do a lot of physical activity and some days she can do nothing because of the pain.    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 She admits she has good days and bad days as far as pain. She has worked her way back to 2 48mn sets on the TM and 171ms on the BX. She plans to continue her exercise on her stationary  bike at h ome and is purchasing a TM for home as well.   Understand more about Heart/Pulmonary Disease   Goals Progress/Improvement seen  Yes   Comments Ms MuEndeas attended our [facility] classes and always has information to share with the group. She works hard to keep up with new information on Sleep Apnea and Asthma.   Improve shortness of breath with ADL's   Goals Progress/Improvement seen  Yes   Comments Ms MuAispurotays very activie with involvement in several clubs and does activities around the house with less shortness of breath and more stamina.   Breathing Techniques   Comments DiRicaaid she thinks she is a more efficient breather now and is using breathing techniques to conserve her energy and to distract her from joint pain she feels elsewhere. She uses breathing techniques to help her focus.    Increase knowledge of respiratory medications   Goals Progress/Improvement seen  Yes   Comments Ms MuKlapperas good understanding and use of her MDI's: Symbicort, Albuterol, Spacer. She also has good understanding of her CPAP and is very compliant with use every night.   Diabetes   Goal --  No problems with glucose levels with exercise class.   Hypertension   Goal --  Maintaining acceptable levels of BP with rest and exercise.      Comments: 30 day note

## 2015-04-03 NOTE — Patient Instructions (Signed)
pul Discharge Instructions  Patient Details  Name: Mary Cantrell MRN: [id number] Date of Birth: May 22, 1946 Referring Provider:  Dr Wallene Huh   Number of Visits: 36  Reason for Discharge:  Patient reached a stable level of exercise. Patient independent in their exercise.  Smoking History:  History  Smoking status   Never Smoker   Smokeless tobacco   Never Used    Diagnosis:  Asthma, mild intermittent, uncomplicated  OSA (obstructive sleep apnea)  Initial Exercise Prescription:     Initial Exercise Prescription - 12/06/14 1700    Date of Initial Exercise Prescription   Date 12/06/14   Treadmill   MPH 1.5   Grade 0   Minutes 10   Recumbant Bike   Level 1   RPM 40   Watts 20   Minutes 10   NuStep   Level 4   Watts 40   Minutes 10   Arm Ergometer   Level 1   Watts 10   Minutes 10   Arm/Foot Ergometer   Level 1   Watts 12   Minutes 10   Recumbant Elliptical   Level 1   RPM 40   Watts 40   Minutes 10   Elliptical   Level 1   Speed 3   Minutes 1   REL-XR   Level 1   Watts 40   Minutes 10   Prescription Details   Frequency (times per week) 3   Duration Progress to 30 minutes of continuous aerobic without signs/symptoms of physical distress   Intensity   THRR REST +  30   Ratings of Perceived Exertion 11-15   Perceived Dyspnea 2-4   Progression Continue progressive overload as per policy without signs/symptoms or physical distress.   Resistance Training   Training Prescription Yes   Weight 1   Reps 10-12      Discharge Exercise Prescription (Final Exercise Prescription Changes):     Exercise Prescription Changes - 04/03/15 1500    Exercise Review   Progression Yes   Response to Exercise   Blood Pressure (Admit) 120/66 mmHg   Blood Pressure (Exercise) 134/74 mmHg   Blood Pressure (Exit) 112/72 mmHg   Heart Rate (Admit) 107 bpm   Heart Rate (Exercise) 105 bpm   Heart Rate (Exit) 80 bpm   Oxygen Saturation (Admit) 96 %   Oxygen  Saturation (Exercise) 94 %   Oxygen Saturation (Exit) 97 %   Rating of Perceived Exertion (Exercise) 12   Perceived Dyspnea (Exercise) 3   Comments Mary Cantrell was instructed by her doctor to decrease her prednisone from 63m to 12.578m  Frequency Add 2 additional days to program exercise sessions.   Duration Progress to 50 minutes of aerobic without signs/symptoms of physical distress   Intensity Rest + 30   Progression Continue progressive overload as per policy without signs/symptoms or physical distress.   Resistance Training   Training Prescription Yes   Weight 1   Reps 10-12   Treadmill   MPH 30   Grade 2.3   Minutes 0   Recumbant Bike   Level 3   RPM 40   Minutes 15   REL-XR   Level 3   Watts 44   Minutes 15   Home Exercise Plan   [facility]      Functional Capacity:     6 Minute Walk      12/06/14 1240 03/24/15  1236     6 Minute Walk   Phase Initial Discharge    Distance 1320 feet 1150 feet    Walk Time 6 minutes 6 minutes    Resting HR 78 bpm 94 bpm    Resting BP 118/74 mmHg 122/68 mmHg    Max Ex. HR 84 bpm 98 bpm    Max Ex. BP 132/84 mmHg 140/68 mmHg    RPE 13 12    Perceived Dyspnea  3 3    Symptoms No Yes (comment)    Comments  Hip pain and fibromyalgia pain        Quality of Life:     Quality of Life - 03/22/15 1130    Quality of Life Scores   Health/Function Post 9.13 %   Health/Function % Change -45.01 %   Socioeconomic Post 11.93 %   Socioeconomic % Change -50.15 %   Psych/Spiritual Post 16.29 %   Psych/Spiritual % Change -32.14 %   Family Post 0 %   Family % Change 100 %   GLOBAL Post 9.81 %   GLOBAL % Change -52.84 %      Personal Goals: Goals established at orientation with interventions provided to work toward goal.     Personal Goals and Risk Factors at Admission - 12/07/14 0835    Personal Goals and Risk Factors on Admission    Weight Management Yes    Intervention Learn and follow the exercise and diet guidelines while in the program. Utilize the nutrition and education classes to help gain knowledge of the diet and exercise expectations in the program   Increase Aerobic Exercise and Physical Activity Yes   Intervention While in program, learn and follow the exercise prescription taught. Start at a low level workload and increase workload after able to maintain previous level for 30 minutes. Increase time before increasing intensity.   Understand more about Heart/Pulmonary Disease. Yes   Intervention While in program utilize professionals for any questions, and attend the education sessions. Great websites to use are www.americanheart.org or www.lung.org for reliable information.   Improve shortness of breath with ADL's Yes   Intervention While in program, learn and follow the exercise prescription taught. Start at a low level workload and increase workload ad advised by the exercise physiologist. Increase time before increasing intensity.   Develop more efficient breathing techniques such as purse lipped breathing and diaphragmatic breathing; and practicing self-pacing with activity Yes   Intervention While in program, learn and utilize the specific breathing techniques taught to you. Continue to practice and use the techniques as needed.   Increase knowledge of respiratory medications and ability to use respiratory devices properly.  Yes   Intervention While in program, learn to administer MDI, nebulizer, and spacer properly.;Learn to take respiratory medicine as ordered.;While in program, learn to Clean MDI, nebulizers, and spacers properly.   Diabetes Yes   Goal Blood glucose control identified by blood glucose values, HgbA1C. Participant verbalizes understanding of the signs/symptoms of hyper/hypo glycemia, proper foot care and importance of medication and nutrition plan for blood glucose control.   Intervention Provide nutrition & aerobic  exercise along with prescribed medications to achieve blood glucose in normal ranges: Fasting 65-99 mg/dL   Hypertension Yes   Goal Participant will see blood pressure controlled within the values of 140/56m/Hg or within value directed by their physician.   Intervention Provide nutrition & aerobic exercise along with prescribed medications to achieve BP 140/90 or less.       Personal  Goals Discharge:     Goals and Risk Factor Review - 04/03/15 1533    Weight Management   Comments Has not seen progress in this area since starting the program, however, her arthritis makes it difficult to do a lot of physical activity and some days she can do nothing because of the pain.    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 She admits she has good days and bad days as far as pain. She has worked her way back to 2 12mn sets on the TM and 153ms on the BX. She plans to continue her exercise on her stationary  bike at h ome and is purchasing a TM for home as well.   Understand more about Heart/Pulmonary Disease   Comments Mary Cantrell attended our [facility] classes and always has information to share with the group. She works hard to keep up with new information on Sleep Apnea and Asthma.   Improve shortness of breath with ADL's   Comments Mary Cantrell very activie with involvement in several clubs and does activities around the house with less shortness of breath and more stamina.   Breathing Techniques   Comments Mary Cantrell she thinks she is a more efficient breather now and is using breathing techniques to conserve her energy and to distract her from joint pain she feels elsewhere. She uses breathing techniques to help her focus.    Diabetes   Goal --  No known problems with blood glucose levels.   Hypertension   Goal --  Maintained acceptable BP's with rest and exercise.      Nutrition &  Weight - Outcomes:      Post Biometrics - 03/24/15 1239     Post  Biometrics   Height 5' 6"  (1.676 m)   Weight 201 lb 11.2 oz (91.491 kg)   Waist Circumference 42.5 inches   Hip Circumference 43.5 inches   Waist to Hip Ratio 0.98 %   BMI (Calculated) 32.6      Nutrition:     Nutrition Therapy & Goals - 12/06/14 1130    Nutrition Therapy   Diet Mary MuVenorefers not to meet with the dietitian; she states she known what to do; she eats out mostly, but does eat only half and takes the rest to go home; she would like to loss 20lbs      Nutrition Discharge:   Education Questionnaire Score:     Knowledge Questionnaire Score - 12/06/14 1130    Knowledge Questionnaire Score   Pre Score -2      Goals reviewed with patient; copy given to patient.

## 2015-04-03 NOTE — Progress Notes (Signed)
Daily Session Note  Patient Details  Name: Mary Cantrell MRN: [id number] Date of Birth: [birth year] Referring Provider:  Erby Pian, MD  Encounter Date: 04/03/2015  Check In:     Session Check In - 04/03/15 1229    Check-In   Staff Present Carson Myrtle BS, RRT, Respiratory Therapist;Alezandra Egli RN, BSN;Steven Way BS, ACSM EP-C, Exercise Physiologist   ER physicians immediately available to respond to emergencies [facility] immediately available ER MD   Physician(s) Dr. Clearnce Hasten and Dr. Corky Downs   Medication changes reported     No   Fall or balance concerns reported    No   Warm-up and Cool-down Performed on first and last piece of equipment   VAD Patient? No   VAD patient   Has back up controller? No   Pain Assessment   Currently in Pain? No/denies           Exercise Prescription Changes - 04/03/15 1200    Exercise Review   Progression Yes   Response to Exercise   Comments Mary Cantrell was instructed by her doctor to decrease her prednisone from 26m to 12.53m  Recumbant Bike   Level 3   Minutes 15      Goals Met:  Proper associated with RPD/PD & O2 Sat Exercise tolerated well  Goals Unmet:  Not Applicable  Goals Comments: 30 day note   Dr. MaEmily Filberts Medical Director for [facility].

## 2015-04-07 ENCOUNTER — Encounter: Payer: Medicare Other | Admitting: *Deleted

## 2015-04-07 DIAGNOSIS — J4520 Mild intermittent asthma, uncomplicated: Secondary | ICD-10-CM | POA: Diagnosis not present

## 2015-04-07 DIAGNOSIS — G4733 Obstructive sleep apnea (adult) (pediatric): Secondary | ICD-10-CM

## 2015-04-07 NOTE — Progress Notes (Signed)
Pulmonary Individual Treatment Plan  Patient Details  Name: Mary Cantrell MRN: [id number] Date of Birth: [birth year] Referring Provider:  Steele Sizer, MD  Initial Encounter Date: 12/06/2014   Visit Diagnosis: Asthma, mild intermittent, uncomplicated  OSA (obstructive sleep apnea)  Patient's Home Medications on Admission:  Current outpatient prescriptions:  .  albuterol (VENTOLIN HFA) 108 (90 BASE) MCG/ACT inhaler, Inhale 2 puffs into the lungs every 4 (four) hours as needed for wheezing (takes every am and then as needed.). , Disp: , Rfl:  .  aspirin 81 MG tablet, Take 81 mg by mouth every morning. , Disp: , Rfl:  .  budesonide-formoterol (SYMBICORT) 160-4.5 MCG/ACT inhaler, Inhale 2 puffs into the lungs every morning. , Disp: , Rfl:  .  Cinnamon 500 MG capsule, Take 500 mg by mouth every morning. , Disp: , Rfl:  .  cyanocobalamin 100 MCG tablet, Take 100 mcg by mouth every morning. , Disp: , Rfl:  .  diphenhydrAMINE (BENADRYL) 25 MG tablet, Take 25 mg by mouth at bedtime as needed (nose gets clogged up with CPAP machine). , Disp: , Rfl:  .  fluticasone (FLONASE) 50 MCG/ACT nasal spray, Place 2 sprays into both nostrils every morning. , Disp: , Rfl:  .  gabapentin (NEURONTIN) 100 MG capsule, Take 1 capsule (100 mg total) by mouth 3 (three) times daily. (Patient taking differently: Take 100 mg by mouth at bedtime. 2 capsules at bedtime), Disp: 180 capsule, Rfl: 4 .  glipiZIDE (GLIPIZIDE XL) 5 MG 24 hr tablet, Take 1 tablet (5 mg total) by mouth daily with breakfast., Disp: 90 tablet, Rfl: 0 .  glucose blood (ONE TOUCH ULTRA TEST) test strip, 1 each by Other route 2 (two) times daily. Use as instructed, Disp: 100 each, Rfl: 2 .  HYDROcodone-acetaminophen (NORCO) 5-325 MG per tablet, Take 1 tablet by mouth every 6 (six) hours as needed for moderate pain., Disp: 30 tablet, Rfl: 0 .  loratadine (CLARITIN) 10 MG tablet, Take 1 tablet (10 mg total) by mouth daily. (Patient taking differently: Take  10 mg by mouth daily as needed. ), Disp: 60 tablet, Rfl: 0 .  losartan (COZAAR) 50 MG tablet, TAKE 1 TABLET DAILY FOR BLOOD PRESSURE, Disp: 90 tablet, Rfl: 1 .  meloxicam (MOBIC) 7.5 MG tablet, Take by mouth. On hold while taking prednisone, Disp: , Rfl:  .  metFORMIN (GLUCOPHAGE-XR) 500 MG 24 hr tablet, One in am and 2 in pm (Patient taking differently: 500 mg. One in am and 2 in pm), Disp: 90 tablet, Rfl: 2 .  pravastatin (PRAVACHOL) 40 MG tablet, TAKE 1 TABLET EVERY EVENING FOR CHOLESTEROL, Disp: 90 tablet, Rfl: 3 .  predniSONE (DELTASONE) 5 MG tablet, TAKE 1 TABLET (5 MG TOTAL) BY MOUTH 3 (THREE) TIMES DAILY., Disp: 45 tablet, Rfl: 0 .  ranitidine (ZANTAC) 150 MG tablet, Take 1 tablet (150 mg total) by mouth 2 (two) times daily., Disp: 60 tablet, Rfl: 0 .  spironolactone-hydrochlorothiazide (ALDACTAZIDE) 25-25 MG per tablet, TAKE ONE-HALF (1/2) TO ONE TABLET DAILY FOR BLOOD PRESSURE (Patient taking differently: TAKE ONE-HALF (1/2) TO ONE TABLET DAILY FOR BLOOD PRESSURE in am), Disp: 90 tablet, Rfl: 1 .  vitamin C (ASCORBIC ACID) 500 MG tablet, Take 1 tablet by mouth at bedtime. , Disp: , Rfl:   Past Medical History: Past Medical History  Diagnosis Date  . Trochanteric bursitis of right hip   . Irritable bowel syndrome   . Depression   . TMJ (dislocation of temporomandibular joint)   . Regurgitation   .  Tear of left rotator cuff   . SOB (shortness of breath)   . Post-void dribbling   . Maculopathy   . OA (osteoarthritis)     Hips  . Nuclear sclerosis of both eyes   . Asthma     Moderate  . Achilles tendinitis of right lower extremity   . Hypertension   . Hypoxemia   . OSA (obstructive sleep apnea)   . Chronic back pain   . Allergy   . Hyperlipidemia   . NASH (nonalcoholic steatohepatitis)   . Psoriasis   . Gastroparesis     DM    Tobacco Use: History  Smoking status  . Never Smoker   Smokeless tobacco  . Never Used    Labs: Recent Review Flowsheet Data    Labs for  ITP Cardiac and Pulmonary Rehab Latest Ref Rng 04/22/2014 08/30/2014   Cholestrol 0 - 200 mg/dL 159 -   LDLCALC - 77 -   HDL 35 - 70 mg/dL 48 -   Trlycerides 40 - 160 mg/dL 170(A) -   Hemoglobin A1c 4.0 - 6.0 % - 7.3(A)         POCT Glucose      12/12/14 1130 12/14/14 1130 12/16/14 1442       POCT Blood Glucose   Pre-Exercise 193 mg/dL       Post-Exercise 117 mg/dL       Pre-Exercise #2  218 mg/dL      Post-Exercise #2  159 mg/dL      Pre-Exercise #3   203 mg/dL     Post-Exercise #3   113 mg/dL        ADL UCSD:     ADL UCSD      12/06/14 1130 02/17/15 0826 03/22/15 1130   ADL UCSD   ADL Phase Entry Mid Exit   SOB Score total 73 31 54   Rest 2 0 0   Walk _0 Stairs _1 Bath _2 Dress _3 Shop _4 03/24/15 1525       ADL UCSD   ADL Phase Exit     SOB Score total 54     Rest 0     Walk 1     Stairs 3     Bath 3     Dress 2     Shop 4         Pulmonary Function Assessment:     Pulmonary Function Assessment - 12/06/14 1130    Initial Spirometry Results   FVC% 84 %   FEV1% 88 %   FEV1/FVC Ratio 83.25      Exercise Target Goals:    Exercise Program Goal: Individual exercise prescription set with THRR, safety & activity barriers. Participant demonstrates ability to understand and report RPE using BORG scale, to self-measure pulse accurately, and to acknowledge the importance of the exercise prescription.  Exercise Prescription Goal: Starting with aerobic activity 30 plus minutes a day, 3 days per week for initial exercise prescription. Provide home exercise prescription and guidelines that participant acknowledges understanding prior to discharge.  Activity Barriers & Risk Stratification:     Activity Barriers & Risk Stratification - 12/06/14 1130    Activity Barriers & Risk Stratification   Activity Barriers Shortness of Breath;Deconditioning;Balance Concerns   Risk Stratification Moderate      6 Minute Walk:     6  Minute Walk  12/06/14 1240 03/24/15 1236     6 Minute Walk   Phase Initial Discharge    Distance 1320 feet 1150 feet    Walk Time 6 minutes 6 minutes    Resting HR 78 bpm 94 bpm    Resting BP 118/74 mmHg 122/68 mmHg    Max Ex. HR 84 bpm 98 bpm    Max Ex. BP 132/84 mmHg 140/68 mmHg    RPE 13 12    Perceived Dyspnea  3 3    Symptoms No Yes (comment)    Comments  Hip pain and fibromyalgia pain        Initial Exercise Prescription:     Initial Exercise Prescription - 12/06/14 1700    Date of Initial Exercise Prescription   Date 12/06/14   Treadmill   MPH 1.5   Grade 0   Minutes 10   Recumbant Bike   Level 1   RPM 40   Watts 20   Minutes 10   NuStep   Level 4   Watts 40   Minutes 10   Arm Ergometer   Level 1   Watts 10   Minutes 10   Arm/Foot Ergometer   Level 1   Watts 12   Minutes 10   Recumbant Elliptical   Level 1   RPM 40   Watts 40   Minutes 10   Elliptical   Level 1   Speed 3   Minutes 1   REL-XR   Level 1   Watts 40   Minutes 10   Prescription Details   Frequency (times per week) 3   Duration Progress to 30 minutes of continuous aerobic without signs/symptoms of physical distress   Intensity   THRR REST +  30   Ratings of Perceived Exertion 11-15   Perceived Dyspnea 2-4   Progression Continue progressive overload as per policy without signs/symptoms or physical distress.   Resistance Training   Training Prescription Yes   Weight 1   Reps 10-12      Exercise Prescription Changes:     Exercise Prescription Changes      12/12/14 1400 12/14/14 1400 12/19/14 1200 01/03/15 0800 01/04/15 1400   Exercise Review   Progression   Yes Yes Yes   Response to Exercise   Blood Pressure (Admit) 140/84 mmHg 140/84 mmHg 140/84 mmHg 130/82 mmHg  Entry for 01/02/2015 120/64 mmHg   Blood Pressure (Exercise) 148/80 mmHg 148/80 mmHg 148/80 mmHg 140/80 mmHg 140/70 mmHg   Blood Pressure (Exit) 130/76 mmHg 130/76 mmHg 130/76 mmHg 122/80 mmHg 124/80 mmHg    Heart Rate (Admit) 87 bpm 87 bpm 87 bpm 82 bpm 64 bpm   Heart Rate (Exercise) 92 bpm 92 bpm 92 bpm 96 bpm 99 bpm   Heart Rate (Exit) 76 bpm 76 bpm 76 bpm 80 bpm 85 bpm   Oxygen Saturation (Admit) 92 % 92 % 92 % 95 % 92 %   Oxygen Saturation (Exercise) 95 % 95 % 95 % 95 % 94 %   Oxygen Saturation (Exit) 92 % 92 % 92 % 95 % 95 %   Rating of Perceived Exertion (Exercise) _0 Perceived Dyspnea (Exercise) _1 Resistance Training   Training Prescription _2    Weight _3 Reps 10-12 10-12 10-12 10-12 10-12   Treadmill   MPH 1.5 1.5 1.5 1.7 1.9   Grade 0 0 0  0 0   Minutes _0 Recumbant Bike   Level  _1 RPM  40 40 40 50   Watts     15   Minutes  15  NuStep bothered Ms Falzon's right heal, new goal RB 15  NuStep bothered Ms Klier's right heal, new goal RB 15  NuStep bothered Ms Ragon's right heal, new goal RB    NuStep   Level _2 Watts 40 40 40 40    Minutes _3 REL-XR   Level _4 Watts 40 40 40 50 50   Minutes _5 01/06/15 1200 01/09/15 1500 01/23/15 1300 02/03/15 1100 02/06/15 1500   Exercise Review   Progression _6    Response to Exercise   Blood Pressure (Admit) 120/64 mmHg 120/64 mmHg 120/64 mmHg 120/64 mmHg 128/70 mmHg   Blood Pressure (Exercise) 140/70 mmHg 140/70 mmHg 140/70 mmHg 140/70 mmHg 150/90 mmHg   Blood Pressure (Exit) 124/80 mmHg 124/80 mmHg 124/80 mmHg 124/80 mmHg 122/62 mmHg   Heart Rate (Admit) 64 bpm 64 bpm 64 bpm 64 bpm 89 bpm   Heart Rate (Exercise) 99 bpm 99 bpm 99 bpm 99 bpm 84 bpm   Heart Rate (Exit) 85 bpm 85 bpm 85 bpm 85 bpm 77 bpm   Oxygen Saturation (Admit) 92 % 92 % 92 % 92 % 94 %   Oxygen Saturation (Exercise) 94 % 94 % 94 % 94 % 97 %   Oxygen Saturation (Exit) 95 % 95 % 95 % 95 % 96 %   Rating of Perceived Exertion (Exercise) _7 Perceived Dyspnea (Exercise) _8 Resistance Training   Training  Prescription _9    Weight _10 Reps 10-12 10-12 10-12 10-12 10-12   Treadmill   MPH 1.9 2.2 2.2 2.2 2.2   Grade 0 0 1 1 0   Minutes _11 Recumbant Bike   Level 3    3   RPM 50    50   Watts 15       Minutes     15   NuStep   Level  _12 Watts  50 50 60 60   Minutes  _13 REL-XR   Level _14 Watts 60 60 60 60 60   Minutes _15 03/03/15 1200 03/06/15 1500 03/24/15 1200 03/27/15 1200 03/31/15 1400   Exercise Review   Progression Yes Yes Yes Yes    Response to Exercise   Blood Pressure (Admit) 128/70 mmHg 112/60 mmHg  118/64 mmHg    Blood Pressure (Exercise) 150/90 mmHg 138/70 mmHg  128/70 mmHg    Blood Pressure (Exit) 122/62 mmHg 120/70 mmHg  100/66 mmHg    Heart Rate (Admit) 89 bpm 93 bpm  76 bpm    Heart Rate (Exercise) 84 bpm 98 bpm  101 bpm    Heart Rate (Exit) 77 bpm 84 bpm  73 bpm    Oxygen Saturation (Admit) 94 % 94 %  95 %    Oxygen Saturation (Exercise) 97 % 65 %  93 %  Oxygen Saturation (Exit) 96 % 94 %  92 %    Rating of Perceived Exertion (Exercise) _0 Perceived Dyspnea (Exercise) _1 Comments   Abriana did well with her post 6MW test today considering all the health issues she has been having. Her distance was less than at intake and we discussed reasons this may be (surgeries she has had since then, pain, etc.). She did give a full effrot and was commended for it. Her treadmill time has been pushed back due to pain and symptoms she has been having and we discussed slowly progressing back to where she was.  Juliany did well with her post 6MW test today considering all the health issues she has been having. Her distance was less than at intake and we discussed reasons this may be (surgeries she has had since then, pain, etc.). She did give a full effrot and was commended for it. Her treadmill time has been pushed back due to pain and symptoms she has been having and we discussed slowly  progressing back to where she was.  Ms. Mcculloh was instructed by her doctor to decrease her prednisone from 23m to 12.562m  Duration Progress to 50 minutes of aerobic without signs/symptoms of physical distress Progress to 50 minutes of aerobic without signs/symptoms of physical distress Progress to 50 minutes of aerobic without signs/symptoms of physical distress Progress to 50 minutes of aerobic without signs/symptoms of physical distress    Intensity THRR unchanged THRR unchanged THRR unchanged THRR unchanged    Progression Continue progressive overload as per policy without signs/symptoms or physical distress. Continue progressive overload as per policy without signs/symptoms or physical distress. Continue progressive overload as per policy without signs/symptoms or physical distress. Continue progressive overload as per policy without signs/symptoms or physical distress.    Resistance Training   Training Prescription Yes Yes Yes Yes    Weight _2 Reps 10-12 10-12 10-12 10-12    Treadmill   MPH 2.2  Doing 10 minute intervals x 4 1.9  Doing 10 minute intervals x 2 1.9  Doing 10 minute intervals x 2 2.3  2 sets of 1583m    Grade 0 0 0 0    Minutes 40 40 14 15    Recumbant Bike   Level    2.5    RPM    40    Minutes    15    REL-XR   Level  _3 Watts  _4 Minutes  _5 Home Exercise Plan   Plans to continue exercise at    [facility] stationary bike; plans to purchase TM      04/03/15 1200 04/03/15 1500 04/07/15 1600       Exercise Review   Progression Yes Yes      Response to Exercise   Blood Pressure (Admit)  120/66 mmHg 120/80 mmHg     Blood Pressure (Exercise)  134/74 mmHg 144/72 mmHg     Blood Pressure (Exit)  112/72 mmHg 130/72 mmHg     Heart Rate (Admit)  107 bpm 84 bpm     Heart Rate (Exercise)  105 bpm 102 bpm     Heart Rate (Exit)  80 bpm 77 bpm     Oxygen Saturation (Admit)  96 % 94 %     Oxygen  Saturation (Exercise)  94 % 94  %     Oxygen Saturation (Exit)  97 % 93 %     Rating of Perceived Exertion (Exercise)  12 13     Perceived Dyspnea (Exercise)  3 3     Comments Ms. Insley was instructed by her doctor to decrease her prednisone from 54m to 12.563mMs. Sweis was instructed by her doctor to decrease her prednisone from 1526mo 12.5mg73mst day of exercise.  Ms. MullBiernatns to exercise at home using her stationary bike and plans to purchase a treadmill     Frequency  Add 2 additional days to program exercise sessions. Add 2 additional days to program exercise sessions.     Duration  Progress to 50 minutes of aerobic without signs/symptoms of physical distress Progress to 50 minutes of aerobic without signs/symptoms of physical distress     Intensity  Rest + 30 Rest + 30     Progression  Continue progressive overload as per policy without signs/symptoms or physical distress. Continue progressive overload as per policy without signs/symptoms or physical distress.     Resistance Training   Training Prescription  Yes Yes     Weight  1 1     Reps  10-12 10-12     Treadmill   MPH  30 30     Grade  2.3 2.3     Minutes  0 0     Recumbant Bike   Level _0 RPM  40 40     Minutes _1 REL-XR   Level  3 3     Watts  44 44     Minutes  15 15     Home Exercise Plan   [facility]  Stationary bike at home and plans to purchase TM        Discharge Exercise Prescription (Final Exercise Prescription Changes):     Exercise Prescription Changes - 04/07/15 1600    Response to Exercise   Blood Pressure (Admit) 120/80 mmHg   Blood Pressure (Exercise) 144/72 mmHg   Blood Pressure (Exit) 130/72 mmHg   Heart Rate (Admit) 84 bpm   Heart Rate (Exercise) 102 bpm   Heart Rate (Exit) 77 bpm   Oxygen Saturation (Admit) 94 %   Oxygen Saturation (Exercise) 94 %   Oxygen Saturation (Exit) 93 %   Rating of Perceived Exertion (Exercise) 13    Perceived Dyspnea (Exercise) 3   Comments Last day of exercise.  Ms. MullLaseterns to exercise at home using her stationary bike and plans to purchase a treadmill   Frequency Add 2 additional days to program exercise sessions.   Duration Progress to 50 minutes of aerobic without signs/symptoms of physical distress   Intensity Rest + 30   Progression Continue progressive overload as per policy without signs/symptoms or physical distress.   Resistance Training   Training Prescription Yes   Weight 1   Reps 10-12   Treadmill   MPH 30   Grade 2.3   Minutes 0   Recumbant Bike   Level 3   RPM 40   Minutes 15   REL-XR   Level 3   Watts 44   Minutes 15   Home Exercise Plan   Plans to continue exercise at [facility] at home and plans to purchase TM  Nutrition:  Target Goals: Understanding of nutrition guidelines, daily intake of sodium <1544m, cholesterol <2089m calories 30% from fat and 7% or less from saturated fats, daily to have 5 or more servings of fruits and vegetables.  Biometrics:      Post Biometrics - 03/24/15 1239     Post  Biometrics   Height _0  (1.676 m)   Weight 201 lb 11.2 oz (91.491 kg)   Waist Circumference 42.5 inches   Hip Circumference 43.5 inches   Waist to Hip Ratio 0.98 %   BMI (Calculated) 32.6      Nutrition Therapy Plan and Nutrition Goals:     Nutrition Therapy & Goals - 12/06/14 1130    Nutrition Therapy   Diet Ms MuHakansonrefers not to meet with the dietitian; she states she known what to do; she eats out mostly, but does eat only half and takes the rest to go home; she would like to loss 20lbs      Nutrition Discharge: Rate Your Plate Scores:   Psychosocial: Target Goals: Acknowledge presence or absence of depression, maximize coping skills, provide positive support system. Participant is able to verbalize types and ability to use techniques and skills needed for reducing stress and depression.  Initial Review &  Psychosocial Screening:     Initial Psych Review & Screening - 12/06/14 1130    Family Dynamics   Good Support System? Yes   Comments Her husband and 2 children are very supportive; her sister lives close and is supportive.   Barriers   Psychosocial barriers to participate in program There are no identifiable barriers or psychosocial needs.;The patient should benefit from training in stress management and relaxation.   Screening Interventions   Interventions Encouraged to exercise;Program counselor consult      Quality of Life Scores:     Quality of Life - 03/22/15 1130    Quality of Life Scores   Health/Function Post 9.13 %   Health/Function % Change -45.01 %   Socioeconomic Post 11.93 %   Socioeconomic % Change -50.15 %   Psych/Spiritual Post 16.29 %   Psych/Spiritual % Change -32.14 %   Family Post 0 %   Family % Change 100 %   GLOBAL Post 9.81 %   GLOBAL % Change -52.84 %      PHQ-9:     Recent Review Flowsheet Data    [facility] 03/24/2015 03/22/2015 02/20/2015 12/21/2014 12/06/2014   Decreased Interest 0 0 0 0 0   Down, Depressed, Hopeless 0 0 0 0 0   PHQ - 2 Score 0 0 0 0 0      Psychosocial Evaluation and Intervention:     Psychosocial Evaluation - 04/03/15 1153    Discharge Psychosocial Assessment & Intervention   Comments Counselor met with Ms. Cardiff for discharge evaluation.  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  Since that Diagnosis, she reports feeling like she had to "start over" in this program.  Her stamina has improved and her breathing is "okay" with two recent episodes of shortness of breath that were "bad."  Ms. MuMoraceeports she is on medication which is helping with the symptoms.  Her appetite and sleep are fine currently and she has maintained a positive mood through it all.  She plans for follow up to exercise with home equipment.  Ms. MuAuthieras been a pleasure to work  with in this program.  Psychosocial Re-Evaluation:     Psychosocial Re-Evaluation      01/04/15 1037           Psychosocial Re-Evaluation   Comments Counselor met with Ms. Stump today for a follow up.  She reports being stronger and having more energy to garden and do those things that she enjoys since beginning this program.  She is working hard on her goals and it appears to be paying off for Ms. Weyer.           Education: Education Goals: Education classes will be provided on a weekly basis, covering required topics. Participant will state understanding/return demonstration of topics presented.  Learning Barriers/Preferences:     Learning Barriers/Preferences - 12/06/14 1130    Learning Barriers/Preferences   Learning Barriers None   Learning Preferences Group Instruction;Individual Instruction;Pictoral;Skilled Demonstration;Verbal Instruction;Video;Written Material      Education Topics: Initial Evaluation Education: - Verbal, written and demonstration of respiratory meds, RPE/PD scales, oximetry and breathing techniques. Instruction on use of nebulizers and MDIs: cleaning and proper use, rinsing mouth with steroid doses and importance of monitoring MDI activations.          Pulmonary Rehab from 04/07/2015 in [place]   Date  12/06/14   Educator  LB   Instruction Review Code  2- meets goals/outcomes      General Nutrition Guidelines/Fats and Fiber: -Group instruction provided by verbal, written material, models and posters to present the general guidelines for heart healthy nutrition. Gives an explanation and review of dietary fats and fiber.      Pulmonary Rehab from 04/07/2015 in [place]   Date  02/06/15   Educator  Karolee Stamps, RD   Instruction Review Code  2- meets goals/outcomes      Controlling Sodium/Reading Food Labels: -Group verbal and written material  supporting the discussion of sodium use in heart healthy nutrition. Review and explanation with models, verbal and written materials for utilization of the food label.   Exercise Physiology & Risk Factors: - Group verbal and written instruction with models to review the exercise physiology of the cardiovascular system and associated critical values. Details cardiovascular disease risk factors and the goals associated with each risk factor.      Pulmonary Rehab from 04/07/2015 in [place]   Date  03/01/15   Educator  SW   Instruction Review Code  2- meets goals/outcomes      Aerobic Exercise & Resistance Training: - Gives group verbal and written discussion on the health impact of inactivity. On the components of aerobic and resistive training programs and the benefits of this training and how to safely progress through these programs.      Pulmonary Rehab from 04/07/2015 in [place]   Date  12/28/14   Educator  SW   Instruction Review Code  2- meets goals/outcomes      Flexibility, Balance, General Exercise Guidelines: - Provides group verbal and written instruction on the benefits of flexibility and balance training programs. Provides general exercise guidelines with specific guidelines to those with heart or lung disease. Demonstration and skill practice provided.      Pulmonary Rehab from 04/07/2015 in [place]   Date  01/25/15   Educator  SW   Instruction Review Code  2- meets goals/outcomes      Stress Management: - Provides group verbal and written instruction about the health  risks of elevated stress, cause of high stress, and healthy ways to reduce stress.   Depression: - Provides group verbal and written instruction on the correlation between heart/lung disease and depressed mood, treatment options, and the stigmas associated with seeking treatment.       Pulmonary Rehab from 04/07/2015 in [place]   Date  03/15/15   Educator  [facility]   Instruction Review Code  2- meets goals/outcomes      Exercise & Equipment Safety: - Individual verbal instruction and demonstration of equipment use and safety with use of the equipment.      Pulmonary Rehab from 04/07/2015 in [place]   Date  04/07/15   Educator  S.Zaleigh Bermingham   Instruction Review Code  2- meets goals/outcomes [Plans to use Bicycle and purchase TM for use at home.]      Infection Prevention: - Provides verbal and written material to individual with discussion of infection control including proper hand washing and proper equipment cleaning during exercise session.      Pulmonary Rehab from 04/07/2015 in [place]   Date  12/12/14   Educator  LB   Instruction Review Code  2- meets goals/outcomes      Falls Prevention: - Provides verbal and written material to individual with discussion of falls prevention and safety.   Diabetes: - Individual verbal and written instruction to review signs/symptoms of diabetes, desired ranges of glucose level fasting, after meals and with exercise. Advice that pre and post exercise glucose checks will be done for 3 sessions at entry of program.   Chronic Lung Diseases: - Group verbal and written instruction to review new updates, new respiratory medications, new advancements in procedures and treatments. Provide informative websites and "800" numbers of self-education.      Pulmonary Rehab from 04/07/2015 in [place]   Date  01/30/15   Educator  Carson Myrtle, RRT   Instruction Review Code  2- meets goals/outcomes      Lung Procedures: - Group verbal and written instruction to describe testing methods done to diagnose lung disease. Review the outcome of test results. Describe the treatment choices:  Pulmonary Function Tests, ABGs and oximetry.      Pulmonary Rehab from 04/07/2015 in [place]   Date  01/27/15   Educator  [place]   Instruction Review Code  2- meets goals/outcomes      Energy Conservation: - Provide group verbal and written instruction for methods to conserve energy, plan and organize activities. Instruct on pacing techniques, use of adaptive equipment and posture/positioning to relieve shortness of breath.      Pulmonary Rehab from 04/07/2015 in [place]   Date  02/08/15   Educator  SW   Instruction Review Code  2- meets goals/outcomes      Triggers: - Group verbal and written instruction to review types of environmental controls: home humidity, furnaces, filters, dust mite/pet prevention, HEPA vacuums. To discuss weather changes, air quality and the benefits of nasal washing.      Pulmonary Rehab from 04/07/2015 in [place]   Date  01/02/15   Educator  LB   Instruction Review Code  2- meets goals/outcomes      Exacerbations: - Group verbal and written instruction to provide: warning signs, infection symptoms, calling MD promptly, preventive modes, and value of vaccinations. Review: effective airway  clearance, coughing and/or vibration techniques. Create an Sports administrator.   Oxygen: - Individual and group verbal and written instruction on oxygen therapy. Includes supplement oxygen, available portable oxygen systems, continuous and intermittent flow rates, oxygen safety, concentrators, and Medicare reimbursement for oxygen.   Respiratory Medications: - Group verbal and written instruction to review medications for lung disease. Drug class, frequency, complications, importance of spacers, rinsing mouth after steroid MDI's, and proper cleaning methods for nebulizers.      Pulmonary Rehab from 04/07/2015 in [place]    Date  12/06/14   Educator  LB   Instruction Review Code  2- meets goals/outcomes      AED/CPR: - Group verbal and written instruction with the use of models to demonstrate the basic use of the AED with the basic ABC's of resuscitation.      Pulmonary Rehab from 04/07/2015 in [place]   Date  02/03/15   Educator  CE   Instruction Review Code  2- meets goals/outcomes      Breathing Retraining: - Provides individuals verbal and written instruction on purpose, frequency, and proper technique of diaphragmatic breathing and pursed-lipped breathing. Applies individual practice skills.      Pulmonary Rehab from 04/07/2015 in [place]   Date  12/06/14   Educator  LB   Instruction Review Code  2- meets goals/outcomes      Anatomy and Physiology of the Lungs: - Group verbal and written instruction with the use of models to provide basic lung anatomy and physiology related to function, structure and complications of lung disease.      Pulmonary Rehab from 04/07/2015 in [place]   Date  03/10/15   Educator  Frederich Cha, RRT   Instruction Review Code  2- meets goals/outcomes      Heart Failure: - Group verbal and written instruction on the basics of heart failure: signs/symptoms, treatments, explanation of ejection fraction, enlarged heart and cardiomyopathy.      Pulmonary Rehab from 04/07/2015 in [place]   Date  04/07/15   Educator  CE   Instruction Review Code  2- meets goals/outcomes      Sleep Apnea: - Individual verbal and written instruction to review Obstructive Sleep Apnea. Review of risk factors, methods for diagnosing and types of masks and machines for OSA.   Anxiety: - Provides group, verbal and written instruction on the correlation between heart/lung disease and anxiety, treatment options, and management of  anxiety.      Pulmonary Rehab from 04/07/2015 in [place]   Date  02/15/15   Educator  Berle Mull, MSW   Instruction Review Code  2- Meets goals/outcomes      Relaxation: - Provides group, verbal and written instruction about the benefits of relaxation for patients with heart/lung disease. Also provides patients with examples of relaxation techniques.   Knowledge Questionnaire Score:     Knowledge Questionnaire Score - 12/06/14 1130    Knowledge Questionnaire Score   Pre Score -2      Personal Goals and Risk Factors at Admission:     Personal Goals and Risk Factors at Admission - 12/07/14 0835    Personal Goals and Risk Factors on Admission    Weight Management Yes   Intervention Learn and follow the exercise and diet guidelines while in the program. Utilize the nutrition and education classes to help  gain knowledge of the diet and exercise expectations in the program   Increase Aerobic Exercise and Physical Activity Yes   Intervention While in program, learn and follow the exercise prescription taught. Start at a low level workload and increase workload after able to maintain previous level for 30 minutes. Increase time before increasing intensity.   Understand more about Heart/Pulmonary Disease. Yes   Intervention While in program utilize professionals for any questions, and attend the education sessions. Great websites to use are www.americanheart.org or www.lung.org for reliable information.   Improve shortness of breath with ADL's Yes   Intervention While in program, learn and follow the exercise prescription taught. Start at a low level workload and increase workload ad advised by the exercise physiologist. Increase time before increasing intensity.   Develop more efficient breathing techniques such as purse lipped breathing and diaphragmatic breathing; and practicing self-pacing with activity Yes   Intervention While in program,  learn and utilize the specific breathing techniques taught to you. Continue to practice and use the techniques as needed.   Increase knowledge of respiratory medications and ability to use respiratory devices properly.  Yes   Intervention While in program, learn to administer MDI, nebulizer, and spacer properly.;Learn to take respiratory medicine as ordered.;While in program, learn to Clean MDI, nebulizers, and spacers properly.   Diabetes Yes   Goal Blood glucose control identified by blood glucose values, HgbA1C. Participant verbalizes understanding of the signs/symptoms of hyper/hypo glycemia, proper foot care and importance of medication and nutrition plan for blood glucose control.   Intervention Provide nutrition & aerobic exercise along with prescribed medications to achieve blood glucose in normal ranges: Fasting 65-99 mg/dL   Hypertension Yes   Goal Participant will see blood pressure controlled within the values of 140/64m/Hg or within value directed by their physician.   Intervention Provide nutrition & aerobic exercise along with prescribed medications to achieve BP 140/90 or less.      Personal Goals and Risk Factors Review:      Goals and Risk Factor Review      12/19/14 1130 01/02/15 1130 01/02/15 1233 01/04/15 1000 01/20/15 1130   Increase Aerobic Exercise and Physical Activity   Goals Progress/Improvement seen    Yes  Yes   Comments   wants to purchase a treadmill  [place] and [place] from her exercise in [facility].   Understand more about Heart/Pulmonary Disease   Goals Progress/Improvement seen   Yes      Comments  Ms MVenturiis attending our educational sessions and has contributed very helpful information for  the group with some of our educational sessions.      Improve shortness of breath with ADL's   Goals Progress/Improvement seen    Yes     Comments   gardening is easier     Breathing  Techniques   Goals Progress/Improvement seen  Yes  Yes     Comments Observed Ms MKargeperform PLB during exercise on the equipment. She states she uses the technique at home with activities.       Increase knowledge of respiratory medications   Goals Progress/Improvement seen     Yes    Comments    Discussed MDI use of her Symbicort, Albuterol, and Spacer - properly using      01/25/15 1256 02/17/15 1404 03/10/15 1554 03/22/15 1130 03/28/15 0828   Weight Management   Goals Progress/Improvement seen  No       Comments Has not seen progress in this area since starting the program, however, her arthritis makes it difficult to do a lot of physical activity and some days she can do nothing because of the pain.     Has not seen progress in this area since starting the program, however, her arthritis makes it difficult to do a lot of physical activity and some days she can do nothing because of the pain.    Increase Aerobic Exercise and Physical Activity   Goals Progress/Improvement seen  Yes No   Yes   Comments Is challenged by the exercise in class and has been able to make progressions, however, her arthritis causes her to have to step back sometimes. She added elevation to the treadmill, but is unable to accomplish this goal on bad arthritis days. She has a hottub that she does water exercises in and this has helped the arthritis. She can do gardening, shopping and activities around the house easier now.  Due to Sara's joint disease (arteritis) she can no longer do seated exercises because her joints will "lock up". Her doctor told her to do the treadmill in order to loosen the joints. She is now doing all of her exercise time on the treadmill in three intervals of 10-15 minutes.    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 She admits she has good days and bad days as far as pain. She has worked her way back to 2 82mn sets on the TM and 131ms on the BX. She  plans to continue her exercise on her stationary  bike at h ome and is purchasing a TM for home as well.   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 [facility] and enjoys learning in general.  DiAlyzahays she is learning a lot on how to manage the combination of chronic diseases that she has. Her doctors are still trying to find the right medication doses and the right plan to address all of her issues.  Lung anatomy education was provided today.  Ms. MuHogstonas also given a few website addresses that can teach her more about heart and lung disease.  Ms MuMastroas attended our [place] and always has information to share with the group. She works hard to keep up with new information on Sleep Apnea and Asthma.   Improve shortness of breath with ADL's   Goals Progress/Improvement seen  Yes   Yes Yes   Comments Can do activities around the house easier and is involved in several clubs. She can manage a more full schedule now that she has more stamina    Ms MuMitschketays very activie with involvement in several clubs and does activities around the house with less shortness of breath and more stamina.   Breathing Techniques   Goals Progress/Improvement seen  Yes Yes      Comments Uses PLB and conserves energy when she feels short of breath, especially when she is outside [place] she thinks she is a more efficient breather now and is using breathing techniques to conserve her energy and to distract her from joint pain she feels elsewhere. She uses breathing techniques to  help her focus.    Brynnley said she thinks she is a more efficient breather now and is using breathing techniques to conserve her energy and to distract her from joint pain she feels elsewhere.  She uses breathing techniques to help her focus.    Increase knowledge of respiratory medications   Goals Progress/Improvement seen      Yes   Comments     Ms Vinje has good understanding and use of her MDI's: Symbicort, Albuterol, Spacer. She also has good understanding of her CPAP and is very compliant with use every night.   Diabetes   Goal     --  No problems with glucose levels with exercise class.   Hypertension   Goal     --  Maintaining acceptable levels of BP with rest and exercise.     04/03/15 1533           Weight Management   Comments Has not seen progress in this area since starting the program, however, her arthritis makes it difficult to do a lot of physical activity and some days she can do nothing because of the pain.        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 She admits she has good days and bad days as far as pain. She has worked her way back to 2 7mn sets on the TM and 120ms on the BX. She plans to continue her exercise on her stationary  bike at h ome and is purchasing a TM for home as well.       Understand more about Heart/Pulmonary Disease   Comments Ms MuLamoreauxas attended our [facility] classes and always has information to share with the group. She works hard to keep up with new information on Sleep Apnea and Asthma.       Improve shortness of breath with ADL's   Comments Ms MuKopketays very activie with involvement in several clubs and does activities around the house with less shortness of breath and more stamina.       Breathing Techniques   Comments DiGeetikaaid she thinks she is a more efficient breather now and is using breathing techniques to conserve her energy and to distract her from joint pain she feels elsewhere. She uses breathing techniques to help her focus.        Diabetes   Goal --  No known problems with blood glucose levels.        Hypertension   Goal --  Maintained acceptable BP's with rest and exercise.          Personal Goals Discharge (Final Personal Goals and Risk Factors Review):      Goals and Risk Factor Review - 04/03/15 1533    Weight Management   Comments Has not seen progress in this area since starting the program, however, her arthritis makes it difficult to do a lot of physical activity and some days she can do nothing because of the pain.    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 She admits she has good days and bad days as far as pain. She has worked her way back to 2 1549msets on the TM and 39m22mon the BX. She plans to continue her exercise on her stationary  bike at h ome and is purchasing  a TM for home as well.   Understand more about Heart/Pulmonary Disease   Comments Ms Reimers has attended our [facility] classes and always has information to share with the group. She works hard to keep up with new information on Sleep Apnea and Asthma.   Improve shortness of breath with ADL's   Comments Ms Olivar stays very activie with involvement in several clubs and does activities around the house with less shortness of breath and more stamina.   Breathing Techniques   Comments Collen said she thinks she is a more efficient breather now and is using breathing techniques to conserve her energy and to distract her from joint pain she feels elsewhere. She uses breathing techniques to help her focus.    Diabetes   Goal --  No known problems with blood glucose levels.   Hypertension   Goal --  Maintained acceptable BP's with rest and exercise.      Comments: Ms. Sofranko plans to continue her exercise at home.  She has a stationary bike and is planning to purchase a treadmill for use at home.

## 2015-04-07 NOTE — Progress Notes (Signed)
Daily Session Note  Patient Details  Name: Mary Cantrell MRN: [id number] Date of Birth: [birth year] Referring Provider:  Steele Sizer, MD  Encounter Date: 04/07/2015  Check In:     Session Check In - 04/07/15 1155    Check-In   Staff Present Frederich Cha RRT, RCP Respiratory Therapist;Danniella Robben Dillard Essex MS, ACSM CEP Exercise Physiologist;Carroll Enterkin RN, BSN   ER physicians immediately available to respond to emergencies [facility] immediately available ER MD   Physician(s) Jacqualine Code and Paduchowski   Medication changes reported     No   Fall or balance concerns reported    No   Warm-up and Cool-down Performed on first and last piece of equipment   VAD Patient? No   Pain Assessment   Currently in Pain? No/denies   Multiple Pain Sites No         Goals Met:  Proper associated with RPD/PD & O2 Sat Independence with exercise equipment Using PLB without cueing & demonstrates good technique Exercise tolerated well Strength training completed today  Goals Unmet:  Not Applicable  Goals Comments: Lauraine had a productive exercise day today.    Dr. Emily Filbert is Medical Director for [place] and [facility].

## 2015-04-13 ENCOUNTER — Other Ambulatory Visit: Payer: Self-pay

## 2015-04-13 ENCOUNTER — Ambulatory Visit (INDEPENDENT_AMBULATORY_CARE_PROVIDER_SITE_OTHER): Payer: Medicare Other | Admitting: Family Medicine

## 2015-04-13 ENCOUNTER — Encounter (INDEPENDENT_AMBULATORY_CARE_PROVIDER_SITE_OTHER): Payer: Self-pay

## 2015-04-13 ENCOUNTER — Encounter: Payer: Self-pay | Admitting: Family Medicine

## 2015-04-13 ENCOUNTER — Other Ambulatory Visit: Payer: Self-pay | Admitting: Family Medicine

## 2015-04-13 VITALS — BP 138/80 | HR 100 | Temp 98.3°F | Resp 16 | Ht 66.0 in | Wt 203.5 lb

## 2015-04-13 DIAGNOSIS — Z1231 Encounter for screening mammogram for malignant neoplasm of breast: Secondary | ICD-10-CM

## 2015-04-13 DIAGNOSIS — E785 Hyperlipidemia, unspecified: Secondary | ICD-10-CM | POA: Diagnosis not present

## 2015-04-13 DIAGNOSIS — Z23 Encounter for immunization: Secondary | ICD-10-CM | POA: Diagnosis not present

## 2015-04-13 DIAGNOSIS — M353 Polymyalgia rheumatica: Secondary | ICD-10-CM

## 2015-04-13 DIAGNOSIS — J45998 Other asthma: Secondary | ICD-10-CM

## 2015-04-13 DIAGNOSIS — E119 Type 2 diabetes mellitus without complications: Secondary | ICD-10-CM

## 2015-04-13 DIAGNOSIS — J45909 Unspecified asthma, uncomplicated: Secondary | ICD-10-CM

## 2015-04-13 DIAGNOSIS — K3184 Gastroparesis: Secondary | ICD-10-CM | POA: Diagnosis not present

## 2015-04-13 DIAGNOSIS — E1143 Type 2 diabetes mellitus with diabetic autonomic (poly)neuropathy: Secondary | ICD-10-CM

## 2015-04-13 DIAGNOSIS — R7982 Elevated C-reactive protein (CRP): Secondary | ICD-10-CM | POA: Diagnosis not present

## 2015-04-13 DIAGNOSIS — I10 Essential (primary) hypertension: Secondary | ICD-10-CM | POA: Diagnosis not present

## 2015-04-13 LAB — POCT UA - MICROALBUMIN: MICROALBUMIN (UR) POC: 20 mg/L

## 2015-04-13 LAB — POCT GLYCOSYLATED HEMOGLOBIN (HGB A1C): Hemoglobin A1C: 8.4

## 2015-04-13 MED ORDER — PREDNISONE 5 MG PO TABS: 10.0000 mg | ORAL_TABLET | Freq: Every day | ORAL | Status: DC

## 2015-04-13 MED ORDER — GLIPIZIDE ER 5 MG PO TB24: 5.0000 mg | ORAL_TABLET | Freq: Every day | ORAL | Status: DC

## 2015-04-13 MED ORDER — PREDNISONE 2.5 MG PO TABS: 2.5000 mg | ORAL_TABLET | Freq: Every evening | ORAL | Status: DC

## 2015-04-13 NOTE — Progress Notes (Signed)
Name: Sheri Gatchel   MRN: [id number]    DOB: August 03, 1945   Date:04/13/2015       Progress Note  Subjective  Chief Complaint  Chief Complaint  Patient presents with  . Medication Refill    follow-up  . Diabetes    checks BG 1 qd low-74, high-202  . Hypertension  . Hyperlipidemia    HPI  Polymyalgia rheumatica:  Seen by Rheumatologist in [place] two weeks ago. The prednisone was changed from 5 mg three times daily to 10 mg in am and 2.5 mg at night. She states that feels very stiff in am's, and if she sits down for a prolonged period of time. Her pain is 2/10, so symptoms have improved since diagnosis in September.  Negative temporal artery biopsy. C-reactive protein has improved down to 1.5 and Sed rate back to normal when checked 03/29/2015 by Rheumatologist.  Other symptoms: she feels sweaty.  HTN: bp is at goal, taking medication as prescribed, no chest pain or palpitation  Asthma Moderate: seen by pulmonologist, completed pulmonary rehab and is feeling better, but still has SOB with activity   Hyperlipidemia: taking medication as prescribed and is doing well, no side effects.   DM II gastroparesis: she is only taking Metformin now and denies side effects, glucose at home has been at goal , occasionally it goes up 202. No polyphagia but has  polydipsia or polyuria. States occasionally has some bloating but is doing well at this time     Patient Active Problem List   Diagnosis Date Noted  . Polymyalgia rheumatica ([place]) 04/13/2015  . Benign essential HTN 12/21/2014  . Back pain, chronic 12/21/2014  . Narrowing of intervertebral disc space 12/21/2014  . Diabetic gastroparesis ([place]) 12/21/2014  . Amaurosis fugax of left eye 12/21/2014  . H/O: hypothyroidism 12/21/2014  . Hyperlipidemia 12/21/2014  . IBS (irritable bowel syndrome) 12/21/2014  . Disorder of macula of retina 12/21/2014  . Asthma, moderate 12/21/2014  . NASH (nonalcoholic steatohepatitis) 12/21/2014  . NS  (nuclear sclerosis) 12/21/2014  . Osteoarthrosis 12/21/2014  . Obesity (BMI 30-39.9) 12/21/2014  . Psoriasis 12/21/2014  . Knee torn cartilage 12/21/2014  . Closed dislocation of jaw 12/21/2014  . Diabetes mellitus type 2 in obese ([place]) 12/21/2014  . Obstructive apnea 01/03/2014  . History of shoulder surgery 04/23/2011    Past Surgical History  Procedure Laterality Date  . Tonsillectomy    . Abdominal hysterectomy    . Appendectomy    . Pilonidal cyst excision    . Shoulder arthroscopy Right [id number]    Dr. Mauri Pole  . Excision / curettage bone cyst finger      Left Index Finger  . Stomach surgery  1941    3 arteries leaking in stomach after appendectomy  . Artery biopsy N/A 02/23/2015    Procedure: BIOPSY TEMPORAL ARTERY;  Surgeon: Algernon Huxley, MD;  Location: [facility] ORS;  Service: Vascular;  Laterality: N/A;    Family History  Problem Relation Age of Onset  . Rheum arthritis Father   . Diabetes Father   . Pneumonia Father   . Diabetes Sister   . Osteoarthritis Sister   . Pneumonia Mother     Social History   Social History  . Marital Status: Married    Spouse Name: N/A  . Number of Children: N/A  . Years of Education: N/A   Occupational History  . Not on file.   Social History Main Topics  . Smoking status: Never Smoker   .  Smokeless tobacco: Never Used  . Alcohol Use: No  . Drug Use: No  . Sexual Activity:    Partners: Male     Comment: hysterectomy   Other Topics Concern  . Not on file   Social History Narrative     Current outpatient prescriptions:  .  cholecalciferol (VITAMIN D) 1000 UNITS tablet, Take 1,000 Units by mouth daily., Disp: , Rfl:  .  albuterol (VENTOLIN HFA) 108 (90 BASE) MCG/ACT inhaler, Inhale 2 puffs into the lungs every 4 (four) hours as needed for wheezing (takes every am and then as needed.). , Disp: , Rfl:  .  aspirin 81 MG tablet, Take 81 mg by mouth every morning. , Disp: , Rfl:  .  budesonide-formoterol (SYMBICORT) 160-4.5  MCG/ACT inhaler, Inhale 2 puffs into the lungs every morning. , Disp: , Rfl:  .  Cinnamon 500 MG capsule, Take 500 mg by mouth every morning. , Disp: , Rfl:  .  cyanocobalamin 100 MCG tablet, Take 100 mcg by mouth every morning. , Disp: , Rfl:  .  diphenhydrAMINE (BENADRYL) 25 MG tablet, Take 25 mg by mouth at bedtime as needed (nose gets clogged up with CPAP machine). , Disp: , Rfl:  .  fluticasone (FLONASE) 50 MCG/ACT nasal spray, Place 2 sprays into both nostrils every morning. , Disp: , Rfl:  .  gabapentin (NEURONTIN) 100 MG capsule, Take 1 capsule (100 mg total) by mouth 3 (three) times daily. (Patient taking differently: Take 100 mg by mouth at bedtime. 2 capsules at bedtime), Disp: 180 capsule, Rfl: 4 .  glucose blood (ONE TOUCH ULTRA TEST) test strip, 1 each by Other route 2 (two) times daily. Use as instructed, Disp: 100 each, Rfl: 2 .  HYDROcodone-acetaminophen (NORCO) 5-325 MG per tablet, Take 1 tablet by mouth every 6 (six) hours as needed for moderate pain., Disp: 30 tablet, Rfl: 0 .  losartan (COZAAR) 50 MG tablet, TAKE 1 TABLET DAILY FOR BLOOD PRESSURE, Disp: 90 tablet, Rfl: 1 .  meloxicam (MOBIC) 7.5 MG tablet, Take by mouth. On hold while taking prednisone, Disp: , Rfl:  .  metFORMIN (GLUCOPHAGE-XR) 500 MG 24 hr tablet, One in am and 2 in pm (Patient taking differently: 500 mg. One in am and 2 in pm), Disp: 90 tablet, Rfl: 2 .  pravastatin (PRAVACHOL) 40 MG tablet, TAKE 1 TABLET EVERY EVENING FOR CHOLESTEROL, Disp: 90 tablet, Rfl: 3 .  predniSONE (DELTASONE) 2.5 MG tablet, Take 1 tablet (2.5 mg total) by mouth every evening., Disp: 30 tablet, Rfl: 0 .  predniSONE (DELTASONE) 5 MG tablet, Take 2 tablets (10 mg total) by mouth daily with breakfast., Disp: 60 tablet, Rfl: 0 .  ranitidine (ZANTAC) 150 MG tablet, Take 1 tablet (150 mg total) by mouth 2 (two) times daily., Disp: 60 tablet, Rfl: 0 .  spironolactone-hydrochlorothiazide (ALDACTAZIDE) 25-25 MG per tablet, TAKE ONE-HALF (1/2) TO  ONE TABLET DAILY FOR BLOOD PRESSURE (Patient taking differently: TAKE ONE-HALF (1/2) TO ONE TABLET DAILY FOR BLOOD PRESSURE in am), Disp: 90 tablet, Rfl: 1 .  vitamin C (ASCORBIC ACID) 500 MG tablet, Take 1 tablet by mouth at bedtime. , Disp: , Rfl:   Allergies  Allergen Reactions  . Codeine     Other reaction(s): Other (See Comments) Dizzy  . Lantus  [Insulin Glargine]     made skin hot  . Metformin     Coated metformin is tolerated well.  . Sitagliptin   . Sulfa Antibiotics     Other reaction(s): Other (See  Comments) Red burning skin     ROS  Constitutional: Negative for fever or weight change.  Respiratory: Negative for cough but occasional shortness of breath.   Cardiovascular: Negative for chest pain or palpitations.  Gastrointestinal: Negative for abdominal pain, no bowel changes.  Musculoskeletal: Negative for gait problem , fingers and knee are painful, no joint swelling.  Skin: Negative for rash.  Neurological: Negative for dizziness or headache.  No other specific complaints in a complete review of systems (except as listed in HPI above).  Objective  Filed Vitals:   04/13/15 1347  BP: 138/80  Pulse: 100  Temp: 98.3 F (36.8 C)  TempSrc: Oral  Resp: 16  Height: 5' 6"  (1.676 m)  Weight: 203 lb 8 oz (92.307 kg)  SpO2: 97%    Body mass index is 32.86 kg/(m^2).  Physical Exam  Constitutional: Patient appears well-developed and well-nourished. Obese No distress.  HEENT: head atraumatic, normocephalic, pupils equal and reactive to light, neck supple, throat within normal limits Cardiovascular: Normal rate, regular rhythm and normal heart sounds.  No murmur heard. No BLE edema. Pulmonary/Chest: Effort normal and breath sounds normal. No respiratory distress. Abdominal: Soft.  There is no tenderness. Psychiatric: Patient has a normal mood and affect. behavior is normal. Judgment and thought content normal. Muscular Skeletal: no synovitis, no muscle  ache  Recent Results (from the past 2160 hour(s))  CBC with Differential/Platelet     Status: Abnormal   Collection Time: 02/10/15  4:45 PM  Result Value Ref Range   WBC 10.0 3.4 - 10.8 x10E3/uL   RBC 4.58 3.77 - 5.28 x10E6/uL   Hemoglobin 13.7 11.1 - 15.9 g/dL   Hematocrit 40.0 34.0 - 46.6 %   MCV 87 79 - 97 fL   MCH 29.9 26.6 - 33.0 pg   MCHC 34.3 31.5 - 35.7 g/dL   RDW 13.4 12.3 - 15.4 %   Platelets 392 (H) 150 - 379 x10E3/uL   Neutrophils 70 %   Lymphs 19 %   Monocytes 8 %   Eos 3 %   Basos 0 %   Neutrophils Absolute 6.9 1.4 - 7.0 x10E3/uL   Lymphocytes Absolute 1.9 0.7 - 3.1 x10E3/uL   Monocytes Absolute 0.8 0.1 - 0.9 x10E3/uL   EOS (ABSOLUTE) 0.3 0.0 - 0.4 x10E3/uL   Basophils Absolute 0.0 0.0 - 0.2 x10E3/uL   Immature Granulocytes 0 %   Immature Grans (Abs) 0.0 0.0 - 0.1 x10E3/uL  Sed Rate (ESR)     Status: None   Collection Time: 02/10/15  4:45 PM  Result Value Ref Range   Sed Rate 16 0 - 40 mm/hr  CK (Creatine Kinase)     Status: None   Collection Time: 02/10/15  4:45 PM  Result Value Ref Range   Total CK 79 24 - 173 U/L  Comprehensive metabolic panel     Status: None   Collection Time: 02/10/15  4:45 PM  Result Value Ref Range   Glucose 93 65 - 99 mg/dL   BUN 16 8 - 27 mg/dL   Creatinine, Ser 0.90 0.57 - 1.00 mg/dL   GFR calc non Af Amer 65 >59 mL/min/1.73   GFR calc Af Amer 75 >59 mL/min/1.73   BUN/Creatinine Ratio 18 11 - 26   Sodium 144 134 - 144 mmol/L   Potassium 3.7 3.5 - 5.2 mmol/L   Chloride 99 97 - 108 mmol/L   CO2 29 18 - 29 mmol/L   Calcium 10.0 8.7 - 10.3 mg/dL  Total Protein 6.9 6.0 - 8.5 g/dL   Albumin 4.6 3.5 - 4.8 g/dL   Globulin, Total 2.3 1.5 - 4.5 g/dL   Albumin/Globulin Ratio 2.0 1.1 - 2.5   Bilirubin Total 0.3 0.0 - 1.2 mg/dL   Alkaline Phosphatase 98 39 - 117 IU/L   AST 21 0 - 40 IU/L   ALT 19 0 - 32 IU/L  C-reactive protein     Status: Abnormal   Collection Time: 02/13/15  2:17 PM  Result Value Ref Range   CRP 10.2 (H) 0.0  - 4.9 mg/L  ANA     Status: None   Collection Time: 02/13/15  2:17 PM  Result Value Ref Range   Anit Nuclear Antibody(ANA) Negative Negative  APTT     Status: None   Collection Time: 02/22/15  3:49 PM  Result Value Ref Range   aPTT 25 24 - 36 seconds  Protime-INR     Status: None   Collection Time: 02/22/15  3:49 PM  Result Value Ref Range   Prothrombin Time 12.8 11.4 - 15.0 seconds   INR 0.94   Type and screen     Status: None   Collection Time: 02/22/15  3:49 PM  Result Value Ref Range   ABO/RH(D) O POS    Antibody Screen POS    Sample Expiration 03/08/2015    Antibody Identification ANTI JKA     Unit Number [id number]    Blood Component Type RED CELLS,LR    Unit division 00    Status of Unit REL FROM [facility]    Transfusion Status OK TO TRANSFUSE    Crossmatch Result COMPATIBLE    Unit Number [id number]    Blood Component Type RED CELLS,LR    Unit division 00    Status of Unit REL FROM [facility]    Transfusion Status OK TO TRANSFUSE    Crossmatch Result COMPATIBLE   ABO/Rh     Status: None   Collection Time: 02/22/15  3:50 PM  Result Value Ref Range   ABO/RH(D) O POS   Glucose, capillary     Status: Abnormal   Collection Time: 02/23/15 11:01 AM  Result Value Ref Range   Glucose-Capillary 132 (H) 65 - 99 mg/dL  Surgical pathology     Status: None   Collection Time: 02/23/15 12:59 PM  Result Value Ref Range   SURGICAL PATHOLOGY      Surgical Pathology CASE: [id number] PATIENT: Evgenia Demary Surgical Pathology Report     SPECIMEN SUBMITTED: A. Temporal artery, left, biopsy  CLINICAL HISTORY: None provided  PRE-OPERATIVE DIAGNOSIS: Giant cell arteritis  POST-OPERATIVE DIAGNOSIS: Giant cell arteritis     DIAGNOSIS: A. TEMPORAL ARTERY, LEFT; BIOPSY: - NEGATIVE FOR ARTERITIS.  Comment: The artery was examined microscopically over 112 levels. These results were communicated to [facility] in Dr. Bunnie Domino office on 02/24/2015. Read back procedure was  performed.  GROSS DESCRIPTION:  A. Labeled: left temporal artery Tissue Fragment(s): 1 Measurement: 1.9 cm long 0.15 cm in diameter cm Comment: red tubular fragment, submitted intact for histologic sectioning  Entirely submitted in cassette(s): 1   Final Diagnosis performed by Quay Burow, MD.  Electronically signed 02/24/2015 10:00:06AM    The electronic signature indicates that the named Attending Pathologist has evaluated the specime n  Technical component performed at [facility], [address] [place], [place] [address] Lab: [phone] Dir: Darrick Penna. Evette Doffing, MD  Professional component performed at [facility], [facility], [place], [place], [place] [address] Lab: [phone] Dir: Dellia Nims.  Rubinas, MD    Glucose, capillary     Status: Abnormal   Collection Time: 02/23/15  1:26 PM  Result Value Ref Range   Glucose-Capillary 187 (H) 65 - 99 mg/dL  POCT UA - Microalbumin     Status: Abnormal   Collection Time: 04/13/15  2:25 PM  Result Value Ref Range   Microalbumin Ur, POC 20 mg/L   Creatinine, POC  mg/dL   Albumin/Creatinine Ratio, Urine, POC    POCT HgB A1C     Status: Abnormal   Collection Time: 04/13/15  2:37 PM  Result Value Ref Range   Hemoglobin A1C 8.4     Diabetic Foot Exam: Diabetic Foot Exam - Simple   Simple Foot Form  Visual Inspection  No deformities, no ulcerations, no other skin breakdown bilaterally:  Yes  Sensation Testing  Pulse Check  Comments      PHQ2/9: Depression screen [facility] 2/9 03/24/2015 03/22/2015 02/20/2015 12/21/2014 12/06/2014  Decreased Interest 0 0 0 0 0  Down, Depressed, Hopeless 0 0 0 0 0  PHQ - 2 Score 0 0 0 0 0    Fall Risk: Fall Risk  02/20/2015 12/06/2014  Falls in the past year? No No  Risk for fall due to : - Impaired balance/gait     Assessment & Plan  1. Polymyalgia rheumatica ([place])  Keep follow up with Rheumatologist, symptoms have improved, but states gets worse when off medication -  predniSONE (DELTASONE) 5 MG tablet; Take 2 tablets (10 mg total) by mouth daily with breakfast.  Dispense: 60 tablet; Refill: 0 - predniSONE (DELTASONE) 2.5 MG tablet; Take 1 tablet (2.5 mg total) by mouth every evening.  Dispense: 30 tablet; Refill: 0  2. Diabetes mellitus without complication ([place])  Continue medication , but needs to resume Glipizide, hgbA1C is up - over 7. Likely elevated because of prednisone, monitor glucose at home and call back if any episodes of hypoglycemia - POCT HgB A1C - POCT UA - Microalbumin  3. Needs flu shot  - Flu vaccine HIGH DOSE PF (Fluzone High dose)  4. Elevated C-reactive protein  Normalized, labs done by Rheumatologist   5. Benign essential HTN  At goal   6. Asthma, moderate  Seen by pulmonologist, had pulmonary rehab feeling better  7. Hyperlipidemia  Taking medication at goal   8. Diabetic gastroparesis ([place])  Doing well

## 2015-04-26 ENCOUNTER — Ambulatory Visit
Admission: RE | Admit: 2015-04-26 | Discharge: 2015-04-26 | Disposition: A | Discharge status: Home / Self Care | Payer: Medicare Other | Source: Ambulatory Visit | Attending: Family Medicine | Admitting: Family Medicine

## 2015-04-26 ENCOUNTER — Other Ambulatory Visit: Payer: Self-pay | Admitting: Family Medicine

## 2015-04-26 DIAGNOSIS — Z1231 Encounter for screening mammogram for malignant neoplasm of breast: Secondary | ICD-10-CM

## 2015-04-27 ENCOUNTER — Ambulatory Visit (INDEPENDENT_AMBULATORY_CARE_PROVIDER_SITE_OTHER): Payer: Medicare Other

## 2015-04-27 DIAGNOSIS — Z23 Encounter for immunization: Secondary | ICD-10-CM | POA: Diagnosis not present

## 2015-05-04 ENCOUNTER — Telehealth: Payer: Self-pay | Admitting: Family Medicine

## 2015-05-04 ENCOUNTER — Other Ambulatory Visit: Payer: Self-pay | Admitting: Family Medicine

## 2015-05-04 DIAGNOSIS — Z79899 Other long term (current) drug therapy: Secondary | ICD-10-CM

## 2015-05-04 DIAGNOSIS — E2839 Other primary ovarian failure: Secondary | ICD-10-CM

## 2015-05-04 NOTE — Telephone Encounter (Signed)
I ordered it

## 2015-05-04 NOTE — Telephone Encounter (Signed)
Did you order a bone density? Her last one was normal in 2010, is she even due?

## 2015-05-04 NOTE — Telephone Encounter (Signed)
PT NEEDS A BONE DENSITY SCHEDULED. NO Tuesday MORNINGS and no Nov 30th. Her phone # is  [phone]. She also said that rheumatology has increased her prednisone to 20 mg.Just wanted to let the dr know this info.

## 2015-05-07 ENCOUNTER — Other Ambulatory Visit: Payer: Self-pay | Admitting: Family Medicine

## 2015-05-25 ENCOUNTER — Ambulatory Visit
Admission: RE | Admit: 2015-05-25 | Discharge: 2015-05-25 | Disposition: A | Discharge status: Home / Self Care | Payer: Medicare Other | Source: Ambulatory Visit | Attending: Family Medicine | Admitting: Family Medicine

## 2015-05-25 DIAGNOSIS — E2839 Other primary ovarian failure: Secondary | ICD-10-CM

## 2015-05-25 DIAGNOSIS — Z13820 Encounter for screening for osteoporosis: Secondary | ICD-10-CM | POA: Insufficient documentation

## 2015-05-25 DIAGNOSIS — Z79899 Other long term (current) drug therapy: Secondary | ICD-10-CM | POA: Insufficient documentation

## 2015-06-13 ENCOUNTER — Ambulatory Visit: Payer: Medicare Other | Admitting: Family Medicine

## 2015-06-13 ENCOUNTER — Ambulatory Visit (INDEPENDENT_AMBULATORY_CARE_PROVIDER_SITE_OTHER): Payer: Medicare Other | Admitting: Family Medicine

## 2015-06-13 ENCOUNTER — Encounter: Payer: Self-pay | Admitting: Family Medicine

## 2015-06-13 VITALS — BP 116/78 | HR 106 | Temp 98.9°F | Resp 16 | Ht 66.0 in | Wt 203.5 lb

## 2015-06-13 DIAGNOSIS — J4541 Moderate persistent asthma with (acute) exacerbation: Secondary | ICD-10-CM

## 2015-06-13 DIAGNOSIS — J069 Acute upper respiratory infection, unspecified: Secondary | ICD-10-CM

## 2015-06-13 MED ORDER — AZITHROMYCIN 250 MG PO TABS: ORAL_TABLET | ORAL | Status: DC

## 2015-06-13 NOTE — Progress Notes (Signed)
Name: Mary Cantrell   MRN: [id number]    DOB: 06-04-1946   Date:06/13/2015       Progress Note  Subjective  Chief Complaint  Chief Complaint  Patient presents with  . URI    onset 6 days unchanged.  Syptoms include: sore throat, heaviness in chect eyes burning, congestion, headache, and dry cough    HPI  URI: past 6 days she has noticed sore throat, chest tightness - congestion, eyes are burning, nasal congestion, headache and clear rhinorrhea. Productive cough with clear phlegm. She is still taking her asthma medication, and is on prednisone for Fibromyalgia Rheumatica. She is not taking otc medication. Feeling tired. Symptoms are worse in am.  Asthma with acute exacerbation: worsening of symptoms since URI with a cough, pleuritic chest pain, normal SOB   Patient Active Problem List   Diagnosis Date Noted  . Polymyalgia rheumatica ([place]) 04/13/2015  . Benign essential HTN 12/21/2014  . Back pain, chronic 12/21/2014  . Narrowing of intervertebral disc space 12/21/2014  . Diabetic gastroparesis ([place]) 12/21/2014  . Amaurosis fugax of left eye 12/21/2014  . H/O: hypothyroidism 12/21/2014  . Hyperlipidemia 12/21/2014  . IBS (irritable bowel syndrome) 12/21/2014  . Disorder of macula of retina 12/21/2014  . Asthma, moderate 12/21/2014  . NASH (nonalcoholic steatohepatitis) 12/21/2014  . NS (nuclear sclerosis) 12/21/2014  . Osteoarthrosis 12/21/2014  . Obesity (BMI 30-39.9) 12/21/2014  . Psoriasis 12/21/2014  . Knee torn cartilage 12/21/2014  . Closed dislocation of jaw 12/21/2014  . Diabetes mellitus type 2 in obese ([place]) 12/21/2014  . Obstructive apnea 01/03/2014  . History of shoulder surgery 04/23/2011    Past Surgical History  Procedure Laterality Date  . Tonsillectomy    . Abdominal hysterectomy    . Appendectomy    . Pilonidal cyst excision    . Shoulder arthroscopy Right [id number]    Dr. Mauri Pole  . Excision / curettage bone cyst finger      Left Index Finger  .  Stomach surgery  1941    3 arteries leaking in stomach after appendectomy  . Artery biopsy N/A 02/23/2015    Procedure: BIOPSY TEMPORAL ARTERY;  Surgeon: Algernon Huxley, MD;  Location: [facility] ORS;  Service: Vascular;  Laterality: N/A;    Family History  Problem Relation Age of Onset  . Rheum arthritis Father   . Diabetes Father   . Pneumonia Father   . Diabetes Sister   . Osteoarthritis Sister   . Pneumonia Mother     Social History   Social History  . Marital Status: Married    Spouse Name: N/A  . Number of Children: N/A  . Years of Education: N/A   Occupational History  . Not on file.   Social History Main Topics  . Smoking status: Never Smoker   . Smokeless tobacco: Never Used  . Alcohol Use: No  . Drug Use: No  . Sexual Activity:    Partners: Male     Comment: hysterectomy   Other Topics Concern  . Not on file   Social History Narrative     Current outpatient prescriptions:  .  albuterol (VENTOLIN HFA) 108 (90 BASE) MCG/ACT inhaler, Inhale 2 puffs into the lungs every 4 (four) hours as needed for wheezing (takes every am and then as needed.). , Disp: , Rfl:  .  aspirin 81 MG tablet, Take 81 mg by mouth every morning. , Disp: , Rfl:  .  budesonide-formoterol (SYMBICORT) 160-4.5 MCG/ACT inhaler, Inhale 2 puffs into the  lungs every morning. , Disp: , Rfl:  .  cholecalciferol (VITAMIN D) 1000 UNITS tablet, Take 1,000 Units by mouth daily., Disp: , Rfl:  .  Cinnamon 500 MG capsule, Take 500 mg by mouth every morning. , Disp: , Rfl:  .  cyanocobalamin 100 MCG tablet, Take 100 mcg by mouth every morning. , Disp: , Rfl:  .  diphenhydrAMINE (BENADRYL) 25 MG tablet, Take 25 mg by mouth at bedtime as needed (nose gets clogged up with CPAP machine). , Disp: , Rfl:  .  fluticasone (FLONASE) 50 MCG/ACT nasal spray, USE 2 SPRAYS IN EACH NOSTRIL AT BEDTIME, Disp: 48 g, Rfl: 0 .  gabapentin (NEURONTIN) 100 MG capsule, Take 1 capsule (100 mg total) by mouth 3 (three) times daily.  (Patient taking differently: Take 100 mg by mouth at bedtime. 2 capsules at bedtime), Disp: 180 capsule, Rfl: 4 .  glipiZIDE (GLIPIZIDE XL) 5 MG 24 hr tablet, Take 1 tablet (5 mg total) by mouth daily with breakfast., Disp: 30 tablet, Rfl: 0 .  GLIPIZIDE XL 5 MG 24 hr tablet, TAKE 1 TABLET DAILY WITH BREAKFAST, Disp: 90 tablet, Rfl: 0 .  glucose blood (ONE TOUCH ULTRA TEST) test strip, 1 each by Other route 2 (two) times daily. Use as instructed, Disp: 100 each, Rfl: 2 .  HYDROcodone-acetaminophen (NORCO) 5-325 MG per tablet, Take 1 tablet by mouth every 6 (six) hours as needed for moderate pain., Disp: 30 tablet, Rfl: 0 .  losartan (COZAAR) 50 MG tablet, TAKE 1 TABLET DAILY FOR BLOOD PRESSURE, Disp: 90 tablet, Rfl: 1 .  meloxicam (MOBIC) 7.5 MG tablet, Take by mouth. On hold while taking prednisone, Disp: , Rfl:  .  metFORMIN (GLUCOPHAGE-XR) 500 MG 24 hr tablet, One in am and 2 in pm (Patient taking differently: 500 mg. One in am and 2 in pm), Disp: 90 tablet, Rfl: 2 .  pravastatin (PRAVACHOL) 40 MG tablet, TAKE 1 TABLET EVERY EVENING FOR CHOLESTEROL, Disp: 90 tablet, Rfl: 3 .  predniSONE (DELTASONE) 2.5 MG tablet, Take 1 tablet (2.5 mg total) by mouth every evening., Disp: 30 tablet, Rfl: 0 .  predniSONE (DELTASONE) 5 MG tablet, Take 2 tablets (10 mg total) by mouth daily with breakfast., Disp: 60 tablet, Rfl: 0 .  ranitidine (ZANTAC) 150 MG tablet, Take 1 tablet (150 mg total) by mouth 2 (two) times daily., Disp: 60 tablet, Rfl: 0 .  spironolactone-hydrochlorothiazide (ALDACTAZIDE) 25-25 MG per tablet, TAKE ONE-HALF (1/2) TO ONE TABLET DAILY FOR BLOOD PRESSURE (Patient taking differently: TAKE ONE-HALF (1/2) TO ONE TABLET DAILY FOR BLOOD PRESSURE in am), Disp: 90 tablet, Rfl: 1 .  vitamin C (ASCORBIC ACID) 500 MG tablet, Take 1 tablet by mouth at bedtime. , Disp: , Rfl:   Allergies  Allergen Reactions  . Codeine     Other reaction(s): Other (See Comments) Dizzy  . Lantus  [Insulin Glargine]      made skin hot  . Metformin     Coated metformin is tolerated well.  . Sitagliptin   . Sulfa Antibiotics     Other reaction(s): Other (See Comments) Red burning skin     ROS  Ten systems reviewed and is negative except as mentioned in HPI   Objective  Filed Vitals:   06/13/15 1551  BP: 116/78  Pulse: 106  Temp: 98.9 F (37.2 C)  TempSrc: Oral  Resp: 16  Height: 5' 6"  (1.676 m)  Weight: 203 lb 8 oz (92.307 kg)  SpO2: 95%    Body mass index  is 32.86 kg/(m^2).  Physical Exam  Constitutional: Patient appears well-developed and well-nourished. Obese  No distress.  HEENT: head atraumatic, normocephalic, pupils equal and reactive to light, ears TM normal, neck supple, throat within normal limits Cardiovascular: Normal rate, regular rhythm and normal heart sounds.  No murmur heard. No BLE edema. Pulmonary/Chest: Effort normal and breath sounds normal. No respiratory distress. Abdominal: Soft.  There is no tenderness. Psychiatric: Patient has a normal mood and affect. behavior is normal. Judgment and thought content normal.  Recent Results (from the past 2160 hour(s))  POCT UA - Microalbumin     Status: Abnormal   Collection Time: 04/13/15  2:25 PM  Result Value Ref Range   Microalbumin Ur, POC 20 mg/L   Creatinine, POC  mg/dL   Albumin/Creatinine Ratio, Urine, POC    POCT HgB A1C     Status: Abnormal   Collection Time: 04/13/15  2:37 PM  Result Value Ref Range   Hemoglobin A1C 8.4     PHQ2/9: Depression screen [facility] 2/9 06/13/2015 03/24/2015 03/22/2015 02/20/2015 12/21/2014  Decreased Interest 0 0 0 0 0  Down, Depressed, Hopeless 0 0 0 0 0  PHQ - 2 Score 0 0 0 0 0    Fall Risk: Fall Risk  06/13/2015 02/20/2015 12/06/2014  Falls in the past year? Yes No No  Number falls in past yr: 2 or more - -  Injury with Fall? No - -  Risk for fall due to : - - Impaired balance/gait    Functional Status Survey: Is the patient deaf or have difficulty hearing?: No Does the  patient have difficulty seeing, even when wearing glasses/contacts?: Yes Does the patient have difficulty concentrating, remembering, or making decisions?: No Does the patient have difficulty walking or climbing stairs?: No Does the patient have difficulty dressing or bathing?: No Does the patient have difficulty doing errands alone such as visiting a doctor's office or shopping?: No    Assessment & Plan  1. Asthma, moderate persistent, with acute exacerbation  Use albuterol every four hours until better  2. Upper respiratory infection  Advised saline spray, may take Coricidin HBP and fill Z-pack only if sputum changes, worsening of symptoms or fever - azithromycin (ZITHROMAX) 250 MG tablet; Take as directed  Dispense: 6 tablet; Refill: 0

## 2015-06-27 ENCOUNTER — Other Ambulatory Visit: Payer: Self-pay | Admitting: Family Medicine

## 2015-06-27 NOTE — Telephone Encounter (Signed)
Patient requesting refill. 

## 2015-07-10 ENCOUNTER — Other Ambulatory Visit: Payer: Self-pay | Admitting: Family Medicine

## 2015-07-10 MED ORDER — FLUTICASONE PROPIONATE 50 MCG/ACT NA SUSP: NASAL | Status: DC

## 2015-07-10 NOTE — Telephone Encounter (Signed)
Patient requesting refill. 

## 2015-07-10 NOTE — Telephone Encounter (Signed)
Requesting refill on Fluticasone. Please send to [facility]

## 2015-07-12 ENCOUNTER — Other Ambulatory Visit: Payer: Self-pay | Admitting: Family Medicine

## 2015-07-31 LAB — HM DIABETES EYE EXAM

## 2015-08-04 LAB — HM DIABETES EYE EXAM

## 2015-08-31 ENCOUNTER — Telehealth: Payer: Self-pay

## 2015-08-31 ENCOUNTER — Encounter: Payer: Self-pay | Admitting: Family Medicine

## 2015-08-31 ENCOUNTER — Ambulatory Visit (INDEPENDENT_AMBULATORY_CARE_PROVIDER_SITE_OTHER): Payer: Medicare Other | Admitting: Family Medicine

## 2015-08-31 VITALS — BP 110/60 | HR 93 | Temp 99.2°F | Resp 14 | Wt 201.5 lb

## 2015-08-31 DIAGNOSIS — R6889 Other general symptoms and signs: Secondary | ICD-10-CM

## 2015-08-31 DIAGNOSIS — J101 Influenza due to other identified influenza virus with other respiratory manifestations: Secondary | ICD-10-CM | POA: Diagnosis not present

## 2015-08-31 MED ORDER — HYDROCOD POLST-CPM POLST ER 10-8 MG/5ML PO SUER: 5.0000 mL | Freq: Two times a day (BID) | ORAL | Status: DC | PRN

## 2015-08-31 MED ORDER — OSELTAMIVIR PHOSPHATE 75 MG PO CAPS: 75.0000 mg | ORAL_CAPSULE | Freq: Two times a day (BID) | ORAL | Status: DC

## 2015-08-31 NOTE — Progress Notes (Signed)
Name: Mary Cantrell   MRN: [id number]    DOB: Dec 27, 1945   Date:08/31/2015       Progress Note  Subjective  Chief Complaint  Chief Complaint  Patient presents with  . URI    patient stated that he sx has been on/off since her last visit. patient has been coughing up clear mucus for about 2 weeks.  . Fever    patient had a fever last night. has recently taken prednisone.  . Chest Pain    when she lays down she has excruciating pain on the right side.    HPI  Flu: she has been having symptoms of URI for the past few weeks, but over the past two days she developed clear rhinorrhea, pleuritic chest pain, a dry cough and chills. She also had a max temperature of 101. She also has fatigue, body aches. She has nausea and lack of appetite. She states chest pain is in the anterior side, triggered by taking a deep breath or laying down.    Patient Active Problem List   Diagnosis Date Noted  . Polymyalgia rheumatica ([place]) 04/13/2015  . Benign essential HTN 12/21/2014  . Back pain, chronic 12/21/2014  . Narrowing of intervertebral disc space 12/21/2014  . Diabetic gastroparesis ([place]) 12/21/2014  . Amaurosis fugax of left eye 12/21/2014  . H/O: hypothyroidism 12/21/2014  . Hyperlipidemia 12/21/2014  . IBS (irritable bowel syndrome) 12/21/2014  . Disorder of macula of retina 12/21/2014  . Asthma, moderate 12/21/2014  . NASH (nonalcoholic steatohepatitis) 12/21/2014  . NS (nuclear sclerosis) 12/21/2014  . Osteoarthrosis 12/21/2014  . Obesity (BMI 30-39.9) 12/21/2014  . Psoriasis 12/21/2014  . Knee torn cartilage 12/21/2014  . Closed dislocation of jaw 12/21/2014  . Diabetes mellitus type 2 in obese ([place]) 12/21/2014  . Obstructive apnea 01/03/2014  . History of shoulder surgery 04/23/2011    Past Surgical History  Procedure Laterality Date  . Tonsillectomy    . Abdominal hysterectomy    . Appendectomy    . Pilonidal cyst excision    . Shoulder arthroscopy Right [id number]    Dr. Mauri Pole   . Excision / curettage bone cyst finger      Left Index Finger  . Stomach surgery  1941    3 arteries leaking in stomach after appendectomy  . Artery biopsy N/A 02/23/2015    Procedure: BIOPSY TEMPORAL ARTERY;  Surgeon: Algernon Huxley, MD;  Location: [facility] ORS;  Service: Vascular;  Laterality: N/A;    Family History  Problem Relation Age of Onset  . Rheum arthritis Father   . Diabetes Father   . Pneumonia Father   . Diabetes Sister   . Osteoarthritis Sister   . Pneumonia Mother     Social History   Social History  . Marital Status: Married    Spouse Name: N/A  . Number of Children: N/A  . Years of Education: N/A   Occupational History  . Not on file.   Social History Main Topics  . Smoking status: Never Smoker   . Smokeless tobacco: Never Used  . Alcohol Use: No  . Drug Use: No  . Sexual Activity:    Partners: Male     Comment: hysterectomy   Other Topics Concern  . Not on file   Social History Narrative     Current outpatient prescriptions:  .  albuterol (VENTOLIN HFA) 108 (90 BASE) MCG/ACT inhaler, Inhale 2 puffs into the lungs every 4 (four) hours as needed for wheezing (takes every am and  then as needed.). , Disp: , Rfl:  .  aspirin 81 MG tablet, Take 81 mg by mouth every morning. , Disp: , Rfl:  .  budesonide-formoterol (SYMBICORT) 160-4.5 MCG/ACT inhaler, Inhale 2 puffs into the lungs every morning. , Disp: , Rfl:  .  chlorpheniramine-HYDROcodone (TUSSIONEX PENNKINETIC ER) 10-8 MG/5ML SUER, Take 5 mLs by mouth every 12 (twelve) hours as needed., Disp: 140 mL, Rfl: 0 .  cholecalciferol (VITAMIN D) 1000 UNITS tablet, Take 1,000 Units by mouth daily., Disp: , Rfl:  .  Cinnamon 500 MG capsule, Take 500 mg by mouth every morning. , Disp: , Rfl:  .  cyanocobalamin 100 MCG tablet, Take 100 mcg by mouth every morning. , Disp: , Rfl:  .  diphenhydrAMINE (BENADRYL) 25 MG tablet, Take 25 mg by mouth at bedtime as needed (nose gets clogged up with CPAP machine). , Disp: ,  Rfl:  .  fluticasone (FLONASE) 50 MCG/ACT nasal spray, USE 2 SPRAYS IN EACH NOSTRIL AT BEDTIME, Disp: 48 g, Rfl: 0 .  gabapentin (NEURONTIN) 100 MG capsule, Take 1 capsule (100 mg total) by mouth 3 (three) times daily. (Patient taking differently: Take 100 mg by mouth at bedtime. 2 capsules at bedtime), Disp: 180 capsule, Rfl: 4 .  GLIPIZIDE XL 5 MG 24 hr tablet, TAKE 1 TABLET DAILY WITH BREAKFAST, Disp: 90 tablet, Rfl: 0 .  glucose blood (ONE TOUCH ULTRA TEST) test strip, 1 each by Other route 2 (two) times daily. Use as instructed, Disp: 100 each, Rfl: 2 .  HYDROcodone-acetaminophen (NORCO) 5-325 MG per tablet, Take 1 tablet by mouth every 6 (six) hours as needed for moderate pain., Disp: 30 tablet, Rfl: 0 .  losartan (COZAAR) 50 MG tablet, TAKE 1 TABLET DAILY FOR BLOOD PRESSURE, Disp: 90 tablet, Rfl: 0 .  meloxicam (MOBIC) 7.5 MG tablet, TAKE 1 TABLET TWICE A DAY, Disp: 180 tablet, Rfl: 0 .  metFORMIN (GLUCOPHAGE-XR) 500 MG 24 hr tablet, One in am and 2 in pm (Patient taking differently: 500 mg. One in am and 2 in pm), Disp: 90 tablet, Rfl: 2 .  oseltamivir (TAMIFLU) 75 MG capsule, Take 1 capsule (75 mg total) by mouth 2 (two) times daily., Disp: 10 capsule, Rfl: 0 .  pravastatin (PRAVACHOL) 40 MG tablet, TAKE 1 TABLET EVERY EVENING FOR CHOLESTEROL, Disp: 90 tablet, Rfl: 3 .  predniSONE (DELTASONE) 10 MG tablet, , Disp: , Rfl:  .  predniSONE (DELTASONE) 5 MG tablet, Take 2 tablets (10 mg total) by mouth daily with breakfast., Disp: 60 tablet, Rfl: 0 .  ranitidine (ZANTAC) 150 MG tablet, Take 1 tablet (150 mg total) by mouth 2 (two) times daily., Disp: 60 tablet, Rfl: 0 .  spironolactone-hydrochlorothiazide (ALDACTAZIDE) 25-25 MG tablet, TAKE ONE-HALF (1/2) TO ONE TABLET DAILY FOR BLOOD PRESSURE, Disp: 90 tablet, Rfl: 0 .  vitamin C (ASCORBIC ACID) 500 MG tablet, Take 1 tablet by mouth at bedtime. , Disp: , Rfl:   Allergies  Allergen Reactions  . Codeine     Other reaction(s): Other (See  Comments) Dizzy  . Lantus  [Insulin Glargine]     made skin hot  . Metformin     Coated metformin is tolerated well.  . Sitagliptin   . Sulfa Antibiotics     Other reaction(s): Other (See Comments) Red burning skin     ROS  Ten systems reviewed and is negative except as mentioned in HPI   Objective  Filed Vitals:   08/31/15 1108  BP: 110/60  Pulse: 93  Temp: 99.2 F (37.3 C)  TempSrc: Oral  Resp: 14  Weight: 201 lb 8 oz (91.4 kg)  SpO2: 94%    Body mass index is 32.54 kg/(m^2).  Physical Exam  Constitutional: Patient appears well-developed and well-nourished. Obese. Looks tired HEENT: head atraumatic, normocephalic, pupils equal and reactive to light, ears normal TM bilaterally, mild clear rhinorrhea, neck supple, throat within normal limits Cardiovascular: Normal rate, regular rhythm and normal heart sounds.  No murmur heard. No BLE edema. Pulmonary/Chest: Effort normal and breath sounds normal. No respiratory distress. Abdominal: Soft.  There is no tenderness. Psychiatric: Patient has a normal mood and affect. behavior is normal. Judgment and thought content normal.  PHQ2/9: Depression screen [facility] 2/9 08/31/2015 06/13/2015 03/24/2015 03/22/2015 02/20/2015  Decreased Interest 0 0 0 0 0  Down, Depressed, Hopeless 0 0 0 0 0  PHQ - 2 Score 0 0 0 0 0     Fall Risk: Fall Risk  08/31/2015 06/13/2015 02/20/2015 12/06/2014  Falls in the past year? Yes Yes No No  Number falls in past yr: 1 2 or more - -  Injury with Fall? No No - -  Risk for fall due to : - - - Impaired balance/gait     Functional Status Survey: Is the patient deaf or have difficulty hearing?: No Does the patient have difficulty seeing, even when wearing glasses/contacts?: No (patient wears reading glasses) Does the patient have difficulty concentrating, remembering, or making decisions?: No Does the patient have difficulty walking or climbing stairs?: No Does the patient have difficulty dressing or  bathing?: No Does the patient have difficulty doing errands alone such as visiting a doctor's office or shopping?: No    Assessment & Plan  1. Influenza A  Call back for CXR if right chest pain does not resolve with medication  - oseltamivir (TAMIFLU) 75 MG capsule; Take 1 capsule (75 mg total) by mouth 2 (two) times daily.  Dispense: 10 capsule; Refill: 0 - chlorpheniramine-HYDROcodone (TUSSIONEX PENNKINETIC ER) 10-8 MG/5ML SUER; Take 5 mLs by mouth every 12 (twelve) hours as needed.  Dispense: 140 mL; Refill: 0  2. Influenza-like symptoms  - POCT Influenza A/B

## 2015-08-31 NOTE — Telephone Encounter (Signed)
Patient called stating that her polymyalgia rheumatica has flared up and she was insisting that be she be seen today. Even with both Suanne Marker and and myself both telling her to go to the nearest walk-in or urgent care she still wanted an appt today. I told her that we could put her on the cancellation list and she said that she could not even lay down and she was in so much pain. She was then strongly urged to go to the nearest ED in case stat labs or imaging was needed and she said they will be the last place for her to go.  She then called back and stated that Dr. Ancil Boozer gave her a rx for azithromycin back on 06/13/2015 but she never had it filled. I told her that i did not think she could fill it now but that she could call her pharmacy and ask them.  She was later called by Lenna Sciara at 8:35am since someone called and cancelled and asked if she could come at 11:20am.

## 2015-09-03 ENCOUNTER — Other Ambulatory Visit: Payer: Self-pay | Admitting: Family Medicine

## 2015-10-12 ENCOUNTER — Ambulatory Visit: Payer: Medicare Other | Admitting: Family Medicine

## 2015-10-31 ENCOUNTER — Encounter: Payer: Self-pay | Admitting: Family Medicine

## 2015-10-31 ENCOUNTER — Ambulatory Visit (INDEPENDENT_AMBULATORY_CARE_PROVIDER_SITE_OTHER): Payer: Medicare Other | Admitting: Family Medicine

## 2015-10-31 VITALS — BP 138/90 | HR 99 | Temp 98.9°F | Resp 16 | Ht 66.0 in | Wt 200.3 lb

## 2015-10-31 DIAGNOSIS — G8929 Other chronic pain: Secondary | ICD-10-CM

## 2015-10-31 DIAGNOSIS — E785 Hyperlipidemia, unspecified: Secondary | ICD-10-CM

## 2015-10-31 DIAGNOSIS — IMO0002 Reserved for concepts with insufficient information to code with codable children: Secondary | ICD-10-CM

## 2015-10-31 DIAGNOSIS — K589 Irritable bowel syndrome without diarrhea: Secondary | ICD-10-CM

## 2015-10-31 DIAGNOSIS — M549 Dorsalgia, unspecified: Secondary | ICD-10-CM | POA: Diagnosis not present

## 2015-10-31 DIAGNOSIS — M353 Polymyalgia rheumatica: Secondary | ICD-10-CM | POA: Diagnosis not present

## 2015-10-31 DIAGNOSIS — I10 Essential (primary) hypertension: Secondary | ICD-10-CM

## 2015-10-31 DIAGNOSIS — J4540 Moderate persistent asthma, uncomplicated: Secondary | ICD-10-CM

## 2015-10-31 DIAGNOSIS — E1165 Type 2 diabetes mellitus with hyperglycemia: Secondary | ICD-10-CM | POA: Diagnosis not present

## 2015-10-31 DIAGNOSIS — E1143 Type 2 diabetes mellitus with diabetic autonomic (poly)neuropathy: Secondary | ICD-10-CM

## 2015-10-31 LAB — POCT UA - MICROALBUMIN: MICROALBUMIN (UR) POC: NEGATIVE mg/L

## 2015-10-31 LAB — POCT GLYCOSYLATED HEMOGLOBIN (HGB A1C): HEMOGLOBIN A1C: 9.5

## 2015-10-31 MED ORDER — DICYCLOMINE HCL 20 MG PO TABS: 20.0000 mg | ORAL_TABLET | Freq: Three times a day (TID) | ORAL | Status: DC

## 2015-10-31 MED ORDER — GABAPENTIN 100 MG PO CAPS: 100.0000 mg | ORAL_CAPSULE | Freq: Every day | ORAL | Status: DC

## 2015-10-31 MED ORDER — GLIPIZIDE ER 10 MG PO TB24: 10.0000 mg | ORAL_TABLET | Freq: Every day | ORAL | Status: DC

## 2015-10-31 MED ORDER — METFORMIN HCL ER 500 MG PO TB24: 500.0000 mg | ORAL_TABLET | Freq: Two times a day (BID) | ORAL | Status: DC

## 2015-10-31 MED ORDER — SPIRONOLACTONE-HCTZ 25-25 MG PO TABS: ORAL_TABLET | ORAL | Status: DC

## 2015-10-31 NOTE — Progress Notes (Signed)
Name: Mary Cantrell   MRN: [id number]    DOB: 05-25-1946   Date:10/31/2015       Progress Note  Subjective  Chief Complaint  Chief Complaint  Patient presents with  . Medication Refill    6 month F/U  . Hypertension    Losing weight and exercising daily on treadmill for 20 mins, no problems with BP.  . Diabetes    Checks BS every morning, Low-90's Average-120-130 High-180, Patient states since her Arthritis has been bother her she has been on long term Prednisone.   . Irritable Bowel Syndrome    Patient has been diagnosed in the past and since catching the Flu in February has been battling the constant diarrhea and abdominal pain.  Marland Kitchen Hyperlipidemia    HPI  Polymyalgia rheumatica: Diagnosed in August 2016. Seen by Rheumatologist in [place] She was trying to wean off Prednisone from 10 mg to 7.5 mg over the past 2 weeks but woke up this am in some much pain that she is back on 10 mg.  She states that feels very stiff in am's, and if she sits down for a prolonged period of time. Her pain was 10/10 this morning but is down 6/10.   Negative temporal artery biopsy.   HTN: bp has been good, however slightly up today because she is in pain, taking medication as prescribed, no chest pain or palpitation  Asthma Moderate: seen by pulmonologist, completed pulmonary rehab and is feeling better, she is doing well since taking Prednisone for Polymyalgia Rheumatica  Hyperlipidemia: taking medication as prescribed and is doing well, no side effects.   DM II gastroparesis: she is only taking Metformin twice daily and Glipizide 5 mg and denies side effects. Glucose at home has been 120-130's fasting, occasionally 180 , not checking post-prandially. She has lost some weight, she has polyuria, but no nocturia. . She has bloating  IBS: she was diagnosed in her 39's, she used to take Bentyl, symptoms improved after she retired in 2009, still triggered by salads. She has been very stressed about her  grand-daughter will be without insurance since she turned 44 and ObamaCare is getting repealed. She has noticed abdominal cramping, episodes of constipation and diarrhea. No fever, no blood in stools.    Patient Active Problem List   Diagnosis Date Noted  . Polymyalgia rheumatica ([place]) 04/13/2015  . Benign essential HTN 12/21/2014  . Back pain, chronic 12/21/2014  . Narrowing of intervertebral disc space 12/21/2014  . Diabetic gastroparesis ([place]) 12/21/2014  . Amaurosis fugax of left eye 12/21/2014  . H/O: hypothyroidism 12/21/2014  . Hyperlipidemia 12/21/2014  . IBS (irritable bowel syndrome) 12/21/2014  . Disorder of macula of retina 12/21/2014  . Asthma, moderate 12/21/2014  . NASH (nonalcoholic steatohepatitis) 12/21/2014  . NS (nuclear sclerosis) 12/21/2014  . Osteoarthrosis 12/21/2014  . Obesity (BMI 30-39.9) 12/21/2014  . Psoriasis 12/21/2014  . Knee torn cartilage 12/21/2014  . Closed dislocation of jaw 12/21/2014  . Diabetes mellitus type 2 in obese ([place]) 12/21/2014  . Obstructive apnea 01/03/2014  . History of shoulder surgery 04/23/2011    Past Surgical History  Procedure Laterality Date  . Tonsillectomy    . Abdominal hysterectomy    . Appendectomy    . Pilonidal cyst excision    . Shoulder arthroscopy Right [id number]    Dr. Mauri Pole  . Excision / curettage bone cyst finger      Left Index Finger  . Stomach surgery  1941    3 arteries leaking  in stomach after appendectomy  . Artery biopsy N/A 02/23/2015    Procedure: BIOPSY TEMPORAL ARTERY;  Surgeon: Algernon Huxley, MD;  Location: [facility] ORS;  Service: Vascular;  Laterality: N/A;    Family History  Problem Relation Age of Onset  . Rheum arthritis Father   . Diabetes Father   . Pneumonia Father   . Diabetes Sister   . Osteoarthritis Sister   . Pneumonia Mother     Social History   Social History  . Marital Status: Married    Spouse Name: N/A  . Number of Children: N/A  . Years of Education: N/A    Occupational History  . Not on file.   Social History Main Topics  . Smoking status: Never Smoker   . Smokeless tobacco: Never Used  . Alcohol Use: No  . Drug Use: No  . Sexual Activity:    Partners: Male     Comment: hysterectomy   Other Topics Concern  . Not on file   Social History Narrative     Current outpatient prescriptions:  .  albuterol (VENTOLIN HFA) 108 (90 BASE) MCG/ACT inhaler, Inhale 2 puffs into the lungs every 4 (four) hours as needed for wheezing (takes every am and then as needed.). , Disp: , Rfl:  .  aspirin 81 MG tablet, Take 81 mg by mouth every morning. , Disp: , Rfl:  .  cholecalciferol (VITAMIN D) 1000 UNITS tablet, Take 1,000 Units by mouth daily., Disp: , Rfl:  .  Cinnamon 500 MG capsule, Take 500 mg by mouth every morning. , Disp: , Rfl:  .  cyanocobalamin 100 MCG tablet, Take 100 mcg by mouth every morning. , Disp: , Rfl:  .  diphenhydrAMINE (BENADRYL) 25 MG tablet, Take 25 mg by mouth at bedtime as needed (nose gets clogged up with CPAP machine). , Disp: , Rfl:  .  fluticasone (FLONASE) 50 MCG/ACT nasal spray, USE 2 SPRAYS IN EACH NOSTRIL AT BEDTIME, Disp: 48 g, Rfl: 0 .  gabapentin (NEURONTIN) 100 MG capsule, Take 1 capsule (100 mg total) by mouth at bedtime. 2 capsules at bedtime, Disp: 180 capsule, Rfl: 4 .  glipiZIDE (GLUCOTROL XL) 10 MG 24 hr tablet, Take 1 tablet (10 mg total) by mouth daily with breakfast., Disp: 90 tablet, Rfl: 0 .  glucose blood (ONE TOUCH ULTRA TEST) test strip, 1 each by Other route 2 (two) times daily. Use as instructed, Disp: 100 each, Rfl: 2 .  HYDROcodone-acetaminophen (NORCO) 5-325 MG per tablet, Take 1 tablet by mouth every 6 (six) hours as needed for moderate pain., Disp: 30 tablet, Rfl: 0 .  losartan (COZAAR) 50 MG tablet, TAKE 1 TABLET DAILY FOR BLOOD PRESSURE, Disp: 90 tablet, Rfl: 0 .  meloxicam (MOBIC) 7.5 MG tablet, TAKE 1 TABLET TWICE A DAY, Disp: 180 tablet, Rfl: 0 .  metFORMIN (GLUCOPHAGE-XR) 500 MG 24 hr  tablet, Take 1-2 tablets (500-1,000 mg total) by mouth 2 (two) times daily. One in am and 2 in pm, Disp: 270 tablet, Rfl: 1 .  montelukast (SINGULAIR) 10 MG tablet, Take by mouth., Disp: , Rfl:  .  pravastatin (PRAVACHOL) 40 MG tablet, TAKE 1 TABLET EVERY EVENING FOR CHOLESTEROL, Disp: 90 tablet, Rfl: 3 .  predniSONE (DELTASONE) 2.5 MG tablet, , Disp: , Rfl:  .  predniSONE (DELTASONE) 5 MG tablet, Take 2 tablets (10 mg total) by mouth daily with breakfast., Disp: 60 tablet, Rfl: 0 .  ranitidine (ZANTAC) 150 MG tablet, Take 1 tablet (150 mg total) by  mouth 2 (two) times daily., Disp: 60 tablet, Rfl: 0 .  spironolactone-hydrochlorothiazide (ALDACTAZIDE) 25-25 MG tablet, TAKE ONE-HALF (1/2) TO ONE TABLET DAILY FOR BLOOD PRESSURE, Disp: 90 tablet, Rfl: 0 .  vitamin C (ASCORBIC ACID) 500 MG tablet, Take 1 tablet by mouth at bedtime. , Disp: , Rfl:  .  budesonide-formoterol (SYMBICORT) 160-4.5 MCG/ACT inhaler, Inhale 2 puffs into the lungs every morning. , Disp: , Rfl:  .  dicyclomine (BENTYL) 20 MG tablet, Take 1 tablet (20 mg total) by mouth 4 (four) times daily -  before meals and at bedtime., Disp: 100 tablet, Rfl: 0  Allergies  Allergen Reactions  . Codeine     Other reaction(s): Other (See Comments) Dizzy  . Lantus  [Insulin Glargine]     made skin hot  . Metformin     Coated metformin is tolerated well.  . Sitagliptin   . Sulfa Antibiotics     Other reaction(s): Other (See Comments) Red burning skin     ROS  Constitutional: Negative for fever or significant weight change.  Respiratory: Negative for cough and shortness of breath.   Cardiovascular: Negative for chest pain or palpitations.  Gastrointestinal: Negative for abdominal pain, no bowel changes.  Musculoskeletal: Positive  for gait problem or joint swelling.  Skin: Negative for rash.  Neurological: Negative for dizziness or headache.  No other specific complaints in a complete review of systems (except as listed in HPI  above).  Objective  Filed Vitals:   10/31/15 1336  BP: 138/90  Pulse: 99  Temp: 98.9 F (37.2 C)  TempSrc: Oral  Resp: 16  Height: 5' 6"  (1.676 m)  Weight: 200 lb 4.8 oz (90.855 kg)  SpO2: 96%    Body mass index is 32.34 kg/(m^2).  Physical Exam  Constitutional: Patient appears well-developed and well-nourished. Obese No distress.  HEENT: head atraumatic, normocephalic, pupils equal and reactive to light,  neck supple, throat within normal limits Cardiovascular: Normal rate, regular rhythm and normal heart sounds.  No murmur heard. No BLE edema. Pulmonary/Chest: Effort normal and breath sounds normal. No respiratory distress. Abdominal: Soft.  There is no tenderness. Psychiatric: Patient has been anxious. behavior is normal. Judgment and thought content normal.   Recent Results (from the past 2160 hour(s))  POCT HgB A1C     Status: Abnormal   Collection Time: 10/31/15  1:37 PM  Result Value Ref [place]   Hemoglobin A1C 9.5   POCT UA - Microalbumin     Status: Normal   Collection Time: 10/31/15  1:37 PM  Result Value Ref [place]   Microalbumin Ur, POC negative mg/L   Creatinine, POC  mg/dL   Albumin/Creatinine Ratio, Urine, POC       PHQ2/9: Depression screen [facility] 2/9 10/31/2015 08/31/2015 06/13/2015 03/24/2015 03/22/2015  Decreased Interest 0 0 0 0 0  Down, Depressed, Hopeless 0 0 0 0 0  PHQ - 2 Score 0 0 0 0 0     Fall Risk: Fall Risk  10/31/2015 08/31/2015 06/13/2015 02/20/2015 12/06/2014  Falls in the past year? No Yes Yes No No  Number falls in past yr: - 1 2 or more - -  Injury with Fall? - No No - -  Risk for fall due to : - - - - Impaired balance/gait     Functional Status Survey: Is the patient deaf or have difficulty hearing?: No Does the patient have difficulty seeing, even when wearing glasses/contacts?: No Does the patient have difficulty concentrating, remembering, or making decisions?: No  Does the patient have difficulty walking or climbing stairs?:  No Does the patient have difficulty dressing or bathing?: No Does the patient have difficulty doing errands alone such as visiting a doctor's office or shopping?: No    Assessment & Plan  1. Uncontrolled type 2 diabetes mellitus with gastroparesis (HCC)  - POCT HgB A1C - POCT UA - Microalbumin - glipiZIDE (GLUCOTROL XL) 10 MG 24 hr tablet; Take 1 tablet (10 mg total) by mouth daily with breakfast.  Dispense: 90 tablet; Refill: 0 - metFORMIN (GLUCOPHAGE-XR) 500 MG 24 hr tablet; Take 1-2 tablets (500-1,000 mg total) by mouth 2 (two) times daily. One in am and 2 in pm  Dispense: 270 tablet; Refill: 1  2. Polymyalgia rheumatica ([place])  Continue follow up with Rheumatologist   3. Hyperlipidemia  Continue statin therapy   4. IBS (irritable bowel syndrome)  - dicyclomine (BENTYL) 20 MG tablet; Take 1 tablet (20 mg total) by mouth 4 (four) times daily -  before meals and at bedtime.  Dispense: 100 tablet; Refill: 0  5. Asthma, moderate persistent, without acute exacerbation  Continue follow up with Dr. Raul Del  6. Back pain, chronic  - gabapentin (NEURONTIN) 100 MG capsule; Take 1 capsule (100 mg total) by mouth at bedtime. 2 capsules at bedtime  Dispense: 180 capsule; Refill: 4  7. Hypertension, benign  - spironolactone-hydrochlorothiazide (ALDACTAZIDE) 25-25 MG tablet; TAKE ONE-HALF (1/2) TO ONE TABLET DAILY FOR BLOOD PRESSURE  Dispense: 90 tablet; Refill: 0

## 2015-11-01 ENCOUNTER — Telehealth: Payer: Self-pay

## 2015-11-01 NOTE — Telephone Encounter (Signed)
Erroneous entry

## 2015-11-22 ENCOUNTER — Other Ambulatory Visit: Payer: Self-pay | Admitting: Family Medicine

## 2015-11-22 NOTE — Telephone Encounter (Signed)
Patient requesting refill. 

## 2015-12-08 ENCOUNTER — Telehealth: Payer: Self-pay | Admitting: Family Medicine

## 2015-12-08 NOTE — Telephone Encounter (Signed)
Patient notified by voicemail.

## 2015-12-08 NOTE — Telephone Encounter (Signed)
Seeing Dr Dossie Der in [place] for [facility]. They are suggesting for her to take Methotrexate and Folic Acid instead of prednisone wanting to whine her off prednisone. Patient would like your opinion because she has never heard of this medication.

## 2015-12-08 NOTE — Telephone Encounter (Signed)
Methotrexate is an old medication used for autoimmune disorders, including RA. It can suppress immune system, but Prednisone can also do that. She can do some research online, but ultimately it will be her choice

## 2015-12-15 ENCOUNTER — Other Ambulatory Visit: Payer: Self-pay | Admitting: Family Medicine

## 2015-12-20 ENCOUNTER — Other Ambulatory Visit: Payer: Self-pay | Admitting: Family Medicine

## 2015-12-20 NOTE — Telephone Encounter (Signed)
Patient requesting refill. 

## 2015-12-27 ENCOUNTER — Other Ambulatory Visit: Payer: Self-pay | Admitting: Family Medicine

## 2015-12-28 NOTE — Telephone Encounter (Signed)
Patient requesting refill. 

## 2016-01-22 ENCOUNTER — Other Ambulatory Visit: Payer: Self-pay | Admitting: Family Medicine

## 2016-01-22 DIAGNOSIS — IMO0002 Reserved for concepts with insufficient information to code with codable children: Secondary | ICD-10-CM

## 2016-01-22 DIAGNOSIS — E1143 Type 2 diabetes mellitus with diabetic autonomic (poly)neuropathy: Secondary | ICD-10-CM

## 2016-01-22 DIAGNOSIS — E1165 Type 2 diabetes mellitus with hyperglycemia: Principal | ICD-10-CM

## 2016-01-22 DIAGNOSIS — I10 Essential (primary) hypertension: Secondary | ICD-10-CM

## 2016-01-23 NOTE — Telephone Encounter (Signed)
Patient requesting refill. 

## 2016-01-24 ENCOUNTER — Other Ambulatory Visit: Payer: Self-pay

## 2016-01-24 MED ORDER — DICYCLOMINE HCL 20 MG PO TABS: ORAL_TABLET | ORAL | 0 refills | Status: DC

## 2016-01-24 NOTE — Telephone Encounter (Signed)
Patient requesting refill. Dicyclomine Tablets with a 90 day supply

## 2016-02-15 ENCOUNTER — Other Ambulatory Visit: Payer: Self-pay | Admitting: Family Medicine

## 2016-02-15 ENCOUNTER — Encounter: Payer: Self-pay | Admitting: Family Medicine

## 2016-02-15 NOTE — Telephone Encounter (Signed)
Patient requesting refill of One Touch Ultra Test be sent to [facility].

## 2016-02-26 ENCOUNTER — Other Ambulatory Visit: Payer: Self-pay | Admitting: Family Medicine

## 2016-02-27 ENCOUNTER — Other Ambulatory Visit: Payer: Self-pay | Admitting: Family Medicine

## 2016-02-28 NOTE — Telephone Encounter (Signed)
Patient requesting refill of Bentyl and Meloxicam to [facility].

## 2016-03-06 ENCOUNTER — Other Ambulatory Visit: Payer: Self-pay | Admitting: Family Medicine

## 2016-03-06 NOTE — Telephone Encounter (Signed)
Patient requesting refill of Bentyl be sent to [facility].

## 2016-03-13 ENCOUNTER — Ambulatory Visit (INDEPENDENT_AMBULATORY_CARE_PROVIDER_SITE_OTHER): Payer: Medicare Other | Admitting: Family Medicine

## 2016-03-13 ENCOUNTER — Encounter: Payer: Self-pay | Admitting: Family Medicine

## 2016-03-13 VITALS — BP 120/74 | HR 88 | Temp 98.1°F | Resp 18 | Ht 66.0 in | Wt 197.5 lb

## 2016-03-13 DIAGNOSIS — E669 Obesity, unspecified: Secondary | ICD-10-CM

## 2016-03-13 DIAGNOSIS — E1169 Type 2 diabetes mellitus with other specified complication: Secondary | ICD-10-CM

## 2016-03-13 DIAGNOSIS — I10 Essential (primary) hypertension: Secondary | ICD-10-CM

## 2016-03-13 DIAGNOSIS — IMO0002 Reserved for concepts with insufficient information to code with codable children: Secondary | ICD-10-CM

## 2016-03-13 DIAGNOSIS — E119 Type 2 diabetes mellitus without complications: Secondary | ICD-10-CM

## 2016-03-13 DIAGNOSIS — E785 Hyperlipidemia, unspecified: Secondary | ICD-10-CM | POA: Diagnosis not present

## 2016-03-13 DIAGNOSIS — J4540 Moderate persistent asthma, uncomplicated: Secondary | ICD-10-CM | POA: Diagnosis not present

## 2016-03-13 DIAGNOSIS — Z23 Encounter for immunization: Secondary | ICD-10-CM | POA: Diagnosis not present

## 2016-03-13 DIAGNOSIS — E1143 Type 2 diabetes mellitus with diabetic autonomic (poly)neuropathy: Secondary | ICD-10-CM

## 2016-03-13 DIAGNOSIS — E1165 Type 2 diabetes mellitus with hyperglycemia: Secondary | ICD-10-CM

## 2016-03-13 DIAGNOSIS — M353 Polymyalgia rheumatica: Secondary | ICD-10-CM | POA: Diagnosis not present

## 2016-03-13 LAB — POCT GLYCOSYLATED HEMOGLOBIN (HGB A1C): HEMOGLOBIN A1C: 8.3

## 2016-03-13 MED ORDER — GLIPIZIDE ER 10 MG PO TB24: 20.0000 mg | ORAL_TABLET | Freq: Every day | ORAL | 0 refills | Status: DC

## 2016-03-13 MED ORDER — METHOTREXATE 2.5 MG PO TABS: 2.5000 mg | ORAL_TABLET | ORAL | 0 refills | Status: DC

## 2016-03-13 MED ORDER — METFORMIN HCL ER 500 MG PO TB24: 500.0000 mg | ORAL_TABLET | Freq: Two times a day (BID) | ORAL | 0 refills | Status: DC

## 2016-03-13 NOTE — Progress Notes (Signed)
Name: Mary Cantrell   MRN: [id number]    DOB: 14-Feb-1946   Date:03/13/2016       Progress Note  Subjective  Chief Complaint  Chief Complaint  Patient presents with  . Medication Refill    4 month F/U  . Diabetes    Checks every morning, L-88 Average-120 High-170    HPI  Polymyalgia rheumatica: Diagnosed in August 2016. Seen Dr. Dossie Der, Rheumatologist in [place] She was trying to wean off Prednisone from 10 mg for the second time, but pain got worse again so she is back on 10 mg daily and has follow up this Friday with Dr. Dossie Der.   She states that feels very stiff in am's, and if she sits down for a prolonged period of time. Her pain was 5-6/10, and throughout the day it gets better, pain is nagging and aching. Negative temporal artery biopsy.   HTN: bp has been good, no side effects, no chest pain or palpitation   Asthma Moderate: seen by pulmonologist Dr. Raul Del,, completed pulmonary rehab Summer 2016, and currently walks on her treadmill daily,  and is feeling better, she is doing well since taking Prednisone for Polymyalgia Rheumatica  Hyperlipidemia: taking medication as prescribed and is doing well, no side effects.   DM II gastroparesis: she is  taking Metformin 1500 mg daily  and Glipizide 10 mg XL ( she has been taking 10  Mg by mistake ) , but is keeping glucose to goal , fasting from 88/150, usually 105 .She still has some intermittent bloating, seen by GI in the past, off Ranitidine now, stable.   IBS: she was diagnosed in her 41's, she used to take Bentyl, symptoms improved after she retired in 2009, still triggered by salads and a little worse when started on Prednisone. She has noticed abdominal cramping, episodes of constipation and diarrhea. No fever, no blood in stools.   Patient Active Problem List   Diagnosis Date Noted  . Polymyalgia rheumatica ([place]) 04/13/2015  . Benign essential HTN 12/21/2014  . Back pain, chronic 12/21/2014  . Narrowing of intervertebral  disc space 12/21/2014  . Diabetic gastroparesis ([place]) 12/21/2014  . Amaurosis fugax of left eye 12/21/2014  . H/O: hypothyroidism 12/21/2014  . Hyperlipidemia 12/21/2014  . IBS (irritable bowel syndrome) 12/21/2014  . Disorder of macula of retina 12/21/2014  . Asthma, moderate 12/21/2014  . NASH (nonalcoholic steatohepatitis) 12/21/2014  . NS (nuclear sclerosis) 12/21/2014  . Osteoarthrosis 12/21/2014  . Obesity (BMI 30-39.9) 12/21/2014  . Psoriasis 12/21/2014  . Knee torn cartilage 12/21/2014  . Closed dislocation of jaw 12/21/2014  . Diabetes mellitus type 2 in obese ([place]) 12/21/2014  . Obstructive apnea 01/03/2014  . History of shoulder surgery 04/23/2011    Past Surgical History:  Procedure Laterality Date  . ABDOMINAL HYSTERECTOMY    . APPENDECTOMY    . ARTERY BIOPSY N/A 02/23/2015   Procedure: BIOPSY TEMPORAL ARTERY;  Surgeon: Algernon Huxley, MD;  Location: [facility] ORS;  Service: Vascular;  Laterality: N/A;  . EXCISION / CURETTAGE BONE CYST FINGER     Left Index Finger  . PILONIDAL CYST EXCISION    . SHOULDER ARTHROSCOPY Right [id number]   Dr. Mauri Pole  . STOMACH SURGERY  1941   3 arteries leaking in stomach after appendectomy  . TONSILLECTOMY      Family History  Problem Relation Age of Onset  . Rheum arthritis Father   . Diabetes Father   . Pneumonia Father   . Diabetes Sister   .  Osteoarthritis Sister   . Pneumonia Mother     Social History   Social History  . Marital status: Married    Spouse name: N/A  . Number of children: N/A  . Years of education: N/A   Occupational History  . Not on file.   Social History Main Topics  . Smoking status: Never Smoker  . Smokeless tobacco: Never Used  . Alcohol use No  . Drug use: No  . Sexual activity: Yes    Partners: Male     Comment: hysterectomy   Other Topics Concern  . Not on file   Social History Narrative  . No narrative on file     Current Outpatient Prescriptions:  .  metFORMIN (GLUCOPHAGE-XR) 500  MG 24 hr tablet, Take 1 tablet (500 mg total) by mouth 2 (two) times daily. Take 1 tablet in the morning and 2 tablets at bedtime., Disp: 270 tablet, Rfl: 0 .  albuterol (VENTOLIN HFA) 108 (90 BASE) MCG/ACT inhaler, Inhale 2 puffs into the lungs every 4 (four) hours as needed for wheezing (takes every am and then as needed.). , Disp: , Rfl:  .  aspirin 81 MG tablet, Take 81 mg by mouth every morning. , Disp: , Rfl:  .  budesonide-formoterol (SYMBICORT) 160-4.5 MCG/ACT inhaler, Inhale 2 puffs into the lungs every morning. , Disp: , Rfl:  .  Calcium Carbonate-Vit D-Min ([facility] CALCIUM 600 + D/MINERALS) 600-400 MG-UNIT TABS, Take 1 tablet by mouth daily., Disp: , Rfl: 1 .  Cinnamon 500 MG capsule, Take 1 capsule by mouth 2 (two) times daily., Disp: , Rfl:  .  cyanocobalamin 100 MCG tablet, Take 100 mcg by mouth every morning. , Disp: , Rfl:  .  dicyclomine (BENTYL) 20 MG tablet, TAKE 1 TABLET FOUR TIMES A DAY BEFORE MEALS AND AT BEDTIME, Disp: 100 tablet, Rfl: 0 .  fluticasone (FLONASE) 50 MCG/ACT nasal spray, USE 2 SPRAYS IN EACH NOSTRIL AT BEDTIME, Disp: 48 g, Rfl: 1 .  folic acid (FOLVITE) 1 MG tablet, , Disp: , Rfl:  .  gabapentin (NEURONTIN) 100 MG capsule, Take 1 capsule (100 mg total) by mouth at bedtime. 2 capsules at bedtime, Disp: 180 capsule, Rfl: 4 .  glipiZIDE (GLUCOTROL XL) 10 MG 24 hr tablet, Take 2 tablets (20 mg total) by mouth daily with breakfast., Disp: 180 tablet, Rfl: 0 .  glucose blood (ONE TOUCH ULTRA TEST) test strip, 1 each by Other route 2 (two) times daily. Use as instructed, Disp: 100 each, Rfl: 2 .  losartan (COZAAR) 50 MG tablet, TAKE 1 TABLET DAILY FOR BLOOD PRESSURE, Disp: 90 tablet, Rfl: 1 .  meloxicam (MOBIC) 7.5 MG tablet, , Disp: , Rfl:  .  methotrexate (RHEUMATREX) 2.5 MG tablet, Take 1 tablet (2.5 mg total) by mouth once a week. Caution:Chemotherapy. Protect from light., Disp: 6 tablet, Rfl: 0 .  montelukast (SINGULAIR) 10 MG tablet, Take by mouth., Disp: , Rfl:  .   ONE TOUCH ULTRA TEST test strip, USE 1 EACH BY OTHER ROUTE 2 (TWO) TIMES DAILY. USE AS INSTRUCTED, Disp: 100 each, Rfl: 2 .  pravastatin (PRAVACHOL) 40 MG tablet, TAKE 1 TABLET EVERY EVENING FOR CHOLESTEROL, Disp: 90 tablet, Rfl: 3 .  spironolactone-hydrochlorothiazide (ALDACTAZIDE) 25-25 MG tablet, TAKE ONE-HALF (1/2) TO ONE TABLET DAILY FOR BLOOD PRESSURE, Disp: 90 tablet, Rfl: 0 .  vitamin C (ASCORBIC ACID) 500 MG tablet, Take 1 tablet by mouth at bedtime. , Disp: , Rfl:   Allergies  Allergen Reactions  . Codeine  Other reaction(s): Other (See Comments) Dizzy  . Lantus  [Insulin Glargine]     made skin hot  . Metformin     Coated metformin is tolerated well.  . Sitagliptin   . Sulfa Antibiotics     Other reaction(s): Other (See Comments) Red burning skin     ROS  Constitutional: Negative for fever or weight change.  Respiratory: Negative for cough and shortness of breath.   Cardiovascular: Negative for chest pain or palpitations.  Gastrointestinal: Negative for abdominal pain, no bowel changes. Stable IBS  Musculoskeletal: Negative for gait problem or joint swelling.  Skin: Negative for rash.  Neurological: Negative for dizziness or headache.  No other specific complaints in a complete review of systems (except as listed in HPI above).  Objective  Vitals:   03/13/16 1348  BP: 120/74  Pulse: 88  Resp: 18  Temp: 98.1 F (36.7 C)  TempSrc: Oral  SpO2: 96%  Weight: 197 lb 8 oz (89.6 kg)  Height: 5' 6"  (1.676 m)    Body mass index is 31.88 kg/m.  Physical Exam  Constitutional: Patient appears well-developed and well-nourished. Obese  No distress.  HEENT: head atraumatic, normocephalic, pupils equal and reactive to light, e neck supple, throat within normal limits Cardiovascular: Normal rate, regular rhythm and normal heart sounds.  No murmur heard. No BLE edema. Pulmonary/Chest: Effort normal and breath sounds normal. No respiratory distress. Abdominal: Soft.   There is no tenderness. Psychiatric: Patient has a normal mood and affect. behavior is normal. Judgment and thought content normal. Muscular Skeletal: normal rom of shoulders and hip, no synovitis noticed  PHQ2/9: Depression screen [facility] 2/9 03/13/2016 10/31/2015 08/31/2015 06/13/2015 03/24/2015  Decreased Interest 0 0 0 0 0  Down, Depressed, Hopeless 0 0 0 0 0  PHQ - 2 Score 0 0 0 0 0     Fall Risk: Fall Risk  03/13/2016 10/31/2015 08/31/2015 06/13/2015 02/20/2015  Falls in the past year? Yes No Yes Yes No  Number falls in past yr: 1 - 1 2 or more -  Injury with Fall? Yes - No No -  Risk for fall due to : - - - - -      Functional Status Survey: Is the patient deaf or have difficulty hearing?: Yes (Bilateral hearing aids) Does the patient have difficulty seeing, even when wearing glasses/contacts?: Yes (Patient is growing cataracts in bilateral eyes) Does the patient have difficulty concentrating, remembering, or making decisions?: No Does the patient have difficulty walking or climbing stairs?: Yes (Right knee due to having a torn meniscus) Does the patient have difficulty dressing or bathing?: No Does the patient have difficulty doing errands alone such as visiting a doctor's office or shopping?: No    Assessment & Plan  1. Diabetes mellitus type 2 in obese (HCC)  - POCT HgB A1C - glipiZIDE (GLUCOTROL XL) 10 MG 24 hr tablet; Take 2 tablets (20 mg total) by mouth daily with breakfast.  Dispense: 180 tablet; Refill: 0 - metFORMIN (GLUCOPHAGE-XR) 500 MG 24 hr tablet; Take 1 tablet (500 mg total) by mouth 2 (two) times daily. Take 1 tablet in the morning and 2 tablets at bedtime.  Dispense: 270 tablet; Refill: 0  2. Needs flu shot  - Flu vaccine HIGH DOSE PF (Fluzone High dose)  3. Hyperlipidemia  - Lipid panel  4. Polymyalgia rheumatica (HCC)  Continue follow up with Dr. Dossie Der  5. Uncontrolled type 2 diabetes mellitus with gastroparesis ([place])  She is taking Glucotrol twice  daily, advised to take it in am, and monitor for hypoglycemia, hgbA1C is improving but still not at goal, we will go down on the dose once she decreases dose of prednisone - glipiZIDE (GLUCOTROL XL) 10 MG 24 hr tablet; Take 2 tablets (20 mg total) by mouth daily with breakfast.  Dispense: 180 tablet; Refill: 0 - metFORMIN (GLUCOPHAGE-XR) 500 MG 24 hr tablet; Take 1 tablet (500 mg total) by mouth 2 (two) times daily. Take 1 tablet in the morning and 2 tablets at bedtime.  Dispense: 270 tablet; Refill: 0  6. Asthma, moderate persistent, uncomplicated  Sees Dr. Raul Del and is doing well   7. Hypertension, benign  controlled - COMPLETE METABOLIC PANEL WITH GFR

## 2016-03-14 LAB — HEPATIC FUNCTION PANEL
ALT: 25 U/L (ref 7–35)
AST: 22 U/L (ref 13–35)
Alkaline Phosphatase: 73 U/L (ref 25–125)
Bilirubin, Total: 0.5 mg/dL

## 2016-03-14 LAB — BASIC METABOLIC PANEL
CREATININE: 0.8 mg/dL (ref 0.5–1.1)
Glucose: 125 mg/dL
POTASSIUM: 3.7 mmol/L (ref 3.4–5.3)
Sodium: 144 mmol/L (ref 137–147)

## 2016-03-14 LAB — LIPID PANEL
CHOLESTEROL: 141 mg/dL (ref 0–200)
HDL: 51 mg/dL (ref 35–70)
LDL Cholesterol: 57 mg/dL
TRIGLYCERIDES: 166 mg/dL — AB (ref 40–160)

## 2016-03-15 ENCOUNTER — Other Ambulatory Visit: Payer: Self-pay | Admitting: Emergency Medicine

## 2016-03-15 ENCOUNTER — Encounter: Payer: Self-pay | Admitting: Family Medicine

## 2016-03-15 DIAGNOSIS — E1143 Type 2 diabetes mellitus with diabetic autonomic (poly)neuropathy: Secondary | ICD-10-CM

## 2016-03-15 DIAGNOSIS — E669 Obesity, unspecified: Principal | ICD-10-CM

## 2016-03-15 DIAGNOSIS — E1165 Type 2 diabetes mellitus with hyperglycemia: Secondary | ICD-10-CM

## 2016-03-15 DIAGNOSIS — E1169 Type 2 diabetes mellitus with other specified complication: Secondary | ICD-10-CM

## 2016-03-15 DIAGNOSIS — IMO0002 Reserved for concepts with insufficient information to code with codable children: Secondary | ICD-10-CM

## 2016-03-15 MED ORDER — METFORMIN HCL ER 500 MG PO TB24: 500.0000 mg | ORAL_TABLET | Freq: Three times a day (TID) | ORAL | 0 refills | Status: DC

## 2016-03-18 ENCOUNTER — Other Ambulatory Visit: Payer: Self-pay | Admitting: Family Medicine

## 2016-03-19 ENCOUNTER — Other Ambulatory Visit: Payer: Self-pay | Admitting: Family Medicine

## 2016-03-28 ENCOUNTER — Telehealth: Payer: Self-pay

## 2016-03-28 ENCOUNTER — Telehealth: Payer: Self-pay | Admitting: Family Medicine

## 2016-03-28 NOTE — Telephone Encounter (Signed)
Glucose is at goal for her, normal liver enzymes and lipid panel has improved. LDL is at goal, HDL is good. Triglycerides is better but still above goal. Continue life style modification

## 2016-03-28 NOTE — Telephone Encounter (Signed)
Patient requested a call about her labs Only partial results showing up in labs, so full report requested by staff (see printed copy) TG > 150, limit starches, fried foods She is on prednisone She requested a copy of her labs be sent to Dr. Dossie Der, rheum in [place] I will leave copy of labs for Dr. Ancil Boozer to review when she returns

## 2016-03-28 NOTE — Telephone Encounter (Signed)
Patient called and states she had blood work done 2 weeks ago and still hasn't received a call about them. Please annotate them so I can inform patient. Thanks.

## 2016-03-29 NOTE — Telephone Encounter (Signed)
Patient notified of lab results by phone.

## 2016-04-02 LAB — HM DIABETES EYE EXAM

## 2016-04-05 ENCOUNTER — Other Ambulatory Visit: Payer: Self-pay | Admitting: Family Medicine

## 2016-04-05 DIAGNOSIS — I10 Essential (primary) hypertension: Secondary | ICD-10-CM

## 2016-05-08 ENCOUNTER — Other Ambulatory Visit: Payer: Self-pay | Admitting: Family Medicine

## 2016-05-09 NOTE — Telephone Encounter (Signed)
Patient requesting refill of Bentyl, Mobic and Pravastatin to [facility].

## 2016-05-22 ENCOUNTER — Other Ambulatory Visit: Payer: Self-pay | Admitting: Family Medicine

## 2016-05-22 DIAGNOSIS — Z1231 Encounter for screening mammogram for malignant neoplasm of breast: Secondary | ICD-10-CM

## 2016-05-23 ENCOUNTER — Other Ambulatory Visit: Payer: Self-pay | Admitting: Family Medicine

## 2016-05-23 DIAGNOSIS — E669 Obesity, unspecified: Principal | ICD-10-CM

## 2016-05-23 DIAGNOSIS — IMO0002 Reserved for concepts with insufficient information to code with codable children: Secondary | ICD-10-CM

## 2016-05-23 DIAGNOSIS — E1143 Type 2 diabetes mellitus with diabetic autonomic (poly)neuropathy: Secondary | ICD-10-CM

## 2016-05-23 DIAGNOSIS — E1169 Type 2 diabetes mellitus with other specified complication: Secondary | ICD-10-CM

## 2016-05-23 DIAGNOSIS — E1165 Type 2 diabetes mellitus with hyperglycemia: Secondary | ICD-10-CM

## 2016-05-23 NOTE — Telephone Encounter (Signed)
Patient requesting refill of Bentyl, Glipizide, Metformin and Losartan to [facility].

## 2016-05-28 ENCOUNTER — Ambulatory Visit
Admission: RE | Admit: 2016-05-28 | Discharge: 2016-05-28 | Disposition: A | Discharge status: Home / Self Care | Payer: Medicare Other | Source: Ambulatory Visit | Attending: Family Medicine | Admitting: Family Medicine

## 2016-05-28 DIAGNOSIS — Z1231 Encounter for screening mammogram for malignant neoplasm of breast: Secondary | ICD-10-CM | POA: Diagnosis present

## 2016-05-29 LAB — BASIC METABOLIC PANEL
BUN: 17 mg/dL (ref 4–21)
Creatinine: 0.9 mg/dL (ref 0.5–1.1)
GLUCOSE: 135 mg/dL
POTASSIUM: 4.1 mmol/L (ref 3.4–5.3)
SODIUM: 144 mmol/L (ref 137–147)

## 2016-06-13 ENCOUNTER — Other Ambulatory Visit: Payer: Self-pay | Admitting: Family Medicine

## 2016-06-16 ENCOUNTER — Other Ambulatory Visit: Payer: Self-pay | Admitting: Family Medicine

## 2016-06-26 ENCOUNTER — Encounter: Payer: Self-pay | Admitting: Family Medicine

## 2016-06-26 ENCOUNTER — Ambulatory Visit (INDEPENDENT_AMBULATORY_CARE_PROVIDER_SITE_OTHER): Payer: Medicare Other | Admitting: Family Medicine

## 2016-06-26 VITALS — BP 142/76 | HR 81 | Temp 97.7°F | Resp 16 | Ht 66.0 in | Wt 194.4 lb

## 2016-06-26 DIAGNOSIS — E7800 Pure hypercholesterolemia, unspecified: Secondary | ICD-10-CM

## 2016-06-26 DIAGNOSIS — E1143 Type 2 diabetes mellitus with diabetic autonomic (poly)neuropathy: Secondary | ICD-10-CM | POA: Diagnosis not present

## 2016-06-26 DIAGNOSIS — M353 Polymyalgia rheumatica: Secondary | ICD-10-CM

## 2016-06-26 DIAGNOSIS — I10 Essential (primary) hypertension: Secondary | ICD-10-CM | POA: Diagnosis not present

## 2016-06-26 DIAGNOSIS — E1169 Type 2 diabetes mellitus with other specified complication: Secondary | ICD-10-CM

## 2016-06-26 DIAGNOSIS — E669 Obesity, unspecified: Secondary | ICD-10-CM | POA: Diagnosis not present

## 2016-06-26 LAB — POCT GLYCOSYLATED HEMOGLOBIN (HGB A1C): HEMOGLOBIN A1C: 7

## 2016-06-26 NOTE — Progress Notes (Signed)
Name: Mary Cantrell   MRN: [id number]    DOB: 12/22/1945   Date:06/26/2016       Progress Note  Subjective  Chief Complaint  Chief Complaint  Patient presents with  . Medication Refill    3 month F/U  . Diabetes    Checks once a day Low-86 Average-108 High-140  . Hypertension    Has been elevated due to foot surgery yesterday due to ingrown toe nail  . Hyperlipidemia  . Irritable Bowel Syndrome    Controlled and eating Bananas alot    HPI  Polymyalgia rheumatica: Diagnosed in August 2016. Seen Dr. Dossie Der, Rheumatologist in [place] . She is down to 5 mg since October 2017 and is also taking Methotrexate and is tolerating it well.   She states that feels very stiff in am's, and pain is stable at 5/10. She is on prednisone and meloxicam, explained risk of GI bleed, but she was told by rheumatologist to continue both medications.   HTN: bp has been good, no side effects, no chest pain or palpitation   Asthma Moderate: seen by pulmonologist Dr. Raul Del,, completed pulmonary rehab Summer 2016, she is doing well since taking Prednisone for Polymyalgia Rheumatica  Hyperlipidemia: taking medication as prescribed and is doing well, no side effects.   DM II gastroparesis: she is  taking Metformin 1500 mg daily  and Glipizide 10 mg XL , one to two daily, glucose low of 86- high of 140, average for the past 3 months is 108. She still has some intermittent bloating, doing better now. She is obese, but is losing weight, eating less.   HTN: taking ARB, bp slightly up today and was high during partial toenail resection by Dr. Vickki Muff yesterday, however bp is usually in the low 120's , so we will monitor for now  IBS: she was diagnosed in her 60's, she used to take Bentyl, symptoms improved after she retired in 2009, still triggered by salads and a little worse when started on Prednisone. She is feeling better with lower dose of prednisone and also eating more bananas, no diarrhea, or abdominal  pain, doing well on Dicyclomine    Patient Active Problem List   Diagnosis Date Noted  . Polymyalgia rheumatica ([place]) 04/13/2015  . Benign essential HTN 12/21/2014  . Back pain, chronic 12/21/2014  . Narrowing of intervertebral disc space 12/21/2014  . Diabetic gastroparesis ([place]) 12/21/2014  . Amaurosis fugax of left eye 12/21/2014  . H/O: hypothyroidism 12/21/2014  . Hyperlipidemia 12/21/2014  . IBS (irritable bowel syndrome) 12/21/2014  . Disorder of macula of retina 12/21/2014  . Asthma, moderate 12/21/2014  . NASH (nonalcoholic steatohepatitis) 12/21/2014  . NS (nuclear sclerosis) 12/21/2014  . Osteoarthrosis 12/21/2014  . Obesity (BMI 30-39.9) 12/21/2014  . Psoriasis 12/21/2014  . Knee torn cartilage 12/21/2014  . Closed dislocation of jaw 12/21/2014  . Diabetes mellitus type 2 in obese ([place]) 12/21/2014  . Obstructive apnea 01/03/2014  . History of shoulder surgery 04/23/2011    Past Surgical History:  Procedure Laterality Date  . ABDOMINAL HYSTERECTOMY    . APPENDECTOMY    . ARTERY BIOPSY N/A 02/23/2015   Procedure: BIOPSY TEMPORAL ARTERY;  Surgeon: Algernon Huxley, MD;  Location: [facility] ORS;  Service: Vascular;  Laterality: N/A;  . BREAST BIOPSY Left   . EXCISION / CURETTAGE BONE CYST FINGER     Left Index Finger  . PILONIDAL CYST EXCISION    . SHOULDER ARTHROSCOPY Right [id number]   Dr. Mauri Pole  . STOMACH SURGERY  1941   3 arteries leaking in stomach after appendectomy  . TONSILLECTOMY      Family History  Problem Relation Age of Onset  . Rheum arthritis Father   . Diabetes Father   . Pneumonia Father   . Pneumonia Mother   . Diabetes Sister   . Osteoarthritis Sister     Social History   Social History  . Marital status: Married    Spouse name: N/A  . Number of children: N/A  . Years of education: N/A   Occupational History  . Not on file.   Social History Main Topics  . Smoking status: Never Smoker  . Smokeless tobacco: Never Used  . Alcohol use No   . Drug use: No  . Sexual activity: Yes    Partners: Male     Comment: hysterectomy   Other Topics Concern  . Not on file   Social History Narrative  . No narrative on file     Current Outpatient Prescriptions:  .  albuterol (VENTOLIN HFA) 108 (90 BASE) MCG/ACT inhaler, Inhale 2 puffs into the lungs every 4 (four) hours as needed for wheezing (takes every am and then as needed.). , Disp: , Rfl:  .  aspirin 81 MG tablet, Take 81 mg by mouth every morning. , Disp: , Rfl:  .  Calcium Carbonate-Vit D-Min ([facility] CALCIUM 600 + D/MINERALS) 600-400 MG-UNIT TABS, Take 1 tablet by mouth daily., Disp: , Rfl: 1 .  Cinnamon 500 MG capsule, Take 1 capsule by mouth 2 (two) times daily., Disp: , Rfl:  .  cyanocobalamin 100 MCG tablet, Take 100 mcg by mouth every morning. , Disp: , Rfl:  .  dicyclomine (BENTYL) 20 MG tablet, TAKE 1 TABLET FOUR TIMES A DAY BEFORE MEALS AND AT BEDTIME, Disp: 100 tablet, Rfl: 0 .  fluticasone (FLONASE) 50 MCG/ACT nasal spray, USE 2 SPRAYS IN EACH NOSTRIL AT BEDTIME, Disp: 48 g, Rfl: 1 .  folic acid (FOLVITE) 1 MG tablet, , Disp: , Rfl:  .  gabapentin (NEURONTIN) 100 MG capsule, Take 1 capsule (100 mg total) by mouth at bedtime. 2 capsules at bedtime, Disp: 180 capsule, Rfl: 4 .  glipiZIDE (GLUCOTROL XL) 10 MG 24 hr tablet, TAKE 2 TABLETS (20 MG TOTAL) DAILY WITH BREAKFAST, Disp: 180 tablet, Rfl: 0 .  glucose blood (ONE TOUCH ULTRA TEST) test strip, 1 each by Other route 2 (two) times daily. Use as instructed, Disp: 100 each, Rfl: 2 .  losartan (COZAAR) 50 MG tablet, TAKE 1 TABLET DAILY FOR BLOOD PRESSURE, Disp: 90 tablet, Rfl: 1 .  meloxicam (MOBIC) 7.5 MG tablet, TAKE 1 TABLET TWICE A DAY, Disp: 180 tablet, Rfl: 0 .  metFORMIN (GLUCOPHAGE-XR) 500 MG 24 hr tablet, TAKE 3 TABLETS DAILY, Disp: 270 tablet, Rfl: 0 .  methotrexate (RHEUMATREX) 2.5 MG tablet, Take 1 tablet (2.5 mg total) by mouth once a week. Caution:Chemotherapy. Protect from light., Disp: 6 tablet, Rfl: 0 .   montelukast (SINGULAIR) 10 MG tablet, Take by mouth., Disp: , Rfl:  .  ONE TOUCH ULTRA TEST test strip, USE 1 EACH BY OTHER ROUTE 2 (TWO) TIMES DAILY. USE AS INSTRUCTED, Disp: 100 each, Rfl: 2 .  pravastatin (PRAVACHOL) 40 MG tablet, TAKE 1 TABLET EVERY EVENING FOR CHOLESTEROL, Disp: 90 tablet, Rfl: 3 .  predniSONE (DELTASONE) 5 MG tablet, , Disp: , Rfl:  .  spironolactone-hydrochlorothiazide (ALDACTAZIDE) 25-25 MG tablet, TAKE ONE-HALF (1/2) TO ONE TABLET DAILY FOR BLOOD PRESSURE, Disp: 90 tablet, Rfl: 0 .  vitamin  C (ASCORBIC ACID) 500 MG tablet, Take 1 tablet by mouth at bedtime. , Disp: , Rfl:  .  budesonide-formoterol (SYMBICORT) 160-4.5 MCG/ACT inhaler, Inhale 2 puffs into the lungs every morning., Disp: , Rfl:   Allergies  Allergen Reactions  . Codeine     Other reaction(s): Other (See Comments) Dizzy  . Lantus  [Insulin Glargine]     made skin hot  . Metformin     Coated metformin is tolerated well.  Marland Kitchen Penicillin G     Other reaction(s): Unknown  . Sitagliptin   . Sulfa Antibiotics     Other reaction(s): Other (See Comments) Red burning skin     ROS  Constitutional: Negative for fever , or significant  weight change.  Respiratory: Negative for cough and shortness of breath.   Cardiovascular: Negative for chest pain or palpitations.  Gastrointestinal: Negative for abdominal pain, no bowel changes.  Musculoskeletal: Negative for gait problem or joint swelling.  Skin: Negative for rash.  Neurological: Negative for dizziness or headache.  No other specific complaints in a complete review of systems (except as listed in HPI above).   Objective  Vitals:   06/26/16 1323  BP: (!) 142/76  Pulse: 81  Resp: 16  Temp: 97.7 F (36.5 C)  TempSrc: Oral  SpO2: 94%  Weight: 194 lb 6.4 oz (88.2 kg)  Height: 5' 6"  (1.676 m)    Body mass index is 31.38 kg/m.  Physical Exam  Constitutional: Patient appears well-developed and well-nourished. Obese  No distress.  HEENT:  head atraumatic, normocephalic, pupils equal and reactive to light, neck supple, throat within normal limits Cardiovascular: Normal rate, regular rhythm and normal heart sounds.  No murmur heard. No BLE edema. Pulmonary/Chest: Effort normal and breath sounds normal. No respiratory distress. Abdominal: Soft.  There is no tenderness. Psychiatric: Patient has a normal mood and affect. behavior is normal. Judgment and thought content normal. Muscular Skeletal: wearing wrist braces, no effusion today  Recent Results (from the past 2160 hour(s))  POCT HgB A1C     Status: Abnormal   Collection Time: 06/26/16  1:34 PM  Result Value Ref Range   Hemoglobin A1C 7.0      PHQ2/9: Depression screen [facility] 2/9 06/26/2016 03/13/2016 10/31/2015 08/31/2015 06/13/2015  Decreased Interest 2 0 0 0 0  Down, Depressed, Hopeless 0 0 0 0 0  PHQ - 2 Score 2 0 0 0 0  Altered sleeping 0 - - - -  Tired, decreased energy 3 - - - -  Change in appetite 3 - - - -  Feeling bad or failure about yourself  0 - - - -  Trouble concentrating 0 - - - -  Moving slowly or fidgety/restless 0 - - - -  Suicidal thoughts 0 - - - -  PHQ-9 Score 8 - - - -  Difficult doing work/chores Somewhat difficult - - - -   Mild depression, she does not want to take medication, she states she is frustrated with her health  Fall Risk: Fall Risk  06/26/2016 03/13/2016 10/31/2015 08/31/2015 06/13/2015  Falls in the past year? No Yes No Yes Yes  Number falls in past yr: - 1 - 1 2 or more  Injury with Fall? - Yes - No No  Risk for fall due to : - - - - -     Assessment & Plan  1. Diabetes mellitus type 2 in obese (HCC)  - POCT HgB A1C  2. Pure hypercholesterolemia  At goal for  now  3. Polymyalgia rheumatica ([place])  Continue follow up with Rheumatologist  4. Controlled type 2 diabetes mellitus with gastroparesis ([place])  Doing well, glucose is better controlled  5. Hypertension, benign  Slightly up, we will continue losartan and monitor for  now

## 2016-06-29 IMAGING — CT CT ANGIOGRAPHY NECK
2 of 7 series · 9 of 33 positions shown · IV contrast (APPLIED)
Comparison: None.

CLINICAL DATA: Total visual loss in the LEFT eye for 3 days, with
subsequent blurred vision once this resolved. Evaluate for carotid
disease.

EXAM:
CT ANGIOGRAPHY NECK
TECHNIQUE: Multidetector CT imaging of the neck was performed using the
standard protocol during bolus administration of intravenous
contrast. Multiplanar CT image reconstructions and MIPs were
obtained to evaluate the vascular anatomy. Carotid stenosis
measurements (when applicable) are obtained utilizing NASCET
criteria, using the distal internal carotid diameter as the
denominator.
CONTRAST:  Isovue 370, 80 mL.

[Series 4: cta neck · axial · 0.43mm/px · z∈[-226,-102]mm · 3 of 167 slices shown]
[im 42/167  soft-tissue]
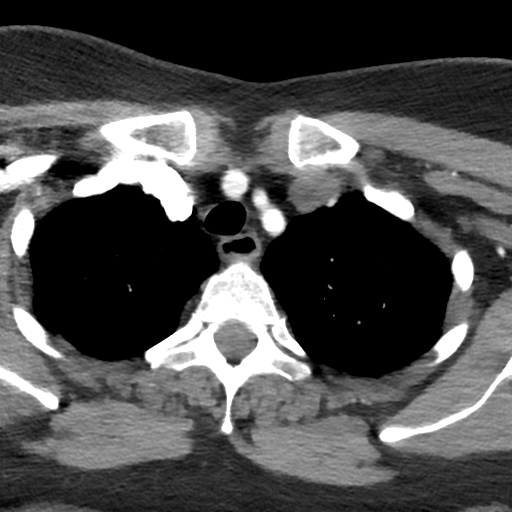
[im 84/167  soft-tissue]
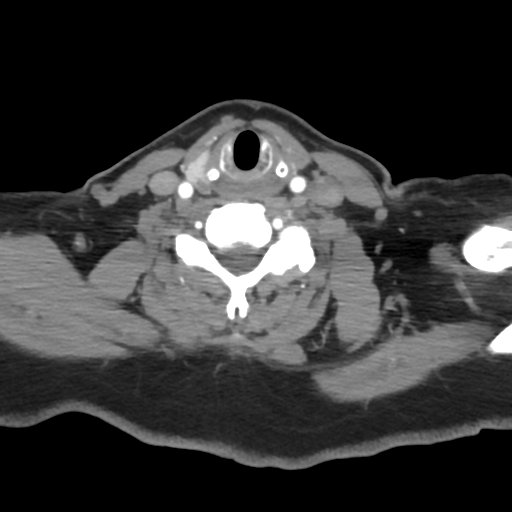
[im 125/167  soft-tissue]
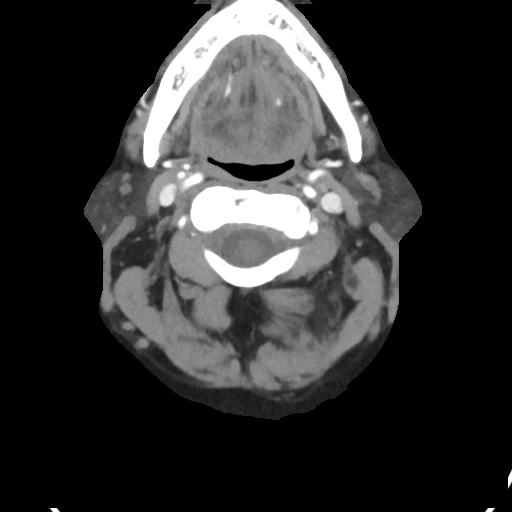

[Series 5: ax thin · axial · 0.39mm/px · z∈[-252,-75]mm · 6 of 249 slices shown]
[im 36/249  soft-tissue]
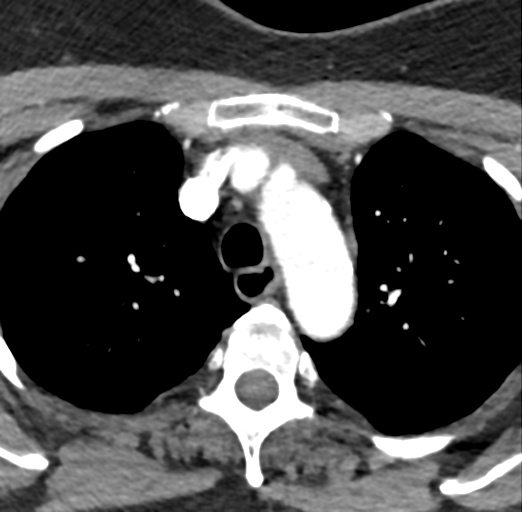
[im 71/249  bone]
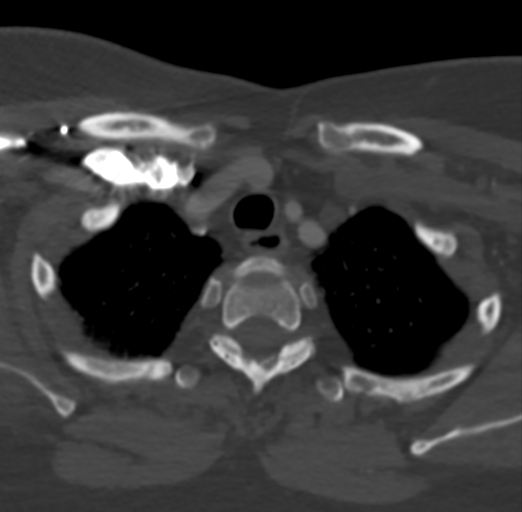
[im 107/249  soft-tissue]
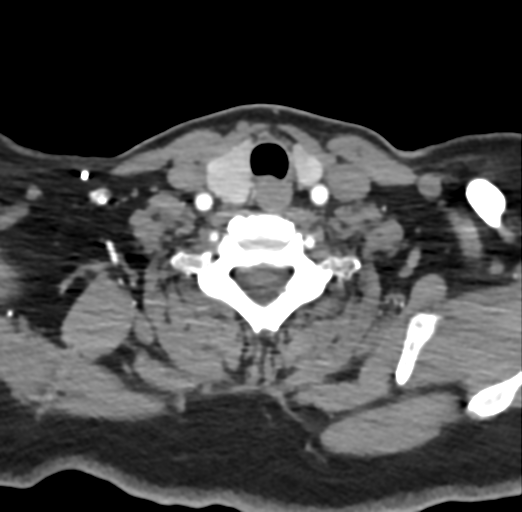
[im 142/249  bone]
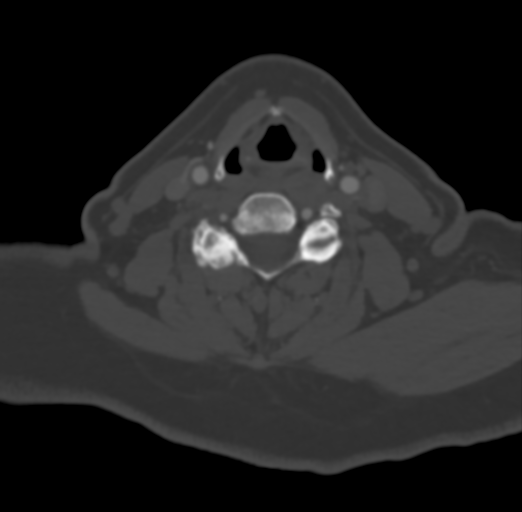
[im 178/249  soft-tissue]
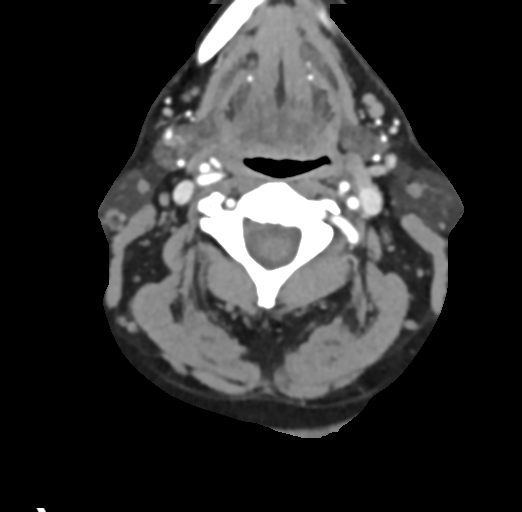
[im 213/249  bone]
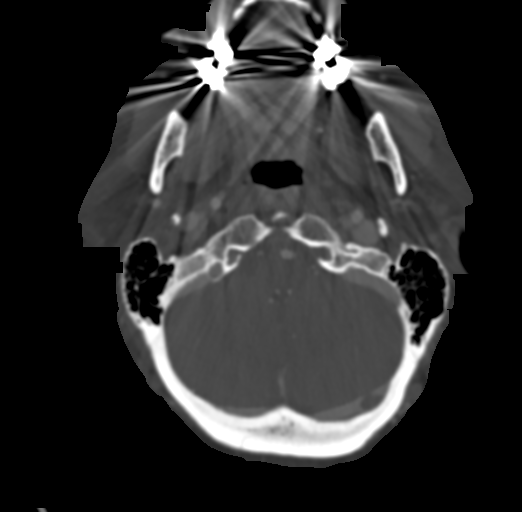

[9 of 33 positions shown; findings below may reference images not displayed]

FINDINGS: Aortic arch: Standard branching. Imaged portion shows no evidence of
aneurysm or dissection. No significant stenosis of the major arch
vessel origins.

Right carotid system: Minor posterior wall plaque. No evidence of
dissection, stenosis (50% or greater) or occlusion.

Left carotid system: Tiny posterior wall calcification. No
ulcerative lesion. No evidence of dissection, stenosis (50% or
greater) or occlusion.

Vertebral arteries: LEFT vertebral dominant. No evidence of
dissection, stenosis (50% or greater) or occlusion.

Additional findings: Mild spondylosis. Airway midline. No lung apex
lesion. No adenopathy. Visualized intracranial compartment negative.
No visible sinus or mastoid disease.
IMPRESSION: No features to suggest ulcerative LEFT carotid lesion, flow-limiting
stenosis, dissection, fibromuscular disease, or other abnormality
predisposing to amaurosis.

## 2016-07-05 ENCOUNTER — Encounter: Payer: Self-pay | Admitting: Family Medicine

## 2016-07-08 ENCOUNTER — Encounter: Payer: Self-pay | Admitting: Family Medicine

## 2016-07-12 ENCOUNTER — Other Ambulatory Visit: Payer: Self-pay | Admitting: Family Medicine

## 2016-07-12 DIAGNOSIS — I10 Essential (primary) hypertension: Secondary | ICD-10-CM

## 2016-08-03 ENCOUNTER — Other Ambulatory Visit: Payer: Self-pay | Admitting: Family Medicine

## 2016-08-03 DIAGNOSIS — E669 Obesity, unspecified: Secondary | ICD-10-CM

## 2016-08-03 DIAGNOSIS — E1143 Type 2 diabetes mellitus with diabetic autonomic (poly)neuropathy: Secondary | ICD-10-CM

## 2016-08-03 DIAGNOSIS — E1165 Type 2 diabetes mellitus with hyperglycemia: Principal | ICD-10-CM

## 2016-08-03 DIAGNOSIS — IMO0002 Reserved for concepts with insufficient information to code with codable children: Secondary | ICD-10-CM

## 2016-08-03 DIAGNOSIS — E1169 Type 2 diabetes mellitus with other specified complication: Secondary | ICD-10-CM

## 2016-08-05 NOTE — Telephone Encounter (Signed)
Patient requesting refill of Bentyl, Glipizide, Mobic and Metformin to [facility].

## 2016-08-23 ENCOUNTER — Ambulatory Visit (INDEPENDENT_AMBULATORY_CARE_PROVIDER_SITE_OTHER): Payer: Self-pay | Admitting: Vascular Surgery

## 2016-08-23 ENCOUNTER — Encounter (INDEPENDENT_AMBULATORY_CARE_PROVIDER_SITE_OTHER): Payer: Self-pay

## 2016-09-05 ENCOUNTER — Other Ambulatory Visit: Payer: Self-pay | Admitting: Family Medicine

## 2016-10-07 LAB — CBC AND DIFFERENTIAL
HCT: 39 % (ref 36–46)
HEMOGLOBIN: 13 g/dL (ref 12.0–16.0)
Neutrophils Absolute: 4 /uL
PLATELETS: 282 10*3/uL (ref 150–399)
WBC: 7.4 10^3/mL

## 2016-10-07 LAB — BASIC METABOLIC PANEL
BUN: 17 mg/dL (ref 4–21)
CREATININE: 0.9 mg/dL (ref 0.5–1.1)
Glucose: 152 mg/dL
POTASSIUM: 3.3 mmol/L — AB (ref 3.4–5.3)
SODIUM: 143 mmol/L (ref 137–147)

## 2016-10-07 LAB — POCT ERYTHROCYTE SEDIMENTATION RATE, NON-AUTOMATED: Sed Rate: 2 mm

## 2016-10-10 ENCOUNTER — Encounter (INDEPENDENT_AMBULATORY_CARE_PROVIDER_SITE_OTHER): Payer: Self-pay

## 2016-10-10 ENCOUNTER — Other Ambulatory Visit (INDEPENDENT_AMBULATORY_CARE_PROVIDER_SITE_OTHER): Payer: Medicare Other

## 2016-10-10 ENCOUNTER — Ambulatory Visit (INDEPENDENT_AMBULATORY_CARE_PROVIDER_SITE_OTHER): Payer: Medicare Other | Admitting: Vascular Surgery

## 2016-10-10 ENCOUNTER — Other Ambulatory Visit (INDEPENDENT_AMBULATORY_CARE_PROVIDER_SITE_OTHER): Payer: Self-pay | Admitting: Vascular Surgery

## 2016-10-10 ENCOUNTER — Encounter (INDEPENDENT_AMBULATORY_CARE_PROVIDER_SITE_OTHER): Payer: Self-pay | Admitting: Vascular Surgery

## 2016-10-10 VITALS — BP 142/92 | HR 80 | Resp 16 | Ht 66.0 in | Wt 191.0 lb

## 2016-10-10 DIAGNOSIS — E7800 Pure hypercholesterolemia, unspecified: Secondary | ICD-10-CM

## 2016-10-10 DIAGNOSIS — M315 Giant cell arteritis with polymyalgia rheumatica: Secondary | ICD-10-CM

## 2016-10-10 DIAGNOSIS — E669 Obesity, unspecified: Secondary | ICD-10-CM | POA: Diagnosis not present

## 2016-10-10 DIAGNOSIS — I6529 Occlusion and stenosis of unspecified carotid artery: Secondary | ICD-10-CM | POA: Diagnosis not present

## 2016-10-10 DIAGNOSIS — E1169 Type 2 diabetes mellitus with other specified complication: Secondary | ICD-10-CM

## 2016-10-10 DIAGNOSIS — I6522 Occlusion and stenosis of left carotid artery: Secondary | ICD-10-CM | POA: Diagnosis not present

## 2016-10-15 ENCOUNTER — Other Ambulatory Visit: Payer: Self-pay | Admitting: Family Medicine

## 2016-10-15 DIAGNOSIS — E1143 Type 2 diabetes mellitus with diabetic autonomic (poly)neuropathy: Secondary | ICD-10-CM

## 2016-10-15 DIAGNOSIS — E669 Obesity, unspecified: Principal | ICD-10-CM

## 2016-10-15 DIAGNOSIS — E1169 Type 2 diabetes mellitus with other specified complication: Secondary | ICD-10-CM

## 2016-10-15 DIAGNOSIS — I10 Essential (primary) hypertension: Secondary | ICD-10-CM

## 2016-10-15 DIAGNOSIS — IMO0002 Reserved for concepts with insufficient information to code with codable children: Secondary | ICD-10-CM

## 2016-10-15 DIAGNOSIS — E1165 Type 2 diabetes mellitus with hyperglycemia: Secondary | ICD-10-CM

## 2016-10-17 ENCOUNTER — Encounter: Payer: Self-pay | Admitting: Family Medicine

## 2016-10-18 ENCOUNTER — Other Ambulatory Visit: Payer: Self-pay | Admitting: Family Medicine

## 2016-10-18 NOTE — Telephone Encounter (Signed)
Patient requesting refill of Test Strips to [facility].

## 2016-10-19 ENCOUNTER — Encounter: Payer: Self-pay | Admitting: Family Medicine

## 2016-10-21 ENCOUNTER — Other Ambulatory Visit: Payer: Self-pay

## 2016-10-21 MED ORDER — GLUCOSE BLOOD VI STRP: 1.0000 | ORAL_STRIP | Freq: Two times a day (BID) | 2 refills | Status: DC

## 2016-10-28 ENCOUNTER — Encounter: Payer: Self-pay | Admitting: Family Medicine

## 2016-10-28 ENCOUNTER — Ambulatory Visit (INDEPENDENT_AMBULATORY_CARE_PROVIDER_SITE_OTHER): Payer: Medicare Other | Admitting: Family Medicine

## 2016-10-28 VITALS — BP 138/64 | HR 88 | Temp 98.7°F | Resp 16 | Ht 66.0 in | Wt 191.1 lb

## 2016-10-28 DIAGNOSIS — E7800 Pure hypercholesterolemia, unspecified: Secondary | ICD-10-CM | POA: Diagnosis not present

## 2016-10-28 DIAGNOSIS — I10 Essential (primary) hypertension: Secondary | ICD-10-CM

## 2016-10-28 DIAGNOSIS — L409 Psoriasis, unspecified: Secondary | ICD-10-CM

## 2016-10-28 DIAGNOSIS — I6529 Occlusion and stenosis of unspecified carotid artery: Secondary | ICD-10-CM | POA: Diagnosis not present

## 2016-10-28 DIAGNOSIS — E1143 Type 2 diabetes mellitus with diabetic autonomic (poly)neuropathy: Secondary | ICD-10-CM

## 2016-10-28 DIAGNOSIS — K580 Irritable bowel syndrome with diarrhea: Secondary | ICD-10-CM | POA: Diagnosis not present

## 2016-10-28 DIAGNOSIS — M353 Polymyalgia rheumatica: Secondary | ICD-10-CM

## 2016-10-28 NOTE — Progress Notes (Signed)
Name: Mary Cantrell   MRN: [id number]    DOB: 02/06/1946   Date:10/28/2016       Progress Note  Subjective  Chief Complaint  Chief Complaint  Patient presents with  . Diabetes    4 month follow up checking once daily high 145 low 93  . Hypertension    been high last few appointments  . Hyperlipidemia    no issues    HPI  Polymyalgia rheumatica: Diagnosed in August 2016. Seen Dr. Dossie Der, Rheumatologist in [place]. She is down to 5 mg since October 2017 and now alternating with 2.14m  and is also taking Methotrexate and is tolerating it well.  She states that feels  stiff in am's, and pain is stable at 2/10. She is on prednisone and meloxicam, explained risk of GI bleed, but she was told by rheumatologist to continue both medications.   Psoriasis; she has noticed that rash has returned since she started weaning down dose of Prednisone, using topical medication for now.  HTN: bp has been a little elevated on recent visits to sub-specialist, no side effects, no chest pain or palpitation , bp at home and in our office has been at goal   Asthma Moderate: seen by pulmonologist Dr. FRaul Del, completed pulmonary rehab Summer 2016, she is doing well since taking Prednisone for Polymyalgia Rheumatica  Hyperlipidemia: taking medication as prescribed and is doing well, no side effects.   DM II gastroparesis: she is taking Metformin 750 mg daily three daily and Glipizide 10 mg XL , one  daily, glucose has been in the low 100's. She still has some intermittent bloating, doing better now. She is obese, but is losing weight, eating less.   HTN: taking ARB, bp is at goal, potassium slightly low on her recent labs, but increased potassium intake on her diet  IBS: she was diagnosed in her 89's she used to take Bentyl, symptoms improved after she retired in 2009, still triggered by salads. No blood or mucus in her stools  Patient Active Problem List   Diagnosis Date Noted  . Polymyalgia  rheumatica ([place] 04/13/2015  . Benign essential HTN 12/21/2014  . Back pain, chronic 12/21/2014  . Narrowing of intervertebral disc space 12/21/2014  . Diabetic gastroparesis ([place] 12/21/2014  . Amaurosis fugax of left eye 12/21/2014  . H/O: hypothyroidism 12/21/2014  . Hyperlipidemia 12/21/2014  . IBS (irritable bowel syndrome) 12/21/2014  . Disorder of macula of retina 12/21/2014  . Asthma, moderate 12/21/2014  . NASH (nonalcoholic steatohepatitis) 12/21/2014  . NS (nuclear sclerosis) 12/21/2014  . Osteoarthrosis 12/21/2014  . Obesity (BMI 30-39.9) 12/21/2014  . Psoriasis 12/21/2014  . Knee torn cartilage 12/21/2014  . Closed dislocation of jaw 12/21/2014  . Diabetes mellitus type 2 in obese ([place] 12/21/2014  . Obstructive apnea 01/03/2014  . History of shoulder surgery 04/23/2011    Past Surgical History:  Procedure Laterality Date  . ABDOMINAL HYSTERECTOMY    . APPENDECTOMY    . ARTERY BIOPSY N/A 02/23/2015   Procedure: BIOPSY TEMPORAL ARTERY;  Surgeon: JAlgernon Huxley MD;  Location: [facility] ORS;  Service: Vascular;  Laterality: N/A;  . BREAST BIOPSY Left   . EXCISION / CURETTAGE BONE CYST FINGER     Left Index Finger  . PILONIDAL CYST EXCISION    . SHOULDER ARTHROSCOPY Right [id number]  Dr. CMauri Pole . STOMACH SURGERY  1941   3 arteries leaking in stomach after appendectomy  . TONSILLECTOMY      Family History  Problem Relation  Age of Onset  . Rheum arthritis Father   . Diabetes Father   . Pneumonia Father   . Pneumonia Mother   . Diabetes Sister   . Osteoarthritis Sister     Social History   Social History  . Marital status: Married    Spouse name: N/A  . Number of children: N/A  . Years of education: N/A   Occupational History  . Not on file.   Social History Main Topics  . Smoking status: Never Smoker  . Smokeless tobacco: Never Used  . Alcohol use No  . Drug use: No  . Sexual activity: Yes    Partners: Male     Comment: hysterectomy   Other Topics  Concern  . Not on file   Social History Narrative  . No narrative on file     Current Outpatient Prescriptions:  .  albuterol (VENTOLIN HFA) 108 (90 BASE) MCG/ACT inhaler, Inhale 2 puffs into the lungs every 4 (four) hours as needed for wheezing (takes every am and then as needed.). , Disp: , Rfl:  .  aspirin 81 MG tablet, Take 81 mg by mouth every morning. , Disp: , Rfl:  .  Azelastine HCl 0.15 % SOLN, , Disp: , Rfl:  .  budesonide-formoterol (SYMBICORT) 160-4.5 MCG/ACT inhaler, Inhale 2 puffs into the lungs every morning., Disp: , Rfl:  .  Calcium Carbonate-Vit D-Min ([employer] CALCIUM 600 + D/MINERALS) 600-400 MG-UNIT TABS, Take 1 tablet by mouth daily., Disp: , Rfl: 1 .  Cinnamon 500 MG capsule, Take 1 capsule by mouth 2 (two) times daily., Disp: , Rfl:  .  cyanocobalamin 100 MCG tablet, Take 100 mcg by mouth every morning. , Disp: , Rfl:  .  dicyclomine (BENTYL) 20 MG tablet, TAKE 1 TABLET FOUR TIMES A DAY BEFORE MEALS AND AT BEDTIME, Disp: 100 tablet, Rfl: 0 .  fluticasone (FLONASE) 50 MCG/ACT nasal spray, USE 2 SPRAYS IN EACH NOSTRIL AT BEDTIME, Disp: 48 g, Rfl: 1 .  folic acid (FOLVITE) 1 MG tablet, , Disp: , Rfl:  .  gabapentin (NEURONTIN) 100 MG capsule, Take 1 capsule (100 mg total) by mouth at bedtime. 2 capsules at bedtime, Disp: 180 capsule, Rfl: 4 .  glipiZIDE (GLUCOTROL XL) 10 MG 24 hr tablet, TAKE 2 TABLETS (20 MG TOTAL) DAILY WITH BREAKFAST, Disp: 180 tablet, Rfl: 0 .  glucose blood (ONE TOUCH ULTRA TEST) test strip, 1 each by Other route 2 (two) times daily. E11.69, E11.43, Disp: 100 each, Rfl: 2 .  losartan (COZAAR) 50 MG tablet, TAKE 1 TABLET DAILY FOR BLOOD PRESSURE, Disp: 90 tablet, Rfl: 1 .  meloxicam (MOBIC) 7.5 MG tablet, TAKE 1 TABLET TWICE A DAY, Disp: 180 tablet, Rfl: 0 .  metFORMIN (GLUCOPHAGE-XR) 500 MG 24 hr tablet, TAKE 3 TABLETS DAILY, Disp: 270 tablet, Rfl: 0 .  methotrexate (RHEUMATREX) 2.5 MG tablet, Take 1 tablet (2.5 mg total) by mouth once a week.  Caution:Chemotherapy. Protect from light., Disp: 6 tablet, Rfl: 0 .  montelukast (SINGULAIR) 10 MG tablet, Take by mouth., Disp: , Rfl:  .  omeprazole (PRILOSEC) 40 MG capsule, , Disp: , Rfl:  .  pravastatin (PRAVACHOL) 40 MG tablet, TAKE 1 TABLET EVERY EVENING FOR CHOLESTEROL, Disp: 90 tablet, Rfl: 3 .  predniSONE (DELTASONE) 5 MG tablet, , Disp: , Rfl:  .  spironolactone-hydrochlorothiazide (ALDACTAZIDE) 25-25 MG tablet, TAKE ONE-HALF (1/2) TO ONE TABLET DAILY FOR BLOOD PRESSURE, Disp: 90 tablet, Rfl: 0 .  vitamin C (ASCORBIC ACID) 500 MG tablet, Take  1 tablet by mouth at bedtime. , Disp: , Rfl:   Allergies  Allergen Reactions  . Codeine     Other reaction(s): Other (See Comments) Dizzy  . Lantus  [Insulin Glargine]     made skin hot  . Metformin     Coated metformin is tolerated well.  Marland Kitchen Penicillin G     Other reaction(s): Unknown  . Sitagliptin   . Sulfa Antibiotics     Other reaction(s): Other (See Comments) Red burning skin     ROS  Constitutional: Negative for fever or weight change.  Respiratory: Negative for cough and shortness of breath.   Cardiovascular: Negative for chest pain or palpitations.  Gastrointestinal: Negative for abdominal pain, no bowel changes.  Musculoskeletal: Negative for gait problem or joint swelling.  Skin: Negative for rash.  Neurological: Negative for dizziness, positive for mild headache.  No other specific complaints in a complete review of systems (except as listed in HPI above).  Objective  Vitals:   10/28/16 1323  BP: 138/64  Pulse: 88  Resp: 16  Temp: 98.7 F (37.1 C)  SpO2: 96%  Weight: 191 lb 2 oz (86.7 kg)  Height: 5' 6"  (1.676 m)    Body mass index is 30.85 kg/m.  Physical Exam  Constitutional: Patient appears well-developed and well-nourished. Obese  No distress.  HEENT: head atraumatic, normocephalic, pupils equal and reactive to light, neck supple, throat within normal limits Cardiovascular: Normal rate, regular  rhythm and normal heart sounds.  No murmur heard. No BLE edema. Pulmonary/Chest: Effort normal and breath sounds normal. No respiratory distress. Abdominal: Soft.  There is no tenderness. Muscular skeletal: not pain during rom of shoulder or hip Psychiatric: Patient has a normal mood and affect. behavior is normal. Judgment and thought content normal.  Recent Results (from the past 2160 hour(s))  CBC and differential     Status: None   Collection Time: 10/07/16 12:00 AM  Result Value Ref Range   Hemoglobin 13.0 12.0 - 16.0 g/dL   HCT 39 36 - 46 %   Neutrophils Absolute 4 /L   Platelets 282 150 - 399 K/L   WBC 7.4 10^3/mL  POCT erythrocyte sed rate, Non-automated     Status: None   Collection Time: 10/07/16 12:00 AM  Result Value Ref Range   Sed Rate 2 mm  Basic metabolic panel     Status: Abnormal   Collection Time: 10/07/16 12:00 AM  Result Value Ref Range   Glucose 152 mg/dL   BUN 17 4 - 21 mg/dL   Creatinine 0.9 0.5 - 1.1 mg/dL   Potassium 3.3 (A) 3.4 - 5.3 mmol/L   Sodium 143 137 - 147 mmol/L      PHQ2/9: Depression screen [facility] 2/9 06/26/2016 03/13/2016 10/31/2015 08/31/2015 06/13/2015  Decreased Interest 2 0 0 0 0  Down, Depressed, Hopeless 0 0 0 0 0  PHQ - 2 Score 2 0 0 0 0  Altered sleeping 0 - - - -  Tired, decreased energy 3 - - - -  Change in appetite 3 - - - -  Feeling bad or failure about yourself  0 - - - -  Trouble concentrating 0 - - - -  Moving slowly or fidgety/restless 0 - - - -  Suicidal thoughts 0 - - - -  PHQ-9 Score 8 - - - -  Difficult doing work/chores Somewhat difficult - - - -     Fall Risk: Fall Risk  06/26/2016 03/13/2016 10/31/2015 08/31/2015 06/13/2015  Falls in the past year? No Yes No Yes Yes  Number falls in past yr: - 1 - 1 2 or more  Injury with Fall? - Yes - No No  Risk for fall due to : - - - - -     Assessment & Plan  1. Pure hypercholesterolemia  Continue statin therapy, last LDL was at goal  2. Well controlled type 2 diabetes  mellitus with gastroparesis ([place])  Going down on prednisone, recheck level and adjust medication as needed, explained again risk of hypoglycemia with glipizide  - Hemoglobin A1c - Urine Microalbumin w/creat. ratio  3. Hypertension, benign  bp has been going up recently during office visits, but at goal today, in the past it was low, we will continue to monitor for now, advised to check bp at home and let us know if it goes up  4. Polymyalgia rheumatica (HCC)  Doing better, weaning down prednisone  5. Irritable bowel syndrome with diarrhea  Stable, she does not want any other medication  6. Psoriasis  Continue topical medications for now

## 2016-10-29 ENCOUNTER — Telehealth: Payer: Self-pay | Admitting: Family Medicine

## 2016-10-29 ENCOUNTER — Other Ambulatory Visit: Payer: Self-pay | Admitting: Family Medicine

## 2016-10-29 DIAGNOSIS — M549 Dorsalgia, unspecified: Principal | ICD-10-CM

## 2016-10-29 DIAGNOSIS — G8929 Other chronic pain: Secondary | ICD-10-CM

## 2016-10-29 LAB — MICROALBUMIN / CREATININE URINE RATIO
Creatinine, Urine: 85 mg/dL (ref 20–320)
Microalb Creat Ratio: 8 mcg/mg creat (ref <30)
Microalb, Ur: 0.7 mg/dL

## 2016-10-29 LAB — HEMOGLOBIN A1C
Hgb A1c MFr Bld: 6.3 % — ABNORMAL HIGH (ref <5.7)
Mean Plasma Glucose: 134 mg/dL

## 2016-10-30 ENCOUNTER — Encounter: Payer: Self-pay | Admitting: Family Medicine

## 2016-11-06 ENCOUNTER — Other Ambulatory Visit: Payer: Self-pay | Admitting: Family Medicine

## 2016-11-07 ENCOUNTER — Other Ambulatory Visit: Payer: Self-pay | Admitting: Family Medicine

## 2016-11-07 ENCOUNTER — Telehealth: Payer: Self-pay | Admitting: Family Medicine

## 2016-11-07 NOTE — Telephone Encounter (Signed)
The code is on the prescription. Please call pharmacy

## 2016-11-07 NOTE — Telephone Encounter (Signed)
Pt is trying to get a refill on her one touch ultra testing strips. She is currently not able to get them due to the pharmacy telling her that there is a coding error. Pt uses [facility] [address] Pharmacy told pt that they have sent this information over for correction twice.

## 2016-11-08 ENCOUNTER — Other Ambulatory Visit: Payer: Self-pay

## 2016-11-08 MED ORDER — GLUCOSE BLOOD VI STRP: 1.0000 | ORAL_STRIP | Freq: Two times a day (BID) | 2 refills | Status: DC

## 2016-11-08 NOTE — Telephone Encounter (Signed)
Called the pharmacy they stated they could not see the Diagnosis code. Resend prescription with Diagnosis code twice on the prescription.

## 2016-11-15 NOTE — Telephone Encounter (Signed)
ERRENOUS °

## 2016-12-16 DIAGNOSIS — I6522 Occlusion and stenosis of left carotid artery: Secondary | ICD-10-CM | POA: Insufficient documentation

## 2016-12-16 NOTE — Progress Notes (Signed)
Subjective:    Patient ID: Mary Cantrell, female    DOB: 10/13/1945, 71 y.o.   MRN: [id number] Chief Complaint  Patient presents with  . Carotid    1 year carotid follow up   Patient presents for yearly carotid artery stenosis follow-up.The stenosis has been followed by surveillance duplexes. The patient underwent a bilateral carotid duplex scan which showed no change from the previous exam on 08/18/2015. Duplex is stable no right internal carotid artery stenosis and Left ICA stenosis (1-39%). The patient denies experiencing Amaurosis Fugax, TIA like symptoms or focal motor deficits.     Review of Systems  Constitutional: Negative.   HENT: Negative.   Eyes: Negative.   Respiratory: Negative.   Cardiovascular: Negative.   Gastrointestinal: Negative.   Endocrine: Negative.   Genitourinary: Negative.   Musculoskeletal: Negative.   Skin: Negative.   Allergic/Immunologic: Negative.   Neurological: Negative.   Hematological: Negative.   Psychiatric/Behavioral: Negative.       Objective:   Physical Exam  Constitutional: She is oriented to person, place, and time. She appears well-developed. No distress.  HENT:  Head: Normocephalic and atraumatic.  Eyes: Conjunctivae are normal. Pupils are equal, round, and reactive to light.  Neck: Normal range of motion.  No carotid artery bruits noted  Cardiovascular: Normal rate, regular rhythm, normal heart sounds and intact distal pulses.   Pulses:      Radial pulses are 2+ on the right side, and 2+ on the left side.  Pulmonary/Chest: Effort normal.  Musculoskeletal: Normal range of motion. She exhibits no edema.  Neurological: She is alert and oriented to person, place, and time.  Skin: Skin is warm and dry. She is not diaphoretic.  Psychiatric: She has a normal mood and affect. Her behavior is normal. Judgment and thought content normal.    BP (!) 142/92 (BP Location: Right Arm)   Pulse 80   Resp 16   Ht 5' 6"  (1.676 m)   Wt 191 lb  (86.6 kg)   BMI 30.83 kg/m   Past Medical History:  Diagnosis Date  . Achilles tendinitis of right lower extremity   . Allergy   . Asthma    Moderate  . Chronic back pain   . Depression   . Gastroparesis    DM  . Hyperlipidemia   . Hypertension   . Hypoxemia   . Irritable bowel syndrome   . Maculopathy   . NASH (nonalcoholic steatohepatitis)   . Nuclear sclerosis of both eyes   . OA (osteoarthritis)    Hips  . OSA (obstructive sleep apnea)   . Post-void dribbling   . Psoriasis   . Regurgitation   . SOB (shortness of breath)   . Tear of left rotator cuff   . TMJ (dislocation of temporomandibular joint)   . Trochanteric bursitis of right hip     Social History   Social History  . Marital status: Married    Spouse name: N/A  . Number of children: N/A  . Years of education: N/A   Occupational History  . Not on file.   Social History Main Topics  . Smoking status: Never Smoker  . Smokeless tobacco: Never Used  . Alcohol use No  . Drug use: No  . Sexual activity: Yes    Partners: Male     Comment: hysterectomy   Other Topics Concern  . Not on file   Social History Narrative  . No narrative on file    Past Surgical History:  Procedure Laterality Date  . ABDOMINAL HYSTERECTOMY    . APPENDECTOMY    . ARTERY BIOPSY N/A 02/23/2015   Procedure: BIOPSY TEMPORAL ARTERY;  Surgeon: Algernon Huxley, MD;  Location: [facility] ORS;  Service: Vascular;  Laterality: N/A;  . BREAST BIOPSY Left   . EXCISION / CURETTAGE BONE CYST FINGER     Left Index Finger  . PILONIDAL CYST EXCISION    . SHOULDER ARTHROSCOPY Right [id number]   Dr. Mauri Pole  . STOMACH SURGERY  1941   3 arteries leaking in stomach after appendectomy  . TONSILLECTOMY      Family History  Problem Relation Age of Onset  . Rheum arthritis Father   . Diabetes Father   . Pneumonia Father   . Pneumonia Mother   . Diabetes Sister   . Osteoarthritis Sister     Allergies  Allergen Reactions  . Codeine      Other reaction(s): Other (See Comments) Dizzy  . Lantus  [Insulin Glargine]     made skin hot  . Metformin     Coated metformin is tolerated well.  Marland Kitchen Penicillin G     Other reaction(s): Unknown  . Sitagliptin   . Sulfa Antibiotics     Other reaction(s): Other (See Comments) Red burning skin       Assessment & Plan:  Patient presents for yearly carotid artery stenosis follow-up.The stenosis has been followed by surveillance duplexes. The patient underwent a bilateral carotid duplex scan which showed no change from the previous exam on 08/18/2015. Duplex is stable no right internal carotid artery stenosis and Left ICA stenosis (1-39%). The patient denies experiencing Amaurosis Fugax, TIA like symptoms or focal motor deficits.   1. Stenosis of left carotid artery Studies reviewed with patient. Patient asymptomatic with stable duplex. No intervention at this time. Patient to return in one year for surveillance carotid duplex. Patient to remain abstinent of tobacco use. I have discussed with the patient at length the risk factors for and pathogenesis of atherosclerotic disease and encouraged a healthy diet, regular exercise regimen and blood pressure / glucose control.  Patient was instructed to contact our office in the interim with problems such as arm / leg weakness or numbness, speech / swallowing difficulty or temporary monocular blindness. The patient expresses their understanding.  - VAS US CAROTID; Future  2. Pure hypercholesterolemia - Stable Encouraged good control as its slows the progression of atherosclerotic disease  3. Diabetes mellitus type 2 in obese (HCC) - stable Encouraged good control as its slows the progression of atherosclerotic disease   Current Outpatient Prescriptions on File Prior to Visit  Medication Sig Dispense Refill  . albuterol (VENTOLIN HFA) 108 (90 BASE) MCG/ACT inhaler Inhale 2 puffs into the lungs every 4 (four) hours as needed for wheezing  (takes every am and then as needed.).     Marland Kitchen aspirin 81 MG tablet Take 81 mg by mouth every morning.     . Calcium Carbonate-Vit D-Min ([employer] CALCIUM 600 + D/MINERALS) 600-400 MG-UNIT TABS Take 1 tablet by mouth daily.  1  . Cinnamon 500 MG capsule Take 1 capsule by mouth 2 (two) times daily.    . cyanocobalamin 100 MCG tablet Take 100 mcg by mouth every morning.     . fluticasone (FLONASE) 50 MCG/ACT nasal spray USE 2 SPRAYS IN EACH NOSTRIL AT BEDTIME 48 g 1  . folic acid (FOLVITE) 1 MG tablet     . meloxicam (MOBIC) 7.5 MG tablet TAKE 1 TABLET TWICE A  DAY 180 tablet 0  . methotrexate (RHEUMATREX) 2.5 MG tablet Take 1 tablet (2.5 mg total) by mouth once a week. Caution:Chemotherapy. Protect from light. 6 tablet 0  . montelukast (SINGULAIR) 10 MG tablet Take by mouth.    . pravastatin (PRAVACHOL) 40 MG tablet TAKE 1 TABLET EVERY EVENING FOR CHOLESTEROL 90 tablet 3  . predniSONE (DELTASONE) 5 MG tablet     . vitamin C (ASCORBIC ACID) 500 MG tablet Take 1 tablet by mouth at bedtime.     . budesonide-formoterol (SYMBICORT) 160-4.5 MCG/ACT inhaler Inhale 2 puffs into the lungs every morning.     No current facility-administered medications on file prior to visit.     There are no Patient Instructions on file for this visit. No Follow-up on file.   Martavis Gurney A Artie Mcintyre, PA-C

## 2016-12-31 ENCOUNTER — Emergency Department
Admission: EM | Admit: 2016-12-31 | Discharge: 2017-01-01 | Disposition: A | Discharge status: Home / Self Care | Payer: Medicare Other | Attending: Emergency Medicine | Admitting: Emergency Medicine

## 2016-12-31 ENCOUNTER — Emergency Department: Payer: Medicare Other

## 2016-12-31 DIAGNOSIS — K3184 Gastroparesis: Secondary | ICD-10-CM | POA: Insufficient documentation

## 2016-12-31 DIAGNOSIS — Y939 Activity, unspecified: Secondary | ICD-10-CM | POA: Insufficient documentation

## 2016-12-31 DIAGNOSIS — W19XXXA Unspecified fall, initial encounter: Secondary | ICD-10-CM

## 2016-12-31 DIAGNOSIS — R0789 Other chest pain: Secondary | ICD-10-CM | POA: Diagnosis present

## 2016-12-31 DIAGNOSIS — W1809XA Striking against other object with subsequent fall, initial encounter: Secondary | ICD-10-CM | POA: Diagnosis not present

## 2016-12-31 DIAGNOSIS — E119 Type 2 diabetes mellitus without complications: Secondary | ICD-10-CM | POA: Insufficient documentation

## 2016-12-31 DIAGNOSIS — Z7951 Long term (current) use of inhaled steroids: Secondary | ICD-10-CM | POA: Diagnosis not present

## 2016-12-31 DIAGNOSIS — Z7984 Long term (current) use of oral hypoglycemic drugs: Secondary | ICD-10-CM | POA: Insufficient documentation

## 2016-12-31 DIAGNOSIS — J45909 Unspecified asthma, uncomplicated: Secondary | ICD-10-CM | POA: Diagnosis not present

## 2016-12-31 DIAGNOSIS — S80212A Abrasion, left knee, initial encounter: Secondary | ICD-10-CM | POA: Diagnosis not present

## 2016-12-31 DIAGNOSIS — Y999 Unspecified external cause status: Secondary | ICD-10-CM | POA: Insufficient documentation

## 2016-12-31 DIAGNOSIS — S20212A Contusion of left front wall of thorax, initial encounter: Secondary | ICD-10-CM | POA: Insufficient documentation

## 2016-12-31 DIAGNOSIS — T148XXA Other injury of unspecified body region, initial encounter: Secondary | ICD-10-CM

## 2016-12-31 DIAGNOSIS — Z7952 Long term (current) use of systemic steroids: Secondary | ICD-10-CM | POA: Insufficient documentation

## 2016-12-31 DIAGNOSIS — M25562 Pain in left knee: Secondary | ICD-10-CM

## 2016-12-31 DIAGNOSIS — W6192XA Struck by other birds, initial encounter: Secondary | ICD-10-CM | POA: Diagnosis not present

## 2016-12-31 DIAGNOSIS — I10 Essential (primary) hypertension: Secondary | ICD-10-CM | POA: Diagnosis not present

## 2016-12-31 DIAGNOSIS — Y929 Unspecified place or not applicable: Secondary | ICD-10-CM | POA: Diagnosis not present

## 2016-12-31 MED ORDER — HYDROCODONE-ACETAMINOPHEN 5-325 MG PO TABS
1.0000 | ORAL_TABLET | Freq: Once | ORAL | Status: DC
Start: 2016-12-31 — End: 2017-01-01
  Filled 2016-12-31: qty 1

## 2016-12-31 NOTE — ED Provider Notes (Signed)
[facility] Emergency Department Provider Note   ____________________________________________   First MD Initiated Contact with Patient 12/31/16 2328     (approximate)  I have reviewed the triage vital signs and the nursing notes.   HISTORY  Chief Complaint Fall and Chest Pain    HPI Mary Cantrell is a 71 y.o. female who presents to the ED from home with a chief complaint of fall with left rib and knee pain. Patient reports she was attacked by birds approximately 7:30 PM. She lost her balance and fell onto her left side, striking her head on the car. Denies LOC. Complains of left rib pain and abrasion to her left knee. Went in to use her CPAP for a few minutes because she felt short of breath. Pain worsened with deep breathing and with movement. Denies associated headache, vision changes, neck pain, abdominal pain, nausea, vomiting, hematuria. Nothing taken prior to arrival although has been did clean the knee.   Past Medical History:  Diagnosis Date  . Achilles tendinitis of right lower extremity   . Allergy   . Asthma    Moderate  . Chronic back pain   . Depression   . Gastroparesis    DM  . Hyperlipidemia   . Hypertension   . Hypoxemia   . Irritable bowel syndrome   . Maculopathy   . NASH (nonalcoholic steatohepatitis)   . Nuclear sclerosis of both eyes   . OA (osteoarthritis)    Hips  . OSA (obstructive sleep apnea)   . Post-void dribbling   . Psoriasis   . Regurgitation   . SOB (shortness of breath)   . Tear of left rotator cuff   . TMJ (dislocation of temporomandibular joint)   . Trochanteric bursitis of right hip     Patient Active Problem List   Diagnosis Date Noted  . Stenosis of left carotid artery 12/16/2016  . Polymyalgia rheumatica ([place]) 04/13/2015  . Benign essential HTN 12/21/2014  . Back pain, chronic 12/21/2014  . Narrowing of intervertebral disc space 12/21/2014  . Diabetic gastroparesis ([place]) 12/21/2014  .  Amaurosis fugax of left eye 12/21/2014  . H/O: hypothyroidism 12/21/2014  . Hyperlipidemia 12/21/2014  . IBS (irritable bowel syndrome) 12/21/2014  . Disorder of macula of retina 12/21/2014  . Asthma, moderate 12/21/2014  . NASH (nonalcoholic steatohepatitis) 12/21/2014  . NS (nuclear sclerosis) 12/21/2014  . Osteoarthrosis 12/21/2014  . Obesity (BMI 30-39.9) 12/21/2014  . Psoriasis 12/21/2014  . Knee torn cartilage 12/21/2014  . Closed dislocation of jaw 12/21/2014  . Diabetes mellitus type 2 in obese ([place]) 12/21/2014  . Obstructive apnea 01/03/2014  . History of shoulder surgery 04/23/2011    Past Surgical History:  Procedure Laterality Date  . ABDOMINAL HYSTERECTOMY    . APPENDECTOMY    . ARTERY BIOPSY N/A 02/23/2015   Procedure: BIOPSY TEMPORAL ARTERY;  Surgeon: Algernon Huxley, MD;  Location: [facility] ORS;  Service: Vascular;  Laterality: N/A;  . BREAST BIOPSY Left   . EXCISION / CURETTAGE BONE CYST FINGER     Left Index Finger  . PILONIDAL CYST EXCISION    . SHOULDER ARTHROSCOPY Right [id number]   Dr. Mauri Pole  . STOMACH SURGERY  1941   3 arteries leaking in stomach after appendectomy  . TONSILLECTOMY      Prior to Admission medications   Medication Sig Start Date End Date Taking? Authorizing Provider  albuterol (VENTOLIN HFA) 108 (90 BASE) MCG/ACT inhaler Inhale 2 puffs into the lungs every 4 (four)  hours as needed for wheezing (takes every am and then as needed.).     [provider]  aspirin 81 MG tablet Take 81 mg by mouth every morning.     [provider]  Azelastine HCl 0.15 % SOLN  10/17/16   [provider]  budesonide-formoterol (SYMBICORT) 160-4.5 MCG/ACT inhaler Inhale 2 puffs into the lungs every morning. 08/17/14 08/17/15  Erby Pian, MD  Calcium Carbonate-Vit D-Min ([employer] CALCIUM 600 + D/MINERALS) 600-400 MG-UNIT TABS Take 1 tablet by mouth daily. 12/07/15   [provider]  Cinnamon 500 MG capsule Take 1 capsule by mouth 2 (two)  times daily.    [provider]  cyanocobalamin 100 MCG tablet Take 100 mcg by mouth every morning.     [provider]  dicyclomine (BENTYL) 20 MG tablet TAKE 1 TABLET FOUR TIMES A DAY BEFORE MEALS AND AT BEDTIME 10/29/16   Sowles, Drue Stager, MD  fluticasone [facility]) 50 MCG/ACT nasal spray USE 2 SPRAYS IN EACH NOSTRIL AT BEDTIME 07/15/16   Steele Sizer, MD  folic acid (FOLVITE) 1 MG tablet  01/22/16   [provider]  gabapentin (NEURONTIN) 100 MG capsule TAKE 2 CAPSULES AT BEDTIME 10/29/16   Sowles, Drue Stager, MD  glipiZIDE (GLUCOTROL XL) 10 MG 24 hr tablet TAKE 2 TABLETS (20 MG TOTAL) DAILY WITH BREAKFAST 10/16/16   Sowles, Drue Stager, MD  glucose blood (ONE TOUCH ULTRA TEST) test strip 1 each by Other route 2 (two) times daily. DX: E11.69, E11.43 11/08/16   Steele Sizer, MD  losartan (COZAAR) 50 MG tablet TAKE 1 TABLET DAILY FOR BLOOD PRESSURE 10/16/16   Steele Sizer, MD  meloxicam (MOBIC) 7.5 MG tablet TAKE 1 TABLET TWICE A DAY 08/05/16   Ancil Boozer, Drue Stager, MD  metFORMIN (GLUCOPHAGE-XR) 500 MG 24 hr tablet TAKE 3 TABLETS DAILY 10/16/16   Steele Sizer, MD  methotrexate (RHEUMATREX) 2.5 MG tablet Take 1 tablet (2.5 mg total) by mouth once a week. Caution:Chemotherapy. Protect from light. 03/13/16   Steele Sizer, MD  montelukast (SINGULAIR) 10 MG tablet Take by mouth. 10/26/15 10/25/16  [provider]  omeprazole (PRILOSEC) 40 MG capsule  10/17/16   [provider]  ONE TOUCH ULTRA TEST test strip USE 1 EACH BY OTHER ROUTE 2 (TWO) TIMES DAILY. USE AS INSTRUCTED 11/07/16   Steele Sizer, MD  pravastatin (PRAVACHOL) 40 MG tablet TAKE 1 TABLET EVERY EVENING FOR CHOLESTEROL 05/09/16   Steele Sizer, MD  predniSONE (DELTASONE) 5 MG tablet  06/13/16   [provider]  spironolactone-hydrochlorothiazide (ALDACTAZIDE) 25-25 MG tablet TAKE ONE-HALF (1/2) TO ONE TABLET DAILY FOR BLOOD PRESSURE 10/16/16   Steele Sizer, MD  vitamin C (ASCORBIC ACID) 500 MG  tablet Take 1 tablet by mouth at bedtime.     [provider]    Allergies Codeine; Lantus  [insulin glargine]; Metformin; Penicillin g; Sitagliptin; and Sulfa antibiotics  Family History  Problem Relation Age of Onset  . Rheum arthritis Father   . Diabetes Father   . Pneumonia Father   . Pneumonia Mother   . Diabetes Sister   . Osteoarthritis Sister     Social History Social History  Substance Use Topics  . Smoking status: Never Smoker  . Smokeless tobacco: Never Used  . Alcohol use No    Review of Systems  Constitutional: No fever/chills. Eyes: No visual changes. ENT: No sore throat. Cardiovascular: Denies chest pain. Respiratory: Positive for left rib pain. Denies shortness of breath. Gastrointestinal: No abdominal pain.  No nausea, no  vomiting.  No diarrhea.  No constipation. Genitourinary: Negative for dysuria. Musculoskeletal: Positive for right knee abrasion. Negative for back pain. Skin: Negative for rash. Neurological: Negative for headaches, focal weakness or numbness.   ____________________________________________   PHYSICAL EXAM:  VITAL SIGNS: ED Triage Vitals  Enc Vitals Group     BP 12/31/16 2021 (!) 157/99     Pulse Rate 12/31/16 2021 94     Resp 12/31/16 2021 16     Temp 12/31/16 2021 98.5 F (36.9 C)     Temp Source 12/31/16 2021 Oral     SpO2 12/31/16 2021 (!) 89 %     Weight 12/31/16 2022 192 lb (87.1 kg)     Height 12/31/16 2022 5' 6"  (1.676 m)     Head Circumference --      Peak Flow --      Pain Score 12/31/16 2020 9     Pain Loc --      Pain Edu? --      Excl. in [place]? --     Constitutional: Alert and oriented. Well appearing and in no acute distress. Eyes: Conjunctivae are normal. PERRL. EOMI. Head: Atraumatic. Nose: No congestion/rhinnorhea. Mouth/Throat: Mucous membranes are moist.  Oropharynx non-erythematous. Neck: No stridor.  No cervical spine tenderness to palpation. Cardiovascular: Normal rate, regular rhythm.  Grossly normal heart sounds.  Good peripheral circulation. Respiratory: Normal respiratory effort.  No retractions. Lungs CTAB. Splinting. Left lateral and posterior lower ribs tender to palpation. No crepitus. No ecchymosis. Gastrointestinal: Soft and nontender to light or deep palpation, especially at left upper quadrant.. No distention. No abdominal bruits. No CVA tenderness. Musculoskeletal: No lower extremity tenderness nor edema.  No joint effusions. Neurologic:  Normal speech and language. No gross focal neurologic deficits are appreciated. No gait instability. Skin:  Skin is warm, dry and intact. No rash noted. Psychiatric: Mood and affect are normal. Speech and behavior are normal.  ____________________________________________   LABS (all labs ordered are listed, but only abnormal results are displayed)  Labs Reviewed - No data to display ____________________________________________  EKG  None ____________________________________________  RADIOLOGY  Dg Chest 2 View  Result Date: 12/31/2016 CLINICAL DATA:  71 year old female with left-sided rib pain. EXAM: CHEST  2 VIEW COMPARISON:  Chest radiograph dated 12/31/2011 FINDINGS: There is mild diffuse interstitial coarsening. No focal consolidation, pleural effusion, or pneumothorax. The cardiac silhouette is within normal limits. No acute osseous pathology. IMPRESSION: No active cardiopulmonary disease. Electronically Signed   By: Anner Crete M.D.   On: 12/31/2016 21:01    ____________________________________________   PROCEDURES  Procedure(s) performed: None  Procedures  Critical Care performed: No  ____________________________________________   INITIAL IMPRESSION / ASSESSMENT AND PLAN / ED COURSE  Pertinent labs & imaging results that were available during my care of the patient were reviewed by me and considered in my medical decision making (see chart for details).  71 year old female who presents with  left ribs contusion status post mechanical fall. Room air saturations now 97%. Patient declines prescription for analgesia but does accept a Norco now. Strict return precautions given. Patient and spouse verbalize understanding and agree with plan of care.  Clinical Course as of Jan 02 447  Wed Jan 01, 2017  0448 Addendum: Chart review - nurse erroneously entered pulse rate on discharge. Patient's heart rate was within normal limits.  [JS]    Clinical Course User Index [JS] Paulette Blanch, MD     ____________________________________________   FINAL CLINICAL IMPRESSION(S) / ED DIAGNOSES  Final diagnoses:  Fall, initial encounter  Rib contusion, left, initial encounter  Chest wall pain  Abrasion  Acute pain of left knee      NEW MEDICATIONS STARTED DURING THIS VISIT:  Discharge Medication List as of 12/31/2016 11:44 PM       Note:  This document was prepared using Dragon voice recognition software and may include unintentional dictation errors.    Paulette Blanch, MD 01/01/17 [phone]

## 2016-12-31 NOTE — Discharge Instructions (Signed)
1. Apply ice to affected area several times daily. 2. You may take the pain pills you have as needed for discomfort. 3. Use incentive spirometer as instructed. 4. Return to the ER for worsening symptoms, persistent vomiting, difficulty breathing or other concerns.

## 2016-12-31 NOTE — ED Notes (Signed)
CXR report reviewed. Awaiting room for MD eval.

## 2016-12-31 NOTE — ED Notes (Addendum)
Pt states that she was outside and a bird attack her. She fell on pavement. Left knee hit pavement and head hit the car. Went in house and put on her cpap for more oxygen. Pt also states that when she took a deep breath she had chest pain but it has diminished. Pt states that she takes Prednisone so her bones may be brittle or soft.

## 2016-12-31 NOTE — ED Triage Notes (Signed)
Pt was attacked by bird today and fell into her car and hit knee on pavement and hit rib and left sholder and head into the car. Pt states it hurts to take deep breaths. States pain is 9/10

## 2017-01-01 NOTE — ED Notes (Signed)
Pt HR was 86 on discharge. Charting error.

## 2017-01-03 LAB — BASIC METABOLIC PANEL
CREATININE: 1 (ref 0.5–1.1)
Glucose: 187

## 2017-01-03 LAB — HEPATIC FUNCTION PANEL
ALT: 37 — AB (ref 7–35)
AST: 32 (ref 13–35)
Alkaline Phosphatase: 90 (ref 25–125)
BILIRUBIN, TOTAL: 0.7

## 2017-01-03 LAB — POCT ERYTHROCYTE SEDIMENTATION RATE, NON-AUTOMATED: Sed Rate: 14

## 2017-01-06 ENCOUNTER — Encounter: Payer: Self-pay | Admitting: Family Medicine

## 2017-01-07 ENCOUNTER — Ambulatory Visit (INDEPENDENT_AMBULATORY_CARE_PROVIDER_SITE_OTHER): Payer: Medicare Other | Admitting: Family Medicine

## 2017-01-07 ENCOUNTER — Encounter: Payer: Self-pay | Admitting: Family Medicine

## 2017-01-07 VITALS — BP 118/70 | HR 83 | Temp 98.1°F | Resp 16 | Wt 190.2 lb

## 2017-01-07 DIAGNOSIS — G8911 Acute pain due to trauma: Secondary | ICD-10-CM

## 2017-01-07 DIAGNOSIS — I6522 Occlusion and stenosis of left carotid artery: Secondary | ICD-10-CM

## 2017-01-07 DIAGNOSIS — E1143 Type 2 diabetes mellitus with diabetic autonomic (poly)neuropathy: Secondary | ICD-10-CM | POA: Diagnosis not present

## 2017-01-07 DIAGNOSIS — M545 Low back pain: Secondary | ICD-10-CM

## 2017-01-07 DIAGNOSIS — Z23 Encounter for immunization: Secondary | ICD-10-CM | POA: Diagnosis not present

## 2017-01-07 DIAGNOSIS — Z9181 History of falling: Secondary | ICD-10-CM

## 2017-01-07 MED ORDER — TIZANIDINE HCL 4 MG PO CAPS: 4.0000 mg | ORAL_CAPSULE | Freq: Three times a day (TID) | ORAL | 0 refills | Status: DC | PRN

## 2017-01-07 NOTE — Patient Instructions (Signed)
Low Back Strain Rehab Ask your health care provider which exercises are safe for you. Do exercises exactly as told by your health care provider and adjust them as directed. It is normal to feel mild stretching, pulling, tightness, or discomfort as you do these exercises, but you should stop right away if you feel sudden pain or your pain gets worse. Do not begin these exercises until told by your health care provider. Stretching and range of motion exercises These exercises warm up your muscles and joints and improve the movement and flexibility of your back. These exercises also help to relieve pain, numbness, and tingling. Exercise A: Single knee to chest  1. Lie on your back on a firm surface with both legs straight. 2. Bend one of your knees. Use your hands to move your knee up toward your chest until you feel a gentle stretch in your lower back and buttock. ? Hold your leg in this position by holding onto the front of your knee. ? Keep your other leg as straight as possible. 3. Hold for __________ seconds. 4. Slowly return to the starting position. 5. Repeat with your other leg. Repeat __________ times. Complete this exercise __________ times a day. Exercise B: Prone extension on elbows  1. Lie on your abdomen on a firm surface. 2. Prop yourself up on your elbows. 3. Use your arms to help lift your chest up until you feel a gentle stretch in your abdomen and your lower back. ? This will place some of your body weight on your elbows. If this is uncomfortable, try stacking pillows under your chest. ? Your hips should stay down, against the surface that you are lying on. Keep your hip and back muscles relaxed. 4. Hold for __________ seconds. 5. Slowly relax your upper body and return to the starting position. Repeat __________ times. Complete this exercise __________ times a day. Strengthening exercises These exercises build strength and endurance in your back. Endurance is the ability to  use your muscles for a long time, even after they get tired. Exercise C: Pelvic tilt 1. Lie on your back on a firm surface. Bend your knees and keep your feet flat. 2. Tense your abdominal muscles. Tip your pelvis up toward the ceiling and flatten your lower back into the floor. ? To help with this exercise, you may place a small towel under your lower back and try to push your back into the towel. 3. Hold for __________ seconds. 4. Let your muscles relax completely before you repeat this exercise. Repeat __________ times. Complete this exercise __________ times a day. Exercise D: Alternating arm and leg raises  1. Get on your hands and knees on a firm surface. If you are on a hard floor, you may want to use padding to cushion your knees, such as an exercise mat. 2. Line up your arms and legs. Your hands should be below your shoulders, and your knees should be below your hips. 3. Lift your left leg behind you. At the same time, raise your right arm and straighten it in front of you. ? Do not lift your leg higher than your hip. ? Do not lift your arm higher than your shoulder. ? Keep your abdominal and back muscles tight. ? Keep your hips facing the ground. ? Do not arch your back. ? Keep your balance carefully, and do not hold your breath. 4. Hold for __________ seconds. 5. Slowly return to the starting position and repeat with your right leg  and your left arm. Repeat __________ times. Complete this exercise __________times a day. Exercise J: Single leg lower with bent knees 1. Lie on your back on a firm surface. 2. Tense your abdominal muscles and lift your feet off the floor, one foot at a time, so your knees and hips are bent in an "L" shape (at about 90 degrees). ? Your knees should be over your hips and your lower legs should be parallel to the floor. 3. Keeping your abdominal muscles tense and your knee bent, slowly lower one of your legs so your toe touches the ground. 4. Lift your  leg back up to return to the starting position. ? Do not hold your breath. ? Do not let your back arch. Keep your back flat against the ground. 5. Repeat with your other leg. Repeat __________ times. Complete this exercise __________ times a day. Posture and body mechanics  Body mechanics refers to the movements and positions of your body while you do your daily activities. Posture is part of body mechanics. Good posture and healthy body mechanics can help to relieve stress in your body's tissues and joints. Good posture means that your spine is in its natural S-curve position (your spine is neutral), your shoulders are pulled back slightly, and your head is not tipped forward. The following are general guidelines for applying improved posture and body mechanics to your everyday activities. Standing   When standing, keep your spine neutral and your feet about hip-width apart. Keep a slight bend in your knees. Your ears, shoulders, and hips should line up.  When you do a task in which you stand in one place for a long time, place one foot up on a stable object that is 2-4 inches (5-10 cm) high, such as a footstool. This helps keep your spine neutral. Sitting   When sitting, keep your spine neutral and keep your feet flat on the floor. Use a footrest, if necessary, and keep your thighs parallel to the floor. Avoid rounding your shoulders, and avoid tilting your head forward.  When working at a desk or a computer, keep your desk at a height where your hands are slightly lower than your elbows. Slide your chair under your desk so you are close enough to maintain good posture.  When working at a computer, place your monitor at a height where you are looking straight ahead and you do not have to tilt your head forward or downward to look at the screen. Resting   When lying down and resting, avoid positions that are most painful for you.  If you have pain with activities such as sitting, bending,  stooping, or squatting (flexion-based activities), lie in a position in which your body does not bend very much. For example, avoid curling up on your side with your arms and knees near your chest (fetal position).  If you have pain with activities such as standing for a long time or reaching with your arms (extension-based activities), lie with your spine in a neutral position and bend your knees slightly. Try the following positions: ? Lying on your side with a pillow between your knees. ? Lying on your back with a pillow under your knees. Lifting   When lifting objects, keep your feet at least shoulder-width apart and tighten your abdominal muscles.  Bend your knees and hips and keep your spine neutral. It is important to lift using the strength of your legs, not your back. Do not lock your knees straight  out.  Always ask for help to lift heavy or awkward objects. This information is not intended to replace advice given to you by your health care provider. Make sure you discuss any questions you have with your health care provider. Document Released: 06/10/2005 Document Revised: 02/15/2016 Document Reviewed: 03/22/2015 Elsevier Interactive Patient Education  [employer].
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
## 2017-01-07 NOTE — Addendum Note (Signed)
Addended by: Vonna Kotyk L on: 01/07/2017 11:20 AM   Modules accepted: Orders

## 2017-01-21 ENCOUNTER — Other Ambulatory Visit: Payer: Self-pay | Admitting: Family Medicine

## 2017-01-21 DIAGNOSIS — E1143 Type 2 diabetes mellitus with diabetic autonomic (poly)neuropathy: Secondary | ICD-10-CM

## 2017-01-21 DIAGNOSIS — E669 Obesity, unspecified: Principal | ICD-10-CM

## 2017-01-21 DIAGNOSIS — E1169 Type 2 diabetes mellitus with other specified complication: Secondary | ICD-10-CM

## 2017-01-21 DIAGNOSIS — E1165 Type 2 diabetes mellitus with hyperglycemia: Secondary | ICD-10-CM

## 2017-01-21 DIAGNOSIS — IMO0002 Reserved for concepts with insufficient information to code with codable children: Secondary | ICD-10-CM

## 2017-01-22 NOTE — Telephone Encounter (Signed)
Patient requesting refill of Glipizide, Metformin and Meloxicam to [facility].

## 2017-02-28 ENCOUNTER — Ambulatory Visit: Payer: Medicare Other | Admitting: Family Medicine

## 2017-03-19 ENCOUNTER — Other Ambulatory Visit: Payer: Self-pay | Admitting: Family Medicine

## 2017-03-19 DIAGNOSIS — I10 Essential (primary) hypertension: Secondary | ICD-10-CM

## 2017-03-20 NOTE — Telephone Encounter (Signed)
Patient requesting refill of Pravastatin and Spironolactone-HCTZ to [facility].

## 2017-03-21 ENCOUNTER — Other Ambulatory Visit: Payer: Self-pay

## 2017-03-21 DIAGNOSIS — IMO0002 Reserved for concepts with insufficient information to code with codable children: Secondary | ICD-10-CM

## 2017-03-21 DIAGNOSIS — E1143 Type 2 diabetes mellitus with diabetic autonomic (poly)neuropathy: Secondary | ICD-10-CM

## 2017-03-21 DIAGNOSIS — E669 Obesity, unspecified: Secondary | ICD-10-CM

## 2017-03-21 DIAGNOSIS — E1165 Type 2 diabetes mellitus with hyperglycemia: Principal | ICD-10-CM

## 2017-03-21 DIAGNOSIS — E1169 Type 2 diabetes mellitus with other specified complication: Secondary | ICD-10-CM

## 2017-03-21 NOTE — Telephone Encounter (Signed)
Patient is requesting a refill on her Glipizide 10 mg.  Last office visit: 01/07/2017  Lab Results  Component Value Date   HGBA1C 6.3 (H) 10/28/2016

## 2017-03-23 MED ORDER — GLIPIZIDE ER 10 MG PO TB24: ORAL_TABLET | ORAL | 0 refills | Status: DC

## 2017-03-23 NOTE — Telephone Encounter (Signed)
Please let her know that we will need to discuss another regiment for her diabetes 20 mg of Glipizide is a very high dose. I will send 90 day supply, but we will need to change

## 2017-04-20 ENCOUNTER — Other Ambulatory Visit: Payer: Self-pay | Admitting: Family Medicine

## 2017-05-25 ENCOUNTER — Other Ambulatory Visit: Payer: Self-pay | Admitting: Family Medicine

## 2017-05-25 DIAGNOSIS — IMO0002 Reserved for concepts with insufficient information to code with codable children: Secondary | ICD-10-CM

## 2017-05-25 DIAGNOSIS — E1143 Type 2 diabetes mellitus with diabetic autonomic (poly)neuropathy: Secondary | ICD-10-CM

## 2017-05-25 DIAGNOSIS — E669 Obesity, unspecified: Principal | ICD-10-CM

## 2017-05-25 DIAGNOSIS — E1165 Type 2 diabetes mellitus with hyperglycemia: Secondary | ICD-10-CM

## 2017-05-25 DIAGNOSIS — E1169 Type 2 diabetes mellitus with other specified complication: Secondary | ICD-10-CM

## 2017-07-18 ENCOUNTER — Ambulatory Visit (INDEPENDENT_AMBULATORY_CARE_PROVIDER_SITE_OTHER): Payer: Medicare Other | Admitting: Podiatry

## 2017-07-18 ENCOUNTER — Encounter: Payer: Self-pay | Admitting: Podiatry

## 2017-07-18 ENCOUNTER — Ambulatory Visit: Payer: Medicare Other

## 2017-07-18 DIAGNOSIS — L600 Ingrowing nail: Secondary | ICD-10-CM

## 2017-07-18 DIAGNOSIS — M779 Enthesopathy, unspecified: Secondary | ICD-10-CM

## 2017-07-18 NOTE — Progress Notes (Signed)
   Subjective:    Patient ID: Mary Cantrell, female    DOB: 08/01/1945, 72 y.o.   MRN: [id number]  HPI    Review of Systems     Objective:   Physical Exam        Assessment & Plan:

## 2017-07-18 NOTE — Progress Notes (Signed)
Dg  

## 2017-07-19 ENCOUNTER — Other Ambulatory Visit: Payer: Self-pay | Admitting: Family Medicine

## 2017-07-19 DIAGNOSIS — E669 Obesity, unspecified: Secondary | ICD-10-CM

## 2017-07-19 DIAGNOSIS — E1165 Type 2 diabetes mellitus with hyperglycemia: Principal | ICD-10-CM

## 2017-07-19 DIAGNOSIS — E1143 Type 2 diabetes mellitus with diabetic autonomic (poly)neuropathy: Secondary | ICD-10-CM

## 2017-07-19 DIAGNOSIS — E1169 Type 2 diabetes mellitus with other specified complication: Secondary | ICD-10-CM

## 2017-07-19 DIAGNOSIS — IMO0002 Reserved for concepts with insufficient information to code with codable children: Secondary | ICD-10-CM

## 2017-07-19 DIAGNOSIS — I10 Essential (primary) hypertension: Secondary | ICD-10-CM

## 2017-07-20 NOTE — Progress Notes (Signed)
   Subjective: Patient presents today for evaluation of pain to the bilateral great toes that began one week ago. She reports associated redness, left worse than right. Patient is concerned for possible ingrown nails. She has been applying antibiotic ointment and soaking them in Epsom salt with no significant relief. Patient presents today for further treatment and evaluation.   Past Medical History:  Diagnosis Date  . Achilles tendinitis of right lower extremity   . Allergy   . Asthma    Moderate  . Chronic back pain   . Depression   . Gastroparesis    DM  . Hyperlipidemia   . Hypertension   . Hypoxemia   . Irritable bowel syndrome   . Maculopathy   . NASH (nonalcoholic steatohepatitis)   . Nuclear sclerosis of both eyes   . OA (osteoarthritis)    Hips  . OSA (obstructive sleep apnea)   . Post-void dribbling   . Psoriasis   . Regurgitation   . SOB (shortness of breath)   . Tear of left rotator cuff   . TMJ (dislocation of temporomandibular joint)   . Trochanteric bursitis of right hip     Objective:  General: Well developed, nourished, in no acute distress, alert and oriented x3   Dermatology: Skin is warm, dry and supple bilateral. Medial border of the left great toe appears to be erythematous with evidence of an ingrowing nail. Pain on palpation noted to the border of the nail fold. The remaining nails appear unremarkable at this time. There are no open sores, lesions.  Vascular: Dorsalis Pedis artery and Posterior Tibial artery pedal pulses palpable. No lower extremity edema noted.   Neruologic: Grossly intact via light touch bilateral.  Musculoskeletal: Muscular strength within normal limits in all groups bilateral. Normal range of motion noted to all pedal and ankle joints.   Assesement: #1 Paronychia with ingrowing nail medial border of the left great toe #2 Pain in toe #3 Incurvated nail  Plan of Care:  1. Patient evaluated.  2. Discussed treatment  alternatives and plan of care. Explained nail avulsion procedure and post procedure course to patient. 3. Patient opted for permanent partial nail avulsion.  4. Prior to procedure, local anesthesia infiltration utilized using 3 ml of a 50:50 mixture of 2% plain lidocaine and 0.5% plain marcaine in a normal hallux block fashion and a betadine prep performed.  5. Partial permanent nail avulsion with chemical matrixectomy performed using [id number] applications of phenol followed by alcohol flush.  6. Light dressing applied. 7. Return to clinic in 2 weeks.   Edrick Kins, DPM [facility]  Dr. Edrick Kins, [address]                                        [place], [place] [address]                Office [phone]  Fax [phone]

## 2017-07-21 ENCOUNTER — Other Ambulatory Visit: Payer: Self-pay

## 2017-07-21 MED ORDER — OLMESARTAN MEDOXOMIL 20 MG PO TABS: 20.0000 mg | ORAL_TABLET | Freq: Every day | ORAL | 0 refills | Status: DC

## 2017-07-21 NOTE — Telephone Encounter (Signed)
Refill request for general medication: Spironolactone-HCTZ, Meloxicam,  Glipizide and Flonase to [facility].   Last office visit: 12/28/2016   No Follow-up on file.

## 2017-08-01 ENCOUNTER — Encounter: Payer: Self-pay | Admitting: Podiatry

## 2017-08-01 ENCOUNTER — Ambulatory Visit (INDEPENDENT_AMBULATORY_CARE_PROVIDER_SITE_OTHER): Payer: Medicare Other | Admitting: Podiatry

## 2017-08-01 DIAGNOSIS — L600 Ingrowing nail: Secondary | ICD-10-CM | POA: Diagnosis not present

## 2017-08-04 NOTE — Progress Notes (Signed)
   Subjective: Patient presents today 2 weeks post ingrown nail permanent nail avulsion procedure to the medial border of the left great toe. She reports some soreness to the area when pressure is applied but reports she is doing well overall. Patient is here for further evaluation and treatment.   Past Medical History:  Diagnosis Date  . Achilles tendinitis of right lower extremity   . Allergy   . Asthma    Moderate  . Chronic back pain   . Depression   . Gastroparesis    DM  . Hyperlipidemia   . Hypertension   . Hypoxemia   . Irritable bowel syndrome   . Maculopathy   . NASH (nonalcoholic steatohepatitis)   . Nuclear sclerosis of both eyes   . OA (osteoarthritis)    Hips  . OSA (obstructive sleep apnea)   . Post-void dribbling   . Psoriasis   . Regurgitation   . SOB (shortness of breath)   . Tear of left rotator cuff   . TMJ (dislocation of temporomandibular joint)   . Trochanteric bursitis of right hip     Objective: Skin is warm, dry and supple. Nail and respective nail fold appears to be healing appropriately. Open wound to the associated nail fold with a granular wound base and moderate amount of fibrotic tissue. Minimal drainage noted. Mild erythema around the periungual region likely due to phenol chemical matricectomy.  Assessment: #1 postop permanent partial nail avulsion medial border left great toe #2 open wound periungual nail fold of respective digit.   Plan of care: #1 patient was evaluated  #2 debridement of open wound was performed to the periungual border of the respective toe using a currette. Antibiotic ointment and Band-Aid was applied. #3 patient is to return to clinic on a PRN basis.   Edrick Kins, DPM [facility]  Dr. Edrick Kins, [address]                                        [place], [place] [address]                Office [phone]  Fax [phone]

## 2017-09-21 ENCOUNTER — Other Ambulatory Visit: Payer: Self-pay | Admitting: Family Medicine

## 2017-10-02 ENCOUNTER — Other Ambulatory Visit: Payer: Self-pay | Admitting: Family Medicine

## 2017-10-02 DIAGNOSIS — E1169 Type 2 diabetes mellitus with other specified complication: Secondary | ICD-10-CM

## 2017-10-02 DIAGNOSIS — I10 Essential (primary) hypertension: Secondary | ICD-10-CM

## 2017-10-02 DIAGNOSIS — E1165 Type 2 diabetes mellitus with hyperglycemia: Principal | ICD-10-CM

## 2017-10-02 DIAGNOSIS — E1143 Type 2 diabetes mellitus with diabetic autonomic (poly)neuropathy: Secondary | ICD-10-CM

## 2017-10-02 DIAGNOSIS — IMO0002 Reserved for concepts with insufficient information to code with codable children: Secondary | ICD-10-CM

## 2017-10-02 DIAGNOSIS — E669 Obesity, unspecified: Secondary | ICD-10-CM

## 2017-10-03 NOTE — Telephone Encounter (Signed)
Refill request for general medication: Glipizide 10 mg Meloxicam 7.5 mg Olmesartan 20 mg Aldactazide 25-25 mg  Last office visit: 08/01/2017  Last physical exam: None indicated   No follow-ups on file.  Lab Results  Component Value Date   HGBA1C 6.3 (H) 10/28/2016   BP Readings from Last 3 Encounters:  01/07/17 118/70  01/01/17 (!) 128/96  10/28/16 138/64

## 2017-10-03 NOTE — Telephone Encounter (Signed)
Approved in primary's absence with changes

## 2017-10-10 ENCOUNTER — Encounter (INDEPENDENT_AMBULATORY_CARE_PROVIDER_SITE_OTHER): Payer: Medicare Other

## 2017-10-10 ENCOUNTER — Ambulatory Visit (INDEPENDENT_AMBULATORY_CARE_PROVIDER_SITE_OTHER): Payer: Medicare Other | Admitting: Vascular Surgery

## 2017-11-11 ENCOUNTER — Ambulatory Visit (INDEPENDENT_AMBULATORY_CARE_PROVIDER_SITE_OTHER): Payer: Medicare Other | Admitting: Vascular Surgery

## 2017-11-11 ENCOUNTER — Encounter (INDEPENDENT_AMBULATORY_CARE_PROVIDER_SITE_OTHER): Payer: Medicare Other

## 2017-12-01 ENCOUNTER — Other Ambulatory Visit: Payer: Self-pay | Admitting: Family Medicine

## 2017-12-01 DIAGNOSIS — I10 Essential (primary) hypertension: Secondary | ICD-10-CM

## 2017-12-01 DIAGNOSIS — E669 Obesity, unspecified: Principal | ICD-10-CM

## 2017-12-01 DIAGNOSIS — E1165 Type 2 diabetes mellitus with hyperglycemia: Secondary | ICD-10-CM

## 2017-12-01 DIAGNOSIS — E1143 Type 2 diabetes mellitus with diabetic autonomic (poly)neuropathy: Secondary | ICD-10-CM

## 2017-12-01 DIAGNOSIS — IMO0002 Reserved for concepts with insufficient information to code with codable children: Secondary | ICD-10-CM

## 2017-12-01 DIAGNOSIS — E1169 Type 2 diabetes mellitus with other specified complication: Secondary | ICD-10-CM

## 2017-12-03 NOTE — Telephone Encounter (Signed)
Must be seen. Can she come Friday?

## 2017-12-04 ENCOUNTER — Ambulatory Visit (INDEPENDENT_AMBULATORY_CARE_PROVIDER_SITE_OTHER): Payer: Medicare Other | Admitting: Family Medicine

## 2017-12-04 ENCOUNTER — Encounter: Payer: Self-pay | Admitting: Family Medicine

## 2017-12-04 VITALS — BP 130/70 | HR 83 | Resp 16 | Ht 66.0 in | Wt 184.2 lb

## 2017-12-04 DIAGNOSIS — E1169 Type 2 diabetes mellitus with other specified complication: Secondary | ICD-10-CM

## 2017-12-04 DIAGNOSIS — E1143 Type 2 diabetes mellitus with diabetic autonomic (poly)neuropathy: Secondary | ICD-10-CM | POA: Diagnosis not present

## 2017-12-04 DIAGNOSIS — I10 Essential (primary) hypertension: Secondary | ICD-10-CM

## 2017-12-04 DIAGNOSIS — K580 Irritable bowel syndrome with diarrhea: Secondary | ICD-10-CM | POA: Diagnosis not present

## 2017-12-04 DIAGNOSIS — E785 Hyperlipidemia, unspecified: Secondary | ICD-10-CM | POA: Diagnosis not present

## 2017-12-04 DIAGNOSIS — Z1211 Encounter for screening for malignant neoplasm of colon: Secondary | ICD-10-CM

## 2017-12-04 DIAGNOSIS — E669 Obesity, unspecified: Secondary | ICD-10-CM | POA: Diagnosis not present

## 2017-12-04 DIAGNOSIS — L4050 Arthropathic psoriasis, unspecified: Secondary | ICD-10-CM | POA: Diagnosis not present

## 2017-12-04 DIAGNOSIS — M5136 Other intervertebral disc degeneration, lumbar region: Secondary | ICD-10-CM

## 2017-12-04 LAB — POCT GLYCOSYLATED HEMOGLOBIN (HGB A1C): Hemoglobin A1C: 6 % — AB (ref 4.0–5.6)

## 2017-12-04 MED ORDER — METFORMIN HCL ER 500 MG PO TB24: 1500.0000 mg | ORAL_TABLET | Freq: Every day | ORAL | 1 refills | Status: DC

## 2017-12-04 MED ORDER — PRAVASTATIN SODIUM 40 MG PO TABS: 40.0000 mg | ORAL_TABLET | Freq: Every day | ORAL | 3 refills | Status: DC

## 2017-12-04 MED ORDER — SPIRONOLACTONE-HCTZ 25-25 MG PO TABS: 0.5000 | ORAL_TABLET | Freq: Every day | ORAL | 1 refills | Status: DC

## 2017-12-04 MED ORDER — DICYCLOMINE HCL 20 MG PO TABS: 20.0000 mg | ORAL_TABLET | Freq: Three times a day (TID) | ORAL | 1 refills | Status: DC

## 2017-12-04 MED ORDER — GLUCOSE BLOOD VI STRP: 1.0000 | ORAL_STRIP | Freq: Two times a day (BID) | 2 refills | Status: DC

## 2017-12-04 MED ORDER — GLIPIZIDE ER 10 MG PO TB24: ORAL_TABLET | ORAL | 1 refills | Status: DC

## 2017-12-04 MED ORDER — OLMESARTAN MEDOXOMIL 20 MG PO TABS: 20.0000 mg | ORAL_TABLET | Freq: Every day | ORAL | 1 refills | Status: DC

## 2017-12-04 MED ORDER — GABAPENTIN 100 MG PO CAPS
200.0000 mg | ORAL_CAPSULE | Freq: Every day | ORAL | 4 refills | Status: DC
Start: 2017-12-04 — End: 2019-02-21

## 2017-12-04 MED ORDER — MELOXICAM 7.5 MG PO TABS: 7.5000 mg | ORAL_TABLET | Freq: Two times a day (BID) | ORAL | 0 refills | Status: DC

## 2017-12-04 NOTE — Progress Notes (Signed)
Name: Mary Cantrell   MRN: [id number]    DOB: [birth year]   Date:12/04/2017       Progress Note  Subjective  Chief Complaint  Chief Complaint  Patient presents with  . Diabetes  . Hypertension  . Hyperlipidemia  . Medication Refill    HPI  Polymyalgia rheumatica: Diagnosed in August 2016. Seen Dr. Dossie Der, Rheumatologist in [place]. She is off prednisone  Psoriatic arthritis:" she is under the care of Dr. Dossie Der and is now on methotrexate and states still has stiffness in the mornings also has some crepitus   HTN: bp is as goal, no chest pain or palpitation   Asthma Moderate: seen by pulmonologist Dr. Raul Del,, completed pulmonary rehab Summer 2016, she was doing well, however still has episodes of hypoxemia, and needs to rest, due for follow up with him soon.   Hyperlipidemia: taking medication as prescribed and is doing well, no side effects. We will recheck labs.   DM II gastroparesis: she is taking Metformin 750 mg daily three daily and Glipizide 20 mg XL , one  daily, glucose has been going up around 140, but can go down to 80. She still has some intermittent bloating, doing better now. She is obese,.  IBS: she was diagnosed in her 38's, she used to take Bentyl, symptoms improved after she retired in 2009, still triggered by salads. No blood or mucus in her stools, stable symptoms.    Patient Active Problem List   Diagnosis Date Noted  . Psoriatic arthritis ([place]) 12/04/2017  . Stenosis of left carotid artery 12/16/2016  . Polymyalgia rheumatica ([place]) 04/13/2015  . Benign essential HTN 12/21/2014  . Back pain, chronic 12/21/2014  . Narrowing of intervertebral disc space 12/21/2014  . Diabetic gastroparesis ([place]) 12/21/2014  . Amaurosis fugax of left eye 12/21/2014  . H/O: hypothyroidism 12/21/2014  . Hyperlipidemia 12/21/2014  . IBS (irritable bowel syndrome) 12/21/2014  . Disorder of macula of retina 12/21/2014  . Asthma, moderate 12/21/2014  . NASH (nonalcoholic  steatohepatitis) 12/21/2014  . NS (nuclear sclerosis) 12/21/2014  . Osteoarthrosis 12/21/2014  . Psoriasis 12/21/2014  . Knee torn cartilage 12/21/2014  . Closed dislocation of jaw 12/21/2014  . Diabetes mellitus type 2 in obese ([place]) 12/21/2014  . Obstructive apnea 01/03/2014  . History of shoulder surgery 04/23/2011    Past Surgical History:  Procedure Laterality Date  . ABDOMINAL HYSTERECTOMY    . APPENDECTOMY    . ARTERY BIOPSY N/A 02/23/2015   Procedure: BIOPSY TEMPORAL ARTERY;  Surgeon: Algernon Huxley, MD;  Location: [facility] ORS;  Service: Vascular;  Laterality: N/A;  . BREAST BIOPSY Left   . EXCISION / CURETTAGE BONE CYST FINGER     Left Index Finger  . PILONIDAL CYST EXCISION    . SHOULDER ARTHROSCOPY Right [id number]   Dr. Mauri Pole  . STOMACH SURGERY  1941   3 arteries leaking in stomach after appendectomy  . TONSILLECTOMY      Family History  Problem Relation Age of Onset  . Rheum arthritis Father   . Diabetes Father   . Pneumonia Father   . Pneumonia Mother   . Diabetes Sister   . Osteoarthritis Sister     Social History   Socioeconomic History  . Marital status: Married    Spouse name: Not on file  . Number of children: Not on file  . Years of education: Not on file  . Highest education level: Not on file  Occupational History  . Not on file  Social Needs  .  Financial resource strain: Not on file  . Food insecurity:    Worry: Not on file    Inability: Not on file  . Transportation needs:    Medical: Not on file    Non-medical: Not on file  Tobacco Use  . Smoking status: Never Smoker  . Smokeless tobacco: Never Used  Substance and Sexual Activity  . Alcohol use: No    Alcohol/week: 0.0 oz  . Drug use: No  . Sexual activity: Yes    Partners: Male    Comment: hysterectomy  Lifestyle  . Physical activity:    Days per week: Not on file    Minutes per session: Not on file  . Stress: Not on file  Relationships  . Social connections:    Talks on phone:  Not on file    Gets together: Not on file    Attends religious service: Not on file    Active member of club or organization: Not on file    Attends meetings of clubs or organizations: Not on file    Relationship status: Not on file  . Intimate partner violence:    Fear of current or ex partner: Not on file    Emotionally abused: Not on file    Physically abused: Not on file    Forced sexual activity: Not on file  Other Topics Concern  . Not on file  Social History Narrative  . Not on file     Current Outpatient Medications:  .  albuterol (VENTOLIN HFA) 108 (90 BASE) MCG/ACT inhaler, Inhale 2 puffs into the lungs every 4 (four) hours as needed for wheezing (takes every am and then as needed.). , Disp: , Rfl:  .  aspirin 81 MG tablet, Take 81 mg by mouth every morning. , Disp: , Rfl:  .  Azelastine HCl 0.15 % SOLN, , Disp: , Rfl:  .  Calcium Carbonate-Vit D-Min ([employer] CALCIUM 600 + D/MINERALS) 600-400 MG-UNIT TABS, Take 1 tablet by mouth daily., Disp: , Rfl: 1 .  Cinnamon 500 MG capsule, Take 1 capsule by mouth 2 (two) times daily., Disp: , Rfl:  .  cyanocobalamin 100 MCG tablet, Take 100 mcg by mouth every morning. , Disp: , Rfl:  .  dicyclomine (BENTYL) 20 MG tablet, Take 1 tablet (20 mg total) by mouth 4 (four) times daily -  before meals and at bedtime., Disp: 100 tablet, Rfl: 1 .  fluticasone (FLONASE) 50 MCG/ACT nasal spray, USE 2 SPRAYS IN EACH NOSTRIL AT BEDTIME, Disp: 48 g, Rfl: 1 .  folic acid (FOLVITE) 1 MG tablet, , Disp: , Rfl:  .  gabapentin (NEURONTIN) 100 MG capsule, Take 2 capsules (200 mg total) by mouth at bedtime., Disp: 180 capsule, Rfl: 4 .  glipiZIDE (GLUCOTROL XL) 10 MG 24 hr tablet, Two daily, Disp: 180 tablet, Rfl: 1 .  glucose blood (ONE TOUCH ULTRA TEST) test strip, 1 each by Other route 2 (two) times daily. DX: E11.69, E11.43, Disp: 100 each, Rfl: 2 .  meloxicam (MOBIC) 7.5 MG tablet, Take 1 tablet (7.5 mg total) by mouth 2 (two) times daily., Disp: 180  tablet, Rfl: 0 .  metFORMIN (GLUCOPHAGE-XR) 500 MG 24 hr tablet, Take 3 tablets (1,500 mg total) by mouth daily., Disp: 270 tablet, Rfl: 1 .  methotrexate (RHEUMATREX) 2.5 MG tablet, Take 1 tablet (2.5 mg total) by mouth once a week. Caution:Chemotherapy. Protect from light., Disp: 6 tablet, Rfl: 0 .  olmesartan (BENICAR) 20 MG tablet, Take 1 tablet (20 mg  total) by mouth daily., Disp: 90 tablet, Rfl: 1 .  ONE TOUCH ULTRA TEST test strip, USE 1 STRIP 2 (TWO) TIMES DAILY. E11.69, E11.43, Disp: 100 each, Rfl: 2 .  pravastatin (PRAVACHOL) 40 MG tablet, Take 1 tablet (40 mg total) by mouth daily., Disp: 90 tablet, Rfl: 3 .  spironolactone-hydrochlorothiazide (ALDACTAZIDE) 25-25 MG tablet, Take 0.5 tablets by mouth daily., Disp: 45 tablet, Rfl: 1 .  vitamin C (ASCORBIC ACID) 500 MG tablet, Take 1 tablet by mouth at bedtime. , Disp: , Rfl:  .  budesonide-formoterol (SYMBICORT) 160-4.5 MCG/ACT inhaler, Inhale 2 puffs into the lungs every morning., Disp: , Rfl:  .  montelukast (SINGULAIR) 10 MG tablet, Take by mouth., Disp: , Rfl:   Allergies  Allergen Reactions  . Codeine     Other reaction(s): Other (See Comments) Dizzy  . Lantus  [Insulin Glargine]     made skin hot  . Metformin     Coated metformin is tolerated well.  Marland Kitchen Penicillin G     Other reaction(s): Unknown  . Sitagliptin   . Sulfa Antibiotics     Other reaction(s): Other (See Comments) Red burning skin     ROS  Constitutional: Negative for fever or weight change.  Respiratory: Negative for cough and shortness of breath.   Cardiovascular: Negative for chest pain or palpitations.  Gastrointestinal: Negative for abdominal pain, no bowel changes.  Musculoskeletal: Negative for gait problem or joint swelling.  Skin: Negative for rash.  Neurological: Negative for dizziness or headache.  No other specific complaints in a complete review of systems (except as listed in HPI above).   Objective  Vitals:   12/04/17 1308  BP:  130/70  Pulse: 83  Resp: 16  SpO2: 96%  Weight: 184 lb 3.2 oz (83.6 kg)  Height: 5' 6"  (1.676 m)    Body mass index is 29.73 kg/m.  Physical Exam  Constitutional: Patient appears well-developed and well-nourished. Obese No distress.  HEENT: head atraumatic, normocephalic, pupils equal and reactive to light, neck supple, throat within normal limits Cardiovascular: Normal rate, regular rhythm and normal heart sounds.  No murmur heard. No BLE edema. Pulmonary/Chest: Effort normal and breath sounds normal. No respiratory distress. Abdominal: Soft.  There is no tenderness. Psychiatric: Patient has a normal mood and affect. behavior is normal. Judgment and thought content normal.  Diabetic Foot Exam: Diabetic Foot Exam - Simple   Simple Foot Form Diabetic Foot exam was performed with the following findings:  Yes 12/04/2017  1:52 PM  Visual Inspection No deformities, no ulcerations, no other skin breakdown bilaterally:  Yes Sensation Testing Intact to touch and monofilament testing bilaterally:  Yes Pulse Check Posterior Tibialis and Dorsalis pulse intact bilaterally:  Yes Comments      PHQ2/9: Depression screen [facility] 2/9 12/04/2017 01/07/2017 06/26/2016 03/13/2016 10/31/2015  Decreased Interest 1 0 2 0 0  Down, Depressed, Hopeless 0 0 0 0 0  PHQ - 2 Score 1 0 2 0 0  Altered sleeping 0 - 0 - -  Tired, decreased energy 1 - 3 - -  Change in appetite 1 - 3 - -  Feeling bad or failure about yourself  0 - 0 - -  Trouble concentrating 0 - 0 - -  Moving slowly or fidgety/restless 0 - 0 - -  Suicidal thoughts 0 - 0 - -  PHQ-9 Score 3 - 8 - -  Difficult doing work/chores Not difficult at all - Somewhat difficult - -     Fall Risk:  Fall Risk  12/04/2017 01/07/2017 06/26/2016 03/13/2016 10/31/2015  Falls in the past year? Yes Yes No Yes No  Comment - - - - -  Number falls in past yr: 1 1 - 1 -  Injury with Fall? Yes Yes - Yes -  Comment - - - Hurt her left elbow -  Risk for fall due to : - -  - - -  Follow up Falls prevention discussed - - - -     Functional Status Survey: Is the patient deaf or have difficulty hearing?: No Does the patient have difficulty seeing, even when wearing glasses/contacts?: No Does the patient have difficulty concentrating, remembering, or making decisions?: No Does the patient have difficulty walking or climbing stairs?: No Does the patient have difficulty dressing or bathing?: No Does the patient have difficulty doing errands alone such as visiting a doctor's office or shopping?: No   Assessment & Plan  1. Diabetes mellitus type 2 in obese ([place])  Advised to try going down on glipizide and monitor glucose A1C is at goal and there is risk of hypoglycemia - glipiZIDE (GLUCOTROL XL) 10 MG 24 hr tablet; Two daily  Dispense: 180 tablet; Refill: 1 - metFORMIN (GLUCOPHAGE-XR) 500 MG 24 hr tablet; Take 3 tablets (1,500 mg total) by mouth daily.  Dispense: 270 tablet; Refill: 1 - glucose blood (ONE TOUCH ULTRA TEST) test strip; 1 each by Other route 2 (two) times daily. DX: E11.69, E11.43  Dispense: 100 each; Refill: 2 - Lipid panel - POCT HgB A1C - Urine Microalbumin w/creat. ratio  2. Psoriatic arthritis ([facility])  - meloxicam (MOBIC) 7.5 MG tablet; Take 1 tablet (7.5 mg total) by mouth 2 (two) times daily.  Dispense: 180 tablet; Refill: 0  3. DDD (degenerative disc disease), lumbar  - gabapentin (NEURONTIN) 100 MG capsule; Take 2 capsules (200 mg total) by mouth at bedtime.  Dispense: 180 capsule; Refill: 4  4. Diabetes mellitus with gastroparesis ([facility])  - glipiZIDE (GLUCOTROL XL) 10 MG 24 hr tablet; Two daily  Dispense: 180 tablet; Refill: 1 - metFORMIN (GLUCOPHAGE-XR) 500 MG 24 hr tablet; Take 3 tablets (1,500 mg total) by mouth daily.  Dispense: 270 tablet; Refill: 1 - POCT HgB A1C - Urine Microalbumin w/creat. ratio  5. Hypertension, benign  - olmesartan (BENICAR) 20 MG tablet; Take 1 tablet (20 mg total) by mouth daily.  Dispense: 90  tablet; Refill: 1 - spironolactone-hydrochlorothiazide (ALDACTAZIDE) 25-25 MG tablet; Take 0.5 tablets by mouth daily.  Dispense: 45 tablet; Refill: 1  6. Dyslipidemia associated with type 2 diabetes mellitus (HCC)  - pravastatin (PRAVACHOL) 40 MG tablet; Take 1 tablet (40 mg total) by mouth daily.  Dispense: 90 tablet; Refill: 3 - POCT HgB A1C - Urine Microalbumin w/creat. ratio  7. Irritable bowel syndrome with diarrhea  - dicyclomine (BENTYL) 20 MG tablet; Take 1 tablet (20 mg total) by mouth 4 (four) times daily -  before meals and at bedtime.  Dispense: 100 tablet; Refill: 1  8. Colon cancer screening  - Cologuard  9. Benign essential HTN  - COMPLETE METABOLIC PANEL WITH GFR

## 2017-12-05 ENCOUNTER — Encounter: Payer: Self-pay | Admitting: Family Medicine

## 2017-12-05 LAB — COMPLETE METABOLIC PANEL WITH GFR
AG Ratio: 2.5 (calc) (ref 1.0–2.5)
ALT: 30 U/L — AB (ref 6–29)
AST: 31 U/L (ref 10–35)
Albumin: 4.7 g/dL (ref 3.6–5.1)
Alkaline phosphatase (APISO): 83 U/L (ref 33–130)
BILIRUBIN TOTAL: 0.5 mg/dL (ref 0.2–1.2)
BUN/Creatinine Ratio: 24 (calc) — ABNORMAL HIGH (ref 6–22)
BUN: 24 mg/dL (ref 7–25)
CALCIUM: 9.9 mg/dL (ref 8.6–10.4)
CO2: 27 mmol/L (ref 20–32)
Chloride: 103 mmol/L (ref 98–110)
Creat: 0.99 mg/dL — ABNORMAL HIGH (ref 0.60–0.93)
GFR, EST AFRICAN AMERICAN: 66 mL/min/{1.73_m2} (ref >=60)
GFR, Est Non African American: 57 mL/min/{1.73_m2} — ABNORMAL LOW (ref >=60)
GLUCOSE: 126 mg/dL (ref 65–139)
Globulin: 1.9 g/dL (calc) (ref 1.9–3.7)
Potassium: 3.8 mmol/L (ref 3.5–5.3)
Sodium: 141 mmol/L (ref 135–146)
TOTAL PROTEIN: 6.6 g/dL (ref 6.1–8.1)

## 2017-12-05 LAB — LIPID PANEL
Cholesterol: 149 mg/dL (ref <200)
HDL: 43 mg/dL — ABNORMAL LOW (ref >50)
LDL CHOLESTEROL (CALC): 76 mg/dL
Non-HDL Cholesterol (Calc): 106 mg/dL (calc) (ref <130)
TRIGLYCERIDES: 199 mg/dL — AB (ref <150)
Total CHOL/HDL Ratio: 3.5 (calc) (ref <5.0)

## 2017-12-05 LAB — MICROALBUMIN / CREATININE URINE RATIO
CREATININE, URINE: 75 mg/dL (ref 20–275)
Microalb Creat Ratio: 8 mcg/mg creat (ref <30)
Microalb, Ur: 0.6 mg/dL

## 2017-12-08 ENCOUNTER — Encounter: Payer: Self-pay | Admitting: Family Medicine

## 2017-12-09 ENCOUNTER — Encounter: Payer: Self-pay | Admitting: Family Medicine

## 2017-12-25 LAB — COLOGUARD: COLOGUARD: NEGATIVE

## 2018-01-14 ENCOUNTER — Telehealth: Payer: Self-pay

## 2018-01-14 NOTE — Telephone Encounter (Signed)
Left a message on her answering machine explaining, barely below normal and that she does not need to see nephrologist at this time. I apologized for given her the results by e-mail

## 2018-01-14 NOTE — Telephone Encounter (Signed)
Copied from [place] [id number]. Topic: Referral - Request >> Jan 14, 2018  1:52 PM Conception Chancy, NT wrote: Reason for CRM:  patient is calling and requesting a referral to a kidney Doctor to Dr. Lynnea Ferrier in [place]. Patient also states she is unhappy nobody called her about her lab results wqhen she has a stage 3 kidney disease. Please advise.   Please advise

## 2018-02-17 ENCOUNTER — Ambulatory Visit (INDEPENDENT_AMBULATORY_CARE_PROVIDER_SITE_OTHER): Payer: Medicare Other

## 2018-02-17 ENCOUNTER — Ambulatory Visit (INDEPENDENT_AMBULATORY_CARE_PROVIDER_SITE_OTHER): Payer: Medicare Other | Admitting: Vascular Surgery

## 2018-02-17 ENCOUNTER — Encounter (INDEPENDENT_AMBULATORY_CARE_PROVIDER_SITE_OTHER): Payer: Self-pay | Admitting: Vascular Surgery

## 2018-02-17 VITALS — BP 134/78 | HR 70 | Resp 16 | Ht 66.0 in | Wt 182.8 lb

## 2018-02-17 DIAGNOSIS — I6522 Occlusion and stenosis of left carotid artery: Secondary | ICD-10-CM | POA: Diagnosis not present

## 2018-02-17 DIAGNOSIS — E1169 Type 2 diabetes mellitus with other specified complication: Secondary | ICD-10-CM | POA: Diagnosis not present

## 2018-02-17 DIAGNOSIS — E7800 Pure hypercholesterolemia, unspecified: Secondary | ICD-10-CM

## 2018-02-17 DIAGNOSIS — I10 Essential (primary) hypertension: Secondary | ICD-10-CM

## 2018-02-17 DIAGNOSIS — E669 Obesity, unspecified: Secondary | ICD-10-CM

## 2018-02-17 NOTE — Assessment & Plan Note (Signed)
blood glucose control important in reducing the progression of atherosclerotic disease. Also, involved in wound healing. On appropriate medications.  

## 2018-02-17 NOTE — Progress Notes (Signed)
MRN : [id number]  Mary Cantrell is a 72 y.o. (1945-07-29) female who presents with chief complaint of  Chief Complaint  Patient presents with  . Follow-up    48monthcarotid   .  History of Present Illness: Patient returns in follow-up of her carotid disease.  She has been diagnosed with mild chronic kidney disease since her last visit with a creatinine clearance of about 57.  Otherwise, she has had to restart her prednisone due to her PMR.  She is trying to cut back on her meloxicam.  She has not had any focal neurologic symptoms.  Her carotid duplex today just shows intimal thickening with nearly normal carotid arteries and mild common carotid artery disease bilaterally.  Current Outpatient Medications  Medication Sig Dispense Refill  . albuterol (VENTOLIN HFA) 108 (90 BASE) MCG/ACT inhaler Inhale 2 puffs into the lungs every 4 (four) hours as needed for wheezing (takes every am and then as needed.).     .Marland Kitchenaspirin 81 MG tablet Take 81 mg by mouth every morning.     . Azelastine HCl 0.15 % SOLN     . budesonide-formoterol (SYMBICORT) 160-4.5 MCG/ACT inhaler Inhale 2 puffs into the lungs every morning.    . Calcium Carbonate-Vit D-Min ([employer] CALCIUM 600 + D/MINERALS) 600-400 MG-UNIT TABS Take 1 tablet by mouth daily.  1  . cyanocobalamin 100 MCG tablet Take 100 mcg by mouth every morning.     . dicyclomine (BENTYL) 20 MG tablet Take 1 tablet (20 mg total) by mouth 4 (four) times daily -  before meals and at bedtime. 100 tablet 1  . fluticasone (FLONASE) 50 MCG/ACT nasal spray USE 2 SPRAYS IN EACH NOSTRIL AT BEDTIME 48 g 1  . folic acid (FOLVITE) 1 MG tablet     . gabapentin (NEURONTIN) 100 MG capsule Take 2 capsules (200 mg total) by mouth at bedtime. 180 capsule 4  . glipiZIDE (GLUCOTROL XL) 10 MG 24 hr tablet Two daily 180 tablet 1  . glucose blood (ONE TOUCH ULTRA TEST) test strip 1 each by Other route 2 (two) times daily. DX: E11.69, E11.43 100 each 2  . losartan (COZAAR) 50 MG tablet       . meloxicam (MOBIC) 7.5 MG tablet Take 1 tablet (7.5 mg total) by mouth 2 (two) times daily. (Patient taking differently: Take 7.5 mg by mouth daily. ) 180 tablet 0  . metFORMIN (GLUCOPHAGE-XR) 500 MG 24 hr tablet Take 3 tablets (1,500 mg total) by mouth daily. 270 tablet 1  . methotrexate (RHEUMATREX) 2.5 MG tablet Take 1 tablet (2.5 mg total) by mouth once a week. Caution:Chemotherapy. Protect from light. 6 tablet 0  . montelukast (SINGULAIR) 10 MG tablet Take by mouth.    .Marland Kitchenomeprazole (PRILOSEC) 40 MG capsule Take 40 mg by mouth daily.    . ONE TOUCH ULTRA TEST test strip USE 1 STRIP 2 (TWO) TIMES DAILY. E11.69, E11.43 100 each 2  . pravastatin (PRAVACHOL) 40 MG tablet Take 1 tablet (40 mg total) by mouth daily. 90 tablet 3  . predniSONE (DELTASONE) 2.5 MG tablet TAKE 1 TABLET BY MOUTH WITH FOOD OR MILK ONCE A DAY    . spironolactone-hydrochlorothiazide (ALDACTAZIDE) 25-25 MG tablet Take 0.5 tablets by mouth daily. 45 tablet 1  . tiZANidine (ZANAFLEX) 4 MG capsule Take 4 mg by mouth 3 (three) times daily as needed for muscle spasms.    . vitamin C (ASCORBIC ACID) 500 MG tablet Take 1 tablet by mouth at bedtime.     .Marland Kitchen  Cinnamon 500 MG capsule Take 1 capsule by mouth 2 (two) times daily.    Marland Kitchen olmesartan (BENICAR) 20 MG tablet Take 1 tablet (20 mg total) by mouth daily. (Patient not taking: Reported on 02/17/2018) 90 tablet 1   No current facility-administered medications for this visit.     Past Medical History:  Diagnosis Date  . Achilles tendinitis of right lower extremity   . Allergy   . Asthma    Moderate  . Chronic back pain   . Depression   . Gastroparesis    DM  . Hyperlipidemia   . Hypertension   . Hypoxemia   . Irritable bowel syndrome   . Maculopathy   . NASH (nonalcoholic steatohepatitis)   . Nuclear sclerosis of both eyes   . OA (osteoarthritis)    Hips  . OSA (obstructive sleep apnea)   . Post-void dribbling   . Psoriasis   . Regurgitation   . SOB (shortness of  breath)   . Tear of left rotator cuff   . TMJ (dislocation of temporomandibular joint)   . Trochanteric bursitis of right hip     Past Surgical History:  Procedure Laterality Date  . ABDOMINAL HYSTERECTOMY    . APPENDECTOMY    . ARTERY BIOPSY N/A 02/23/2015   Procedure: BIOPSY TEMPORAL ARTERY;  Surgeon: Algernon Huxley, MD;  Location: [facility] ORS;  Service: Vascular;  Laterality: N/A;  . BREAST BIOPSY Left   . EXCISION / CURETTAGE BONE CYST FINGER     Left Index Finger  . PILONIDAL CYST EXCISION    . SHOULDER ARTHROSCOPY Right [id number]   Dr. Mauri Pole  . STOMACH SURGERY  1941   3 arteries leaking in stomach after appendectomy  . TONSILLECTOMY      Social History Social History   Tobacco Use  . Smoking status: Never Smoker  . Smokeless tobacco: Never Used  Substance Use Topics  . Alcohol use: No    Alcohol/week: 0.0 standard drinks  . Drug use: No     Family History Family History  Problem Relation Age of Onset  . Rheum arthritis Father   . Diabetes Father   . Pneumonia Father   . Pneumonia Mother   . Diabetes Sister   . Osteoarthritis Sister     Allergies  Allergen Reactions  . Codeine     Other reaction(s): Other (See Comments) Dizzy  . Lantus  [Insulin Glargine]     made skin hot  . Metformin     Coated metformin is tolerated well.  Marland Kitchen Penicillin G     Other reaction(s): Unknown  . Sitagliptin   . Sulfa Antibiotics     Other reaction(s): Other (See Comments) Red burning skin     REVIEW OF SYSTEMS (Negative unless checked)  Constitutional: [] Weight loss  [] Fever  [] Chills Cardiac: [] Chest pain   [] Chest pressure   [] Palpitations   [] Shortness of breath when laying flat   [] Shortness of breath at rest   [] Shortness of breath with exertion. Vascular:  [] Pain in legs with walking   [] Pain in legs at rest   [] Pain in legs when laying flat   [] Claudication   [] Pain in feet when walking  [] Pain in feet at rest  [] Pain in feet when laying flat   [] History of DVT    [] Phlebitis   [] Swelling in legs   [] Varicose veins   [] Non-healing ulcers Pulmonary:   [] Uses home oxygen   [] Productive cough   [] Hemoptysis   [] Wheeze  [] COPD   []   Asthma Neurologic:  [] Dizziness  [] Blackouts   [] Seizures   [] History of stroke   [] History of TIA  [] Aphasia   [] Temporary blindness   [] Dysphagia   [] Weakness or numbness in arms   [] Weakness or numbness in legs Musculoskeletal:  [x] Arthritis   [] Joint swelling   [] Joint pain   [] Low back pain Hematologic:  [] Easy bruising  [] Easy bleeding   [] Hypercoagulable state   [] Anemic  [] Hepatitis Gastrointestinal:  [] Blood in stool   [] Vomiting blood  [x] Gastroesophageal reflux/heartburn   [] Difficulty swallowing. Genitourinary:  [x] Chronic kidney disease   [] Difficult urination  [] Frequent urination  [] Burning with urination   [] Blood in urine Skin:  [] Rashes   [] Ulcers   [] Wounds Psychological:  [] History of anxiety   []  History of major depression.  Physical Examination  Vitals:   02/17/18 1438  BP: 134/78  Pulse: 70  Resp: 16  Weight: 182 lb 12.8 oz (82.9 kg)  Height: 5' 6"  (1.676 m)   Body mass index is 29.5 kg/m. Gen:  WD/WN, NAD. Appears younger than stated age. Head: [place]/AT, No temporalis wasting. Ear/Nose/Throat: Hearing grossly intact, nares w/o erythema or drainage, trachea midline Eyes: Conjunctiva clear. Sclera non-icteric Neck: Supple.  No bruit  Pulmonary:  Good air movement, equal and clear to auscultation bilaterally.  Cardiac: RRR, No JVD Vascular:  Vessel Right Left  Radial Palpable Palpable                                    Musculoskeletal: M/S 5/5 throughout.  No deformity or atrophy. No edema. Neurologic: CN 2-12 intact. Sensation grossly intact in extremities.  Symmetrical.  Speech is fluent. Motor exam as listed above. Psychiatric: Judgment intact, Mood & affect appropriate for pt's clinical situation. Dermatologic: No rashes or ulcers noted.  No cellulitis or open  wounds.      CBC Lab Results  Component Value Date   WBC 7.4 10/07/2016   HGB 13.0 10/07/2016   HCT 39 10/07/2016   MCV 87 02/10/2015   PLT 282 10/07/2016    BMET    Component Value Date/Time   NA 141 12/04/2017 1401   NA 143 10/07/2016   NA 139 12/31/2011 1122   K 3.8 12/04/2017 1401   K 3.5 12/31/2011 1122   CL 103 12/04/2017 1401   CL 101 12/31/2011 1122   CO2 27 12/04/2017 1401   CO2 26 12/31/2011 1122   GLUCOSE 126 12/04/2017 1401   GLUCOSE 163 (H) 12/31/2011 1122   BUN 24 12/04/2017 1401   BUN 17 10/07/2016   BUN 30 (H) 12/31/2011 1122   CREATININE 0.99 (H) 12/04/2017 1401   CALCIUM 9.9 12/04/2017 1401   CALCIUM 9.5 12/31/2011 1122   GFRNONAA 57 (L) 12/04/2017 1401   GFRAA 66 12/04/2017 1401   CrCl cannot be calculated (Patient's most recent lab result is older than the maximum 21 days allowed.).  COAG Lab Results  Component Value Date   INR 0.94 02/22/2015    Radiology No results found.   Assessment/Plan Benign essential HTN blood pressure control important in reducing the progression of atherosclerotic disease. On appropriate oral medications.   Hyperlipidemia lipid control important in reducing the progression of atherosclerotic disease. Continue statin therapy   Diabetes mellitus type 2 in obese blood glucose control important in reducing the progression of atherosclerotic disease. Also, involved in wound healing. On appropriate medications.   Stenosis of left carotid artery Her carotid duplex today  just shows intimal thickening with nearly normal carotid arteries and mild common carotid artery disease bilaterally. She will continue her 81 mg aspirin daily and her Pravachol. I think stretching out her follow-up visits to every other year would be fine at this point.    Leotis Pain, MD  02/17/2018 3:13 PM    This note was created with Dragon medical transcription system.  Any errors from dictation are purely unintentional

## 2018-02-17 NOTE — Assessment & Plan Note (Signed)
blood pressure control important in reducing the progression of atherosclerotic disease. On appropriate oral medications.  

## 2018-02-17 NOTE — Assessment & Plan Note (Signed)
Her carotid duplex today just shows intimal thickening with nearly normal carotid arteries and mild common carotid artery disease bilaterally. She will continue her 81 mg aspirin daily and her Pravachol. I think stretching out her follow-up visits to every other year would be fine at this point.

## 2018-02-17 NOTE — Assessment & Plan Note (Signed)
lipid control important in reducing the progression of atherosclerotic disease. Continue statin therapy  

## 2018-02-24 ENCOUNTER — Telehealth: Payer: Self-pay

## 2018-02-24 DIAGNOSIS — E1169 Type 2 diabetes mellitus with other specified complication: Secondary | ICD-10-CM

## 2018-02-24 DIAGNOSIS — E669 Obesity, unspecified: Principal | ICD-10-CM

## 2018-02-24 NOTE — Telephone Encounter (Signed)
Referral was placed, but it will take a while, she needs to come in sooner to see me if glucose is out of control

## 2018-02-24 NOTE — Telephone Encounter (Signed)
Copied from [place] [id number]. Topic: Referral - Request >> Feb 24, 2018  4:15 PM Sheran Luz wrote: Reason for CRM: Pt called stating that her rheumatologist Valinda Party, MD recommended that she get a referral to an endocrinologist for rising blood sugar levels.  I called this patient to inform her that the referral has been placed and to see if she needed to be seen her prior to getting in with the endocrinologist and she said no. Patient was then told that she would get a call from their [facility] ENDO) office with an appt date and time.

## 2018-02-27 ENCOUNTER — Encounter: Payer: Self-pay | Admitting: Podiatry

## 2018-02-27 ENCOUNTER — Ambulatory Visit (INDEPENDENT_AMBULATORY_CARE_PROVIDER_SITE_OTHER): Payer: Medicare Other | Admitting: Podiatry

## 2018-02-27 DIAGNOSIS — L600 Ingrowing nail: Secondary | ICD-10-CM | POA: Diagnosis not present

## 2018-03-03 ENCOUNTER — Telehealth: Payer: Self-pay

## 2018-03-03 MED ORDER — DOXYCYCLINE HYCLATE 100 MG PO TABS: 100.0000 mg | ORAL_TABLET | Freq: Two times a day (BID) | ORAL | 0 refills | Status: DC

## 2018-03-03 NOTE — Progress Notes (Signed)
   Subjective: Patient presents today for evaluation of pain to the lateral border of the right hallux that began 1-2 weeks ago. Patient is concerned for possible ingrown nail. Wearing shoes increases the pain. She has been soaking the foot in Epsom salt for treatment. She reports she is currently taking Prednisone for psoriatic arthritis. Patient presents today for further treatment and evaluation.  Past Medical History:  Diagnosis Date  . Achilles tendinitis of right lower extremity   . Allergy   . Asthma    Moderate  . Chronic back pain   . Depression   . Gastroparesis    DM  . Hyperlipidemia   . Hypertension   . Hypoxemia   . Irritable bowel syndrome   . Maculopathy   . NASH (nonalcoholic steatohepatitis)   . Nuclear sclerosis of both eyes   . OA (osteoarthritis)    Hips  . OSA (obstructive sleep apnea)   . Post-void dribbling   . Psoriasis   . Regurgitation   . SOB (shortness of breath)   . Tear of left rotator cuff   . TMJ (dislocation of temporomandibular joint)   . Trochanteric bursitis of right hip     Objective:  General: Well developed, nourished, in no acute distress, alert and oriented x3   Dermatology: Skin is warm, dry and supple bilateral. Lateral border right hallux appears to be erythematous with evidence of an ingrowing nail. Pain on palpation noted to the border of the nail fold. The remaining nails appear unremarkable at this time. There are no open sores, lesions.  Vascular: Dorsalis Pedis artery and Posterior Tibial artery pedal pulses palpable. No lower extremity edema noted.   Neruologic: Grossly intact via light touch bilateral.  Musculoskeletal: Muscular strength within normal limits in all groups bilateral. Normal range of motion noted to all pedal and ankle joints.   Assesement: #1 Paronychia with ingrowing nail lateral border right hallux  #2 Pain in toe #3 Incurvated nail  Plan of Care:  1. Patient evaluated.  2. Discussed treatment  alternatives and plan of care. Explained nail avulsion procedure and post procedure course to patient. 3. Patient opted for permanent partial nail avulsion.  4. Prior to procedure, local anesthesia infiltration utilized using 3 ml of a 50:50 mixture of 2% plain lidocaine and 0.5% plain marcaine in a normal hallux block fashion and a betadine prep performed.  5. Partial permanent nail avulsion with chemical matrixectomy performed using [id number] applications of phenol followed by alcohol flush.  6. Light dressing applied. 7. Return to clinic in 2 weeks.   Edrick Kins, DPM [facility]  Dr. Edrick Kins, [address]                                        [place], [place] [address]                Office [phone]  Fax [phone]

## 2018-03-03 NOTE — Telephone Encounter (Signed)
Patient called stating that she had ingrown toenail cut out on Friday.  She reports she is diabetic and her toe is starting to get red, swollen and very sore to touch.  I spoke with Dr. Amalia Hailey and per his verbal order, its ok to call in Doxycycline.  Patient has been made aware of rx and she will continue to soak and use antibiotic cream on toe as directed.  Script has been sent to her pharmacy.

## 2018-03-05 ENCOUNTER — Ambulatory Visit (INDEPENDENT_AMBULATORY_CARE_PROVIDER_SITE_OTHER): Payer: Medicare Other | Admitting: Podiatry

## 2018-03-05 DIAGNOSIS — L600 Ingrowing nail: Secondary | ICD-10-CM

## 2018-03-05 NOTE — Progress Notes (Signed)
Subjective:  Patient ID: Mary Cantrell, female    DOB: May 26, 1946,  MRN: [id number]  Chief Complaint  Patient presents with  . Nail Problem    possible infected toenail  right hallux - ingrown procedure done last Friday - taking oral antibiotics x 2 days    72 y.o. female presents with the above complaint.  Underwent correction of her right great toe ingrown toenail on 02/27/2018.  States that 2 days ago the toe became very red and swollen and painful with drainage.  Benign about the past 2 days.  Still still slightly red.  On prednisone for osteoarthritis.   Review of Systems: Negative except as noted in the HPI. Denies N/V/F/Ch.  Past Medical History:  Diagnosis Date  . Achilles tendinitis of right lower extremity   . Allergy   . Asthma    Moderate  . Chronic back pain   . Depression   . Gastroparesis    DM  . Hyperlipidemia   . Hypertension   . Hypoxemia   . Irritable bowel syndrome   . Maculopathy   . NASH (nonalcoholic steatohepatitis)   . Nuclear sclerosis of both eyes   . OA (osteoarthritis)    Hips  . OSA (obstructive sleep apnea)   . Post-void dribbling   . Psoriasis   . Regurgitation   . SOB (shortness of breath)   . Tear of left rotator cuff   . TMJ (dislocation of temporomandibular joint)   . Trochanteric bursitis of right hip     Current Outpatient Medications:  .  albuterol (VENTOLIN HFA) 108 (90 BASE) MCG/ACT inhaler, Inhale 2 puffs into the lungs every 4 (four) hours as needed for wheezing (takes every am and then as needed.). , Disp: , Rfl:  .  aspirin 81 MG tablet, Take 81 mg by mouth every morning. , Disp: , Rfl:  .  Azelastine HCl 0.15 % SOLN, , Disp: , Rfl:  .  budesonide-formoterol (SYMBICORT) 160-4.5 MCG/ACT inhaler, Inhale 2 puffs into the lungs every morning., Disp: , Rfl:  .  Calcium Carbonate-Vit D-Min ([employer] CALCIUM 600 + D/MINERALS) 600-400 MG-UNIT TABS, Take 1 tablet by mouth daily., Disp: , Rfl: 1 .  Cinnamon 500 MG capsule, Take 1 capsule by  mouth 2 (two) times daily., Disp: , Rfl:  .  cyanocobalamin 100 MCG tablet, Take 100 mcg by mouth every morning. , Disp: , Rfl:  .  dicyclomine (BENTYL) 20 MG tablet, Take 1 tablet (20 mg total) by mouth 4 (four) times daily -  before meals and at bedtime., Disp: 100 tablet, Rfl: 1 .  doxycycline (VIBRA-TABS) 100 MG tablet, Take 1 tablet (100 mg total) by mouth 2 (two) times daily., Disp: 20 tablet, Rfl: 0 .  fluticasone (FLONASE) 50 MCG/ACT nasal spray, USE 2 SPRAYS IN EACH NOSTRIL AT BEDTIME, Disp: 48 g, Rfl: 1 .  folic acid (FOLVITE) 1 MG tablet, , Disp: , Rfl:  .  gabapentin (NEURONTIN) 100 MG capsule, Take 2 capsules (200 mg total) by mouth at bedtime., Disp: 180 capsule, Rfl: 4 .  glipiZIDE (GLUCOTROL XL) 10 MG 24 hr tablet, Two daily, Disp: 180 tablet, Rfl: 1 .  glucose blood (ONE TOUCH ULTRA TEST) test strip, 1 each by Other route 2 (two) times daily. DX: E11.69, E11.43, Disp: 100 each, Rfl: 2 .  losartan (COZAAR) 50 MG tablet, , Disp: , Rfl:  .  meloxicam (MOBIC) 7.5 MG tablet, Take 1 tablet (7.5 mg total) by mouth 2 (two) times daily. (Patient taking differently:  Take 7.5 mg by mouth daily. ), Disp: 180 tablet, Rfl: 0 .  metFORMIN (GLUCOPHAGE-XR) 500 MG 24 hr tablet, Take 3 tablets (1,500 mg total) by mouth daily., Disp: 270 tablet, Rfl: 1 .  methotrexate (RHEUMATREX) 2.5 MG tablet, Take 1 tablet (2.5 mg total) by mouth once a week. Caution:Chemotherapy. Protect from light., Disp: 6 tablet, Rfl: 0 .  montelukast (SINGULAIR) 10 MG tablet, Take by mouth., Disp: , Rfl:  .  olmesartan (BENICAR) 20 MG tablet, Take 1 tablet (20 mg total) by mouth daily. (Patient not taking: Reported on 02/17/2018), Disp: 90 tablet, Rfl: 1 .  omeprazole (PRILOSEC) 40 MG capsule, Take 40 mg by mouth daily., Disp: , Rfl:  .  ONE TOUCH ULTRA TEST test strip, USE 1 STRIP 2 (TWO) TIMES DAILY. E11.69, E11.43, Disp: 100 each, Rfl: 2 .  pravastatin (PRAVACHOL) 40 MG tablet, Take 1 tablet (40 mg total) by mouth daily.,  Disp: 90 tablet, Rfl: 3 .  predniSONE (DELTASONE) 2.5 MG tablet, TAKE 1 TABLET BY MOUTH WITH FOOD OR MILK ONCE A DAY, Disp: , Rfl:  .  spironolactone-hydrochlorothiazide (ALDACTAZIDE) 25-25 MG tablet, Take 0.5 tablets by mouth daily., Disp: 45 tablet, Rfl: 1 .  tiZANidine (ZANAFLEX) 4 MG capsule, Take 4 mg by mouth 3 (three) times daily as needed for muscle spasms., Disp: , Rfl:  .  vitamin C (ASCORBIC ACID) 500 MG tablet, Take 1 tablet by mouth at bedtime. , Disp: , Rfl:   Social History   Tobacco Use  Smoking Status Never Smoker  Smokeless Tobacco Never Used    Allergies  Allergen Reactions  . Codeine     Other reaction(s): Other (See Comments) Dizzy  . Lantus  [Insulin Glargine]     made skin hot  . Metformin     Coated metformin is tolerated well.  Marland Kitchen Penicillin G     Other reaction(s): Unknown  . Sitagliptin   . Sulfa Antibiotics     Other reaction(s): Other (See Comments) Red burning skin   Objective:  There were no vitals filed for this visit. There is no height or weight on file to calculate BMI. Constitutional Well developed. Well nourished.  Vascular Dorsalis pedis pulses palpable bilaterally. Posterior tibial pulses palpable bilaterally. Capillary refill normal to all digits.  No cyanosis or clubbing noted. Pedal hair growth normal.  Neurologic Normal speech. Oriented to person, place, and time. Epicritic sensation to light touch grossly present bilaterally.  Dermatologic Painful ingrowing nail at lateral nail borders of the hallux nail right.  Erythema the lateral nail border.  With local warmth.  Does not appear to be just skin staining from phenol. No other open wounds. No skin lesions.  Orthopedic: Normal joint ROM without pain or crepitus bilaterally. No visible deformities. No bony tenderness.   Radiographs: None Assessment:   1. Ingrown toenail    Plan:  Patient was evaluated and treated and all questions answered.  Ingrown Nail,  right -Ingrown nail excised. See procedure note. -Continue antibiotic -Educated on post-procedure care including soaking. Written instructions provided and reviewed. -Patient to continue follow with Dr. Amalia Hailey.  Procedure: Avulsion of toenail Location: Right 1st toe  Anesthesia: Lidocaine 1% plain; 1.5 mL and Marcaine 0.5% plain; 1.5 mL, digital block. Skin Prep: Betadine. Dressing: Silvadene; telfa; dry, sterile, compression dressing. Technique: Following skin prep, an additional slant of nail was cut from the lateral border of the nail and avulsed with a hemostat. The wound bed was explored. No evident nail. The area was curettaged  and cleansed. Sterile dressing applied. Disposition: Patient tolerated procedure well.   Follow with Dr. Amalia Hailey.

## 2018-03-11 ENCOUNTER — Encounter: Payer: Self-pay | Admitting: Family Medicine

## 2018-03-15 DIAGNOSIS — I129 Hypertensive chronic kidney disease with stage 1 through stage 4 chronic kidney disease, or unspecified chronic kidney disease: Secondary | ICD-10-CM

## 2018-03-15 DIAGNOSIS — N182 Chronic kidney disease, stage 2 (mild): Secondary | ICD-10-CM

## 2018-03-15 DIAGNOSIS — E1122 Type 2 diabetes mellitus with diabetic chronic kidney disease: Secondary | ICD-10-CM | POA: Insufficient documentation

## 2018-03-17 ENCOUNTER — Encounter: Payer: Self-pay | Admitting: Podiatry

## 2018-03-17 ENCOUNTER — Ambulatory Visit (INDEPENDENT_AMBULATORY_CARE_PROVIDER_SITE_OTHER): Payer: Medicare Other | Admitting: Podiatry

## 2018-03-17 DIAGNOSIS — L600 Ingrowing nail: Secondary | ICD-10-CM

## 2018-03-17 MED ORDER — GENTAMICIN SULFATE 0.1 % EX CREA: 1.0000 "application " | TOPICAL_CREAM | Freq: Three times a day (TID) | CUTANEOUS | 1 refills | Status: DC

## 2018-03-17 NOTE — Progress Notes (Signed)
   Subjective: Patient presents today 2 weeks post ingrown nail permanent nail avulsion procedure of the lateral border of the right hallux. Patient states that the toe and nail fold is feeling much better. She reports some mild drainage but denies any soreness. She states she cannot take the Doxycycline due to flu like symptoms after use. Patient is here for further evaluation and treatment.   Past Medical History:  Diagnosis Date  . Achilles tendinitis of right lower extremity   . Allergy   . Asthma    Moderate  . Chronic back pain   . Depression   . Gastroparesis    DM  . Hyperlipidemia   . Hypertension   . Hypoxemia   . Irritable bowel syndrome   . Maculopathy   . NASH (nonalcoholic steatohepatitis)   . Nuclear sclerosis of both eyes   . OA (osteoarthritis)    Hips  . OSA (obstructive sleep apnea)   . Post-void dribbling   . Psoriasis   . Regurgitation   . SOB (shortness of breath)   . Tear of left rotator cuff   . TMJ (dislocation of temporomandibular joint)   . Trochanteric bursitis of right hip     Objective: Skin is warm, dry and supple. Nail and respective nail fold appears to be healing appropriately. Open wound to the associated nail fold with a granular wound base and moderate amount of fibrotic tissue. Minimal drainage noted. Mild erythema around the periungual region likely due to phenol chemical matricectomy.  Assessment: #1 postop permanent partial nail avulsion lateral border right hallux #2 open wound periungual nail fold of respective digit.   Plan of care: #1 patient was evaluated  #2 debridement of open wound was performed to the periungual border of the respective toe using a currette. Antibiotic ointment and Band-Aid was applied. #3 Prescription for gentamicin cream provided to patient.  #4 patient is to return to clinic on a PRN basis.   Edrick Kins, DPM [facility]  Dr. Edrick Kins, [address]                                         [place], [place] [address]                Office [phone]  Fax [phone]

## 2018-03-25 ENCOUNTER — Other Ambulatory Visit: Payer: Self-pay | Admitting: Family Medicine

## 2018-03-25 DIAGNOSIS — K580 Irritable bowel syndrome with diarrhea: Secondary | ICD-10-CM

## 2018-04-06 ENCOUNTER — Ambulatory Visit (INDEPENDENT_AMBULATORY_CARE_PROVIDER_SITE_OTHER): Payer: Medicare Other | Admitting: Family Medicine

## 2018-04-06 ENCOUNTER — Encounter: Payer: Self-pay | Admitting: Family Medicine

## 2018-04-06 VITALS — BP 110/62 | HR 87 | Temp 98.6°F | Ht 66.0 in | Wt 182.3 lb

## 2018-04-06 DIAGNOSIS — I10 Essential (primary) hypertension: Secondary | ICD-10-CM

## 2018-04-06 DIAGNOSIS — N183 Chronic kidney disease, stage 3 unspecified: Secondary | ICD-10-CM

## 2018-04-06 DIAGNOSIS — I6522 Occlusion and stenosis of left carotid artery: Secondary | ICD-10-CM

## 2018-04-06 DIAGNOSIS — L4050 Arthropathic psoriasis, unspecified: Secondary | ICD-10-CM

## 2018-04-06 DIAGNOSIS — E785 Hyperlipidemia, unspecified: Secondary | ICD-10-CM | POA: Diagnosis not present

## 2018-04-06 DIAGNOSIS — Z79899 Other long term (current) drug therapy: Secondary | ICD-10-CM

## 2018-04-06 DIAGNOSIS — E1169 Type 2 diabetes mellitus with other specified complication: Secondary | ICD-10-CM

## 2018-04-06 DIAGNOSIS — E1143 Type 2 diabetes mellitus with diabetic autonomic (poly)neuropathy: Secondary | ICD-10-CM

## 2018-04-06 DIAGNOSIS — Z7952 Long term (current) use of systemic steroids: Secondary | ICD-10-CM

## 2018-04-06 DIAGNOSIS — R5383 Other fatigue: Secondary | ICD-10-CM

## 2018-04-06 DIAGNOSIS — M5136 Other intervertebral disc degeneration, lumbar region: Secondary | ICD-10-CM

## 2018-04-06 DIAGNOSIS — E7800 Pure hypercholesterolemia, unspecified: Secondary | ICD-10-CM

## 2018-04-06 DIAGNOSIS — Z1231 Encounter for screening mammogram for malignant neoplasm of breast: Secondary | ICD-10-CM

## 2018-04-06 MED ORDER — SPIRONOLACTONE-HCTZ 25-25 MG PO TABS: 0.5000 | ORAL_TABLET | Freq: Every day | ORAL | 1 refills | Status: DC

## 2018-04-06 MED ORDER — METHOTREXATE 2.5 MG PO TABS: 15.0000 mg | ORAL_TABLET | ORAL | Status: DC

## 2018-04-06 NOTE — Progress Notes (Signed)
Name: Mary Cantrell   MRN: [id number]    DOB: 1945-07-01   Date:04/06/2018       Progress Note  Subjective  Chief Complaint  Chief Complaint  Patient presents with  . Follow-up  . Fatigue    HPI  Polymyalgia rheumatica: Diagnosed in August 2016. Seen Dr. Dossie Der, Rheumatologist in [place].She was off medications for one year, but since July she  is back on prednisone 7.5 mg because of increase in pain on shoulders, hips, legs and hands. Feels stiff all day, dropping things at the end of the day. Prednisone helps a little but feels like she will need to go up on dose now. Right now pain is 3/10  Psoriatic arthritis:" she is under the care of Dr. Dossie Der and is now on methotrexate , off on meloxicam since back on prednisone, she states also has some trigger fingers and hand symptoms is the worse.   HTN: bp is as goal, no chest pain or palpitation. Denies dizziness. Compliant with medication   Asthma Moderate: seen by pulmonologist Dr. Raul Del,, completed pulmonary rehab Summer 2016, she was doing well, however still has episodes of hypoxemia, she states symptoms better on prednisone   Hyperlipidemia: taking medication as prescribed and is doing well, no side effects. Reviewed labs and at goal.   DM II gastroparesis: she is taking Metformin 1500 mg, going to endocrinologist since this Summer and is off glipizide and  is consistently taking metformin. Glucose at home has been at goal fasting 130's. No polyphagia, no polydipsia or polyuria. She has symptoms of gastroparesis, also on ARB  IBS: she was diagnosed in her 42's, she used to take Bentyl, symptoms improved after she retired in 2009, still triggered by salads. No blood or mucus in her stools, stable symptoms. Unchanged   OSA: wearing CPAP   Fatigue: over the past couple of weeks she has been feeling very tired, worse than usual. She is concerned since prednisone is not helping even though the pain has improved. She states had a  GI bug a couple of weeks ago. Resolved now but still very tired. We will check labs as requested.  High risk medication: metformin we will check B12 and also bone density since on prednisone  Patient Active Problem List   Diagnosis Date Noted  . 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 Psoriatic arthritis ([place]) 12/04/2017  . Stenosis of left carotid artery 12/16/2016  . Polymyalgia rheumatica ([place]) 04/13/2015  . Benign essential HTN 12/21/2014  . Back pain, chronic 12/21/2014  . Narrowing of intervertebral disc space 12/21/2014  . Diabetic gastroparesis ([place]) 12/21/2014  . Amaurosis fugax of left eye 12/21/2014  . H/O: hypothyroidism 12/21/2014  . Hyperlipidemia 12/21/2014  . IBS (irritable bowel syndrome) 12/21/2014  . Disorder of macula of retina 12/21/2014  . Asthma, moderate 12/21/2014  . NASH (nonalcoholic steatohepatitis) 12/21/2014  . NS (nuclear sclerosis) 12/21/2014  . Osteoarthrosis 12/21/2014  . Psoriasis 12/21/2014  . Knee torn cartilage 12/21/2014  . Closed dislocation of jaw 12/21/2014  . Diabetes mellitus type 2 in obese ([place]) 12/21/2014  . Obstructive apnea 01/03/2014  . History of shoulder surgery 04/23/2011    Past Surgical History:  Procedure Laterality Date  . ABDOMINAL HYSTERECTOMY    . APPENDECTOMY    . ARTERY BIOPSY N/A 02/23/2015   Procedure: BIOPSY TEMPORAL ARTERY;  Surgeon: Algernon Huxley, MD;  Location: [facility] ORS;  Service: Vascular;  Laterality: N/A;  .  BREAST BIOPSY Left   . EXCISION / CURETTAGE BONE CYST FINGER     Left Index Finger  . PILONIDAL CYST EXCISION    . SHOULDER ARTHROSCOPY Right [id number]   Dr. Mauri Pole  . STOMACH SURGERY  1941   3 arteries leaking in stomach after appendectomy  . TONSILLECTOMY      Family History  Problem Relation Age of Onset  . Rheum arthritis Father   . Diabetes Father   . Pneumonia Father   . Pneumonia Mother   . Diabetes Sister   .  Osteoarthritis Sister     Social History   Socioeconomic History  . Marital status: Married    Spouse name: Not on file  . Number of children: 2  . Years of education: Not on file  . Highest education level: Not on file  Occupational History  . Occupation: retired  Scientific laboratory technician  . Financial resource strain: Not hard at all  . Food insecurity:    Worry: Never true    Inability: Never true  . Transportation needs:    Medical: No    Non-medical: No  Tobacco Use  . Smoking status: Never Smoker  . Smokeless tobacco: Never Used  Substance and Sexual Activity  . Alcohol use: No    Alcohol/week: 0.0 standard drinks  . Drug use: No  . Sexual activity: Yes    Partners: Male    Comment: hysterectomy  Lifestyle  . Physical activity:    Days per week: 7 days    Minutes per session: 30 min  . Stress: Not at all  Relationships  . Social connections:    Talks on phone: More than three times a week    Gets together: More than three times a week    Attends religious service: Never    Active member of club or organization: Yes    Attends meetings of clubs or organizations: More than 4 times per year    Relationship status: Married  . Intimate partner violence:    Fear of current or ex partner: No    Emotionally abused: No    Physically abused: No    Forced sexual activity: No  Other Topics Concern  . Not on file  Social History Narrative  . Not on file     Current Outpatient Medications:  .  aspirin 81 MG tablet, Take 81 mg by mouth every morning. , Disp: , Rfl:  .  Azelastine HCl 0.15 % SOLN, , Disp: , Rfl:  .  Calcium Carbonate-Vit D-Min ([employer] CALCIUM 600 + D/MINERALS) 600-400 MG-UNIT TABS, Take 1 tablet by mouth daily., Disp: , Rfl: 1 .  Cinnamon 500 MG capsule, Take 1 capsule by mouth 2 (two) times daily., Disp: , Rfl:  .  cyanocobalamin 100 MCG tablet, Take 100 mcg by mouth every morning. , Disp: , Rfl:  .  dicyclomine (BENTYL) 20 MG tablet, TAKE 1 TABLET FOUR TIMES A  DAY BEFORE MEALS AND AT BEDTIME, Disp: 100 tablet, Rfl: 14 .  fluticasone (FLONASE) 50 MCG/ACT nasal spray, USE 2 SPRAYS IN EACH NOSTRIL AT BEDTIME, Disp: 48 g, Rfl: 1 .  folic acid (FOLVITE) 1 MG tablet, , Disp: , Rfl:  .  gabapentin (NEURONTIN) 100 MG capsule, Take 2 capsules (200 mg total) by mouth at bedtime., Disp: 180 capsule, Rfl: 4 .  gentamicin cream (GARAMYCIN) 0.1 %, Apply 1 application topically 3 (three) times daily., Disp: 30 g, Rfl: 1 .  metFORMIN (GLUCOPHAGE-XR) 500 MG 24 hr tablet, Take  3 tablets (1,500 mg total) by mouth daily., Disp: 270 tablet, Rfl: 1 .  methotrexate (RHEUMATREX) 2.5 MG tablet, Take 6 tablets (15 mg total) by mouth once a week. Caution:Chemotherapy. Protect from light., Disp: , Rfl:  .  omeprazole (PRILOSEC) 40 MG capsule, Take 40 mg by mouth daily., Disp: , Rfl:  .  pravastatin (PRAVACHOL) 40 MG tablet, Take 1 tablet (40 mg total) by mouth daily., Disp: 90 tablet, Rfl: 3 .  predniSONE (DELTASONE) 2.5 MG tablet, Take 7.5 mg by mouth daily., Disp: , Rfl:  .  spironolactone-hydrochlorothiazide (ALDACTAZIDE) 25-25 MG tablet, Take 0.5 tablets by mouth daily., Disp: 45 tablet, Rfl: 1 .  vitamin C (ASCORBIC ACID) 500 MG tablet, Take 1 tablet by mouth at bedtime. , Disp: , Rfl:  .  albuterol (VENTOLIN HFA) 108 (90 BASE) MCG/ACT inhaler, Inhale 2 puffs into the lungs every 4 (four) hours as needed for wheezing (takes every am and then as needed.). , Disp: , Rfl:  .  budesonide-formoterol (SYMBICORT) 160-4.5 MCG/ACT inhaler, Inhale 2 puffs into the lungs every morning., Disp: , Rfl:  .  glucose blood (ONE TOUCH ULTRA TEST) test strip, 1 each by Other route 2 (two) times daily. DX: E11.69, E11.43, Disp: 100 each, Rfl: 2 .  montelukast (SINGULAIR) 10 MG tablet, Take by mouth., Disp: , Rfl:  .  olmesartan (BENICAR) 20 MG tablet, Take 1 tablet (20 mg total) by mouth daily. (Patient not taking: Reported on 02/17/2018), Disp: 90 tablet, Rfl: 1 .  ONE TOUCH ULTRA TEST test strip,  USE 1 STRIP 2 (TWO) TIMES DAILY. E11.69, E11.43, Disp: 100 each, Rfl: 2 .  tiZANidine (ZANAFLEX) 4 MG capsule, Take 4 mg by mouth 3 (three) times daily as needed for muscle spasms., Disp: , Rfl:   Allergies  Allergen Reactions  . Doxycycline Other (See Comments)    Weakness, flu like symptoms  . Codeine     Other reaction(s): Other (See Comments) Dizzy  . Lantus  [Insulin Glargine]     made skin hot  . Metformin     Coated metformin is tolerated well.  Marland Kitchen Penicillin G     Other reaction(s): Unknown  . Sitagliptin   . Sulfa Antibiotics     Other reaction(s): Other (See Comments) Red burning skin    I personally reviewed active problem list, medication list, allergies, family history, social history with the patient/caregiver today.   ROS  Constitutional: Negative for fever or weight change.  Respiratory: Negative for cough and shortness of breath.   Cardiovascular: Negative for chest pain or palpitations.  Gastrointestinal: Negative for abdominal pain, no bowel changes.  Musculoskeletal: Postiive  for gait problem and  joint swelling.  Skin: Negative for rash.  Neurological: Negative for dizziness or headache.  No other specific complaints in a complete review of systems (except as listed in HPI above).  Objective  Vitals:   04/06/18 1400  BP: 110/62  Pulse: 87  Temp: 98.6 F (37 C)  TempSrc: Oral  SpO2: 97%  Weight: 182 lb 4.8 oz (82.7 kg)  Height: 5' 6"  (1.676 m)     Body mass index is 29.42 kg/m.  Physical Exam  Constitutional: Patient appears well-developed and well-nourished. Overweight. No distress.  HEENT: head atraumatic, normocephalic, pupils equal and reactive to light, eneck supple, throat within normal limits Cardiovascular: Normal rate, regular rhythm and normal heart sounds.  No murmur heard. No BLE edema. Pulmonary/Chest: Effort normal and breath sounds normal. No respiratory distress. Abdominal: Soft.  There is  no tenderness. Psychiatric:  Patient has a normal mood and affect. behavior is normal. Judgment and thought content normal. Muscular skeletal: right hand wearing a glove to help with trigger fingers   PHQ2/9: Depression screen [facility] 2/9 04/06/2018 12/04/2017 01/07/2017 06/26/2016 03/13/2016  Decreased Interest 0 1 0 2 0  Down, Depressed, Hopeless 0 0 0 0 0  PHQ - 2 Score 0 1 0 2 0  Altered sleeping 0 0 - 0 -  Tired, decreased energy 0 1 - 3 -  Change in appetite 0 1 - 3 -  Feeling bad or failure about yourself  0 0 - 0 -  Trouble concentrating 0 0 - 0 -  Moving slowly or fidgety/restless 0 0 - 0 -  Suicidal thoughts 0 0 - 0 -  PHQ-9 Score 0 3 - 8 -  Difficult doing work/chores Not difficult at all Not difficult at all - Somewhat difficult -    Fall Risk: Fall Risk  04/06/2018 12/04/2017 01/07/2017 06/26/2016 03/13/2016  Falls in the past year? No Yes Yes No Yes  Comment - - - - -  Number falls in past yr: - 1 1 - 1  Injury with Fall? - Yes Yes - Yes  Comment - - - - Hurt her left elbow  Risk for fall due to : - - - - -  Follow up - Falls prevention discussed - - -     Functional Status Survey: Is the patient deaf or have difficulty hearing?: Yes Does the patient have difficulty seeing, even when wearing glasses/contacts?: Yes Does the patient have difficulty concentrating, remembering, or making decisions?: No Does the patient have difficulty walking or climbing stairs?: No Does the patient have difficulty dressing or bathing?: No Does the patient have difficulty doing errands alone such as visiting a doctor's office or shopping?: No    Assessment & Plan  1. Dyslipidemia associated with type 2 diabetes mellitus ([place])  Seeing Endo now   2. Hypertension, benign  - spironolactone-hydrochlorothiazide (ALDACTAZIDE) 25-25 MG tablet; Take 0.5 tablets by mouth daily.  Dispense: 45 tablet; Refill: 1  3. Diabetes mellitus with gastroparesis ([place])  Stable seeing Endo   4. Benign essential HTN  - CBC with  Differential/Platelet - COMPLETE METABOLIC PANEL WITH GFR - TSH  5. Psoriatic arthritis ([place])  Under the care of Rheumatologist   6. DDD (degenerative disc disease), lumbar  7. Pure hypercholesterolemia  Continue medication   8. Other fatigue  - VITAMIN D 25 Hydroxy (Vit-D Deficiency, Fractures) - Vitamin B12 - TSH  9. Long-term use of high-risk medication  - Vitamin B12  10. CKD (chronic kidney disease), stage III (HCC)  - VITAMIN D 25 Hydroxy (Vit-D Deficiency, Fractures)  11. On prednisone therapy  - DG Bone Density; Future  12. Encounter for screening mammogram for breast cancer  - MM 3D SCREEN BREAST BILATERAL; Future

## 2018-04-07 LAB — CBC WITH DIFFERENTIAL/PLATELET
Basophils Absolute: 59 cells/uL (ref 0–200)
Basophils Relative: 0.6 %
EOS PCT: 1.4 %
Eosinophils Absolute: 139 cells/uL (ref 15–500)
HCT: 37.4 % (ref 35.0–45.0)
Hemoglobin: 13.2 g/dL (ref 11.7–15.5)
Lymphs Abs: 2346 cells/uL (ref 850–3900)
MCH: 32.2 pg (ref 27.0–33.0)
MCHC: 35.3 g/dL (ref 32.0–36.0)
MCV: 91.2 fL (ref 80.0–100.0)
MONOS PCT: 7.9 %
MPV: 9.4 fL (ref 7.5–12.5)
NEUTROS PCT: 66.4 %
Neutro Abs: 6574 cells/uL (ref 1500–7800)
PLATELETS: 330 10*3/uL (ref 140–400)
RBC: 4.1 10*6/uL (ref 3.80–5.10)
RDW: 13.8 % (ref 11.0–15.0)
TOTAL LYMPHOCYTE: 23.7 %
WBC mixed population: 782 cells/uL (ref 200–950)
WBC: 9.9 10*3/uL (ref 3.8–10.8)

## 2018-04-07 LAB — COMPLETE METABOLIC PANEL WITH GFR
AG RATIO: 2 (calc) (ref 1.0–2.5)
ALT: 37 U/L — AB (ref 6–29)
AST: 21 U/L (ref 10–35)
Albumin: 4.4 g/dL (ref 3.6–5.1)
Alkaline phosphatase (APISO): 78 U/L (ref 33–130)
BUN/Creatinine Ratio: 22 (calc) (ref 6–22)
BUN: 22 mg/dL (ref 7–25)
CO2: 30 mmol/L (ref 20–32)
CREATININE: 0.98 mg/dL — AB (ref 0.60–0.93)
Calcium: 9.7 mg/dL (ref 8.6–10.4)
Chloride: 100 mmol/L (ref 98–110)
GFR, EST NON AFRICAN AMERICAN: 57 mL/min/{1.73_m2} — AB (ref >=60)
GFR, Est African American: 66 mL/min/{1.73_m2} (ref >=60)
Globulin: 2.2 g/dL (calc) (ref 1.9–3.7)
Glucose, Bld: 120 mg/dL (ref 65–139)
POTASSIUM: 3.5 mmol/L (ref 3.5–5.3)
Sodium: 139 mmol/L (ref 135–146)
Total Bilirubin: 0.4 mg/dL (ref 0.2–1.2)
Total Protein: 6.6 g/dL (ref 6.1–8.1)

## 2018-04-07 LAB — VITAMIN B12: Vitamin B-12: >2000 pg/mL — ABNORMAL HIGH (ref 200–1100)

## 2018-04-07 LAB — VITAMIN D 25 HYDROXY (VIT D DEFICIENCY, FRACTURES): VIT D 25 HYDROXY: 39 ng/mL (ref 30–100)

## 2018-04-07 LAB — TSH: TSH: 1.07 mIU/L (ref 0.40–4.50)

## 2018-04-08 LAB — HEMOGLOBIN A1C: HEMOGLOBIN A1C: 7.1

## 2018-04-09 ENCOUNTER — Telehealth: Payer: Self-pay | Admitting: Family Medicine

## 2018-04-09 ENCOUNTER — Ambulatory Visit: Payer: Self-pay

## 2018-04-09 NOTE — Telephone Encounter (Signed)
Pt called to get results of recent B12 but results not released. Pt states she is feeling extremely weak after the past several day and was seen Monday at the office By Dr Ancil Boozer. Pt reported having an episode of extreme weakness while out shopping today. She states that she had to sit a long time in the parking lot until she finally recovered enough to drive home. She said she got real hot feeling as well but states she gets that because she is on prednisone. Pt states she had these symptoms one other time after being DX with Lyme Disease.  Pt says she has had many tick bites this year but no bulls eye mark.  Per protocol appointment made for tomorrow with another provider in office. Care advice given to patient. Pt verbalized understanding. Note to Dr Ancil Boozer to release B12 will be routed to practice.  Reason for Disposition . [1] MODERATE weakness (i.e., interferes with work, school, normal activities) AND [2] persists > 3 days  Answer Assessment - Initial Assessment Questions 1. DESCRIPTION: "Describe how you are feeling."     Very weak  2. SEVERITY: "How bad is it?"  "Can you stand and walk?"   - MILD - Feels weak or tired, but does not interfere with work, school or normal activities   - [place] to stand and walk; weakness interferes with work, school, or normal activities   - SEVERE - Unable to stand or walk     moderate 3. ONSET:  "When did the weakness begin?"     [place] on Monday  And endocrinology 4. CAUSE: "What do you think is causing the weakness?"     Unsure maybe tick bites 5. MEDICINES: "Have you recently started a new medicine or had a change in the amount of a medicine?"     no 6. OTHER SYMPTOMS: "Do you have any other symptoms?" (e.g., chest pain, fever, cough, SOB, vomiting, diarrhea, bleeding, other areas of pain)     Sweating but taking prednison 7. PREGNANCY: "Is there any chance you are pregnant?" "When was your last menstrual period?"     N/A  Protocols used:  WEAKNESS (GENERALIZED) AND FATIGUE-A-AH

## 2018-04-09 NOTE — Telephone Encounter (Signed)
Copied from [place] [id number]. Topic: Quick Communication - See Telephone Encounter >> Apr 09, 2018  3:05 PM Bea Graff, NT wrote: CRM for notification. See Telephone encounter for: 04/09/18. Pt would like a call to review her lab results.

## 2018-04-10 ENCOUNTER — Ambulatory Visit (INDEPENDENT_AMBULATORY_CARE_PROVIDER_SITE_OTHER): Payer: Medicare Other | Admitting: Family Medicine

## 2018-04-10 ENCOUNTER — Encounter: Payer: Self-pay | Admitting: Family Medicine

## 2018-04-10 ENCOUNTER — Other Ambulatory Visit
Admission: RE | Admit: 2018-04-10 | Discharge: 2018-04-10 | Disposition: A | Discharge status: Home / Self Care | Payer: Medicare Other | Source: Ambulatory Visit | Attending: Family Medicine | Admitting: Family Medicine

## 2018-04-10 VITALS — BP 118/70 | HR 91 | Temp 98.0°F | Ht 66.0 in | Wt 180.6 lb

## 2018-04-10 DIAGNOSIS — E1122 Type 2 diabetes mellitus with diabetic chronic kidney disease: Secondary | ICD-10-CM | POA: Diagnosis not present

## 2018-04-10 DIAGNOSIS — R531 Weakness: Secondary | ICD-10-CM

## 2018-04-10 DIAGNOSIS — K7581 Nonalcoholic steatohepatitis (NASH): Secondary | ICD-10-CM

## 2018-04-10 DIAGNOSIS — G4733 Obstructive sleep apnea (adult) (pediatric): Secondary | ICD-10-CM

## 2018-04-10 DIAGNOSIS — N182 Chronic kidney disease, stage 2 (mild): Secondary | ICD-10-CM

## 2018-04-10 DIAGNOSIS — I129 Hypertensive chronic kidney disease with stage 1 through stage 4 chronic kidney disease, or unspecified chronic kidney disease: Secondary | ICD-10-CM

## 2018-04-10 DIAGNOSIS — L4050 Arthropathic psoriasis, unspecified: Secondary | ICD-10-CM

## 2018-04-10 DIAGNOSIS — E1169 Type 2 diabetes mellitus with other specified complication: Secondary | ICD-10-CM

## 2018-04-10 DIAGNOSIS — E669 Obesity, unspecified: Secondary | ICD-10-CM

## 2018-04-10 LAB — CKMB (ARMC ONLY): CK, MB: 1.4 ng/mL (ref 0.5–5.0)

## 2018-04-10 LAB — TROPONIN I: Troponin I: <0.03 ng/mL (ref <0.03)

## 2018-04-10 LAB — CK: Total CK: 42 U/L (ref 38–234)

## 2018-04-10 NOTE — Telephone Encounter (Signed)
Please review labs and advise.

## 2018-04-10 NOTE — Patient Instructions (Signed)
Please go to the [facility] for further evaluation

## 2018-04-10 NOTE — Progress Notes (Signed)
Name: Mary Cantrell   MRN: [id number]    DOB: [birth year]   Date:04/10/2018       Progress Note  Subjective  Chief Complaint  Chief Complaint  Patient presents with  . Fatigue    onset 3 weeks ago, intermittent.    HPI  Pt presents with 3 week history of episodic weakness.  She saw Dr. Ancil Boozer on Monday and had laboratory testing performed at this time. While out shopping yesterday she had an episode of fatigue/weakness that washed over her and was accompanied by profuse sweating afterwards for a few minutes. Denies fevers or chills, chest pain, shortness of breath, or GI upset.  OSA: She uses CPAP w/ O2 and uses this nightly. DM: 4 days ago BG was 120. She was put back on Glipizide 14m yesterday by the endocrinologist.  YWilburn Mylarshe checked her BG after feeling weak and it was 150. Psoriatic Arthritis: Taking Methotrexate (taking as prescribed) and prednisone (restarted July 2019 after being off for a year) Recent toe infection - husband thinks that her episodes started when she started doxycyline after having surgery on her foot.  Though she is no longer on the doxycyline and still having episodes.  She saw Dr. SAncil BoozerPCP on 04/06/2018 -  - B12 is too high >2000 - CBC, CMP, Vitamin D, TSH are all WNL/Non-contributory to current symptoms.  Patient Active Problem List   Diagnosis Date Noted  . 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 Psoriatic arthritis ([place] 12/04/2017  . Stenosis of left carotid artery 12/16/2016  . Polymyalgia rheumatica ([place] 04/13/2015  . Benign essential HTN 12/21/2014  . Back pain, chronic 12/21/2014  . Narrowing of intervertebral disc space 12/21/2014  . Diabetic gastroparesis ([place] 12/21/2014  . Amaurosis fugax of left eye 12/21/2014  . H/O: hypothyroidism 12/21/2014  . Hyperlipidemia 12/21/2014  . IBS (irritable bowel syndrome) 12/21/2014  . Disorder of macula of retina 12/21/2014   . Asthma, moderate 12/21/2014  . NASH (nonalcoholic steatohepatitis) 12/21/2014  . NS (nuclear sclerosis) 12/21/2014  . Osteoarthrosis 12/21/2014  . Psoriasis 12/21/2014  . Knee torn cartilage 12/21/2014  . Closed dislocation of jaw 12/21/2014  . Diabetes mellitus type 2 in obese ([place] 12/21/2014  . Obstructive apnea 01/03/2014  . History of shoulder surgery 04/23/2011    Social History   Tobacco Use  . Smoking status: Never Smoker  . Smokeless tobacco: Never Used  Substance Use Topics  . Alcohol use: No    Alcohol/week: 0.0 standard drinks     Current Outpatient Medications:  .  albuterol (VENTOLIN HFA) 108 (90 BASE) MCG/ACT inhaler, Inhale 2 puffs into the lungs every 4 (four) hours as needed for wheezing (takes every am and then as needed.). , Disp: , Rfl:  .  aspirin 81 MG tablet, Take 81 mg by mouth every morning. , Disp: , Rfl:  .  Azelastine HCl 0.15 % SOLN, , Disp: , Rfl:  .  budesonide-formoterol (SYMBICORT) 160-4.5 MCG/ACT inhaler, Inhale 2 puffs into the lungs every morning., Disp: , Rfl:  .  Calcium Carbonate-Vit D-Min ([employer] CALCIUM 600 + D/MINERALS) 600-400 MG-UNIT TABS, Take 1 tablet by mouth daily., Disp: , Rfl: 1 .  Cinnamon 500 MG capsule, Take 1 capsule by mouth 2 (two) times daily., Disp: , Rfl:  .  cyanocobalamin 100 MCG tablet, Take 100 mcg by mouth every morning. , Disp: , Rfl:  .  dicyclomine (BENTYL) 20 MG tablet, TAKE 1  TABLET FOUR TIMES A DAY BEFORE MEALS AND AT BEDTIME, Disp: 100 tablet, Rfl: 14 .  fluticasone (FLONASE) 50 MCG/ACT nasal spray, USE 2 SPRAYS IN EACH NOSTRIL AT BEDTIME, Disp: 48 g, Rfl: 1 .  folic acid (FOLVITE) 1 MG tablet, , Disp: , Rfl:  .  gabapentin (NEURONTIN) 100 MG capsule, Take 2 capsules (200 mg total) by mouth at bedtime., Disp: 180 capsule, Rfl: 4 .  gentamicin cream (GARAMYCIN) 0.1 %, Apply 1 application topically 3 (three) times daily., Disp: 30 g, Rfl: 1 .  glucose blood (ONE TOUCH ULTRA TEST) test strip, 1 each by Other  route 2 (two) times daily. DX: E11.69, E11.43, Disp: 100 each, Rfl: 2 .  metFORMIN (GLUCOPHAGE-XR) 500 MG 24 hr tablet, Take 3 tablets (1,500 mg total) by mouth daily., Disp: 270 tablet, Rfl: 1 .  methotrexate (RHEUMATREX) 2.5 MG tablet, Take 6 tablets (15 mg total) by mouth once a week. Caution:Chemotherapy. Protect from light., Disp: , Rfl:  .  montelukast (SINGULAIR) 10 MG tablet, Take by mouth., Disp: , Rfl:  .  olmesartan (BENICAR) 20 MG tablet, Take 1 tablet (20 mg total) by mouth daily. (Patient not taking: Reported on 02/17/2018), Disp: 90 tablet, Rfl: 1 .  omeprazole (PRILOSEC) 40 MG capsule, Take 40 mg by mouth daily., Disp: , Rfl:  .  ONE TOUCH ULTRA TEST test strip, USE 1 STRIP 2 (TWO) TIMES DAILY. E11.69, E11.43, Disp: 100 each, Rfl: 2 .  pravastatin (PRAVACHOL) 40 MG tablet, Take 1 tablet (40 mg total) by mouth daily., Disp: 90 tablet, Rfl: 3 .  predniSONE (DELTASONE) 2.5 MG tablet, Take 7.5 mg by mouth daily., Disp: , Rfl:  .  spironolactone-hydrochlorothiazide (ALDACTAZIDE) 25-25 MG tablet, Take 0.5 tablets by mouth daily., Disp: 45 tablet, Rfl: 1 .  tiZANidine (ZANAFLEX) 4 MG capsule, Take 4 mg by mouth 3 (three) times daily as needed for muscle spasms., Disp: , Rfl:  .  vitamin C (ASCORBIC ACID) 500 MG tablet, Take 1 tablet by mouth at bedtime. , Disp: , Rfl:   Allergies  Allergen Reactions  . Doxycycline Other (See Comments)    Weakness, flu like symptoms  . Codeine     Other reaction(s): Other (See Comments) Dizzy  . Lantus  [Insulin Glargine]     made skin hot  . Metformin     Coated metformin is tolerated well.  Marland Kitchen Penicillin G     Other reaction(s): Unknown  . Sitagliptin   . Sulfa Antibiotics     Other reaction(s): Other (See Comments) Red burning skin    I personally reviewed active problem list, medication list, allergies, family history, notes from last encounter, lab results with the patient/caregiver today.  ROS  Ten systems reviewed and is negative  except as mentioned in HPI  Objective  Vitals:   04/10/18 0946  BP: 118/70  Pulse: 91  Temp: 98 F (36.7 C)  SpO2: 96%  Weight: 180 lb 9.6 oz (81.9 kg)  Height: 5' 6"  (1.676 m)   Body mass index is 29.15 kg/m.  Nursing Note and Vital Signs reviewed.  Physical Exam  Constitutional: Patient appears well-developed and well-nourished. No distress.  HENT: Head: Normocephalic and atraumatic.  Mouth/Throat: Oropharynx is clear and moist. No oropharyngeal exudate or tonsillar swelling.  Eyes: Conjunctivae and EOM are normal. No scleral icterus.  Pupils are equal, round, and reactive to light.  Neck: Normal range of motion. Neck supple. No JVD present. No thyromegaly present.  Cardiovascular: Normal rate, regular rhythm and normal  heart sounds.  No murmur heard. No BLE edema. Pulmonary/Chest: Effort normal and breath sounds normal. No respiratory distress. Musculoskeletal: Normal range of motion, no joint effusions. No gross deformities Neurological: Pt is alert and oriented to person, place, and time. No cranial nerve deficit. Coordination, balance, strength, speech and gait are normal.  Skin: Skin is warm and dry. No rash noted. No erythema.  Psychiatric: Patient has slightly labile/inappropriate affect - she smiles/laughs occasionally at inappropriate parts of the conversation. Judgment and thought content normal.   No results found for this or any previous visit (from the past 72 hour(s)).  Assessment & Plan  1. Generalized weakness - EKG 12-Lead - NSR - Troponin I - CK Total (and CKMB) - From a neurologic standpoint, the patient appears at her baseline. I did discuss her inappropriate smile/laugh with her PCP Dr. Ancil Boozer who states this is baseline for the patient.  This may have been more pronounced today as she is anxious about these episodes of weakness.    2. Hypertension associated with stage 2 chronic kidney disease due to type 2 diabetes mellitus ([place]) 3. Obstructive  apnea 4. NASH (nonalcoholic steatohepatitis) 5. Diabetes mellitus type 2 in obese ([place]) 6. Psoriatic arthritis ([place]) - She does have complicated medical history, and several medications that could be contributing to a feeling of weakness. Her BP and HR and EKG appear WNL today.  Her home BG yesterday was 150 after the episode, and her lab draw showed BG at 120.  I provided reassurance that her labs with Dr. Ancil Boozer were non-contributory and reassuring. - I did advise she decrease her B12 to twice weekly at the most. - Consulted with PCP Dr. Ancil Boozer regarding this patient's concerns, and she is in agreement with the plan of care.  -Red flags and when to present for emergency care or RTC including fever >101.72F, chest pain, shortness of breath, new/worsening/un-resolving symptoms, syncope/near syncope reviewed with patient at time of visit. Follow up and care instructions discussed and provided in AVS.

## 2018-05-08 ENCOUNTER — Telehealth: Payer: Self-pay | Admitting: Family Medicine

## 2018-05-08 NOTE — Telephone Encounter (Signed)
Copied from [place] [id number]. Topic: General - Other >> May 08, 2018  3:34 PM Leward Quan A wrote: Reason for CRM: Patient called to say that pharmacy is requesting a new Rx for ONE TOUCH ULTRA TEST test strip, she stated that pharmacy informed her that Medicare will not pay for testing twice a day but will pay for Rx that say testing once a day.

## 2018-05-10 MED ORDER — GLUCOSE BLOOD VI STRP: 1.0000 | ORAL_STRIP | Freq: Every day | 2 refills | Status: DC

## 2018-05-11 ENCOUNTER — Other Ambulatory Visit: Payer: Self-pay | Admitting: Family Medicine

## 2018-05-11 DIAGNOSIS — E1169 Type 2 diabetes mellitus with other specified complication: Secondary | ICD-10-CM

## 2018-05-11 DIAGNOSIS — E1143 Type 2 diabetes mellitus with diabetic autonomic (poly)neuropathy: Secondary | ICD-10-CM

## 2018-05-11 DIAGNOSIS — E669 Obesity, unspecified: Principal | ICD-10-CM

## 2018-05-19 ENCOUNTER — Ambulatory Visit
Admission: RE | Admit: 2018-05-19 | Discharge: 2018-05-19 | Disposition: A | Discharge status: Home / Self Care | Payer: Medicare Other | Source: Ambulatory Visit | Attending: Family Medicine | Admitting: Family Medicine

## 2018-05-19 DIAGNOSIS — Z1231 Encounter for screening mammogram for malignant neoplasm of breast: Secondary | ICD-10-CM | POA: Diagnosis present

## 2018-05-19 DIAGNOSIS — Z7952 Long term (current) use of systemic steroids: Secondary | ICD-10-CM | POA: Insufficient documentation

## 2018-07-17 ENCOUNTER — Ambulatory Visit (INDEPENDENT_AMBULATORY_CARE_PROVIDER_SITE_OTHER): Payer: Medicare Other | Admitting: Family Medicine

## 2018-07-17 ENCOUNTER — Encounter: Payer: Self-pay | Admitting: Family Medicine

## 2018-07-17 VITALS — BP 122/80 | HR 81 | Temp 99.3°F | Resp 12 | Ht 66.0 in | Wt 185.9 lb

## 2018-07-17 DIAGNOSIS — E1169 Type 2 diabetes mellitus with other specified complication: Secondary | ICD-10-CM

## 2018-07-17 DIAGNOSIS — E669 Obesity, unspecified: Secondary | ICD-10-CM | POA: Insufficient documentation

## 2018-07-17 DIAGNOSIS — J029 Acute pharyngitis, unspecified: Secondary | ICD-10-CM | POA: Diagnosis not present

## 2018-07-17 DIAGNOSIS — M353 Polymyalgia rheumatica: Secondary | ICD-10-CM

## 2018-07-17 DIAGNOSIS — H1033 Unspecified acute conjunctivitis, bilateral: Secondary | ICD-10-CM

## 2018-07-17 LAB — POCT RAPID STREP A (OFFICE): RAPID STREP A SCREEN: NEGATIVE

## 2018-07-17 MED ORDER — NYSTATIN 100000 UNIT/ML MT SUSP: 5.0000 mL | Freq: Four times a day (QID) | OROMUCOSAL | 0 refills | Status: DC

## 2018-07-17 MED ORDER — NEOMYCIN-POLYMYXIN-HC 3.5-10000-1 OP SUSP: 2.0000 [drp] | Freq: Four times a day (QID) | OPHTHALMIC | 0 refills | Status: DC

## 2018-07-17 NOTE — Assessment & Plan Note (Signed)
On prednisone therapy; this plus her symbicort may be causing thrush

## 2018-07-17 NOTE — Progress Notes (Signed)
BP 122/80   Pulse 81   Temp 99.3 F (37.4 C) (Oral)   Resp 12   Ht 5' 6"  (1.676 m)   Wt 185 lb 14.4 oz (84.3 kg)   SpO2 98%   BMI 30.01 kg/m    Subjective:    Patient ID: Mary Cantrell, female    DOB: [birth year], 73 y.o.   MRN: [id number]  HPI: Mary Cantrell is a 73 y.o. female  Chief Complaint  Patient presents with  . URI    onset 2 weeks, syptoms include: sore throat, runny nose and dry cough    HPI  She is here as a new patient to me She has had sore throat for 2 weeks; throat gets better in the morning; she takes her prednisone at night; she takes it at night and is able to sleep; taking prednisone for PMR and RA; she sees Dr. Titus Mould rheumatologist Sore throat x 2 weeks; hurting to swallow; mostly on the right side, can feel in the neck; under the tongue there is discomfort; runny nose and dry cough No fevers; 96 degrees last night Right eye is bloodshot Body aches not worse than usual Stressful events lately Type 2 diabetes; managed by endocrinologist Obesity; was traveling a lot, usually walks more, but couldn't COPD; sees Dr. Raul Del for that; breathing has been "fine"  Depression screen [facility] 2/9 07/17/2018 04/10/2018 04/06/2018 12/04/2017 01/07/2017  Decreased Interest 0 0 0 1 0  Down, Depressed, Hopeless 0 0 0 0 0  PHQ - 2 Score 0 0 0 1 0  Altered sleeping 0 0 0 0 -  Tired, decreased energy 0 0 0 1 -  Change in appetite 0 0 0 1 -  Feeling bad or failure about yourself  0 0 0 0 -  Trouble concentrating 0 0 0 0 -  Moving slowly or fidgety/restless 0 0 0 0 -  Suicidal thoughts 0 - 0 0 -  PHQ-9 Score 0 0 0 3 -  Difficult doing work/chores Not difficult at all Not difficult at all Not difficult at all Not difficult at all -   Fall Risk  07/17/2018 04/10/2018 04/06/2018 12/04/2017 01/07/2017  Falls in the past year? 0 No No Yes Yes  Comment - - - - -  Number falls in past yr: 0 - - 1 1  Injury with Fall? 0 - - Yes Yes  Comment - - - - -  Risk for fall due to : - - -  - -  Follow up - - - Falls prevention discussed -    Relevant past medical, surgical, family and social history reviewed Past Medical History:  Diagnosis Date  . Achilles tendinitis of right lower extremity   . Allergy   . Asthma    Moderate  . Chronic back pain   . Depression   . Gastroparesis    DM  . Hyperlipidemia   . Hypertension   . Hypoxemia   . Irritable bowel syndrome   . Maculopathy   . NASH (nonalcoholic steatohepatitis)   . Nuclear sclerosis of both eyes   . OA (osteoarthritis)    Hips  . OSA (obstructive sleep apnea)   . Post-void dribbling   . Psoriasis   . Regurgitation   . SOB (shortness of breath)   . Tear of left rotator cuff   . TMJ (dislocation of temporomandibular joint)   . Trochanteric bursitis of right hip    Past Surgical History:  Procedure Laterality Date  .  ABDOMINAL HYSTERECTOMY    . APPENDECTOMY    . ARTERY BIOPSY N/A 02/23/2015   Procedure: BIOPSY TEMPORAL ARTERY;  Surgeon: Algernon Huxley, MD;  Location: [facility] ORS;  Service: Vascular;  Laterality: N/A;  . BREAST BIOPSY Left   . EXCISION / CURETTAGE BONE CYST FINGER     Left Index Finger  . PILONIDAL CYST EXCISION    . SHOULDER ARTHROSCOPY Right [id number]   Dr. Mauri Pole  . STOMACH SURGERY  1941   3 arteries leaking in stomach after appendectomy  . TONSILLECTOMY     Family History  Problem Relation Age of Onset  . Rheum arthritis Father   . Diabetes Father   . Pneumonia Father   . Pneumonia Mother   . Diabetes Sister   . Osteoarthritis Sister    Social History   Tobacco Use  . Smoking status: Never Smoker  . Smokeless tobacco: Never Used  Substance Use Topics  . Alcohol use: No    Alcohol/week: 0.0 standard drinks  . Drug use: No     Office Visit from 07/17/2018 in [facility]  AUDIT-C Score  0      Interim medical history since last visit reviewed. Allergies and medications reviewed  Review of Systems Per HPI unless specifically indicated above       Objective:    BP 122/80   Pulse 81   Temp 99.3 F (37.4 C) (Oral)   Resp 12   Ht 5' 6"  (1.676 m)   Wt 185 lb 14.4 oz (84.3 kg)   SpO2 98%   BMI 30.01 kg/m   Wt Readings from Last 3 Encounters:  07/17/18 185 lb 14.4 oz (84.3 kg)  04/10/18 180 lb 9.6 oz (81.9 kg)  04/06/18 182 lb 4.8 oz (82.7 kg)    Physical Exam Constitutional:      Appearance: She is well-developed.  HENT:     Head:     Salivary Glands: Right salivary gland is not diffusely enlarged or tender. Left salivary gland is not diffusely enlarged or tender.     Nose: Rhinorrhea (clear) present.     Mouth/Throat:     Mouth: Mucous membranes are moist.     Pharynx: No oropharyngeal exudate or posterior oropharyngeal erythema.  Eyes:     General: No scleral icterus.    Conjunctiva/sclera:     Right eye: Right conjunctiva is injected.     Left eye: Left conjunctiva is injected.  Cardiovascular:     Rate and Rhythm: Normal rate and regular rhythm.  Pulmonary:     Effort: Pulmonary effort is normal.     Breath sounds: Normal breath sounds.  Lymphadenopathy:     Cervical: Cervical adenopathy (shoddy bilateral) present.  Psychiatric:        Behavior: Behavior normal.    Diabetic Foot Form - Detailed   Diabetic Foot Exam - detailed Diabetic Foot exam was performed with the following findings:  Yes 07/17/2018  9:48 AM  Visual Foot Exam completed.:  Yes  Pulse Foot Exam completed.:  Yes  Right Dorsalis Pedis:  Present Left Dorsalis Pedis:  Present  Sensory Foot Exam Completed.:  Yes Semmes-Weinstein Monofilament Test R Site 1-Great Toe:  Pos L Site 1-Great Toe:  Pos         Results for orders placed or performed in visit on 07/17/18  POCT rapid strep A  Result Value Ref Range   Rapid Strep A Screen Negative Negative      Assessment & Plan:  Problem List Items Addressed This Visit      Endocrine   Diabetes mellitus type 2 in obese [facility])    Foot exam by MD today; managed by endo        Other    Polymyalgia rheumatica ([place])    On prednisone therapy; this plus her symbicort may be causing thrush      Obesity (BMI 30.0-34.9)    Encouraged weight loss; see AVS       Other Visit Diagnoses    Sore throat    -  Primary   Relevant Orders   Culture, Group A Strep   POCT rapid strep A (Completed)   Acute conjunctivitis of both eyes, unspecified acute conjunctivitis type       drops       Follow up plan: No follow-ups on file.  An after-visit summary was printed and given to the patient at [facility].  Please see the patient instructions which may contain other information and recommendations beyond what is mentioned above in the assessment and plan.  Meds ordered this encounter  Medications  . nystatin (MYCOSTATIN) 100000 UNIT/ML suspension    Sig: Take 5 mLs (500,000 Units total) by mouth 4 (four) times daily. Rinse around mouth, and then swallow; 7-10 days until symptoms are gone    Dispense:  473 mL    Refill:  0  . neomycin-polymyxin-hydrocortisone (CORTISPORIN) 3.5-10000-1 ophthalmic suspension    Sig: Place 2 drops into both eyes 4 (four) times daily.    Dispense:  7.5 mL    Refill:  0    Orders Placed This Encounter  Procedures  . Culture, Group A Strep  . POCT rapid strep A

## 2018-07-17 NOTE — Assessment & Plan Note (Signed)
Foot exam by MD today; managed by endo

## 2018-07-17 NOTE — Assessment & Plan Note (Signed)
Encouraged weight loss; see AVS 

## 2018-07-17 NOTE — Patient Instructions (Addendum)
Use the new medicines Call us if not getting better  Check out the information at familydoctor.org entitled "Nutrition for Weight Loss: What You Need to Know about Fad Diets" Try to lose between 1-2 pounds per week by taking in fewer calories and burning off more calories You can succeed by limiting portions, limiting foods dense in calories and fat, becoming more active, and drinking 8 glasses of water a day (64 ounces) Don't skip meals, especially breakfast, as skipping meals may alter your metabolism Do not use over-the-counter weight loss pills or gimmicks that claim rapid weight loss A healthy BMI (or body mass index) is between 18.5 and 24.9 You can calculate your ideal BMI at the [place] website ClubMonetize.fr   Obesity, Adult Obesity is the condition of having too much total body fat. Being overweight or obese means that your weight is greater than what is considered healthy for your body size. Obesity is determined by a measurement called BMI. BMI is an estimate of body fat and is calculated from height and weight. For adults, a BMI of 30 or higher is considered obese. Obesity can eventually lead to other health concerns and major illnesses, including:  Stroke.  Coronary artery disease (CAD).  Type 2 diabetes.  Some types of cancer, including cancers of the colon, breast, uterus, and gallbladder.  Osteoarthritis.  High blood pressure (hypertension).  High cholesterol.  Sleep apnea.  Gallbladder stones.  Infertility problems. What are the causes? The main cause of obesity is taking in (consuming) more calories than your body uses for energy. Other factors that contribute to this condition may include:  Being born with genes that make you more likely to become obese.  Having a medical condition that causes obesity. These conditions include: ? Hypothyroidism. ? Polycystic ovarian syndrome (PCOS). ? Binge-eating  disorder. ? Cushing syndrome.  Taking certain medicines, such as steroids, antidepressants, and seizure medicines.  Not being physically active (sedentary lifestyle).  Living where there are limited places to exercise safely or buy healthy foods.  Not getting enough sleep. What increases the risk? The following factors may increase your risk of this condition:  Having a family history of obesity.  Being a woman of African-American descent.  Being a man of Hispanic descent. What are the signs or symptoms? Having excessive body fat is the main symptom of this condition. How is this diagnosed? This condition may be diagnosed based on:  Your symptoms.  Your medical history.  A physical exam. Your health care provider may measure: ? Your BMI. If you are an adult with a BMI between 25 and less than 30, you are considered overweight. If you are an adult with a BMI of 30 or higher, you are considered obese. ? The distances around your hips and your waist (circumferences). These may be compared to each other to help diagnose your condition. ? Your skinfold thickness. Your health care provider may gently pinch a fold of your skin and measure it. How is this treated? Treatment for this condition often includes changing your lifestyle. Treatment may include some or all of the following:  Dietary changes. Work with your health care provider and a dietitian to set a weight-loss goal that is healthy and reasonable for you. Dietary changes may include eating: ? Smaller portions. A portion size is the amount of a particular food that is healthy for you to eat at one time. This varies from person to person. ? Low-calorie or low-fat options. ? More whole grains, fruits, and  vegetables.  Regular physical activity. This may include aerobic activity (cardio) and strength training.  Medicine to help you lose weight. Your health care provider may prescribe medicine if you are unable to lose 1 pound  a week after 6 weeks of eating more healthily and doing more physical activity.  Surgery. Surgical options may include gastric banding and gastric bypass. Surgery may be done if: ? Other treatments have not helped to improve your condition. ? You have a BMI of 40 or higher. ? You have life-threatening health problems related to obesity. Follow these instructions at home:  Eating and drinking   Follow recommendations from your health care provider about what you eat and drink. Your health care provider may advise you to: ? Limit fast foods, sweets, and processed snack foods. ? Choose low-fat options, such as low-fat milk instead of whole milk. ? Eat 5 or more servings of fruits or vegetables every day. ? Eat at home more often. This gives you more control over what you eat. ? Choose healthy foods when you eat out. ? Learn what a healthy portion size is. ? Keep low-fat snacks on hand. ? Avoid sugary drinks, such as soda, fruit juice, iced tea sweetened with sugar, and flavored milk. ? Eat a healthy breakfast.  Drink enough water to keep your urine clear or pale yellow.  Do not go without eating for long periods of time (do not fast) or follow a fad diet. Fasting and fad diets can be unhealthy and even dangerous. Physical Activity  Exercise regularly, as told by your health care provider. Ask your health care provider what types of exercise are safe for you and how often you should exercise.  Warm up and stretch before being active.  Cool down and stretch after being active.  Rest between periods of activity. Lifestyle  Limit the time that you spend in front of your TV, computer, or video game system.  Find ways to reward yourself that do not involve food.  Limit alcohol intake to no more than 1 drink a day for nonpregnant women and 2 drinks a day for men. One drink equals 12 oz of beer, 5 oz of wine, or 1 oz of hard liquor. General instructions  Keep a weight loss journal to  keep track of the food you eat and how much you exercise you get.  Take over-the-counter and prescription medicines only as told by your health care provider.  Take vitamins and supplements only as told by your health care provider.  Consider joining a support group. Your health care provider may be able to recommend a support group.  Keep all follow-up visits as told by your health care provider. This is important. Contact a health care provider if:  You are unable to meet your weight loss goal after 6 weeks of dietary and lifestyle changes. This information is not intended to replace advice given to you by your health care provider. Make sure you discuss any questions you have with your health care provider. Document Released: 07/18/2004 Document Revised: 11/13/2015 Document Reviewed: 03/29/2015 Elsevier Interactive Patient Education  2019 Elsevier Inc.  Preventing Unhealthy [employer], Adult Staying at a healthy weight is important to your overall health. When fat builds up in your body, you may become overweight or obese. Being overweight or obese increases your risk of developing certain health problems, such as heart disease, diabetes, sleeping problems, joint problems, and some types of cancer. Unhealthy weight gain is often the result of making  unhealthy food choices or not getting enough exercise. You can make changes to your lifestyle to prevent obesity and stay as healthy as possible. What nutrition changes can be made?   Eat only as much as your body needs. To do this: ? Pay attention to signs that you are hungry or full. Stop eating as soon as you feel full. ? If you feel hungry, try drinking water first before eating. Drink enough water so your urine is clear or pale yellow. ? Eat smaller portions. Pay attention to portion sizes when eating out. ? Look at serving sizes on food labels. Most foods contain more than one serving per container. ? Eat the recommended number of  calories for your gender and activity level. For most active people, a daily total of 2,000 calories is appropriate. If you are trying to lose weight or are not very active, you may need to eat fewer calories. Talk with your health care provider or a diet and nutrition specialist (dietitian) about how many calories you need each day.  Choose healthy foods, such as: ? Fruits and vegetables. At each meal, try to fill at least half of your plate with fruits and vegetables. ? Whole grains, such as whole-wheat bread, brown rice, and quinoa. ? Lean meats, such as chicken or fish. ? Other healthy proteins, such as beans, eggs, or tofu. ? Healthy fats, such as nuts, seeds, fatty fish, and olive oil. ? Low-fat or fat-free dairy products.  Check food labels, and avoid food and drinks that: ? Are high in calories. ? Have added sugar. ? Are high in sodium. ? Have saturated fats or trans fats.  Cook foods in healthier ways, such as by baking, broiling, or grilling.  Make a meal plan for the week, and shop with a grocery list to help you stay on track with your purchases. Try to avoid going to the grocery store when you are hungry.  When grocery shopping, try to shop around the outside of the store first, where the fresh foods are. Doing this helps you to avoid prepackaged foods, which can be high in sugar, salt (sodium), and fat. What lifestyle changes can be made?   Exercise for 30 or more minutes on 5 or more days each week. Exercising may include brisk walking, yard work, biking, running, swimming, and team sports like basketball and soccer. Ask your health care provider which exercises are safe for you.  Do muscle-strengthening activities, such as lifting weights or using resistance bands, on 2 or more days a week.  Do not use any products that contain nicotine or tobacco, such as cigarettes and e-cigarettes. If you need help quitting, ask your health care provider.  Limit alcohol intake to no  more than 1 drink a day for nonpregnant women and 2 drinks a day for men. One drink equals 12 oz of beer, 5 oz of wine, or 1 oz of hard liquor.  Try to get 7-9 hours of sleep each night. What other changes can be made?  Keep a food and activity journal to keep track of: ? What you ate and how many calories you had. Remember to count the calories in sauces, dressings, and side dishes. ? Whether you were active, and what exercises you did. ? Your calorie, weight, and activity goals.  Check your weight regularly. Track any changes. If you notice you have gained weight, make changes to your diet or activity routine.  Avoid taking weight-loss medicines or supplements. Talk to your  health care provider before starting any new medicine or supplement.  Talk to your health care provider before trying any new diet or exercise plan. Why are these changes important? Eating healthy, staying active, and having healthy habits can help you to prevent obesity. Those changes also:  Help you manage stress and emotions.  Help you connect with friends and family.  Improve your self-esteem.  Improve your sleep.  Prevent long-term health problems. What can happen if changes are not made? Being obese or overweight can cause you to develop joint or bone problems, which can make it hard for you to stay active or do activities you enjoy. Being obese or overweight also puts stress on your heart and lungs and can lead to health problems like diabetes, heart disease, and some cancers. Where to find more information Talk with your health care provider or a dietitian about healthy eating and healthy lifestyle choices. You may also find information from:  U.S. Department of Agriculture, MyPlate: FormerBoss.no  American Heart Association: www.heart.org  Centers for Disease Control and Prevention: http://www.wolf.info/ Summary  Staying at a healthy weight is important to your overall health. It helps you to  prevent certain diseases and health problems, such as heart disease, diabetes, joint problems, sleep disorders, and some types of cancer.  Being obese or overweight can cause you to develop joint or bone problems, which can make it hard for you to stay active or do activities you enjoy.  You can prevent unhealthy weight gain by eating a healthy diet, exercising regularly, not smoking, limiting alcohol, and getting enough sleep.  Talk with your health care provider or a dietitian for guidance about healthy eating and healthy lifestyle choices. This information is not intended to replace advice given to you by your health care provider. Make sure you discuss any questions you have with your health care provider. Document Released: 06/11/2016 Document Revised: 03/21/2017 Document Reviewed: 07/17/2016 Elsevier Interactive Patient Education  2019 [employer].

## 2018-07-18 LAB — CULTURE, GROUP A STREP
MICRO NUMBER:: 101322
SPECIMEN QUALITY:: ADEQUATE

## 2018-07-25 ENCOUNTER — Other Ambulatory Visit: Payer: Self-pay | Admitting: Family Medicine

## 2018-07-25 DIAGNOSIS — I10 Essential (primary) hypertension: Secondary | ICD-10-CM

## 2018-07-27 ENCOUNTER — Ambulatory Visit: Payer: Self-pay

## 2018-07-27 ENCOUNTER — Other Ambulatory Visit: Payer: Self-pay | Admitting: Family Medicine

## 2018-07-27 MED ORDER — FLUCONAZOLE 150 MG PO TABS: 150.0000 mg | ORAL_TABLET | ORAL | 0 refills | Status: DC

## 2018-07-27 NOTE — Telephone Encounter (Signed)
Sent diflucan also

## 2018-07-27 NOTE — Telephone Encounter (Signed)
Pt dx with thrush on 07/17/18 and prescribed Nystatin. Pt stated that the thrush is slightly better. Pt c/o soreness under tongue and to the back of the throat. Advised pt to continue using the Nystatin (pt stated that she has half a bottle left) and rinse her mouth out after she uses her inhaler. No fever, earache,chest congestion. Pt is hoarse.  Care advice given and pt verbalized understanding. Routing to office for provider to review.    Reason for Disposition . White patches that stick to tongue or inner cheek  Answer Assessment - Initial Assessment Questions 1. SYMPTOM: "What's the main symptom you're concerned about?" (e.g., dry mouth. chapped lips, lump)     Underside of tongue pain and throat is sore dx with thrush and has been on Nystatin for 9-10 days.  2. ONSET: "When did the  *No Answer*  start?"     *No Answer* 3. PAIN: "Is there any pain?" If so, ask: "How bad is it?" (Scale: 1-10; mild, moderate, severe)    Moderate 4 at night 6  4. CAUSE: "What do you think is causing the symptoms?"     thrush 5. OTHER SYMPTOMS: "Do you have any other symptoms?" (e.g., fever, sore throat, toothache, swelling)     Sore throat, fatigue, no out of the country visits 6. PREGNANCY: "Is there any chance you are pregnant?" "When was your last menstrual period?"     n/a  Answer Assessment - Initial Assessment Questions 1. ONSET: "When did the mouth start hurting?" (e.g., hours or days ago)      07/17/18 2. SEVERITY: "How bad is the pain?" (Scale 1-10; mild, moderate or severe)   - MILD (1-3):  doesn't interfere with eating or normal activities   - MODERATE (4-7): interferes with eating some solids and normal activities   - SEVERE (8-10):  excruciating pain, interferes with most normal activities   - SEVERE DYSPHAGIA: can't swallow liquids, drooling     Moderate during the day and night 3. SORES: "Are there any sores or ulcers in the mouth?" If so, ask: "What part of the mouth are the sores in?"     Oral thrush tongue back of throat 4. FEVER: "Do you have a fever?" If so, ask: "What is your temperature, how was it measured, and when did it start?"     no 5. CAUSE: "What do you think is causing the mouth pain?"    thrush 6. OTHER SYMPTOMS: "Do you have any other symptoms?" (e.g., difficulty breathing) Fatigue Pt has not been out of the country.  Protocols used: MOUTH PAIN-A-AH, MOUTH [facility]

## 2018-07-28 ENCOUNTER — Ambulatory Visit: Payer: Self-pay | Admitting: *Deleted

## 2018-07-28 NOTE — Telephone Encounter (Signed)
Pt diagnosed with  thrush 07/17/2018 and placed on nystatin suspension. Diflucan called in yesterday, pt picked up today. Questioning if she should use these together. Also states she takes Methotrexate weekly and is scheduled to take today. Asking if it is OK to take with diflucan and nystatin.  Please advise:  [phone]  Reason for Disposition . Pharmacy calling with prescription questions and triager unable to answer question  Answer Assessment - Initial Assessment Questions 1. SYMPTOMS: "Do you have any symptoms?"     thrush 2. SEVERITY: If symptoms are present, ask "Are they mild, moderate or severe?"    Improving "A little"  Protocols used: MEDICATION QUESTION CALL-A-AH

## 2018-07-29 ENCOUNTER — Encounter: Payer: Self-pay | Admitting: Family Medicine

## 2018-07-29 NOTE — Telephone Encounter (Signed)
Yes, she can use both

## 2018-07-29 NOTE — Telephone Encounter (Signed)
Patient called.  Patient aware.  

## 2018-08-10 ENCOUNTER — Other Ambulatory Visit: Payer: Self-pay

## 2018-08-10 MED ORDER — FLUTICASONE PROPIONATE 50 MCG/ACT NA SUSP: 2.0000 | Freq: Every day | NASAL | 1 refills | Status: DC

## 2018-08-10 NOTE — Telephone Encounter (Signed)
Refill request for general medication: Flonase 50 mcg/act nasal spray  Last office visit: 07/17/2018  Last physical exam: None indicated  Follow-ups on file. 09/30/2018

## 2018-08-17 ENCOUNTER — Encounter: Payer: Self-pay | Admitting: Family Medicine

## 2018-08-19 LAB — HEMOGLOBIN A1C: Hemoglobin A1C: 7.4

## 2018-08-20 ENCOUNTER — Ambulatory Visit (INDEPENDENT_AMBULATORY_CARE_PROVIDER_SITE_OTHER): Payer: Medicare Other | Admitting: Family Medicine

## 2018-08-20 ENCOUNTER — Encounter: Payer: Self-pay | Admitting: Family Medicine

## 2018-08-20 VITALS — BP 118/76 | HR 85 | Temp 98.5°F | Ht 66.0 in | Wt 187.8 lb

## 2018-08-20 DIAGNOSIS — B370 Candidal stomatitis: Secondary | ICD-10-CM | POA: Diagnosis not present

## 2018-08-20 DIAGNOSIS — L4050 Arthropathic psoriasis, unspecified: Secondary | ICD-10-CM

## 2018-08-20 DIAGNOSIS — R220 Localized swelling, mass and lump, head: Secondary | ICD-10-CM

## 2018-08-20 MED ORDER — FLUCONAZOLE 150 MG PO TABS: 150.0000 mg | ORAL_TABLET | ORAL | 0 refills | Status: DC

## 2018-08-20 NOTE — Progress Notes (Signed)
BP 118/76   Pulse 85   Temp 98.5 F (36.9 C)   Ht 5' 6"  (1.676 m)   Wt 187 lb 12.8 oz (85.2 kg)   SpO2 98%   BMI 30.31 kg/m    Subjective:    Patient ID: Mary Cantrell, female    DOB: 1946/01/05, 73 y.o.   MRN: [id number]  HPI: Mary Cantrell is a 73 y.o. female  Chief Complaint  Patient presents with  . Follow-up    HPI Patient is here for sore throat, thrush Last night was very swollen and she says the thrush started to get better then came back She is requesting another round of treatment for longer Side of the tongue was swollen and had trouble swallowing Took the medicine and got better, but not 100% "I have always had sore throats" She has seen ENT for this; they told her she has postnasal drip She saw someone at [place] Type 2 diabetes; saw Malissa Hippo, NP; she read her meter and a few ups and downs; lowest 67; did have a 270 glucose last week after [place] food She saw Dr. Raul Del for her lungs last week  Depression screen [facility] 2/9 08/20/2018 07/17/2018 04/10/2018 04/06/2018 12/04/2017  Decreased Interest 2 0 0 0 1  Down, Depressed, Hopeless 0 0 0 0 0  PHQ - 2 Score 2 0 0 0 1  Altered sleeping 0 0 0 0 0  Tired, decreased energy 2 0 0 0 1  Change in appetite 3 0 0 0 1  Feeling bad or failure about yourself  0 0 0 0 0  Trouble concentrating 0 0 0 0 0  Moving slowly or fidgety/restless 0 0 0 0 0  Suicidal thoughts 0 0 - 0 0  PHQ-9 Score 7 0 0 0 3  Difficult doing work/chores Not difficult at all Not difficult at all Not difficult at all Not difficult at all Not difficult at all  Some recent data might be hidden   Fall Risk  08/20/2018 07/17/2018 04/10/2018 04/06/2018 12/04/2017  Falls in the past year? 0 0 No No Yes  Comment - - - - -  Number falls in past yr: - 0 - - 1  Injury with Fall? - 0 - - Yes  Comment - - - - -  Risk for fall due to : - - - - -  Follow up - - - - Falls prevention discussed    Relevant past medical, surgical, family and social  history reviewed Past Medical History:  Diagnosis Date  . Achilles tendinitis of right lower extremity   . Allergy   . Asthma    Moderate  . Chronic back pain   . Depression   . Gastroparesis    DM  . Hyperlipidemia   . Hypertension   . Hypoxemia   . Irritable bowel syndrome   . Maculopathy   . NASH (nonalcoholic steatohepatitis)   . Nuclear sclerosis of both eyes   . OA (osteoarthritis)    Hips  . OSA (obstructive sleep apnea)   . Post-void dribbling   . Psoriasis   . Regurgitation   . SOB (shortness of breath)   . Tear of left rotator cuff   . TMJ (dislocation of temporomandibular joint)   . Trochanteric bursitis of right hip    Past Surgical History:  Procedure Laterality Date  . ABDOMINAL HYSTERECTOMY    . APPENDECTOMY    . ARTERY BIOPSY N/A 02/23/2015   Procedure: BIOPSY TEMPORAL ARTERY;  Surgeon: Algernon Huxley, MD;  Location: [facility] ORS;  Service: Vascular;  Laterality: N/A;  . BREAST BIOPSY Left   . EXCISION / CURETTAGE BONE CYST FINGER     Left Index Finger  . PILONIDAL CYST EXCISION    . SHOULDER ARTHROSCOPY Right [id number]   Dr. Mauri Pole  . STOMACH SURGERY  1941   3 arteries leaking in stomach after appendectomy  . TONSILLECTOMY     Family History  Problem Relation Age of Onset  . Rheum arthritis Father   . Diabetes Father   . Pneumonia Father   . Pneumonia Mother   . Diabetes Sister   . Osteoarthritis Sister    Social History   Tobacco Use  . Smoking status: Never Smoker  . Smokeless tobacco: Never Used  Substance Use Topics  . Alcohol use: No    Alcohol/week: 0.0 standard drinks  . Drug use: No     Office Visit from 08/20/2018 in [facility]  AUDIT-C Score  0      Interim medical history since last visit reviewed. Allergies and medications reviewed  Review of Systems Per HPI unless specifically indicated above     Objective:    BP 118/76   Pulse 85   Temp 98.5 F (36.9 C)   Ht 5' 6"  (1.676 m)   Wt 187 lb 12.8 oz  (85.2 kg)   SpO2 98%   BMI 30.31 kg/m   Wt Readings from Last 3 Encounters:  08/20/18 187 lb 12.8 oz (85.2 kg)  07/17/18 185 lb 14.4 oz (84.3 kg)  04/10/18 180 lb 9.6 oz (81.9 kg)    Physical Exam Constitutional:      Appearance: She is well-developed. She is obese.  HENT:     Head: Normocephalic and atraumatic.     Right Ear: Tympanic membrane and ear canal normal. There is no impacted cerumen.     Left Ear: Tympanic membrane and ear canal normal. There is no impacted cerumen.     Nose: No rhinorrhea.     Mouth/Throat:     Mouth: Mucous membranes are moist. No oral lesions or angioedema.     Dentition: No dental abscesses or gum lesions.     Pharynx: No oropharyngeal exudate or posterior oropharyngeal erythema.     Comments: Mild erythema of the tongue, irritated papilla; no significant swelling of tissues of the oral cavity; posterior oropharynx does not look compromised in the least; no edema Eyes:     General: No scleral icterus. Cardiovascular:     Rate and Rhythm: Normal rate and regular rhythm.  Pulmonary:     Effort: Pulmonary effort is normal.     Breath sounds: Normal breath sounds.  Lymphadenopathy:     Cervical: No cervical adenopathy.  Neurological:     Mental Status: She is alert.  Psychiatric:        Behavior: Behavior normal.     Results for orders placed or performed in visit on 08/17/18  Hemoglobin A1c  Result Value Ref Range   Hemoglobin A1C 7.1       Assessment & Plan:   Problem List Items Addressed This Visit      Musculoskeletal and Integument   Psoriatic arthritis ([place])    Managed by specialist; advised to not take MTX while taking diflucan, discuss restarting with specialist       Other Visit Diagnoses    Thrush    -  Primary   Relevant Medications   fluconazole (DIFLUCAN) 150 MG  tablet   Tongue swelling       on the side per patient lsat night; appears normal today; patient was advised to see ENT if sx worsen, not resolved after this  treatment       Follow up plan: No follow-ups on file.  An after-visit summary was printed and given to the patient at [place].  Please see the patient instructions which may contain other information and recommendations beyond what is mentioned above in the assessment and plan.  Meds ordered this encounter  Medications  . fluconazole (DIFLUCAN) 150 MG tablet    Sig: Take 1 tablet (150 mg total) by mouth every other day. Do not take your pravastatin while on this and for 3 days afterwards    Dispense:  3 tablet    Refill:  0    No orders of the defined types were placed in this encounter.

## 2018-08-20 NOTE — Patient Instructions (Signed)
Start on the diflucan again Do not take your pravastatin while on the diflucan (fluconazole) and do not resume for at least 3 days after your last pill Do see your Ear Nose Throat doctor if this is not 100% cleared up in the next few weeks Do not take your methotrexate this weekend; consult with your prescriber about whether or not to resume next week

## 2018-08-27 NOTE — Assessment & Plan Note (Signed)
Managed by specialist; advised to not take MTX while taking diflucan, discuss restarting with specialist

## 2018-09-30 ENCOUNTER — Ambulatory Visit: Payer: Medicare Other | Admitting: Family Medicine

## 2018-10-07 ENCOUNTER — Other Ambulatory Visit: Payer: Self-pay | Admitting: Family Medicine

## 2018-10-24 ENCOUNTER — Other Ambulatory Visit: Payer: Self-pay | Admitting: Family Medicine

## 2018-10-24 DIAGNOSIS — E1143 Type 2 diabetes mellitus with diabetic autonomic (poly)neuropathy: Secondary | ICD-10-CM

## 2018-10-24 DIAGNOSIS — I10 Essential (primary) hypertension: Secondary | ICD-10-CM

## 2018-10-24 DIAGNOSIS — E1169 Type 2 diabetes mellitus with other specified complication: Secondary | ICD-10-CM

## 2018-10-24 DIAGNOSIS — E669 Obesity, unspecified: Secondary | ICD-10-CM

## 2018-11-11 ENCOUNTER — Telehealth: Payer: Self-pay | Admitting: Family Medicine

## 2018-11-11 NOTE — Telephone Encounter (Signed)
Spoke to pt to schedule a virtual appt. Pt states Mary Cantrell will not refill meds until pt gets her levels checked. Pt is refusing to walk into any lab in fear of catching the Covid. Pt states she does not want an appt here because she will need labs and is not willing to do them.

## 2018-11-11 NOTE — Telephone Encounter (Signed)
I can order Dr. Audelia Hives labs in our office, but I cannot rx the medications that she gets from Rheumatologist  Previous note was erroneous patient

## 2018-11-11 NOTE — Telephone Encounter (Signed)
Pt called to state that she has a lab appt scheduled with [facility] tomorrow. Made aware that labs were available in our office, but is going to follow through with [facility] appt.

## 2018-11-11 NOTE — Telephone Encounter (Signed)
Left voicemail explaining Dr. Ancil Boozer is unable to take over Dr. Audelia Hives medication but we are able to offer her to have labs done in our office.

## 2018-11-11 NOTE — Telephone Encounter (Signed)
Copied from [place] [id number]. Topic: Quick Communication - Rx Refill/Question >> Nov 11, 2018 11:23 AM Blase Mess A wrote: Medication: predniSONE (DELTASONE) 2.5 MG tablet [[id number]] , folic acid (FOLVITE) 1 MG tablet [[id number]] Patient has been having hard time getting her medication from another doctor. Requesting if Dr. Ancil Boozer can refill her medication. The doctors are requesting OV. And the patient does not feel comfortable going into the office.  Has the patient contacted their pharmacy? Yes  (Agent: If no, request that the patient contact the pharmacy for the refill.) (Agent: If yes, when and what did the pharmacy advise?)  Preferred Pharmacy (with phone number or street name):  [facility], [place] [phone] (Phone) [phone] (Fax)     Agent: Please be advised that RX refills may take up to 3 business days. We ask that you follow-up with your pharmacy.

## 2018-11-12 LAB — BASIC METABOLIC PANEL
BUN: 21 (ref 4–21)
Creatinine: 1 (ref 0.5–1.1)
Glucose: 83
Potassium: 4.3 (ref 3.4–5.3)
Sodium: 144 (ref 137–147)

## 2018-11-12 LAB — POCT ERYTHROCYTE SEDIMENTATION RATE, NON-AUTOMATED: Sed Rate: 4

## 2018-11-12 LAB — HEPATIC FUNCTION PANEL
ALT: 40 — AB (ref 7–35)
AST: 30 (ref 13–35)
Alkaline Phosphatase: 78 (ref 25–125)

## 2018-12-02 ENCOUNTER — Encounter: Payer: Self-pay | Admitting: Family Medicine

## 2018-12-02 ENCOUNTER — Other Ambulatory Visit: Payer: Self-pay

## 2018-12-02 ENCOUNTER — Ambulatory Visit (INDEPENDENT_AMBULATORY_CARE_PROVIDER_SITE_OTHER): Payer: Medicare Other | Admitting: Family Medicine

## 2018-12-02 DIAGNOSIS — S31829A Unspecified open wound of left buttock, initial encounter: Secondary | ICD-10-CM

## 2018-12-02 DIAGNOSIS — E669 Obesity, unspecified: Secondary | ICD-10-CM

## 2018-12-02 DIAGNOSIS — E1169 Type 2 diabetes mellitus with other specified complication: Secondary | ICD-10-CM | POA: Diagnosis not present

## 2018-12-02 DIAGNOSIS — Z79899 Other long term (current) drug therapy: Secondary | ICD-10-CM | POA: Diagnosis not present

## 2018-12-02 MED ORDER — MUPIROCIN 2 % EX OINT: 1.0000 "application " | TOPICAL_OINTMENT | Freq: Two times a day (BID) | CUTANEOUS | 0 refills | Status: DC

## 2018-12-02 MED ORDER — CLINDAMYCIN HCL 300 MG PO CAPS: 300.0000 mg | ORAL_CAPSULE | Freq: Four times a day (QID) | ORAL | 0 refills | Status: AC

## 2018-12-02 NOTE — Progress Notes (Signed)
Name: Mary Cantrell   MRN: [id number]    DOB: [birth year]   Date:12/02/2018       Progress Note  Subjective  Chief Complaint  Chief Complaint  Patient presents with  . Cyst    sore on buttock, patient diabetic    I connected with  Mary Cantrell  on 12/02/18 at 11:00 AM EDT by a video enabled telemedicine application and verified that I am speaking with the correct person using two identifiers.  I discussed the limitations of evaluation and management by telemedicine and the availability of in person appointments. The patient expressed understanding and agreed to proceed. Staff also discussed with the patient that there may be a patient responsible charge related to this service. Patient Location: Home Provider Location: Home Additional Individuals present: None  HPI  Pt presents with concern for wound to the LEFT buttock for about 10 days now.  She has been applying gentamycin sulfate cream that she had leftover, and this does not seem to be helping much.She denies feeling like there is fluid in the area. - She denies swelling, endorses tenderness.  No drainage.  - Has DM and most recent A1C was 7.4%.  Taking glipizide and Metformin.  - She is on Methotrexate and Prednisone (7.5) for (has hx PMR).  Patient Active Problem List   Diagnosis Date Noted  . Obesity (BMI 30.0-34.9) 07/17/2018  . 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 Psoriatic arthritis ([place]) 12/04/2017  . Stenosis of left carotid artery 12/16/2016  . Polymyalgia rheumatica ([place]) 04/13/2015  . Benign essential HTN 12/21/2014  . Back pain, chronic 12/21/2014  . Narrowing of intervertebral disc space 12/21/2014  . Diabetic gastroparesis ([place]) 12/21/2014  . Amaurosis fugax of left eye 12/21/2014  . H/O: hypothyroidism 12/21/2014  . Hyperlipidemia 12/21/2014  . IBS (irritable bowel syndrome) 12/21/2014  . Disorder of macula of retina 12/21/2014  .  Asthma, moderate 12/21/2014  . NASH (nonalcoholic steatohepatitis) 12/21/2014  . NS (nuclear sclerosis) 12/21/2014  . Osteoarthrosis 12/21/2014  . Psoriasis 12/21/2014  . Knee torn cartilage 12/21/2014  . Closed dislocation of jaw 12/21/2014  . Diabetes mellitus type 2 in obese ([place]) 12/21/2014  . Obstructive apnea 01/03/2014  . History of shoulder surgery 04/23/2011    Past Surgical History:  Procedure Laterality Date  . ABDOMINAL HYSTERECTOMY    . APPENDECTOMY    . ARTERY BIOPSY N/A 02/23/2015   Procedure: BIOPSY TEMPORAL ARTERY;  Surgeon: Algernon Huxley, MD;  Location: [facility] ORS;  Service: Vascular;  Laterality: N/A;  . BREAST BIOPSY Left   . EXCISION / CURETTAGE BONE CYST FINGER     Left Index Finger  . PILONIDAL CYST EXCISION    . SHOULDER ARTHROSCOPY Right [id number]   Dr. Mauri Pole  . STOMACH SURGERY  1941   3 arteries leaking in stomach after appendectomy  . TONSILLECTOMY      Family History  Problem Relation Age of Onset  . Rheum arthritis Father   . Diabetes Father   . Pneumonia Father   . Pneumonia Mother   . Diabetes Sister   . Osteoarthritis Sister     Social History   Socioeconomic History  . Marital status: Married    Spouse name: Not on file  . Number of children: 2  . Years of education: Not on file  . Highest education level: Not on file  Occupational History  . Occupation: retired  Scientific laboratory technician  . Financial  resource strain: Not hard at all  . Food insecurity:    Worry: Never true    Inability: Never true  . Transportation needs:    Medical: No    Non-medical: No  Tobacco Use  . Smoking status: Never Smoker  . Smokeless tobacco: Never Used  Substance and Sexual Activity  . Alcohol use: No    Alcohol/week: 0.0 standard drinks  . Drug use: No  . Sexual activity: Yes    Partners: Male    Comment: hysterectomy  Lifestyle  . Physical activity:    Days per week: 7 days    Minutes per session: 30 min  . Stress: Not at all  Relationships  .  Social connections:    Talks on phone: More than three times a week    Gets together: More than three times a week    Attends religious service: Never    Active member of club or organization: Yes    Attends meetings of clubs or organizations: More than 4 times per year    Relationship status: Married  . Intimate partner violence:    Fear of current or ex partner: No    Emotionally abused: No    Physically abused: No    Forced sexual activity: No  Other Topics Concern  . Not on file  Social History Narrative  . Not on file     Current Outpatient Medications:  .  albuterol (VENTOLIN HFA) 108 (90 BASE) MCG/ACT inhaler, Inhale 2 puffs into the lungs every 4 (four) hours as needed for wheezing (takes every am and then as needed.). , Disp: , Rfl:  .  aspirin 81 MG tablet, Take 81 mg by mouth every morning. , Disp: , Rfl:  .  Azelastine HCl 0.15 % SOLN, Place 1 spray into the nose daily. , Disp: , Rfl:  .  Calcium Carbonate-Vit D-Min ([employer] CALCIUM 600 + D/MINERALS) 600-400 MG-UNIT TABS, Take 1 tablet by mouth daily., Disp: , Rfl: 1 .  Cinnamon 500 MG capsule, Take 1 capsule by mouth 2 (two) times daily., Disp: , Rfl:  .  cyanocobalamin 100 MCG tablet, Take 100 mcg by mouth once a week. , Disp: , Rfl:  .  dicyclomine (BENTYL) 20 MG tablet, TAKE 1 TABLET FOUR TIMES A DAY BEFORE MEALS AND AT BEDTIME, Disp: 100 tablet, Rfl: 14 .  fluticasone (FLONASE) 50 MCG/ACT nasal spray, Place 2 sprays into both nostrils at bedtime., Disp: 48 g, Rfl: 1 .  folic acid (FOLVITE) 1 MG tablet, , Disp: , Rfl:  .  gabapentin (NEURONTIN) 100 MG capsule, Take 2 capsules (200 mg total) by mouth at bedtime., Disp: 180 capsule, Rfl: 4 .  gentamicin cream (GARAMYCIN) 0.1 %, Apply 1 application topically 3 (three) times daily. (Patient taking differently: Apply 1 application topically as needed. ), Disp: 30 g, Rfl: 1 .  glipiZIDE (GLUCOTROL) 5 MG tablet, Take 5 mg by mouth daily before breakfast., Disp: , Rfl:  .   metFORMIN (GLUCOPHAGE-XR) 500 MG 24 hr tablet, TAKE 3 TABLETS (1500 MG) DAILY, Disp: 270 tablet, Rfl: 0 .  methotrexate (RHEUMATREX) 2.5 MG tablet, Take 6 tablets (15 mg total) by mouth once a week. Caution:Chemotherapy. Protect from light., Disp: , Rfl:  .  neomycin-polymyxin-hydrocortisone (CORTISPORIN) 3.5-10000-1 ophthalmic suspension, Place 2 drops into both eyes 4 (four) times daily. (Patient taking differently: Place 2 drops into both eyes as needed. ), Disp: 7.5 mL, Rfl: 0 .  nystatin (MYCOSTATIN) 100000 UNIT/ML suspension, Take 5 mLs (500,000 Units  total) by mouth 4 (four) times daily. Rinse around mouth, and then swallow; 7-10 days until symptoms are gone, Disp: 473 mL, Rfl: 0 .  olmesartan (BENICAR) 20 MG tablet, TAKE 1 TABLET DAILY, Disp: 90 tablet, Rfl: 0 .  omeprazole (PRILOSEC) 40 MG capsule, Take 40 mg by mouth daily., Disp: , Rfl:  .  ONE TOUCH ULTRA TEST test strip, TEST 2 (TWO) TIMES DAILY. DX: E11.69, E11.43. *INS.ONLY PAYS FOR ONCE DAILY TESTING*, Disp: 100 each, Rfl: 2 .  pravastatin (PRAVACHOL) 40 MG tablet, Take 1 tablet (40 mg total) by mouth daily., Disp: 90 tablet, Rfl: 3 .  predniSONE (DELTASONE) 2.5 MG tablet, Take 7.5 mg by mouth daily., Disp: , Rfl:  .  spironolactone-hydrochlorothiazide (ALDACTAZIDE) 25-25 MG tablet, TAKE ONE-HALF (1/2) TABLET DAILY, Disp: 45 tablet, Rfl: 0 .  tiZANidine (ZANAFLEX) 4 MG capsule, Take 4 mg by mouth 3 (three) times daily as needed for muscle spasms., Disp: , Rfl:  .  vitamin C (ASCORBIC ACID) 500 MG tablet, Take 1 tablet by mouth at bedtime. , Disp: , Rfl:  .  budesonide-formoterol (SYMBICORT) 160-4.5 MCG/ACT inhaler, Inhale 2 puffs into the lungs every morning., Disp: , Rfl:  .  fluconazole (DIFLUCAN) 150 MG tablet, Take 1 tablet (150 mg total) by mouth every other day. Do not take your pravastatin while on this and for 3 days afterwards (Patient not taking: Reported on 12/02/2018), Disp: 3 tablet, Rfl: 0 .  montelukast (SINGULAIR) 10 MG  tablet, Take by mouth., Disp: , Rfl:   Allergies  Allergen Reactions  . Doxycycline Other (See Comments)    Weakness, flu like symptoms  . Codeine     Other reaction(s): Other (See Comments) Dizzy  . Lantus  [Insulin Glargine]     made skin hot  . Metformin     Coated metformin is tolerated well.  Marland Kitchen Penicillin G     Other reaction(s): Unknown  . Sitagliptin   . Sulfa Antibiotics     Other reaction(s): Other (See Comments) Red burning skin    I personally reviewed active problem list, medication list, allergies, lab results with the patient/caregiver today.   ROS Ten systems reviewed and is negative except as mentioned in HPI  Objective  Virtual encounter, vitals not obtained.  There is no height or weight on file to calculate BMI.  Physical Exam Constitutional: Patient appears well-developed and well-nourished. No distress.  HENT: Head: Normocephalic and atraumatic.  Neck: Normal range of motion. Pulmonary/Chest: Effort normal. No respiratory distress. Speaking in complete sentences Neurological: Pt is alert and oriented to person, place, and time. Coordination, speech and gait are normal.  Psychiatric: Patient has a normal mood and affect. behavior is normal. Judgment and thought content normal. Skin: Photos reviewed and appears to have small open wound on LEFT buttock with healthy wound bed and some surrounding erythema. Patient denies fluctuance, though I am unable to evaluate.  No results found for this or any previous visit (from the past 72 hour(s)).  PHQ2/9: Depression screen [facility] 2/9 12/02/2018 08/20/2018 07/17/2018 04/10/2018 04/06/2018  Decreased Interest 0 2 0 0 0  Down, Depressed, Hopeless 0 0 0 0 0  PHQ - 2 Score 0 2 0 0 0  Altered sleeping 0 0 0 0 0  Tired, decreased energy 0 2 0 0 0  Change in appetite 0 3 0 0 0  Feeling bad or failure about yourself  0 0 0 0 0  Trouble concentrating 0 0 0 0 0  Moving slowly  or fidgety/restless 0 0 0 0 0  Suicidal  thoughts 0 0 0 - 0  PHQ-9 Score 0 7 0 0 0  Difficult doing work/chores Not difficult at all Not difficult at all Not difficult at all Not difficult at all Not difficult at all  Some recent data might be hidden   PHQ-2/9 Result is negative.    Fall Risk: Fall Risk  12/02/2018 08/20/2018 07/17/2018 04/10/2018 04/06/2018  Falls in the past year? 0 0 0 No No  Comment - - - - -  Number falls in past yr: 0 - 0 - -  Injury with Fall? 0 - 0 - -  Comment - - - - -  Risk for fall due to : - - - - -  Follow up Falls evaluation completed - - - -    Assessment & Plan  1. Diabetes mellitus type 2 in obese ([place]) - Continue current care, but monitor BG's for changes  2. Buttock wound, left, initial encounter - Keep open to air when possible, avoid pressure on that area; advised needs to call back in 7 days if not improving due to her increased risk of severe infection/prolonged wound healing time.  [facility] at that time.  - clindamycin (CLEOCIN) 300 MG capsule; Take 1 capsule (300 mg total) by mouth 4 (four) times daily for 7 days.  Dispense: 28 capsule; Refill: 0 - mupirocin ointment (BACTROBAN) 2 %; Place 1 application into the nose 2 (two) times daily.  Dispense: 22 g; Refill: 0 - Patient is allergic to PCNs, Sulfas, and Doxycycline.  Clindamycin is best option for MRSA coverage in PO Abx, so we will dose her with this today, however I do recommend she take a probiotic and monitor for severe diarrhea, she verbalizes understanding.  3. Immunocompromised state due to drug therapy - Pause Methotrexate for this time to allow time to heal.  I discussed the assessment and treatment plan with the patient. The patient was provided an opportunity to ask questions and all were answered. The patient agreed with the plan and demonstrated an understanding of the instructions.  The patient was advised to call back or seek an in-person evaluation if the symptoms worsen or if the  condition fails to improve as anticipated.  I provided 18 minutes of non-face-to-face time during this encounter.

## 2018-12-08 ENCOUNTER — Encounter: Payer: Self-pay | Admitting: Family Medicine

## 2018-12-08 ENCOUNTER — Other Ambulatory Visit: Payer: Self-pay | Admitting: Family Medicine

## 2018-12-08 DIAGNOSIS — E1169 Type 2 diabetes mellitus with other specified complication: Secondary | ICD-10-CM

## 2018-12-08 DIAGNOSIS — E785 Hyperlipidemia, unspecified: Secondary | ICD-10-CM

## 2018-12-08 NOTE — Telephone Encounter (Signed)
appt scheduled for tomorrow for med refill and wound check. she would like to know if you want her to come into the office or do a virtual visit.

## 2018-12-09 ENCOUNTER — Other Ambulatory Visit: Payer: Self-pay

## 2018-12-09 ENCOUNTER — Encounter: Payer: Self-pay | Admitting: Family Medicine

## 2018-12-09 ENCOUNTER — Ambulatory Visit (INDEPENDENT_AMBULATORY_CARE_PROVIDER_SITE_OTHER): Payer: Medicare Other | Admitting: Family Medicine

## 2018-12-09 VITALS — BP 119/78 | HR 79 | Temp 97.8°F | Ht 66.0 in | Wt 183.0 lb

## 2018-12-09 DIAGNOSIS — S31829D Unspecified open wound of left buttock, subsequent encounter: Secondary | ICD-10-CM

## 2018-12-09 DIAGNOSIS — N183 Chronic kidney disease, stage 3 unspecified: Secondary | ICD-10-CM | POA: Insufficient documentation

## 2018-12-09 DIAGNOSIS — I129 Hypertensive chronic kidney disease with stage 1 through stage 4 chronic kidney disease, or unspecified chronic kidney disease: Secondary | ICD-10-CM | POA: Diagnosis not present

## 2018-12-09 DIAGNOSIS — E785 Hyperlipidemia, unspecified: Secondary | ICD-10-CM

## 2018-12-09 DIAGNOSIS — E1169 Type 2 diabetes mellitus with other specified complication: Secondary | ICD-10-CM | POA: Diagnosis not present

## 2018-12-09 DIAGNOSIS — E669 Obesity, unspecified: Secondary | ICD-10-CM

## 2018-12-09 MED ORDER — PRAVASTATIN SODIUM 40 MG PO TABS: 40.0000 mg | ORAL_TABLET | Freq: Every day | ORAL | 3 refills | Status: DC

## 2018-12-09 NOTE — Progress Notes (Signed)
Name: Mary Cantrell   MRN: [id number]    DOB: [birth year]   Date:12/09/2018       Progress Note  Subjective  Chief Complaint  Chief Complaint  Patient presents with   Medication Refill   Wound Check    I connected with  Mary Cantrell  on 12/09/18 at 11:40 AM EDT by a video enabled telemedicine application and verified that I am speaking with the correct person using two identifiers.  I discussed the limitations of evaluation and management by telemedicine and the availability of in person appointments. The patient expressed understanding and agreed to proceed. Staff also discussed with the patient that there may be a patient responsible charge related to this service. Patient Location: at home Provider Location: [facility]   HPI  Ulceration of left buttocks: she noticed it on June 1st, the size has been about the same , the redness is down but has more of a white material in the center. She states today it was the first day that she noticed discomfort when she sat down. No fever or chills. She was seen by Raelyn Ensign NP on June 10th and is using a topical medication and also clindamycin, not sure if improving. I reviewed the pictures she attached today and it seems to be an ulceration with granulation tissue   DMII: under the care of endocrinologist , last A1C was 7.4% she is due for follow up in August. She denies polyphagia, polydipsia or polyuria, she is compliant with medication.   Hyperlipidemia: reviewed labs done by endo in Feb and she is doing well, we will send a refill of medication . LDL is at goal 71  Hypertension with CKI stage III: discussed lab results with her, bp at home usually at goal, no chest pain or palpitation . She is on ARB   Patient Active Problem List   Diagnosis Date Noted   Obesity (BMI 30.0-34.9) 07/17/2018   Hypertension associated with stage 2 chronic kidney disease due to type 2 diabetes mellitus ([name]) 03/15/2018   Psoriatic  arthritis ([place]) 12/04/2017   Stenosis of left carotid artery 12/16/2016   Polymyalgia rheumatica ([place]) 04/13/2015   Benign essential HTN 12/21/2014   Back pain, chronic 12/21/2014   Narrowing of intervertebral disc space 12/21/2014   Diabetic gastroparesis ([place]) 12/21/2014   Amaurosis fugax of left eye 12/21/2014   H/O: hypothyroidism 12/21/2014   Hyperlipidemia 12/21/2014   IBS (irritable bowel syndrome) 12/21/2014   Disorder of macula of retina 12/21/2014   Asthma, moderate 12/21/2014   NASH (nonalcoholic steatohepatitis) 12/21/2014   NS (nuclear sclerosis) 12/21/2014   Osteoarthrosis 12/21/2014   Psoriasis 12/21/2014   Knee torn cartilage 12/21/2014   Closed dislocation of jaw 12/21/2014   Diabetes mellitus type 2 in obese ([name]) 12/21/2014   Obstructive apnea 01/03/2014   History of shoulder surgery 04/23/2011    Past Surgical History:  Procedure Laterality Date   ABDOMINAL HYSTERECTOMY     APPENDECTOMY     ARTERY BIOPSY N/A 02/23/2015   Procedure: BIOPSY TEMPORAL ARTERY;  Surgeon: Algernon Huxley, MD;  Location: [facility] ORS;  Service: Vascular;  Laterality: N/A;   BREAST BIOPSY Left    EXCISION / CURETTAGE BONE CYST FINGER     Left Index Finger   PILONIDAL CYST EXCISION     SHOULDER ARTHROSCOPY Right [id number]   Dr. Mauri Pole   STOMACH SURGERY  1941   3 arteries leaking in stomach after appendectomy   TONSILLECTOMY      Family  History  Problem Relation Age of Onset   Rheum arthritis Father    Diabetes Father    Pneumonia Father    Pneumonia Mother    Diabetes Sister    Osteoarthritis Sister     Social History   Socioeconomic History   Marital status: Married    Spouse name: Not on file   Number of children: 2   Years of education: Not on file   Highest education level: Not on file  Occupational History   Occupation: retired  Scientist, product/process development strain: Not hard at all   Food insecurity    Worry: Never true     Inability: Never true   Transportation needs    Medical: No    Non-medical: No  Tobacco Use   Smoking status: Never Smoker   Smokeless tobacco: Never Used  Substance and Sexual Activity   Alcohol use: No    Alcohol/week: 0.0 standard drinks   Drug use: No   Sexual activity: Yes    Partners: Male    Comment: hysterectomy  Lifestyle   Physical activity    Days per week: 7 days    Minutes per session: 30 min   Stress: Not at all  Relationships   Social connections    Talks on phone: More than three times a week    Gets together: More than three times a week    Attends religious service: Never    Active member of club or organization: Yes    Attends meetings of clubs or organizations: More than 4 times per year    Relationship status: Married   Intimate partner violence    Fear of current or ex partner: No    Emotionally abused: No    Physically abused: No    Forced sexual activity: No  Other Topics Concern   Not on file  Social History Narrative   Not on file     Current Outpatient Medications:    albuterol (VENTOLIN HFA) 108 (90 BASE) MCG/ACT inhaler, Inhale 2 puffs into the lungs every 4 (four) hours as needed for wheezing (takes every am and then as needed.). , Disp: , Rfl:    aspirin 81 MG tablet, Take 81 mg by mouth every morning. , Disp: , Rfl:    Azelastine HCl 0.15 % SOLN, Place 1 spray into the nose daily. , Disp: , Rfl:    Calcium Carbonate-Vit D-Min ([employer] CALCIUM 600 + D/MINERALS) 600-400 MG-UNIT TABS, Take 1 tablet by mouth daily., Disp: , Rfl: 1   Cinnamon 500 MG capsule, Take 1 capsule by mouth 2 (two) times daily., Disp: , Rfl:    cyanocobalamin 100 MCG tablet, Take 100 mcg by mouth once a week. , Disp: , Rfl:    dicyclomine (BENTYL) 20 MG tablet, TAKE 1 TABLET FOUR TIMES A DAY BEFORE MEALS AND AT BEDTIME, Disp: 100 tablet, Rfl: 14   fluticasone (FLONASE) 50 MCG/ACT nasal spray, Place 2 sprays into both nostrils at bedtime., Disp: 48  g, Rfl: 1   folic acid (FOLVITE) 1 MG tablet, , Disp: , Rfl:    gabapentin (NEURONTIN) 100 MG capsule, Take 2 capsules (200 mg total) by mouth at bedtime., Disp: 180 capsule, Rfl: 4   gentamicin cream (GARAMYCIN) 0.1 %, Apply 1 application topically 3 (three) times daily. (Patient taking differently: Apply 1 application topically as needed. ), Disp: 30 g, Rfl: 1   glipiZIDE (GLUCOTROL) 5 MG tablet, Take 5 mg by mouth daily before breakfast., Disp: ,  Rfl:    metFORMIN (GLUCOPHAGE-XR) 500 MG 24 hr tablet, TAKE 3 TABLETS (1500 MG) DAILY, Disp: 270 tablet, Rfl: 0   mupirocin ointment (BACTROBAN) 2 %, Place 1 application into the nose 2 (two) times daily., Disp: 22 g, Rfl: 0   neomycin-polymyxin-hydrocortisone (CORTISPORIN) 3.5-10000-1 ophthalmic suspension, Place 2 drops into both eyes 4 (four) times daily. (Patient taking differently: Place 2 drops into both eyes as needed. ), Disp: 7.5 mL, Rfl: 0   nystatin (MYCOSTATIN) 100000 UNIT/ML suspension, Take 5 mLs (500,000 Units total) by mouth 4 (four) times daily. Rinse around mouth, and then swallow; 7-10 days until symptoms are gone, Disp: 473 mL, Rfl: 0   olmesartan (BENICAR) 20 MG tablet, TAKE 1 TABLET DAILY, Disp: 90 tablet, Rfl: 0   omeprazole (PRILOSEC) 40 MG capsule, Take 40 mg by mouth daily., Disp: , Rfl:    ONE TOUCH ULTRA TEST test strip, TEST 2 (TWO) TIMES DAILY. DX: E11.69, E11.43. *INS.ONLY PAYS FOR ONCE DAILY TESTING*, Disp: 100 each, Rfl: 2   pravastatin (PRAVACHOL) 40 MG tablet, Take 1 tablet (40 mg total) by mouth daily., Disp: 90 tablet, Rfl: 3   predniSONE (DELTASONE) 2.5 MG tablet, Take 7.5 mg by mouth daily., Disp: , Rfl:    spironolactone-hydrochlorothiazide (ALDACTAZIDE) 25-25 MG tablet, TAKE ONE-HALF (1/2) TABLET DAILY, Disp: 45 tablet, Rfl: 0   tiZANidine (ZANAFLEX) 4 MG capsule, Take 4 mg by mouth 3 (three) times daily as needed for muscle spasms., Disp: , Rfl:    vitamin C (ASCORBIC ACID) 500 MG tablet, Take 1  tablet by mouth at bedtime. , Disp: , Rfl:    budesonide-formoterol (SYMBICORT) 160-4.5 MCG/ACT inhaler, Inhale 2 puffs into the lungs every morning., Disp: , Rfl:    clindamycin (CLEOCIN) 300 MG capsule, Take 1 capsule (300 mg total) by mouth 4 (four) times daily for 7 days. (Patient not taking: Reported on 12/09/2018), Disp: 28 capsule, Rfl: 0   methotrexate (RHEUMATREX) 2.5 MG tablet, Take 6 tablets (15 mg total) by mouth once a week. Caution:Chemotherapy. Protect from light. (Patient not taking: Reported on 12/09/2018), Disp: , Rfl:    montelukast (SINGULAIR) 10 MG tablet, Take by mouth., Disp: , Rfl:   Allergies  Allergen Reactions   Doxycycline Other (See Comments)    Weakness, flu like symptoms   Codeine     Other reaction(s): Other (See Comments) Dizzy   Lantus  [Insulin Glargine]     made skin hot   Metformin     Coated metformin is tolerated well.   Penicillin G     Other reaction(s): Unknown   Sitagliptin    Sulfa Antibiotics     Other reaction(s): Other (See Comments) Red burning skin    I personally reviewed active problem list, medication list, allergies, family history, social history with the patient/caregiver today.   ROS  Ten systems reviewed and is negative except as mentioned in HPI   Objective  Today's Vitals   12/09/18 0935 12/09/18 1222  BP: (!) 131/107 119/78  Pulse: 79   Temp: 97.8 F (36.6 C)   TempSrc: Oral   SpO2: 97%   Weight: 183 lb (83 kg)   Height: 5' 6"  (1.676 m)   PainSc: 5     Body mass index is 29.54 kg/m.  Body mass index is 29.54 kg/m.  Physical Exam  Awake, alert and oriented, reviewed pictures of ulceration, husband measured the lesion and it was 0.75 in to 0.5 inches ulceration size, redness 1.25 x 0.75 inches in size. On  left buttocks  PHQ2/9: Depression screen [facility] 2/9 12/09/2018 12/02/2018 08/20/2018 07/17/2018 04/10/2018  Decreased Interest 1 0 2 0 0  Down, Depressed, Hopeless 1 0 0 0 0  PHQ - 2 Score 2 0 2 0  0  Altered sleeping 0 0 0 0 0  Tired, decreased energy 0 0 2 0 0  Change in appetite 0 0 3 0 0  Feeling bad or failure about yourself  0 0 0 0 0  Trouble concentrating 0 0 0 0 0  Moving slowly or fidgety/restless 0 0 0 0 0  Suicidal thoughts 0 0 0 0 -  PHQ-9 Score 2 0 7 0 0  Difficult doing work/chores Not difficult at all Not difficult at all Not difficult at all Not difficult at all Not difficult at all  Some recent data might be hidden   PHQ-2/9 Result is positive.    Fall Risk: Fall Risk  12/09/2018 12/02/2018 08/20/2018 07/17/2018 04/10/2018  Falls in the past year? 0 0 0 0 No  Comment - - - - -  Number falls in past yr: 0 0 - 0 -  Injury with Fall? 0 0 - 0 -  Comment - - - - -  Risk for fall due to : - - - - -  Follow up - Falls evaluation completed - - -    Assessment & Plan  1. Buttock wound, left, subsequent encounter  Monitor size, may need to see wound center  She is off methotrexate for now to improve healing, per rheumatologist   2. Diabetes mellitus type 2 in obese [facility])  Keep follow up with Endo   3. Dyslipidemia associated with type 2 diabetes mellitus (HCC)  - pravastatin (PRAVACHOL) 40 MG tablet; Take 1 tablet (40 mg total) by mouth daily.  Dispense: 90 tablet; Refill: 3  4. Dyslipidemia   5. Benign hypertension with chronic kidney disease, stage III ([place])   6. Chronic kidney disease, stage III (moderate) (HCC)  On ARB, keep bp at goal   I discussed the assessment and treatment plan with the patient. The patient was provided an opportunity to ask questions and all were answered. The patient agreed with the plan and demonstrated an understanding of the instructions.  The patient was advised to call back or seek an in-person evaluation if the symptoms worsen or if the condition fails to improve as anticipated.  I provided 25 minutes of non-face-to-face time during this encounter.

## 2018-12-14 ENCOUNTER — Other Ambulatory Visit: Payer: Self-pay | Admitting: Family Medicine

## 2018-12-14 DIAGNOSIS — S31829A Unspecified open wound of left buttock, initial encounter: Secondary | ICD-10-CM

## 2018-12-15 ENCOUNTER — Encounter: Payer: Self-pay | Admitting: Family Medicine

## 2018-12-27 ENCOUNTER — Encounter: Payer: Self-pay | Admitting: Family Medicine

## 2019-01-04 ENCOUNTER — Encounter: Payer: Self-pay | Admitting: Family Medicine

## 2019-01-05 NOTE — Telephone Encounter (Signed)
Please close

## 2019-01-06 ENCOUNTER — Other Ambulatory Visit: Payer: Self-pay | Admitting: Family Medicine

## 2019-01-06 DIAGNOSIS — I10 Essential (primary) hypertension: Secondary | ICD-10-CM

## 2019-01-12 ENCOUNTER — Other Ambulatory Visit: Payer: Self-pay | Admitting: Family Medicine

## 2019-01-12 DIAGNOSIS — I10 Essential (primary) hypertension: Secondary | ICD-10-CM

## 2019-01-14 ENCOUNTER — Other Ambulatory Visit: Payer: Self-pay | Admitting: Family Medicine

## 2019-01-14 DIAGNOSIS — E1169 Type 2 diabetes mellitus with other specified complication: Secondary | ICD-10-CM

## 2019-01-14 DIAGNOSIS — E669 Obesity, unspecified: Secondary | ICD-10-CM

## 2019-01-14 DIAGNOSIS — E1143 Type 2 diabetes mellitus with diabetic autonomic (poly)neuropathy: Secondary | ICD-10-CM

## 2019-02-18 ENCOUNTER — Other Ambulatory Visit: Payer: Self-pay | Admitting: Family Medicine

## 2019-02-18 DIAGNOSIS — M51369 Other intervertebral disc degeneration, lumbar region without mention of lumbar back pain or lower extremity pain: Secondary | ICD-10-CM

## 2019-02-18 DIAGNOSIS — M5136 Other intervertebral disc degeneration, lumbar region: Secondary | ICD-10-CM

## 2019-02-19 NOTE — Telephone Encounter (Signed)
Requested medication (s) are due for refill today: yes  Requested medication (s) are on the active medication list:yes  Last refill: 11/04/2018  Future visit scheduled: no  Notes to clinic: review for refill  Requested Prescriptions  Pending Prescriptions Disp Refills   gabapentin (NEURONTIN) 100 MG capsule [Pharmacy Med Name: GABAPENTIN CAPS 100MG] 180 capsule 3    Sig: TAKE 2 CAPSULES AT BEDTIME     Neurology: Anticonvulsants - gabapentin Passed - 02/18/2019  8:30 PM      Passed - Valid encounter within last 12 months    Recent Outpatient Visits          2 months ago Wound of left buttock, subsequent encounter   [facility] Steele Sizer, MD   2 months ago Diabetes mellitus type 2 in obese [facility])   [place], FNP   6 months ago [place], Satira Anis, MD   7 months ago Sore throat   [facility] [place], Satira Anis, MD   10 months ago Generalized weakness   [place], [place]

## 2019-02-22 NOTE — Telephone Encounter (Signed)
Pt verbally informed that prescriptions have been sent to pharmacy and for her to schedule appt for next month. At this time no appt was scheduled

## 2019-03-04 ENCOUNTER — Encounter: Payer: Self-pay | Admitting: Family Medicine

## 2019-04-06 ENCOUNTER — Other Ambulatory Visit: Payer: Self-pay | Admitting: Family Medicine

## 2019-04-06 DIAGNOSIS — I10 Essential (primary) hypertension: Secondary | ICD-10-CM

## 2019-04-14 ENCOUNTER — Other Ambulatory Visit: Payer: Self-pay | Admitting: Family Medicine

## 2019-04-14 DIAGNOSIS — E1143 Type 2 diabetes mellitus with diabetic autonomic (poly)neuropathy: Secondary | ICD-10-CM

## 2019-04-14 DIAGNOSIS — E669 Obesity, unspecified: Secondary | ICD-10-CM

## 2019-04-14 DIAGNOSIS — E1169 Type 2 diabetes mellitus with other specified complication: Secondary | ICD-10-CM

## 2019-04-19 LAB — BASIC METABOLIC PANEL: Glucose: 113

## 2019-04-19 LAB — LIPID PANEL
Cholesterol: 173 (ref 0–200)
HDL: 60 (ref 35–70)
LDL Cholesterol: 87
Triglycerides: 129 (ref 40–160)

## 2019-04-19 LAB — HEMOGLOBIN A1C: Hemoglobin A1C: 7.3

## 2019-05-25 ENCOUNTER — Other Ambulatory Visit: Payer: Self-pay | Admitting: Family Medicine

## 2019-05-25 DIAGNOSIS — M5136 Other intervertebral disc degeneration, lumbar region: Secondary | ICD-10-CM

## 2019-05-25 DIAGNOSIS — K580 Irritable bowel syndrome with diarrhea: Secondary | ICD-10-CM

## 2019-05-26 NOTE — Telephone Encounter (Signed)
lvm for pt to call back and schedule an appt

## 2019-06-01 ENCOUNTER — Encounter: Payer: Self-pay | Admitting: Podiatry

## 2019-06-01 ENCOUNTER — Other Ambulatory Visit: Payer: Self-pay

## 2019-06-01 ENCOUNTER — Other Ambulatory Visit: Payer: Self-pay | Admitting: Podiatry

## 2019-06-01 ENCOUNTER — Ambulatory Visit (INDEPENDENT_AMBULATORY_CARE_PROVIDER_SITE_OTHER): Payer: Medicare Other | Admitting: Podiatry

## 2019-06-01 ENCOUNTER — Ambulatory Visit: Payer: Medicare Other

## 2019-06-01 ENCOUNTER — Ambulatory Visit (INDEPENDENT_AMBULATORY_CARE_PROVIDER_SITE_OTHER): Payer: Medicare Other

## 2019-06-01 DIAGNOSIS — M7672 Peroneal tendinitis, left leg: Secondary | ICD-10-CM

## 2019-06-01 DIAGNOSIS — M7671 Peroneal tendinitis, right leg: Secondary | ICD-10-CM | POA: Diagnosis not present

## 2019-06-01 DIAGNOSIS — M778 Other enthesopathies, not elsewhere classified: Secondary | ICD-10-CM

## 2019-06-04 NOTE — Progress Notes (Signed)
   HPI: 73 y.o. female presenting today with a chief complaint of sharp pain to the right lateral foot that began about four weeks ago. She reports some associated swelling. Bearing weight increases the pain. She is currently taking Prednisone for RA prescribed by her Rheumatologist. She states the symptoms are improving. Patient is here for further evaluation and treatment.   Past Medical History:  Diagnosis Date  . Achilles tendinitis of right lower extremity   . Allergy   . Asthma    Moderate  . Chronic back pain   . Depression   . Gastroparesis    DM  . Hyperlipidemia   . Hypertension   . Hypoxemia   . Irritable bowel syndrome   . Maculopathy   . NASH (nonalcoholic steatohepatitis)   . Nuclear sclerosis of both eyes   . OA (osteoarthritis)    Hips  . OSA (obstructive sleep apnea)   . Post-void dribbling   . Psoriasis   . Regurgitation   . SOB (shortness of breath)   . Tear of left rotator cuff   . TMJ (dislocation of temporomandibular joint)   . Trochanteric bursitis of right hip      Physical Exam: General: The patient is alert and oriented x3 in no acute distress.  Dermatology: Skin is warm, dry and supple bilateral lower extremities. Negative for open lesions or macerations.  Vascular: Palpable pedal pulses bilaterally. No edema or erythema noted. Capillary refill within normal limits.  Neurological: Epicritic and protective threshold grossly intact bilaterally.   Musculoskeletal Exam: Pain with palpation to the insertion of the peroneal tendon of the right foot. Range of motion within normal limits to all pedal and ankle joints bilateral. Muscle strength 5/5 in all groups bilateral.   Radiographic Exam:  Normal osseous mineralization. Joint spaces preserved. No fracture/dislocation/boney destruction.    Assessment: 1. Insertional peroneal tendinitis right - improving    Plan of Care:  1. Patient evaluated. X-Rays reviewed.  2. Continue taking Methotrexate  and Prednisone as directed by Rheumatologist.  3. Declined CAM boot and injection.  4. Recommended good shoe gear.  5. Return to clinic as needed.       Edrick Kins, DPM [facility]  Dr. Edrick Kins, DPM    [address], [place] [address]                Office [phone]  Fax [phone]

## 2019-06-07 ENCOUNTER — Encounter: Payer: Self-pay | Admitting: Family Medicine

## 2019-06-07 LAB — BASIC METABOLIC PANEL
BUN: 17 (ref 4–21)
Chloride: 101 (ref 99–108)
Creatinine: 0.9 (ref 0.5–1.1)
Glucose: 158
Potassium: 3.7 (ref 3.4–5.3)
Sodium: 141 (ref 137–147)

## 2019-06-07 LAB — HEPATIC FUNCTION PANEL
ALT: 20 (ref 7–35)
AST: 21 (ref 13–35)
Alkaline Phosphatase: 95 (ref 25–125)

## 2019-06-07 LAB — CBC: RBC: 4.17 (ref 3.87–5.11)

## 2019-06-07 LAB — CBC AND DIFFERENTIAL
HCT: 39 (ref 36–46)
Hemoglobin: 13.6 (ref 12.0–16.0)
Neutrophils Absolute: 7
Platelets: 321 (ref 150–399)
WBC: 9.5

## 2019-06-08 LAB — CBC AND DIFFERENTIAL
HCT: 39 (ref 36–46)
Hemoglobin: 13.6 (ref 12.0–16.0)
WBC: 9.5

## 2019-06-08 LAB — BASIC METABOLIC PANEL: Glucose: 158

## 2019-06-10 ENCOUNTER — Encounter: Payer: Self-pay | Admitting: Podiatry

## 2019-06-28 ENCOUNTER — Encounter: Payer: Self-pay | Admitting: Family Medicine

## 2019-06-29 ENCOUNTER — Encounter: Payer: Self-pay | Admitting: Family Medicine

## 2019-06-30 ENCOUNTER — Encounter: Payer: Self-pay | Admitting: Family Medicine

## 2019-07-05 ENCOUNTER — Other Ambulatory Visit: Payer: Self-pay | Admitting: Family Medicine

## 2019-07-05 DIAGNOSIS — I10 Essential (primary) hypertension: Secondary | ICD-10-CM

## 2019-07-12 NOTE — Telephone Encounter (Signed)
My notes state that it is the right foot. I have no idea where she is seeing left. - Dr. Amalia Hailey

## 2019-07-13 ENCOUNTER — Other Ambulatory Visit: Payer: Self-pay | Admitting: Family Medicine

## 2019-07-13 ENCOUNTER — Encounter: Payer: Self-pay | Admitting: Family Medicine

## 2019-07-13 DIAGNOSIS — E1143 Type 2 diabetes mellitus with diabetic autonomic (poly)neuropathy: Secondary | ICD-10-CM

## 2019-07-13 DIAGNOSIS — E669 Obesity, unspecified: Secondary | ICD-10-CM

## 2019-07-13 DIAGNOSIS — E1169 Type 2 diabetes mellitus with other specified complication: Secondary | ICD-10-CM

## 2019-07-13 NOTE — Telephone Encounter (Signed)
Requested Prescriptions  Pending Prescriptions Disp Refills  . metFORMIN (GLUCOPHAGE-XR) 500 MG 24 hr tablet [Pharmacy Med Name: METFORMIN HCL ER TABS 500MG] 270 tablet 3    Sig: TAKE 3 TABLETS (1500 MG) DAILY     Endocrinology:  Diabetes - Biguanides Failed - 07/13/2019 12:51 AM      Failed - HBA1C is between 0 and 7.9 and within 180 days    Hemoglobin A1C  Date Value Ref Range Status  08/19/2018 7.4  Final         Failed - eGFR in normal range and within 360 days    GFR, Est African American  Date Value Ref Range Status  04/06/2018 66 > OR = 60 mL/min/1.2m Final   GFR, Est Non African American  Date Value Ref Range Status  04/06/2018 57 (L) > OR = 60 mL/min/1.730mFinal         Failed - Valid encounter within last 6 months    Recent Outpatient Visits          7 months ago [facility] left buttock, [facility]   7 months ago [facility] type 2 [facility]   [facility]   12 months ago [facility]ore [facility]   1 year ago Generalized weakness   [facility]           Passed - Cr in normal range and within 360 days    Creatinine  Date Value Ref Range Status  06/07/2019 0.9 0.5 - 1.1 Final   Creat  Date Value Ref Range Status  04/06/2018 0.98 (H) 0.60 - 0.93 mg/dL Final    Comment:    For patients >497ears of age, the reference limit for Creatinine is approximately 13% higher for people identified as African-American. .    Creatinine, Urine  Date Value Ref Range Status  12/04/2017 75 20 - 275 mg/dL Final

## 2019-07-15 ENCOUNTER — Encounter: Payer: Self-pay | Admitting: Family Medicine

## 2019-07-16 ENCOUNTER — Other Ambulatory Visit: Payer: Self-pay

## 2019-07-19 ENCOUNTER — Telehealth: Payer: Self-pay | Admitting: Family Medicine

## 2019-07-19 NOTE — Telephone Encounter (Signed)
[facility] called checking if medication  metFORMIN (GLUCOPHAGE-XR) 500 MG 24 hr tablet  Can be refilled due to pharmacist believes she may be allergic.    Call back [phone] Ref # [id number]

## 2019-07-20 NOTE — Telephone Encounter (Signed)
Spoke with [facility] and informed them per Dr.Sowles patient has been taking Metformin since 10/2014 and tolerates the Extended Release but not the instant releases due to the coating of the pill. Please fill for patient.

## 2019-07-23 ENCOUNTER — Encounter: Payer: Self-pay | Admitting: Family Medicine

## 2019-07-23 ENCOUNTER — Ambulatory Visit (INDEPENDENT_AMBULATORY_CARE_PROVIDER_SITE_OTHER): Payer: Medicare Other

## 2019-07-23 VITALS — BP 114/49 | HR 92 | Temp 97.7°F | Ht 66.0 in | Wt 186.0 lb

## 2019-07-23 DIAGNOSIS — Z0000 Encounter for general adult medical examination without abnormal findings: Secondary | ICD-10-CM

## 2019-07-23 NOTE — Patient Instructions (Signed)
Mary Cantrell , Thank you for taking time to come for your Medicare Wellness Visit. I appreciate your ongoing commitment to your health goals. Please review the following plan we discussed and let me know if I can assist you in the future.   Screening recommendations/referrals: Colonoscopy: Cologuard done 12/16/17 Mammogram: done 05/19/18 Bone Density: done 05/19/18 Recommended yearly ophthalmology/optometry visit for glaucoma screening and checkup Recommended yearly dental visit for hygiene and checkup  Vaccinations: Influenza vaccine: done 03/03/19 Pneumococcal vaccine: done 04/27/15 Tdap vaccine: done 01/07/17 Shingles vaccine: Shingrix discussed. Please contact your pharmacy for coverage information.   Advanced directives: Please bring a copy of your health care power of attorney and living will to the office at your convenience.  Conditions/risks identified: Recommend increasing physical activity as tolerated.   Next appointment: Please follow up in one year for your Medicare Annual Wellness visit.     Preventive Care 75 Years and Older, Female Preventive care refers to lifestyle choices and visits with your health care provider that can promote health and wellness. What does preventive care include?  A yearly physical exam. This is also called an annual well check.  Dental exams once or twice a year.  Routine eye exams. Ask your health care provider how often you should have your eyes checked.  Personal lifestyle choices, including:  Daily care of your teeth and gums.  Regular physical activity.  Eating a healthy diet.  Avoiding tobacco and drug use.  Limiting alcohol use.  Practicing safe sex.  Taking low-dose aspirin every day.  Taking vitamin and mineral supplements as recommended by your health care provider. What happens during an annual well check? The services and screenings done by your health care provider during your annual well check will depend on your  age, overall health, lifestyle risk factors, and family history of disease. Counseling  Your health care provider may ask you questions about your:  Alcohol use.  Tobacco use.  Drug use.  Emotional well-being.  Home and relationship well-being.  Sexual activity.  Eating habits.  History of falls.  Memory and ability to understand (cognition).  Work and work Statistician.  Reproductive health. Screening  You may have the following tests or measurements:  Height, weight, and BMI.  Blood pressure.  Lipid and cholesterol levels. These may be checked every 5 years, or more frequently if you are over 50 years old.  Skin check.  Lung cancer screening. You may have this screening every year starting at age 75 if you have a 30-pack-year history of smoking and currently smoke or have quit within the past 15 years.  Fecal occult blood test (FOBT) of the stool. You may have this test every year starting at age 75.  Flexible sigmoidoscopy or colonoscopy. You may have a sigmoidoscopy every 5 years or a colonoscopy every 10 years starting at age 75.  Hepatitis C blood test.  Hepatitis B blood test.  Sexually transmitted disease (STD) testing.  Diabetes screening. This is done by checking your blood sugar (glucose) after you have not eaten for a while (fasting). You may have this done every 1-3 years.  Bone density scan. This is done to screen for osteoporosis. You may have this done starting at age 75.  Mammogram. This may be done every 1-2 years. Talk to your health care provider about how often you should have regular mammograms. Talk with your health care provider about your test results, treatment options, and if necessary, the need for more tests. Vaccines  Your  health care provider may recommend certain vaccines, such as:  Influenza vaccine. This is recommended every year.  Tetanus, diphtheria, and acellular pertussis (Tdap, Td) vaccine. You may need a Td booster  every 10 years.  Zoster vaccine. You may need this after age 40.  Pneumococcal 13-valent conjugate (PCV13) vaccine. One dose is recommended after age 75.  Pneumococcal polysaccharide (PPSV23) vaccine. One dose is recommended after age 75. Talk to your health care provider about which screenings and vaccines you need and how often you need them. This information is not intended to replace advice given to you by your health care provider. Make sure you discuss any questions you have with your health care provider. Document Released: 07/07/2015 Document Revised: 02/28/2016 Document Reviewed: 04/11/2015 Elsevier Interactive Patient Education  2017 [employer] Prevention in the Home Falls can cause injuries. They can happen to people of all ages. There are many things you can do to make your home safe and to help prevent falls. What can I do on the outside of my home?  Regularly fix the edges of walkways and driveways and fix any cracks.  Remove anything that might make you trip as you walk through a door, such as a raised step or threshold.  Trim any bushes or trees on the path to your home.  Use bright outdoor lighting.  Clear any walking paths of anything that might make someone trip, such as rocks or tools.  Regularly check to see if handrails are loose or broken. Make sure that both sides of any steps have handrails.  Any raised decks and porches should have guardrails on the edges.  Have any leaves, snow, or ice cleared regularly.  Use sand or salt on walking paths during winter.  Clean up any spills in your garage right away. This includes oil or grease spills. What can I do in the bathroom?  Use night lights.  Install grab bars by the toilet and in the tub and shower. Do not use towel bars as grab bars.  Use non-skid mats or decals in the tub or shower.  If you need to sit down in the shower, use a plastic, non-slip stool.  Keep the floor dry. Clean up any  water that spills on the floor as soon as it happens.  Remove soap buildup in the tub or shower regularly.  Attach bath mats securely with double-sided non-slip rug tape.  Do not have throw rugs and other things on the floor that can make you trip. What can I do in the bedroom?  Use night lights.  Make sure that you have a light by your bed that is easy to reach.  Do not use any sheets or blankets that are too big for your bed. They should not hang down onto the floor.  Have a firm chair that has side arms. You can use this for support while you get dressed.  Do not have throw rugs and other things on the floor that can make you trip. What can I do in the kitchen?  Clean up any spills right away.  Avoid walking on wet floors.  Keep items that you use a lot in easy-to-reach places.  If you need to reach something above you, use a strong step stool that has a grab bar.  Keep electrical cords out of the way.  Do not use floor polish or wax that makes floors slippery. If you must use wax, use non-skid floor wax.  Do not  have throw rugs and other things on the floor that can make you trip. What can I do with my stairs?  Do not leave any items on the stairs.  Make sure that there are handrails on both sides of the stairs and use them. Fix handrails that are broken or loose. Make sure that handrails are as long as the stairways.  Check any carpeting to make sure that it is firmly attached to the stairs. Fix any carpet that is loose or worn.  Avoid having throw rugs at the top or bottom of the stairs. If you do have throw rugs, attach them to the floor with carpet tape.  Make sure that you have a light switch at the top of the stairs and the bottom of the stairs. If you do not have them, ask someone to add them for you. What else can I do to help prevent falls?  Wear shoes that:  Do not have high heels.  Have rubber bottoms.  Are comfortable and fit you well.  Are closed  at the toe. Do not wear sandals.  If you use a stepladder:  Make sure that it is fully opened. Do not climb a closed stepladder.  Make sure that both sides of the stepladder are locked into place.  Ask someone to hold it for you, if possible.  Clearly mark and make sure that you can see:  Any grab bars or handrails.  First and last steps.  Where the edge of each step is.  Use tools that help you move around (mobility aids) if they are needed. These include:  Canes.  Walkers.  Scooters.  Crutches.  Turn on the lights when you go into a dark area. Replace any light bulbs as soon as they burn out.  Set up your furniture so you have a clear path. Avoid moving your furniture around.  If any of your floors are uneven, fix them.  If there are any pets around you, be aware of where they are.  Review your medicines with your doctor. Some medicines can make you feel dizzy. This can increase your chance of falling. Ask your doctor what other things that you can do to help prevent falls. This information is not intended to replace advice given to you by your health care provider. Make sure you discuss any questions you have with your health care provider. Document Released: 04/06/2009 Document Revised: 11/16/2015 Document Reviewed: 07/15/2014 Elsevier Interactive Patient Education  2017 [employer].

## 2019-07-23 NOTE — Progress Notes (Signed)
Subjective:   Mary Cantrell is a 74 y.o. female who presents for Medicare Annual (Subsequent) preventive examination.  Virtual Visit via Telephone Note  I connected with Mary Cantrell on 07/23/19 at 11:20 AM EST by telephone and verified that I am speaking with the correct person using two identifiers.  Medicare Annual Wellness visit completed telephonically due to Covid-19 pandemic.   Location: Patient: home Provider: office   I discussed the limitations, risks, security and privacy concerns of performing an evaluation and management service by telephone and the availability of in person appointments. The patient expressed understanding and agreed to proceed.  Some vital signs may be absent or patient reported.   Clemetine Marker, LPN    Review of Systems:   Cardiac Risk Factors include: diabetes mellitus;dyslipidemia;hypertension;advanced age (>92mn, >>73women);obesity (BMI >30kg/m2)     Objective:     Vitals: BP (!) 114/49   Pulse 92   Temp 97.7 F (36.5 C)   Ht 5' 6"  (1.676 m)   Wt 186 lb (84.4 kg)   SpO2 93%   BMI 30.02 kg/m   Body mass index is 30.02 kg/m.  Advanced Directives 07/23/2019 12/31/2016 10/28/2016 10/10/2016 06/26/2016 03/13/2016 10/31/2015  Does Patient Have a Medical Advance Directive? Yes No No Yes Yes Yes No  Type of [place] - - Living will [place] will [place] will -  Does patient want to make changes to medical advance directive? - - - - - Yes - information given -  Copy of [place]? No - copy requested - - - No - copy requested No - copy requested -  Would patient like information on creating a medical advance directive? - No - Patient declined - - - - No - patient declined information    Tobacco Social History   Tobacco Use  Smoking Status Never Smoker  Smokeless Tobacco Never Used     Counseling given: Not  Answered   Clinical Intake:  Pre-visit preparation completed: Yes  Pain : 0-10 Pain Score: 4  Pain Type: Chronic pain Pain Location: Foot Pain Orientation: Right Pain Descriptors / Indicators: Aching Pain Onset: More than a month ago Pain Frequency: Constant     BMI - recorded: 30.02 Nutritional Status: BMI > 30  Obese Nutritional Risks: None Diabetes: Yes CBG done?: No Did pt. bring in CBG monitor from home?: No   Nutrition Risk Assessment:  Has the patient had any N/V/D within the last 2 months?  No  Does the patient have any non-healing wounds?  No  Has the patient had any unintentional weight loss or weight gain?  No   Diabetes:  Is the patient diabetic?  Yes  If diabetic, was a CBG obtained today?  No  Did the patient bring in their glucometer from home?  No  How often do you monitor your CBG's? Daily .   Financial Strains and Diabetes Management:  Are you having any financial strains with the device, your supplies or your medication? No .  Does the patient want to be seen by Chronic Care Management for management of their diabetes?  No  Would the patient like to be referred to a Nutritionist or for Diabetic Management?  No   Diabetic Exams:  Diabetic Eye Exam: Completed 12/09/17. Overdue for diabetic eye exam. Pt scheduled for appt for diabetic eye exam next week.   Diabetic Foot Exam: Completed 08/20/18.   How often  do you need to have someone help you when you read instructions, pamphlets, or other written materials from your doctor or pharmacy?: 1 - Never  Interpreter Needed?: No  Information entered by :: Clemetine Marker LPN  Past Medical History:  Diagnosis Date  . Achilles tendinitis of right lower extremity   . Allergy   . Asthma    Moderate  . Chronic back pain   . Depression   . Gastroparesis    DM  . Hyperlipidemia   . Hypertension   . Hypoxemia   . Irritable bowel syndrome   . Maculopathy   . NASH (nonalcoholic steatohepatitis)   .  Nuclear sclerosis of both eyes   . OA (osteoarthritis)    Hips  . OSA (obstructive sleep apnea)   . Post-void dribbling   . Psoriasis   . Regurgitation   . SOB (shortness of breath)   . Tear of left rotator cuff   . TMJ (dislocation of temporomandibular joint)   . Trochanteric bursitis of right hip    Past Surgical History:  Procedure Laterality Date  . ABDOMINAL HYSTERECTOMY    . APPENDECTOMY    . ARTERY BIOPSY N/A 02/23/2015   Procedure: BIOPSY TEMPORAL ARTERY;  Surgeon: Algernon Huxley, MD;  Location: [facility] ORS;  Service: Vascular;  Laterality: N/A;  . BREAST BIOPSY Left   . EXCISION / CURETTAGE BONE CYST FINGER     Left Index Finger  . PILONIDAL CYST EXCISION    . SHOULDER ARTHROSCOPY Right [id number]   Dr. Mauri Pole  . STOMACH SURGERY  1941   3 arteries leaking in stomach after appendectomy  . TONSILLECTOMY     Family History  Problem Relation Age of Onset  . Rheum arthritis Father   . Diabetes Father   . Pneumonia Father   . Pneumonia Mother   . Diabetes Sister   . Osteoarthritis Sister    Social History   Socioeconomic History  . Marital status: Married    Spouse name: Not on file  . Number of children: 2  . Years of education: Not on file  . Highest education level: Not on file  Occupational History  . Occupation: retired  Tobacco Use  . Smoking status: Never Smoker  . Smokeless tobacco: Never Used  Substance and Sexual Activity  . Alcohol use: No    Alcohol/week: 0.0 standard drinks  . Drug use: No  . Sexual activity: Yes    Partners: Male    Comment: hysterectomy  Other Topics Concern  . Not on file  Social History Narrative  . Not on file   Social Determinants of Health   Financial Resource Strain: Low Risk   . Difficulty of Paying Living Expenses: Not hard at all  Food Insecurity: No Food Insecurity  . Worried About Charity fundraiser in the Last Year: Never true  . Ran Out of Food in the Last Year: Never true  Transportation Needs: No  Transportation Needs  . Lack of Transportation (Medical): No  . Lack of Transportation (Non-Medical): No  Physical Activity: Inactive  . Days of Exercise per Week: 0 days  . Minutes of Exercise per Session: 0 min  Stress: No Stress Concern Present  . Feeling of Stress : Not at all  Social Connections: Somewhat Isolated  . Frequency of Communication with Friends and Family: More than three times a week  . Frequency of Social Gatherings with Friends and Family: Never  . Attends Religious Services: Never  . Active Member  of Clubs or Organizations: No  . Attends Archivist Meetings: Never  . Marital Status: Married    Outpatient Encounter Medications as of 07/23/2019  Medication Sig  . albuterol (VENTOLIN HFA) 108 (90 BASE) MCG/ACT inhaler Inhale 2 puffs into the lungs every 4 (four) hours as needed for wheezing (takes every am and then as needed.).   Marland Kitchen aspirin 81 MG tablet Take 81 mg by mouth every morning.   . calcipotriene-betamethasone (TACLONEX) ointment Apply topically daily.  . Calcium Carbonate-Vit D-Min ([employer] CALCIUM 600 + D/MINERALS) 600-400 MG-UNIT TABS Take 1 tablet by mouth daily.  . cholecalciferol (VITAMIN D3) 25 MCG (1000 UNIT) tablet Take 1,000 Units by mouth daily.  . Cinnamon 500 MG capsule Take 1 capsule by mouth 2 (two) times daily.  . cyanocobalamin 100 MCG tablet Take 100 mcg by mouth once a week.   . dicyclomine (BENTYL) 20 MG tablet TAKE 1 TABLET FOUR TIMES A DAY BEFORE MEALS AND AT BEDTIME  . diphenhydrAMINE (SOMINEX) 25 MG tablet Take 2 tablets by mouth at bedtime.  . fluticasone (FLONASE) 50 MCG/ACT nasal spray Place 2 sprays into both nostrils at bedtime.  . folic acid (FOLVITE) 1 MG tablet   . gabapentin (NEURONTIN) 100 MG capsule TAKE 2 CAPSULES AT BEDTIME  . glipiZIDE (GLUCOTROL XL) 2.5 MG 24 hr tablet Take 1 tablet by mouth every morning.  Marland Kitchen glipiZIDE (GLUCOTROL XL) 5 MG 24 hr tablet Take 1 tablet by mouth every morning.  . metFORMIN  (GLUCOPHAGE-XR) 500 MG 24 hr tablet TAKE 3 TABLETS (1500 MG) DAILY  . methotrexate (RHEUMATREX) 2.5 MG tablet Take 6 tablets (15 mg total) by mouth once a week. Caution:Chemotherapy. Protect from light.  . Multiple Vitamins-Minerals (MULTIVITAMIN ADULTS PO) Take by mouth.  . olmesartan (BENICAR) 20 MG tablet TAKE 1 TABLET DAILY  . ONE TOUCH ULTRA TEST test strip TEST 2 (TWO) TIMES DAILY. DX: E11.69, E11.43. *INS.ONLY PAYS FOR ONCE DAILY TESTING*  . pravastatin (PRAVACHOL) 40 MG tablet Take 1 tablet (40 mg total) by mouth daily.  . predniSONE (DELTASONE) 2.5 MG tablet Take 7.5 mg by mouth daily.  Marland Kitchen Propylene Glycol (SYSTANE BALANCE OP) Apply to eye.  . spironolactone-hydrochlorothiazide (ALDACTAZIDE) 25-25 MG tablet TAKE ONE-HALF (1/2) TABLET DAILY  . vitamin C (ASCORBIC ACID) 500 MG tablet Take 1 tablet by mouth at bedtime.   . budesonide-formoterol (SYMBICORT) 160-4.5 MCG/ACT inhaler Inhale 2 puffs into the lungs every morning.  . [DISCONTINUED] Azelastine HCl 0.15 % SOLN Place 1 spray into the nose daily.   . [DISCONTINUED] gentamicin cream (GARAMYCIN) 0.1 % Apply 1 application topically 3 (three) times daily. (Patient taking differently: Apply 1 application topically as needed. )  . [DISCONTINUED] glipiZIDE (GLUCOTROL) 5 MG tablet Take 5 mg by mouth daily before breakfast.  . [DISCONTINUED] montelukast (SINGULAIR) 10 MG tablet Take by mouth.  . [DISCONTINUED] mupirocin ointment (BACTROBAN) 2 % PLACE 1 APPLICATION INTO THE NOSE 2 (TWO) TIMES DAILY.  . [DISCONTINUED] neomycin-polymyxin-hydrocortisone (CORTISPORIN) 3.5-10000-1 ophthalmic suspension Place 2 drops into both eyes 4 (four) times daily. (Patient taking differently: Place 2 drops into both eyes as needed. )  . [DISCONTINUED] nystatin (MYCOSTATIN) 100000 UNIT/ML suspension Take 5 mLs (500,000 Units total) by mouth 4 (four) times daily. Rinse around mouth, and then swallow; 7-10 days until symptoms are gone  . [DISCONTINUED] omeprazole  (PRILOSEC) 40 MG capsule Take 40 mg by mouth daily.  . [DISCONTINUED] tiZANidine (ZANAFLEX) 4 MG capsule Take 4 mg by mouth 3 (three) times daily as needed  for muscle spasms.   No facility-administered encounter medications on file as of 07/23/2019.    Activities of Daily Living In your present state of health, do you have any difficulty performing the following activities: 07/23/2019 12/09/2018  Hearing? Y N  Comment has hearing aids -  Vision? Y N  Difficulty concentrating or making decisions? N N  Walking or climbing stairs? Y Y  Dressing or bathing? N N  Doing errands, shopping? N N  Preparing Food and eating ? N -  Using the Toilet? N -  In the past six months, have you accidently leaked urine? N -  Do you have problems with loss of bowel control? N -  Managing your Medications? N -  Managing your Finances? N -  Housekeeping or managing your Housekeeping? N -  Some recent data might be hidden    Patient Care Team: Steele Sizer, MD as PCP - General (Family Medicine) Valinda Party, MD as Consulting Physician (Rheumatology) Erby Pian, MD as Referring Physician (Specialist) Samara Deist, DPM as Referring Physician (Podiatry) Lucky Cowboy, Erskine Squibb, MD as Referring Physician (Vascular Surgery) Clyde Canterbury, MD as Referring Physician (Otolaryngology) Birder Robson, MD as Referring Physician (Ophthalmology)    Assessment:   This is a routine wellness examination for Mary Cantrell.  Exercise Activities and Dietary recommendations Current Exercise Habits: The patient does not participate in regular exercise at present, Exercise limited by: orthopedic condition(s)  Goals   None     Fall Risk Fall Risk  07/23/2019 12/09/2018 12/02/2018 08/20/2018 07/17/2018  Falls in the past year? 0 0 0 0 0  Comment - - - - -  Number falls in past yr: 0 0 0 - 0  Injury with Fall? 0 0 0 - 0  Comment - - - - -  Risk for fall due to : No Fall Risks - - - -  Follow up Falls prevention discussed  - Falls evaluation completed - -   FALL RISK PREVENTION PERTAINING TO THE HOME:  Any stairs in or around the home? Yes If so, do they handrails? Yes  Home free of loose throw rugs in walkways, pet beds, electrical cords, etc? Yes  Adequate lighting in your home to reduce risk of falls? Yes   ASSISTIVE DEVICES UTILIZED TO PREVENT FALLS:  Life alert? No  Use of a cane, walker or w/c? Yes  Grab bars in the bathroom? Yes  Shower chair or bench in shower? Yes  Elevated toilet seat or a handicapped toilet? No   DME ORDERS:  DME order needed?  No   TIMED UP AND GO:  Was the test performed? No . Telephonic visit  Education: Fall risk prevention has been discussed.  Intervention(s) required? No   Depression Screen PHQ 2/9 Scores 07/23/2019 12/09/2018 12/02/2018 08/20/2018  PHQ - 2 Score 0 2 0 2  PHQ- 9 Score - 2 0 7     Cognitive Function 6CIT deferred for 2021 AWV; pt has no memory issues.         Immunization History  Administered Date(s) Administered  . Influenza, High Dose Seasonal PF 04/13/2015, 03/13/2016, 03/28/2017, 03/18/2018, 03/03/2019  . Influenza, Seasonal, Injecte, Preservative Fre 02/28/2011, 03/12/2012, 03/31/2013, 03/09/2014  . Influenza-Unspecified 02/22/2014, 03/09/2014, 03/03/2019  . Pneumococcal Conjugate-13 04/27/2015  . Pneumococcal Polysaccharide-23 01/15/2010  . Td 01/15/2010  . Tdap 01/07/2017  . Zoster 06/25/2007    Qualifies for Shingles Vaccine? Yes  Zostavax completed 2009. Due for Shingrix. Education has been provided regarding the importance of  this vaccine. Pt has been advised to call insurance company to determine out of pocket expense. Advised may also receive vaccine at local pharmacy or Health Dept. Verbalized acceptance and understanding.  Tdap: Up to date  Flu Vaccine: Up to date  Pneumococcal Vaccine: Up to date   Screening Tests Health Maintenance  Topic Date Due  . OPHTHALMOLOGY EXAM  04/02/2017  . HEMOGLOBIN A1C   02/17/2019  . FOOT EXAM  08/21/2019  . MAMMOGRAM  05/19/2020  . TETANUS/TDAP  01/08/2027  . COLONOSCOPY  12/17/2027  . INFLUENZA VACCINE  Completed  . DEXA SCAN  Completed  . Hepatitis C Screening  Completed  . PNA vac Low Risk Adult  Completed    Cancer Screenings:  Colorectal Screening: Cologuard completed 12/16/17. Repeat every 3 years;   Mammogram: Completed 05/19/18. Repeat every year. Pt requests to postpone due to Covid.   Bone Density: Completed 05/19/18. Results reflect NORMAL. Repeat every 2 years.   Lung Cancer Screening: (Low Dose CT Chest recommended if Age 35-80 years, 30 pack-year currently smoking OR have quit w/in 15years.) does not qualify.   Additional Screening:  Hepatitis C Screening: does qualify; Completed 04/22/14  Vision Screening: Recommended annual ophthalmology exams for early detection of glaucoma and other disorders of the eye. Is the patient up to date with their annual eye exam?  Yes  Who is the provider or what is the name of the office in which the pt attends annual eye exams? [place] Screening: Recommended annual dental exams for proper oral hygiene  Community Resource Referral:  CRR required this visit?  No      Plan:     I have personally reviewed and addressed the Medicare Annual Wellness questionnaire and have noted the following in the patient's chart:  A. Medical and social history B. Use of alcohol, tobacco or illicit drugs  C. Current medications and supplements D. Functional ability and status E.  Nutritional status F.  Physical activity G. Advance directives H. List of other physicians I.  Hospitalizations, surgeries, and ER visits in previous 12 months J.  [place] such as hearing and vision if needed, cognitive and depression L. Referrals and appointments   In addition, I have reviewed and discussed with patient certain preventive protocols, quality metrics, and best practice  recommendations. A written personalized care plan for preventive services as well as general preventive health recommendations were provided to patient.   Signed,  Clemetine Marker, LPN Nurse Health Advisor   Nurse Notes: pt c/o episode of elevated blood sugar 2 days ago and blurred vision. Pt seen by eye dr yesterday and advised to use systane drops for dry eyes. She has diabetic eye appt next week.   Pt also stated she has hairline fracture in right foot that is healing slowly and she declined use of orthopaedic boot. Pt has virtual f/u with Dr. Ancil Boozer on Monday 07/26/19.

## 2019-07-26 ENCOUNTER — Ambulatory Visit (INDEPENDENT_AMBULATORY_CARE_PROVIDER_SITE_OTHER): Payer: Medicare Other | Admitting: Family Medicine

## 2019-07-26 ENCOUNTER — Encounter: Payer: Self-pay | Admitting: Family Medicine

## 2019-07-26 VITALS — BP 113/62 | HR 92 | Temp 97.7°F | Ht 66.0 in | Wt 186.0 lb

## 2019-07-26 DIAGNOSIS — E785 Hyperlipidemia, unspecified: Secondary | ICD-10-CM

## 2019-07-26 DIAGNOSIS — M5136 Other intervertebral disc degeneration, lumbar region: Secondary | ICD-10-CM

## 2019-07-26 DIAGNOSIS — E1169 Type 2 diabetes mellitus with other specified complication: Secondary | ICD-10-CM | POA: Diagnosis not present

## 2019-07-26 DIAGNOSIS — Z7952 Long term (current) use of systemic steroids: Secondary | ICD-10-CM

## 2019-07-26 DIAGNOSIS — I10 Essential (primary) hypertension: Secondary | ICD-10-CM

## 2019-07-26 DIAGNOSIS — G4733 Obstructive sleep apnea (adult) (pediatric): Secondary | ICD-10-CM

## 2019-07-26 DIAGNOSIS — Z1231 Encounter for screening mammogram for malignant neoplasm of breast: Secondary | ICD-10-CM

## 2019-07-26 DIAGNOSIS — S92902D Unspecified fracture of left foot, subsequent encounter for fracture with routine healing: Secondary | ICD-10-CM

## 2019-07-26 MED ORDER — PRAVASTATIN SODIUM 80 MG PO TABS: 80.0000 mg | ORAL_TABLET | Freq: Every day | ORAL | 1 refills | Status: DC

## 2019-07-26 MED ORDER — GABAPENTIN 100 MG PO CAPS: 200.0000 mg | ORAL_CAPSULE | Freq: Every day | ORAL | 1 refills | Status: DC

## 2019-07-26 MED ORDER — OLMESARTAN MEDOXOMIL 20 MG PO TABS: 20.0000 mg | ORAL_TABLET | Freq: Every day | ORAL | 0 refills | Status: DC

## 2019-07-26 NOTE — Progress Notes (Signed)
Name: Mary Cantrell   MRN: [id number]    DOB: 1945-11-07   Date:07/26/2019       Progress Note  Subjective  Chief Complaint  Chief Complaint  Patient presents with  . Medication Refill    Going to get [name] on Wednesday  . Diabetes    Sees [facility] and Dr. Amalia Hailey for her Podiatrist.   . Psoriasis  . Hypertension    Denies any symptoms  . Asthma    Sees Pulmonologist Dr. Leane Platt  . Hyperlipidemia  . Irritable Bowel Syndrome  . Foot Injury    Fractured her right foot on June 01, 2019 and seeing Dr. Amalia Hailey    I connected with  Mary Cantrell  on 07/26/19 at  2:20 PM EST by telephone encounter I discussed the limitations of evaluation and management by telemedicine and the availability of in person appointments. The patient expressed understanding and agreed to proceed. Staff also discussed with the patient that there may be a patient responsible charge related to this service. Patient Location: at home  Provider Location: [facility]   HPI  DMII: under the care of endocrinologist - Dr. Honor Junes  , last A1C was 7.3% and urine micro negative  done 03/2019 . She denies polyphagia, polydipsia or polyuria, she is compliant with medication. Taking Metformin and glipizide . Last GFR was back to normal.   OSA: on CPAP most nights, she denies day time somnolence or headaches  Hyperlipidemia: reviewed labs done by Endo 04/19/2019 showed LDL 87, HDL 60.4. She is on Pravastatin , LDL goal is below 70. Discussed changing therapy , she is willing to go up to 80 mg  Mild Intermittent Asthma: under the care of Dr. Raul Del , she is on Symbicort and albuterol prn, she has stable SOB with activity and likely multifactorial. Immunizations up to date  Hypertension with CKI stage III: discussed lab results with her, bp at home usually at goal, no chest pain or palpitation . She is on ARB  last urine micro was normal   Psoriasis: on her elbows, uses topical  medication prn   PMR: still under the care of Rheumatologist, but cannot see notes. She also has OA also psoriatic arthritis and is on methotrexate   Intermittent left eye changes: she noticed some blurred vision on left eye , she called ophthalmologist saw eye doctor the following day, she will have an retinal exam this week. Vision today is back to normal. The symptoms lasted about 5 days. It happened a couple of years ago. She states glucose was high when the episode first started . Eye doctors said it may have been dry eye   Left foot fracture: she developed pain on left lateral foot, after weeks she decided to see podiatrist and has a hair line fracture, still has pain, using a cane to walk, but not using a boot at this time, advised bone density test  Patient Active Problem List   Diagnosis Date Noted  . Chronic kidney disease, stage III (moderate) 12/09/2018  . Dyslipidemia associated with type 2 diabetes mellitus ([place]) 12/09/2018  . Obesity (BMI 30.0-34.9) 07/17/2018  . Hypertension associated with stage 2 chronic kidney disease due to type 2 diabetes mellitus ([place]) 03/15/2018  . Psoriatic arthritis ([place]) 12/04/2017  . Stenosis of left carotid artery 12/16/2016  . Polymyalgia rheumatica ([place]) 04/13/2015  . Benign hypertension with chronic kidney disease, stage III ([place]) 12/21/2014  . Back pain, chronic 12/21/2014  . Narrowing of  intervertebral disc space 12/21/2014  . Diabetic gastroparesis ([place]) 12/21/2014  . Amaurosis fugax of left eye 12/21/2014  . H/O: hypothyroidism 12/21/2014  . Hyperlipidemia 12/21/2014  . IBS (irritable bowel syndrome) 12/21/2014  . Disorder of macula of retina 12/21/2014  . Asthma, moderate 12/21/2014  . NASH (nonalcoholic steatohepatitis) 12/21/2014  . NS (nuclear sclerosis) 12/21/2014  . Osteoarthrosis 12/21/2014  . Psoriasis 12/21/2014  . Knee torn cartilage 12/21/2014  . Closed dislocation of jaw 12/21/2014  . Diabetes mellitus type 2 in obese  ([place]) 12/21/2014  . Obstructive apnea 01/03/2014  . History of shoulder surgery 04/23/2011    Past Surgical History:  Procedure Laterality Date  . ABDOMINAL HYSTERECTOMY    . APPENDECTOMY    . ARTERY BIOPSY N/A 02/23/2015   Procedure: BIOPSY TEMPORAL ARTERY;  Surgeon: Algernon Huxley, MD;  Location: [facility] ORS;  Service: Vascular;  Laterality: N/A;  . BREAST BIOPSY Left   . EXCISION / CURETTAGE BONE CYST FINGER     Left Index Finger  . PILONIDAL CYST EXCISION    . SHOULDER ARTHROSCOPY Right [id number]   Dr. Mauri Pole  . STOMACH SURGERY  1941   3 arteries leaking in stomach after appendectomy  . TONSILLECTOMY      Family History  Problem Relation Age of Onset  . Rheum arthritis Father   . Diabetes Father   . Pneumonia Father   . Pneumonia Mother   . Diabetes Sister   . Osteoarthritis Sister      Current Outpatient Medications:  .  albuterol (VENTOLIN HFA) 108 (90 BASE) MCG/ACT inhaler, Inhale 2 puffs into the lungs every 4 (four) hours as needed for wheezing (takes every am and then as needed.). , Disp: , Rfl:  .  aspirin 81 MG tablet, Take 81 mg by mouth 2 (two) times a week. , Disp: , Rfl:  .  budesonide-formoterol (SYMBICORT) 160-4.5 MCG/ACT inhaler, Inhale 2 puffs into the lungs every morning., Disp: , Rfl:  .  calcipotriene-betamethasone (TACLONEX) ointment, Apply topically daily., Disp: , Rfl:  .  Calcium Carbonate-Vit D-Min ([employer] CALCIUM 600 + D/MINERALS) 600-400 MG-UNIT TABS, Take 1 tablet by mouth daily., Disp: , Rfl: 1 .  cholecalciferol (VITAMIN D3) 25 MCG (1000 UNIT) tablet, Take 1,000 Units by mouth daily., Disp: , Rfl:  .  Cinnamon 500 MG capsule, Take 1 capsule by mouth 2 (two) times daily., Disp: , Rfl:  .  cyanocobalamin 100 MCG tablet, Take 100 mcg by mouth 2 (two) times a week. , Disp: , Rfl:  .  dicyclomine (BENTYL) 20 MG tablet, TAKE 1 TABLET FOUR TIMES A DAY BEFORE MEALS AND AT BEDTIME, Disp: 100 tablet, Rfl: 14 .  diphenhydrAMINE (SOMINEX) 25 MG tablet, Take 2  tablets by mouth at bedtime., Disp: , Rfl:  .  fluticasone (FLONASE) 50 MCG/ACT nasal spray, Place 2 sprays into both nostrils at bedtime., Disp: 48 g, Rfl: 1 .  folic acid (FOLVITE) 1 MG tablet, , Disp: , Rfl:  .  gabapentin (NEURONTIN) 100 MG capsule, TAKE 2 CAPSULES AT BEDTIME, Disp: 180 capsule, Rfl: 0 .  glipiZIDE (GLUCOTROL XL) 2.5 MG 24 hr tablet, Take 1 tablet by mouth every morning., Disp: , Rfl:  .  glipiZIDE (GLUCOTROL XL) 5 MG 24 hr tablet, Take 1 tablet by mouth every morning., Disp: , Rfl:  .  metFORMIN (GLUCOPHAGE-XR) 500 MG 24 hr tablet, TAKE 3 TABLETS (1500 MG) DAILY, Disp: 270 tablet, Rfl: 3 .  methotrexate (RHEUMATREX) 2.5 MG tablet, Take 6 tablets (15 mg total)  by mouth once a week. Caution:Chemotherapy. Protect from light. (Patient taking differently: Take 20 mg by mouth once a week. Caution:Chemotherapy. Protect from light.), Disp: , Rfl:  .  Multiple Vitamins-Minerals (MULTIVITAMIN ADULTS PO), Take by mouth., Disp: , Rfl:  .  olmesartan (BENICAR) 20 MG tablet, TAKE 1 TABLET DAILY, Disp: 90 tablet, Rfl: 0 .  ONE TOUCH ULTRA TEST test strip, TEST 2 (TWO) TIMES DAILY. DX: E11.69, E11.43. *INS.ONLY PAYS FOR ONCE DAILY TESTING*, Disp: 100 each, Rfl: 2 .  pravastatin (PRAVACHOL) 40 MG tablet, Take 1 tablet (40 mg total) by mouth daily., Disp: 90 tablet, Rfl: 3 .  predniSONE (DELTASONE) 2.5 MG tablet, Take 7.5 mg by mouth daily., Disp: , Rfl:  .  Propylene Glycol (SYSTANE BALANCE OP), Apply to eye. Only as needed, Disp: , Rfl:  .  spironolactone-hydrochlorothiazide (ALDACTAZIDE) 25-25 MG tablet, TAKE ONE-HALF (1/2) TABLET DAILY, Disp: 45 tablet, Rfl: 3 .  vitamin C (ASCORBIC ACID) 500 MG tablet, Take 1 tablet by mouth at bedtime. , Disp: , Rfl:   Allergies  Allergen Reactions  . Doxycycline Other (See Comments)    Weakness, flu like symptoms  . Codeine     Other reaction(s): Other (See Comments) Dizzy  . Lantus  [Insulin Glargine]     made skin hot  . Metformin     Coated  metformin is tolerated well.  Marland Kitchen Penicillin G     Other reaction(s): Unknown  . Sitagliptin   . Sulfa Antibiotics     Other reaction(s): Other (See Comments) Red burning skin    I personally reviewed active problem list, medication list, allergies, family history, social history with the patient/caregiver today.   ROS  Ten systems reviewed and is negative except as mentioned in HPI   Objective  Virtual encounter, vitals not obtained.  Body mass index is 30.02 kg/m.  Physical Exam  Awake, alert and oriented   PHQ2/9: Depression screen [facility] 2/9 07/26/2019 07/23/2019 12/09/2018 12/02/2018 08/20/2018  Decreased Interest 0 0 1 0 2  Down, Depressed, Hopeless 0 0 1 0 0  PHQ - 2 Score 0 0 2 0 2  Altered sleeping 0 - 0 0 0  Tired, decreased energy 0 - 0 0 2  Change in appetite 0 - 0 0 3  Feeling bad or failure about yourself  0 - 0 0 0  Trouble concentrating 0 - 0 0 0  Moving slowly or fidgety/restless 0 - 0 0 0  Suicidal thoughts 0 - 0 0 0  PHQ-9 Score 0 - 2 0 7  Difficult doing work/chores Not difficult at all - Not difficult at all Not difficult at all Not difficult at all  Some recent data might be hidden   PHQ-2/9 Result is negative.    Fall Risk: Fall Risk  07/26/2019 07/23/2019 12/09/2018 12/02/2018 08/20/2018  Falls in the past year? 0 0 0 0 0  Comment - - - - -  Number falls in past yr: 0 0 0 0 -  Injury with Fall? 0 0 0 0 -  Comment - - - - -  Risk for fall due to : - No Fall Risks - - -  Follow up - Falls prevention discussed - Falls evaluation completed -     Assessment & Plan  1. DDD (degenerative disc disease), lumbar  - gabapentin (NEURONTIN) 100 MG capsule; Take 2 capsules (200 mg total) by mouth at bedtime.  Dispense: 180 capsule; Refill: 1  2. Dyslipidemia associated with type 2 diabetes  mellitus ([place])  - pravastatin (PRAVACHOL) 80 MG tablet; Take 1 tablet (80 mg total) by mouth daily.  Dispense: 90 tablet; Refill: 1  3. Hypertension, benign  -  olmesartan (BENICAR) 20 MG tablet; Take 1 tablet (20 mg total) by mouth daily.  Dispense: 90 tablet; Refill: 0  4. Obstructive apnea  Continue CPAP   5. Closed fracture of left foot with routine healing, subsequent encounter  - DG Bone Density; Future  6. Long term (current) use of systemic steroids  - DG Bone Density; Future  7. Encounter for screening mammogram for breast cancer  - MM Digital Screening; Future I discussed the assessment and treatment plan with the patient. The patient was provided an opportunity to ask questions and all were answered. The patient agreed with the plan and demonstrated an understanding of the instructions.  The patient was advised to call back or seek an in-person evaluation if the symptoms worsen or if the condition fails to improve as anticipated.  I provided 25  minutes of non-face-to-face time during this encounter.

## 2019-07-28 LAB — HM DIABETES EYE EXAM

## 2019-08-02 ENCOUNTER — Encounter: Payer: Self-pay | Admitting: Family Medicine

## 2019-08-04 ENCOUNTER — Encounter: Payer: Self-pay | Admitting: Family Medicine

## 2019-08-08 ENCOUNTER — Encounter: Payer: Self-pay | Admitting: Family Medicine

## 2019-08-18 LAB — CBC AND DIFFERENTIAL
HCT: 39 (ref 36–46)
Hemoglobin: 13.3 (ref 12.0–16.0)
Platelets: 302 (ref 150–399)
WBC: 8.9

## 2019-08-18 LAB — BASIC METABOLIC PANEL
BUN: 13 (ref 4–21)
Chloride: 101 (ref 99–108)
Creatinine: 1 (ref 0.5–1.1)
Glucose: 103
Potassium: 3.8 (ref 3.4–5.3)
Sodium: 142 (ref 137–147)

## 2019-08-18 LAB — CBC: RBC: 4.18 (ref 3.87–5.11)

## 2019-08-18 LAB — HEPATIC FUNCTION PANEL
ALT: 17 (ref 7–35)
AST: 23 (ref 13–35)

## 2019-08-18 LAB — POCT ERYTHROCYTE SEDIMENTATION RATE, NON-AUTOMATED: Sed Rate: 5

## 2019-08-21 ENCOUNTER — Encounter: Payer: Self-pay | Admitting: Family Medicine

## 2019-09-14 ENCOUNTER — Encounter: Payer: Self-pay | Admitting: Family Medicine

## 2019-09-15 ENCOUNTER — Encounter: Payer: Self-pay | Admitting: Family Medicine

## 2019-09-18 ENCOUNTER — Other Ambulatory Visit: Payer: Self-pay | Admitting: Family Medicine

## 2019-09-18 NOTE — Telephone Encounter (Signed)
Requested Prescriptions  Pending Prescriptions Disp Refills  . fluticasone (FLONASE) 50 MCG/ACT nasal spray [Pharmacy Med Name: FLUTICASONE PROP NASAL SPRAY 16GM 50MCG] 48 g 3    Sig: USE 2 SPRAYS IN EACH NOSTRIL AT BEDTIME     Ear, Nose, and Throat: Nasal Preparations - Corticosteroids Passed - 09/18/2019 11:11 AM      Passed - Valid encounter within last 12 months    Recent Outpatient Visits          1 month ago Obstructive apnea   [facility] Steele Sizer, MD   9 months ago Wound of left buttock, subsequent encounter   [facility] Steele Sizer, MD   9 months ago Diabetes mellitus type 2 in obese [facility])   [place], [place], FNP   1 year ago [place], MD   1 year ago Sore throat   [facility] [place], Satira Anis, MD      Future Appointments            In 4 months Ancil Boozer, Drue Stager, MD [facility], [place]   In 10 months  [facility], [facility]

## 2019-09-20 ENCOUNTER — Other Ambulatory Visit: Payer: Self-pay

## 2019-09-20 DIAGNOSIS — K580 Irritable bowel syndrome with diarrhea: Secondary | ICD-10-CM

## 2019-09-20 MED ORDER — DICYCLOMINE HCL 20 MG PO TABS: ORAL_TABLET | ORAL | 5 refills | Status: DC

## 2019-09-20 NOTE — Telephone Encounter (Signed)
Refill request for general medication. Dicyclomine   Last office visit:  07/26/2019   Follow up on 01/28/2020

## 2019-09-25 ENCOUNTER — Other Ambulatory Visit: Payer: Self-pay

## 2019-09-25 ENCOUNTER — Emergency Department: Payer: Medicare Other

## 2019-09-25 ENCOUNTER — Emergency Department
Admission: EM | Admit: 2019-09-25 | Discharge: 2019-09-25 | Disposition: A | Discharge status: Home / Self Care | Payer: Medicare Other | Attending: Student | Admitting: Student

## 2019-09-25 DIAGNOSIS — Z79899 Other long term (current) drug therapy: Secondary | ICD-10-CM | POA: Diagnosis not present

## 2019-09-25 DIAGNOSIS — E1122 Type 2 diabetes mellitus with diabetic chronic kidney disease: Secondary | ICD-10-CM | POA: Insufficient documentation

## 2019-09-25 DIAGNOSIS — R079 Chest pain, unspecified: Secondary | ICD-10-CM

## 2019-09-25 DIAGNOSIS — I129 Hypertensive chronic kidney disease with stage 1 through stage 4 chronic kidney disease, or unspecified chronic kidney disease: Secondary | ICD-10-CM | POA: Insufficient documentation

## 2019-09-25 DIAGNOSIS — R0789 Other chest pain: Secondary | ICD-10-CM | POA: Insufficient documentation

## 2019-09-25 DIAGNOSIS — J45909 Unspecified asthma, uncomplicated: Secondary | ICD-10-CM | POA: Diagnosis not present

## 2019-09-25 DIAGNOSIS — N183 Chronic kidney disease, stage 3 unspecified: Secondary | ICD-10-CM | POA: Insufficient documentation

## 2019-09-25 DIAGNOSIS — Z7982 Long term (current) use of aspirin: Secondary | ICD-10-CM | POA: Insufficient documentation

## 2019-09-25 DIAGNOSIS — Z7984 Long term (current) use of oral hypoglycemic drugs: Secondary | ICD-10-CM | POA: Insufficient documentation

## 2019-09-25 LAB — COMPREHENSIVE METABOLIC PANEL WITH GFR
ALT: 19 U/L (ref 0–44)
AST: 22 U/L (ref 15–41)
Albumin: 4 g/dL (ref 3.5–5.0)
Alkaline Phosphatase: 59 U/L (ref 38–126)
Anion gap: 12 (ref 5–15)
BUN: 18 mg/dL (ref 8–23)
CO2: 26 mmol/L (ref 22–32)
Calcium: 9.1 mg/dL (ref 8.9–10.3)
Chloride: 102 mmol/L (ref 98–111)
Creatinine, Ser: 0.73 mg/dL (ref 0.44–1.00)
GFR calc Af Amer: >60 mL/min
GFR calc non Af Amer: >60 mL/min
Glucose, Bld: 122 mg/dL — ABNORMAL HIGH (ref 70–99)
Potassium: 3 mmol/L — ABNORMAL LOW (ref 3.5–5.1)
Sodium: 140 mmol/L (ref 135–145)
Total Bilirubin: 0.8 mg/dL (ref 0.3–1.2)
Total Protein: 6.3 g/dL — ABNORMAL LOW (ref 6.5–8.1)

## 2019-09-25 LAB — CBC WITH DIFFERENTIAL/PLATELET
Abs Immature Granulocytes: 0.05 10*3/uL (ref 0.00–0.07)
Basophils Absolute: 0.1 10*3/uL (ref 0.0–0.1)
Basophils Relative: 1 %
Eosinophils Absolute: 0.2 10*3/uL (ref 0.0–0.5)
Eosinophils Relative: 2 %
HCT: 37.1 % (ref 36.0–46.0)
Hemoglobin: 12.9 g/dL (ref 12.0–15.0)
Immature Granulocytes: 1 %
Lymphocytes Relative: 35 %
Lymphs Abs: 3.2 10*3/uL (ref 0.7–4.0)
MCH: 32.4 pg (ref 26.0–34.0)
MCHC: 34.8 g/dL (ref 30.0–36.0)
MCV: 93.2 fL (ref 80.0–100.0)
Monocytes Absolute: 0.8 10*3/uL (ref 0.1–1.0)
Monocytes Relative: 8 %
Neutro Abs: 4.9 10*3/uL (ref 1.7–7.7)
Neutrophils Relative %: 53 %
Platelets: 296 10*3/uL (ref 150–400)
RBC: 3.98 MIL/uL (ref 3.87–5.11)
RDW: 13.2 % (ref 11.5–15.5)
WBC: 9.2 10*3/uL (ref 4.0–10.5)
nRBC: 0 % (ref 0.0–0.2)

## 2019-09-25 LAB — FIBRIN DERIVATIVES D-DIMER (ARMC ONLY): Fibrin derivatives D-dimer (ARMC): 481.33 ng/mL (FEU) (ref 0.00–499.00)

## 2019-09-25 LAB — TROPONIN I (HIGH SENSITIVITY)
Troponin I (High Sensitivity): 4 ng/L (ref <18)
Troponin I (High Sensitivity): 7 ng/L (ref <18)

## 2019-09-25 MED ORDER — IBUPROFEN 600 MG PO TABS: 600.0000 mg | ORAL_TABLET | Freq: Four times a day (QID) | ORAL | 0 refills | Status: AC | PRN

## 2019-09-25 MED ORDER — ACETAMINOPHEN 500 MG PO TABS
1000.0000 mg | ORAL_TABLET | Freq: Once | ORAL | Status: AC
  Administered 2019-09-25: 1000 mg via ORAL
  Filled 2019-09-25: qty 2

## 2019-09-25 MED ORDER — KETOROLAC TROMETHAMINE 30 MG/ML IJ SOLN
15.0000 mg | Freq: Once | INTRAMUSCULAR | Status: AC
  Administered 2019-09-25: 12:00:00 15 mg via INTRAVENOUS
  Filled 2019-09-25: qty 1

## 2019-09-25 NOTE — ED Triage Notes (Signed)
Pt states that she started having severe rt sided chest pain that radiated to her right shoulder, pt took 4 baby asa and pain went to a 4/10. Pt states the pain is intermittent at this time and can feel dull in the center of her chest. Denies diff breathing

## 2019-09-25 NOTE — Discharge Instructions (Signed)
Thank you for letting us take care of you in the emergency department today.  Your heart markers (troponin) and D-dimer were negative today.  Please continue to take any regular, prescribed medications.   New medications we have prescribed:  Ibuprofen, take scheduled for the next 3 to 4 days to help with your symptoms.  Please follow up with: Your rheumatology doctor to review your ER visit and follow up on your symptoms.  Cardiology doctor, information below.   Please return to the ER for any new or worsening symptoms.

## 2019-09-25 NOTE — ED Provider Notes (Signed)
[facility] Emergency Department Provider Note  ____________________________________________   First MD Initiated Contact with Patient 09/25/19 1042     (approximate)  I have reviewed the triage vital signs and the nursing notes.  History  Chief Complaint Chest Pain    HPI Mary Cantrell is a 74 y.o. female with past medical history as below, including arthritis, polymyalgia rheumatica, GCA, arthritis, asthma who presents to the emergency department for chest pain.  Patient states this morning after making breakfast and feeding her dog she developed acute onset of sharp chest pain.  Feels like a stabbing sensation in the center in the right side of her chest.  At maximum, severity is 10/10, seems to wax and wane in severity without any identifying triggers.  She took 324 mg ASA at home prior to arrival, with mild improvement in severity.  On arrival to the emergency department she states her pain is more dull now, 4/10 in severity.  When pain is at its maximum, she does feel some associated chest heaviness, but denies any discrete shortness of breath.  Did have some mild diaphoresis and nausea with her peak symptoms as well, none currently.  Denies any history of CAD.  No history of VTE or DVT.  Reports a history of costochondritis.   Past Medical Hx Past Medical History:  Diagnosis Date  . Achilles tendinitis of right lower extremity   . Allergy   . Asthma    Moderate  . Chronic back pain   . Depression   . Gastroparesis    DM  . Hyperlipidemia   . Hypertension   . Hypoxemia   . Irritable bowel syndrome   . Maculopathy   . NASH (nonalcoholic steatohepatitis)   . Nuclear sclerosis of both eyes   . OA (osteoarthritis)    Hips  . OSA (obstructive sleep apnea)   . Post-void dribbling   . Psoriasis   . Regurgitation   . SOB (shortness of breath)   . Tear of left rotator cuff   . TMJ (dislocation of temporomandibular joint)   . Trochanteric bursitis  of right hip     Problem List Patient Active Problem List   Diagnosis Date Noted  . Chronic kidney disease, stage III (moderate) 12/09/2018  . Dyslipidemia associated with type 2 diabetes mellitus ([place]) 12/09/2018  . Obesity (BMI 30.0-34.9) 07/17/2018  . Hypertension associated with stage 2 chronic kidney disease due to type 2 diabetes mellitus ([place]) 03/15/2018  . Psoriatic arthritis ([place]) 12/04/2017  . Stenosis of left carotid artery 12/16/2016  . Polymyalgia rheumatica ([place]) 04/13/2015  . Benign hypertension with chronic kidney disease, stage III ([place]) 12/21/2014  . Back pain, chronic 12/21/2014  . Narrowing of intervertebral disc space 12/21/2014  . Diabetic gastroparesis ([place]) 12/21/2014  . Amaurosis fugax of left eye 12/21/2014  . H/O: hypothyroidism 12/21/2014  . Hyperlipidemia 12/21/2014  . IBS (irritable bowel syndrome) 12/21/2014  . Disorder of macula of retina 12/21/2014  . Asthma, moderate 12/21/2014  . NASH (nonalcoholic steatohepatitis) 12/21/2014  . NS (nuclear sclerosis) 12/21/2014  . Osteoarthrosis 12/21/2014  . Psoriasis 12/21/2014  . Knee torn cartilage 12/21/2014  . Closed dislocation of jaw 12/21/2014  . Diabetes mellitus type 2 in obese ([place]) 12/21/2014  . Obstructive apnea 01/03/2014  . History of shoulder surgery 04/23/2011    Past Surgical Hx Past Surgical History:  Procedure Laterality Date  . ABDOMINAL HYSTERECTOMY    . APPENDECTOMY    . ARTERY BIOPSY N/A 02/23/2015   Procedure:  BIOPSY TEMPORAL ARTERY;  Surgeon: Algernon Huxley, MD;  Location: [facility] ORS;  Service: Vascular;  Laterality: N/A;  . BREAST BIOPSY Left   . EXCISION / CURETTAGE BONE CYST FINGER     Left Index Finger  . PILONIDAL CYST EXCISION    . SHOULDER ARTHROSCOPY Right [id number]   Dr. Mauri Pole  . STOMACH SURGERY  1941   3 arteries leaking in stomach after appendectomy  . TONSILLECTOMY      Medications Prior to Admission medications   Medication Sig Start Date End Date Taking?  Authorizing Provider  albuterol (VENTOLIN HFA) 108 (90 BASE) MCG/ACT inhaler Inhale 2 puffs into the lungs every 4 (four) hours as needed for wheezing (takes every am and then as needed.).    Yes [provider]  aspirin 81 MG tablet Take 81 mg by mouth daily.    Yes [provider]  budesonide-formoterol (SYMBICORT) 160-4.5 MCG/ACT inhaler Inhale 2 puffs into the lungs every morning. 08/17/14 09/25/19 Yes Erby Pian, MD  calcipotriene-betamethasone (TACLONEX) ointment Apply topically daily.   Yes [provider]  Calcium Carbonate-Vit D-Min ([employer] CALCIUM 600 + D/MINERALS) 600-400 MG-UNIT TABS Take 1 tablet by mouth daily. 12/07/15  Yes [provider]  cholecalciferol (VITAMIN D3) 25 MCG (1000 UNIT) tablet Take 1,000 Units by mouth daily.   Yes [provider]  Cinnamon 500 MG capsule Take 1,000 mg by mouth daily.    Yes [provider]  cyanocobalamin 100 MCG tablet Take 100 mcg by mouth 2 (two) times a week.    Yes [provider]  dicyclomine (BENTYL) 20 MG tablet TAKE 1 TABLET FOUR TIMES A DAY BEFORE MEALS AND AT BEDTIME Patient taking differently: Take 40 mg by mouth daily.  09/20/19  Yes Sowles, Drue Stager, MD  fluticasone Asencion Islam) 50 MCG/ACT nasal spray USE 2 SPRAYS IN EACH NOSTRIL AT BEDTIME 09/18/19  Yes Sowles, Drue Stager, MD  folic acid (FOLVITE) 1 MG tablet Take 1 mg by mouth daily.  01/22/16  Yes [provider]  gabapentin (NEURONTIN) 100 MG capsule Take 2 capsules (200 mg total) by mouth at bedtime. 07/26/19  Yes Sowles, Drue Stager, MD  glipiZIDE (GLUCOTROL XL) 2.5 MG 24 hr tablet Take 1 tablet by mouth every morning. 07/16/19 07/15/20 Yes [provider]  glipiZIDE (GLUCOTROL XL) 5 MG 24 hr tablet Take 1 tablet by mouth every morning. 07/16/19 07/15/20 Yes [provider]  metFORMIN (GLUCOPHAGE-XR) 500 MG 24 hr tablet TAKE 3 TABLETS (1500 MG) DAILY Patient taking differently: Take 1,500 mg by mouth every  evening.  07/13/19  Yes Sowles, Drue Stager, MD  methotrexate (RHEUMATREX) 2.5 MG tablet Take 6 tablets (15 mg total) by mouth once a week. Caution:Chemotherapy. Protect from light. Patient taking differently: Take 20 mg by mouth once a week. Caution:Chemotherapy. Protect from light. 04/06/18  Yes Sowles, Drue Stager, MD  montelukast (SINGULAIR) 10 MG tablet Take 10 mg by mouth every evening.   Yes [provider]  Multiple Vitamins-Minerals (MULTIVITAMIN ADULTS PO) Take 1 tablet by mouth daily.    Yes [provider]  olmesartan (BENICAR) 20 MG tablet Take 1 tablet (20 mg total) by mouth daily. 07/26/19  Yes Steele Sizer, MD  ONE TOUCH ULTRA TEST test strip TEST 2 (TWO) TIMES DAILY. DX: E11.69, E11.43. *INS.ONLY PAYS FOR ONCE DAILY TESTING* 10/07/18  Yes Ancil Boozer, Drue Stager, MD  pravastatin (PRAVACHOL) 80 MG tablet Take 1 tablet (80 mg total) by mouth daily. 07/26/19  Yes Sowles, Drue Stager, MD  predniSONE (DELTASONE) 2.5 MG tablet Take  7.5 mg by mouth daily. 03/30/15  Yes [provider]  Propylene Glycol (SYSTANE BALANCE OP) Apply to eye. Only as needed   Yes   spironolactone-hydrochlorothiazide (ALDACTAZIDE) 25-25 MG tablet TAKE ONE-HALF (1/2) TABLET DAILY Patient taking differently: Take 1 tablet by mouth daily.  01/12/19  Yes Sowles, Drue Stager, MD  telmisartan (MICARDIS) 20 MG tablet Take 20 mg by mouth daily.   Yes [provider]  vitamin C (ASCORBIC ACID) 500 MG tablet Take 1 tablet by mouth at bedtime.    Yes [provider]    Allergies Doxycycline, Codeine, Lantus  [insulin glargine], Metformin, Penicillin g, Sitagliptin, and Sulfa antibiotics  Family Hx Family History  Problem Relation Age of Onset  . Rheum arthritis Father   . Diabetes Father   . Pneumonia Father   . Pneumonia Mother   . Diabetes Sister   . Osteoarthritis Sister     Social Hx Social History   Tobacco Use  . Smoking status: Never Smoker  . Smokeless tobacco: Never Used    Substance Use Topics  . Alcohol use: No    Alcohol/week: 0.0 standard drinks  . Drug use: No     Review of Systems  Constitutional: Negative for fever. Negative for chills. Eyes: Negative for visual changes. ENT: Negative for sore throat. Cardiovascular: Positive for chest pain. Respiratory: Negative for shortness of breath. Gastrointestinal: Positive for nausea. Negative for vomiting.  Genitourinary: Negative for dysuria. Musculoskeletal: Negative for leg swelling. Skin: Negative for rash.  Positive for diaphoresis. Neurological: Negative for headaches.   Physical Exam  Vital Signs: ED Triage Vitals  Enc Vitals Group     BP 09/25/19 1052 (!) 156/89     Pulse Rate 09/25/19 1052 70     Resp 09/25/19 1052 18     Temp 09/25/19 1052 98.2 F (36.8 C)     Temp Source 09/25/19 1052 Oral     SpO2 09/25/19 1052 97 %     Weight 09/25/19 1053 185 lb (83.9 kg)     Height 09/25/19 1053 5' 6"  (1.676 m)     Head Circumference --      Peak Flow --      Pain Score 09/25/19 1052 4     Pain Loc --      Pain Edu? --      Excl. in [place]? --     Constitutional: Alert and oriented. Well appearing. NAD.  Head: Normocephalic. Atraumatic. Eyes: Conjunctivae clear. Sclera anicteric. Pupils equal and symmetric. Nose: No masses or lesions. No congestion or rhinorrhea. Mouth/Throat: Wearing mask.  Neck: No stridor. Trachea midline.  Cardiovascular: Normal rate, regular rhythm. Extremities well perfused.  TTP to right anterior chest wall and along the right costochondral junction.  No crepitance. Respiratory: Normal respiratory effort.  Lungs CTAB. Gastrointestinal: Soft. Non-distended. Non-tender.  Genitourinary: Deferred. Musculoskeletal: No lower extremity edema. No deformities. Neurologic:  Normal speech and language. No gross focal or lateralizing neurologic deficits are appreciated.  Skin: Skin is warm, dry and intact. No rash noted. Psychiatric: Mood and affect are appropriate for  situation.  EKG  Personally reviewed and interpreted by myself.   Date: 09/25/2019 Time: 10:53 AM Rate: 72 Rhythm: Sinus Axis: Normal Intervals: Within normal limits No STEMI    Radiology  Personally reviewed available imaging myself.    CXR IMPRESSION:  Slight left base atelectasis. Lungs elsewhere clear. Cardiac  silhouette within normal limits.    Procedures  Procedure(s) performed (including critical care):  Procedures   Initial Impression /  Assessment and Plan / MDM / ED Course  74 y.o. female who presents to the ED for an episode of chest pain, as above  Ddx: ACS, angina, pleurisy, costochondritis/MSK, PE, arthritis  Will plan for labs, EKG, XR.  Patient already took ASA prior to arrival.  Clinical Course as of Sep 24 1753  Sat Sep 25, 2019  1222 Initial troponin negative.  D-dimer negative.  CXR grossly unremarkable.  We will plan for delta troponin and continue to monitor.   [SM]  1358 Repeat troponin negative, delta less than 5. Patient reports improvement in symptoms after Toradol. Suspect likely MSK/inflammation/pleurisy component. Will plan for some scheduled ibuprofen for the next few days.  Patient has follow-up with her rheumatologist already scheduled for Tuesday.  Will refer to outpatient cardiology as well.  Patient voices understanding and is comfortable with the plan and discharge, as is husband at bedside.   [SM]    Clinical Course User Index [SM] Lilia Pro., MD     _______________________________   As part of my medical decision making I have reviewed available labs, radiology tests, reviewed old records/chart review, obtained additional history from family (husband at bedside).     Final Clinical Impression(s) / ED Diagnosis  Final diagnoses:  Chest pain in adult       Note:  This document was prepared using Dragon voice recognition software and may include unintentional dictation errors.   Lilia Pro., MD 09/25/19  [phone]

## 2019-10-11 ENCOUNTER — Telehealth (INDEPENDENT_AMBULATORY_CARE_PROVIDER_SITE_OTHER): Payer: Self-pay | Admitting: Vascular Surgery

## 2019-10-11 ENCOUNTER — Other Ambulatory Visit: Payer: Self-pay

## 2019-10-11 ENCOUNTER — Ambulatory Visit (INDEPENDENT_AMBULATORY_CARE_PROVIDER_SITE_OTHER): Payer: Medicare Other | Admitting: Family Medicine

## 2019-10-11 ENCOUNTER — Encounter: Payer: Self-pay | Admitting: Family Medicine

## 2019-10-11 VITALS — BP 130/80 | HR 90 | Temp 97.5°F | Resp 16 | Ht 66.0 in | Wt 191.3 lb

## 2019-10-11 DIAGNOSIS — R0781 Pleurodynia: Secondary | ICD-10-CM

## 2019-10-11 DIAGNOSIS — E1169 Type 2 diabetes mellitus with other specified complication: Secondary | ICD-10-CM

## 2019-10-11 DIAGNOSIS — I10 Essential (primary) hypertension: Secondary | ICD-10-CM

## 2019-10-11 DIAGNOSIS — E785 Hyperlipidemia, unspecified: Secondary | ICD-10-CM

## 2019-10-11 DIAGNOSIS — I739 Peripheral vascular disease, unspecified: Secondary | ICD-10-CM

## 2019-10-11 DIAGNOSIS — E876 Hypokalemia: Secondary | ICD-10-CM | POA: Diagnosis not present

## 2019-10-11 LAB — POCT GLYCOSYLATED HEMOGLOBIN (HGB A1C): Hemoglobin A1C: 7.1 % — AB (ref 4.0–5.6)

## 2019-10-11 LAB — POTASSIUM: Potassium: 3.6 mmol/L (ref 3.5–5.3)

## 2019-10-11 NOTE — Telephone Encounter (Signed)
Called stating that her legs are weak and she can only stand for short periods of time without feeling fatigued. Last seen 01/2018 for Carotid US and F/U w/ JD. PCP advised her to come to be seen. Please advise.

## 2019-10-11 NOTE — Patient Instructions (Signed)
Hypokalemia Hypokalemia means that the amount of potassium in the blood is lower than normal. Potassium is a chemical (electrolyte) that helps regulate the amount of fluid in the body. It also stimulates muscle tightening (contraction) and helps nerves work properly. Normally, most of the body's potassium is inside cells, and only a very small amount is in the blood. Because the amount in the blood is so small, minor changes to potassium levels in the blood can be life-threatening. What are the causes? This condition may be caused by:  Antibiotic medicine.  Diarrhea or vomiting. Taking too much of a medicine that helps you have a bowel movement (laxative) can cause diarrhea and lead to hypokalemia.  Chronic kidney disease (CKD).  Medicines that help the body get rid of excess fluid (diuretics).  Eating disorders, such as bulimia.  Low magnesium levels in the body.  Sweating a lot. What are the signs or symptoms? Symptoms of this condition include:  Weakness.  Constipation.  Fatigue.  Muscle cramps.  Mental confusion.  Skipped heartbeats or irregular heartbeat (palpitations).  Tingling or numbness. How is this diagnosed? This condition is diagnosed with a blood test. How is this treated? This condition may be treated by:  Taking potassium supplements by mouth.  Adjusting the medicines that you take.  Eating more foods that contain a lot of potassium. If your potassium level is very low, you may need to get potassium through an IV and be monitored in the hospital. Follow these instructions at home:   Take over-the-counter and prescription medicines only as told by your health care provider. This includes vitamins and supplements.  Eat a healthy diet. A healthy diet includes fresh fruits and vegetables, whole grains, healthy fats, and lean proteins.  If instructed, eat more foods that contain a lot of potassium. This includes: ? Nuts, such as peanuts and  pistachios. ? Seeds, such as sunflower seeds and pumpkin seeds. ? Peas, lentils, and lima beans. ? Whole grain and bran cereals and breads. ? Fresh fruits and vegetables, such as apricots, avocado, bananas, cantaloupe, kiwi, oranges, tomatoes, asparagus, and potatoes. ? Orange juice. ? Tomato juice. ? Red meats. ? Yogurt.  Keep all follow-up visits as told by your health care provider. This is important. Contact a health care provider if you:  Have weakness that gets worse.  Feel your heart pounding or racing.  Vomit.  Have diarrhea.  Have diabetes (diabetes mellitus) and you have trouble keeping your blood sugar (glucose) in your target range. Get help right away if you:  Have chest pain.  Have shortness of breath.  Have vomiting or diarrhea that lasts for more than 2 days.  Faint. Summary  Hypokalemia means that the amount of potassium in the blood is lower than normal.  This condition is diagnosed with a blood test.  Hypokalemia may be treated by taking potassium supplements, adjusting the medicines that you take, or eating more foods that are high in potassium.  If your potassium level is very low, you may need to get potassium through an IV and be monitored in the hospital. This information is not intended to replace advice given to you by your health care provider. Make sure you discuss any questions you have with your health care provider. Document Revised: 01/21/2018 Document Reviewed: 01/21/2018 Elsevier Patient Education  [place].

## 2019-10-11 NOTE — Telephone Encounter (Signed)
We can get her in with ABIs to see if this is related to a vascular issue. If not, she would need to consult with her PCP for further workup.  Me or Dew

## 2019-10-11 NOTE — Progress Notes (Signed)
Name: Mary Cantrell   MRN: [id number]    DOB: 10-Sep-1945   Date:10/11/2019       Progress Note  Subjective  Chief Complaint  Chief Complaint  Patient presents with  . Gait Problem    She has been having trouble with her both her legs x 3 months. She said her legs feel really tired.    HPI  Pleuritic Chest pain: she states symptoms started suddenly, stabbing like that came in waves - three separate episodes. They called 911, took 4 aspirins and went to [facility], CXR showed atelectasis, cardiac enzymes negative. She was sent home on ibuprofen and symptoms have not recurred since. She states for about one week she had some sob, but that resolved also. I will check potassium again   PAD: she has noticed increase in leg pain, likely heaviness and fatigue when walking or standing. Resolves when she sits down. She was seen by Rheumatologist, Dr. Dossie Der and based on her assessment she recommended PT , but appointment was made in [place] and she would like to be seen in [place]. I also advised to go back to vascular surgeon also. Explained can be neurogenic or vascular.    Patient Active Problem List   Diagnosis Date Noted  . Chronic kidney disease, stage III (moderate) 12/09/2018  . Dyslipidemia associated with type 2 diabetes mellitus ([place]) 12/09/2018  . Obesity (BMI 30.0-34.9) 07/17/2018  . Hypertension associated with stage 2 chronic kidney disease due to type 2 diabetes mellitus ([place]) 03/15/2018  . Psoriatic arthritis ([place]) 12/04/2017  . Stenosis of left carotid artery 12/16/2016  . Polymyalgia rheumatica ([place]) 04/13/2015  . Benign hypertension with chronic kidney disease, stage III ([place]) 12/21/2014  . Back pain, chronic 12/21/2014  . Narrowing of intervertebral disc space 12/21/2014  . Diabetic gastroparesis ([place]) 12/21/2014  . Amaurosis fugax of left eye 12/21/2014  . H/O: hypothyroidism 12/21/2014  . Hyperlipidemia 12/21/2014  . IBS (irritable bowel syndrome) 12/21/2014  . Disorder of  macula of retina 12/21/2014  . Asthma, moderate 12/21/2014  . NASH (nonalcoholic steatohepatitis) 12/21/2014  . NS (nuclear sclerosis) 12/21/2014  . Osteoarthrosis 12/21/2014  . Psoriasis 12/21/2014  . Knee torn cartilage 12/21/2014  . Closed dislocation of jaw 12/21/2014  . Diabetes mellitus type 2 in obese ([place]) 12/21/2014  . Obstructive apnea 01/03/2014  . History of shoulder surgery 04/23/2011    Past Surgical History:  Procedure Laterality Date  . ABDOMINAL HYSTERECTOMY    . APPENDECTOMY    . ARTERY BIOPSY N/A 02/23/2015   Procedure: BIOPSY TEMPORAL ARTERY;  Surgeon: Algernon Huxley, MD;  Location: [facility] ORS;  Service: Vascular;  Laterality: N/A;  . BREAST BIOPSY Left   . EXCISION / CURETTAGE BONE CYST FINGER     Left Index Finger  . PILONIDAL CYST EXCISION    . SHOULDER ARTHROSCOPY Right [id number]   Dr. Mauri Pole  . STOMACH SURGERY  1941   3 arteries leaking in stomach after appendectomy  . TONSILLECTOMY      Family History  Problem Relation Age of Onset  . Rheum arthritis Father   . Diabetes Father   . Pneumonia Father   . Pneumonia Mother   . Diabetes Sister   . Osteoarthritis Sister     Social History   Tobacco Use  . Smoking status: Never Smoker  . Smokeless tobacco: Never Used  Substance Use Topics  . Alcohol use: No    Alcohol/week: 0.0 standard drinks     Current Outpatient Medications:  .  albuterol (  VENTOLIN HFA) 108 (90 BASE) MCG/ACT inhaler, Inhale 2 puffs into the lungs every 4 (four) hours as needed for wheezing (takes every am and then as needed.). , Disp: , Rfl:  .  aspirin 81 MG tablet, Take 81 mg by mouth daily. , Disp: , Rfl:  .  calcipotriene-betamethasone (TACLONEX) ointment, Apply topically daily., Disp: , Rfl:  .  Calcium Carbonate-Vit D-Min ([employer] CALCIUM 600 + D/MINERALS) 600-400 MG-UNIT TABS, Take 1 tablet by mouth daily., Disp: , Rfl: 1 .  cholecalciferol (VITAMIN D3) 25 MCG (1000 UNIT) tablet, Take 1,000 Units by mouth daily., Disp: , Rfl:   .  Cinnamon 500 MG capsule, Take 1,000 mg by mouth daily. , Disp: , Rfl:  .  cyanocobalamin 100 MCG tablet, Take 100 mcg by mouth 2 (two) times a week. , Disp: , Rfl:  .  Cysteamine Bitartrate (PROCYSBI) 300 MG PACK, Use 1 each 3 (three) times daily Use as instructed., Disp: , Rfl:  .  dicyclomine (BENTYL) 20 MG tablet, TAKE 1 TABLET FOUR TIMES A DAY BEFORE MEALS AND AT BEDTIME (Patient taking differently: Take 40 mg by mouth daily. ), Disp: 100 tablet, Rfl: 5 .  fluticasone (FLONASE) 50 MCG/ACT nasal spray, USE 2 SPRAYS IN EACH NOSTRIL AT BEDTIME, Disp: 48 g, Rfl: 3 .  folic acid (FOLVITE) 1 MG tablet, Take 1 mg by mouth daily. , Disp: , Rfl:  .  gabapentin (NEURONTIN) 100 MG capsule, Take 2 capsules (200 mg total) by mouth at bedtime., Disp: 180 capsule, Rfl: 1 .  glipiZIDE (GLUCOTROL XL) 2.5 MG 24 hr tablet, Take 1 tablet by mouth every morning., Disp: , Rfl:  .  glipiZIDE (GLUCOTROL XL) 5 MG 24 hr tablet, Take 1 tablet by mouth every morning., Disp: , Rfl:  .  metFORMIN (GLUCOPHAGE-XR) 500 MG 24 hr tablet, TAKE 3 TABLETS (1500 MG) DAILY (Patient taking differently: Take 1,500 mg by mouth every evening. ), Disp: 270 tablet, Rfl: 3 .  methotrexate (RHEUMATREX) 2.5 MG tablet, Take 6 tablets (15 mg total) by mouth once a week. Caution:Chemotherapy. Protect from light. (Patient taking differently: Take 20 mg by mouth once a week. Caution:Chemotherapy. Protect from light.), Disp: , Rfl:  .  montelukast (SINGULAIR) 10 MG tablet, Take 10 mg by mouth every evening., Disp: , Rfl:  .  Multiple Vitamins-Minerals (MULTIVITAMIN ADULTS PO), Take 1 tablet by mouth daily. , Disp: , Rfl:  .  olmesartan (BENICAR) 20 MG tablet, Take 1 tablet (20 mg total) by mouth daily., Disp: 90 tablet, Rfl: 0 .  ONE TOUCH ULTRA TEST test strip, TEST 2 (TWO) TIMES DAILY. DX: E11.69, E11.43. *INS.ONLY PAYS FOR ONCE DAILY TESTING*, Disp: 100 each, Rfl: 2 .  pravastatin (PRAVACHOL) 80 MG tablet, Take 1 tablet (80 mg total) by mouth  daily., Disp: 90 tablet, Rfl: 1 .  predniSONE (DELTASONE) 2.5 MG tablet, Take 7.5 mg by mouth daily., Disp: , Rfl:  .  Propylene Glycol (SYSTANE BALANCE OP), Apply to eye. Only as needed, Disp: , Rfl:  .  spironolactone-hydrochlorothiazide (ALDACTAZIDE) 25-25 MG tablet, TAKE ONE-HALF (1/2) TABLET DAILY (Patient taking differently: Take 1 tablet by mouth daily. ), Disp: 45 tablet, Rfl: 3 .  telmisartan (MICARDIS) 20 MG tablet, Take 20 mg by mouth daily., Disp: , Rfl:  .  vitamin C (ASCORBIC ACID) 500 MG tablet, Take 1 tablet by mouth at bedtime. , Disp: , Rfl:  .  budesonide-formoterol (SYMBICORT) 160-4.5 MCG/ACT inhaler, Inhale 2 puffs into the lungs every morning., Disp: , Rfl:  Allergies  Allergen Reactions  . Doxycycline Other (See Comments)    Weakness, flu like symptoms  . Codeine     Other reaction(s): Other (See Comments) Dizzy  . Lantus  [Insulin Glargine]     made skin hot  . Metformin     Coated metformin is tolerated well.  Marland Kitchen Penicillin G     Other reaction(s): Unknown  . Sitagliptin   . Sulfa Antibiotics     Other reaction(s): Other (See Comments) Red burning skin    I personally reviewed active problem list, medication list, allergies, family history, social history, health maintenance with the patient/caregiver today.   ROS  Constitutional: Negative for fever or weight change.  Respiratory: positive  for dry cough but no  shortness of breath.   Cardiovascular: positive  For mild  chest discomfort since she went to [facility], but no palpitations.  Gastrointestinal: Negative for abdominal pain, no bowel changes.  Musculoskeletal: Negative for gait problem or joint swelling.  Skin: positive  for rash - psoriasis. Stable .  Neurological: Negative for dizziness or headache.  No other specific complaints in a complete review of systems (except as listed in HPI above).  Objective  Vitals:   10/11/19 1321  BP: 130/80  Pulse: 90  Resp: 16  Temp: (!) 97.5 F (36.4 C)   TempSrc: Temporal  SpO2: 95%  Weight: 191 lb 4.8 oz (86.8 kg)  Height: 5' 6"  (1.676 m)    Body mass index is 30.88 kg/m.  Physical Exam  Constitutional: Patient appears well-developed and well-nourished. Obese  No distress.  HEENT: head atraumatic, normocephalic, pupils equal and reactive to light,  neck supple, throat within normal limits Cardiovascular: Normal rate, regular rhythm and normal heart sounds.  No murmur heard. No BLE edema. Pulmonary/Chest: Effort normal and breath sounds normal. No respiratory distress. Abdominal: Soft.  There is no tenderness. Muscular Skeletal: tender during palpation of lumbar spine , pain with extension , normal flexion, decrease lateral bending. Negative straight leg raise Psychiatric: Patient has a normal mood and affect. behavior is normal. Judgment and thought content normal.  Recent Results (from the past 2160 hour(s))  HM DIABETES EYE EXAM     Status: None   Collection Time: 07/28/19 12:00 AM  Result Value Ref Range   HM Diabetic Eye Exam No Retinopathy No Retinopathy    Comment: [place]  CBC and differential     Status: None   Collection Time: 08/18/19 12:00 AM  Result Value Ref Range   Hemoglobin 13.3 12.0 - 16.0   HCT 39 36 - 46   Platelets 302 150 - 399   WBC 8.9   CBC     Status: None   Collection Time: 08/18/19 12:00 AM  Result Value Ref Range   RBC 4.18 3.87 - 5.11  POCT erythrocyte sed rate, Non-automated     Status: None   Collection Time: 08/18/19 12:00 AM  Result Value Ref Range   Sed Rate 5   Basic metabolic panel     Status: None   Collection Time: 08/18/19 12:00 AM  Result Value Ref Range   Glucose 103    BUN 13 4 - 21   Creatinine 1.0 0.5 - 1.1   Potassium 3.8 3.4 - 5.3   Sodium 142 137 - 147   Chloride 101 99 - 108  Hepatic function panel     Status: None   Collection Time: 08/18/19 12:00 AM  Result Value Ref Range   ALT 17  7 - 35   AST 23 13 - 35  Comprehensive metabolic panel     Status:  Abnormal   Collection Time: 09/25/19 10:43 AM  Result Value Ref Range   Sodium 140 135 - 145 mmol/L   Potassium 3.0 (L) 3.5 - 5.1 mmol/L   Chloride 102 98 - 111 mmol/L   CO2 26 22 - 32 mmol/L   Glucose, Bld 122 (H) 70 - 99 mg/dL    Comment: Glucose reference range applies only to samples taken after fasting for at least 8 hours.   BUN 18 8 - 23 mg/dL   Creatinine, Ser 0.73 0.44 - 1.00 mg/dL   Calcium 9.1 8.9 - 10.3 mg/dL   Total Protein 6.3 (L) 6.5 - 8.1 g/dL   Albumin 4.0 3.5 - 5.0 g/dL   AST 22 15 - 41 U/L   ALT 19 0 - 44 U/L   Alkaline Phosphatase 59 38 - 126 U/L   Total Bilirubin 0.8 0.3 - 1.2 mg/dL   GFR calc non Af Amer >60 >60 mL/min   GFR calc Af Amer >60 >60 mL/min   Anion gap 12 5 - 15    Comment: Performed at [facility], [address] [place], [place] [address]  CBC with Differential     Status: None   Collection Time: 09/25/19 10:43 AM  Result Value Ref Range   WBC 9.2 4.0 - 10.5 K/uL   RBC 3.98 3.87 - 5.11 MIL/uL   Hemoglobin 12.9 12.0 - 15.0 g/dL   HCT 37.1 36.0 - 46.0 %   MCV 93.2 80.0 - 100.0 fL   MCH 32.4 26.0 - 34.0 pg   MCHC 34.8 30.0 - 36.0 g/dL   RDW 13.2 11.5 - 15.5 %   Platelets 296 150 - 400 K/uL   nRBC 0.0 0.0 - 0.2 %   Neutrophils Relative % 53 %   Neutro Abs 4.9 1.7 - 7.7 K/uL   Lymphocytes Relative 35 %   Lymphs Abs 3.2 0.7 - 4.0 K/uL   Monocytes Relative 8 %   Monocytes Absolute 0.8 0.1 - 1.0 K/uL   Eosinophils Relative 2 %   Eosinophils Absolute 0.2 0.0 - 0.5 K/uL   Basophils Relative 1 %   Basophils Absolute 0.1 0.0 - 0.1 K/uL   Immature Granulocytes 1 %   Abs Immature Granulocytes 0.05 0.00 - 0.07 K/uL    Comment: Performed at [facility], [address] [place], [place] [address]  Fibrin derivatives D-Dimer     Status: None   Collection Time: 09/25/19 10:43 AM  Result Value Ref Range   Fibrin derivatives D-dimer ([facility]) 481.33 0.00 - 499.00 ng/mL (FEU)    Comment: (NOTE) <> Exclusion of Venous  Thromboembolism (VTE) - OUTPATIENT ONLY   (Emergency Department or [place])   0-499 ng/ml (FEU): With a low to intermediate pretest probability                      for VTE this test result excludes the diagnosis                      of VTE.   >499 ng/ml (FEU) : VTE not excluded; additional work up for VTE is                      required. <> Testing on Inpatients and Evaluation of Disseminated Intravascular   Coagulation (DIC) Reference Range:   0-499 ng/ml (  FEU) Performed at [facility], [place], [place] [address]   Troponin I (High Sensitivity)     Status: None   Collection Time: 09/25/19 10:43 AM  Result Value Ref Range   Troponin I (High Sensitivity) 4 <18 ng/L    Comment: (NOTE) Elevated high sensitivity troponin I (hsTnI) values and significant  changes across serial measurements may suggest ACS but many other  chronic and acute conditions are known to elevate hsTnI results.  Refer to the "Links" section for chest pain algorithms and additional  guidance. Performed at [facility], [place], [place] [address]   Troponin I (High Sensitivity)     Status: None   Collection Time: 09/25/19 12:31 PM  Result Value Ref Range   Troponin I (High Sensitivity) 7 <18 ng/L    Comment: (NOTE) Elevated high sensitivity troponin I (hsTnI) values and significant  changes across serial measurements may suggest ACS but many other  chronic and acute conditions are known to elevate hsTnI results.  Refer to the "Links" section for chest pain algorithms and additional  guidance. Performed at [facility], [place], [place] [address]       PHQ2/9: Depression screen [facility] 2/9 10/11/2019 07/26/2019 07/23/2019 12/09/2018 12/02/2018  Decreased Interest 0 0 0 1 0  Down, Depressed, Hopeless 0 0 0 1 0  PHQ - 2 Score 0 0 0 2 0  Altered sleeping 0 0 - 0 0  Tired, decreased energy 0 0 - 0 0  Change in appetite 0 0 - 0 0   Feeling bad or failure about yourself  0 0 - 0 0  Trouble concentrating 0 0 - 0 0  Moving slowly or fidgety/restless 0 0 - 0 0  Suicidal thoughts 0 0 - 0 0  PHQ-9 Score 0 0 - 2 0  Difficult doing work/chores - Not difficult at all - Not difficult at all Not difficult at all  Some recent data might be hidden    phq 9 is negative   Fall Risk: Fall Risk  10/11/2019 07/26/2019 07/23/2019 12/09/2018 12/02/2018  Falls in the past year? 0 0 0 0 0  Comment - - - - -  Number falls in past yr: 0 0 0 0 0  Injury with Fall? 0 0 0 0 0  Comment - - - - -  Risk for fall due to : - - No Fall Risks - -  Follow up - - Falls prevention discussed - Falls evaluation completed    Functional Status Survey: Is the patient deaf or have difficulty hearing?: Yes Does the patient have difficulty seeing, even when wearing glasses/contacts?: No Does the patient have difficulty concentrating, remembering, or making decisions?: No Does the patient have difficulty walking or climbing stairs?: Yes Does the patient have difficulty dressing or bathing?: Yes Does the patient have difficulty doing errands alone such as visiting a doctor's office or shopping?: No   Assessment & Plan  1. Dyslipidemia associated with type 2 diabetes mellitus (HCC)  - POCT HgB A1C  2. Pleuritic chest pain  Doing better   3. Hypertension, benign  At goal today   4. Hypokalemia  - Potassium  5. Claudication [facility])  - Ambulatory referral to Physical Therapy

## 2019-10-18 ENCOUNTER — Other Ambulatory Visit (INDEPENDENT_AMBULATORY_CARE_PROVIDER_SITE_OTHER): Payer: Self-pay | Admitting: Nurse Practitioner

## 2019-10-18 DIAGNOSIS — R29898 Other symptoms and signs involving the musculoskeletal system: Secondary | ICD-10-CM

## 2019-10-22 ENCOUNTER — Ambulatory Visit (INDEPENDENT_AMBULATORY_CARE_PROVIDER_SITE_OTHER): Payer: Medicare Other | Admitting: Nurse Practitioner

## 2019-10-22 ENCOUNTER — Other Ambulatory Visit: Payer: Self-pay

## 2019-10-22 ENCOUNTER — Encounter (INDEPENDENT_AMBULATORY_CARE_PROVIDER_SITE_OTHER): Payer: Self-pay | Admitting: Nurse Practitioner

## 2019-10-22 ENCOUNTER — Ambulatory Visit (INDEPENDENT_AMBULATORY_CARE_PROVIDER_SITE_OTHER): Payer: Medicare Other

## 2019-10-22 VITALS — BP 121/78 | HR 85 | Resp 16 | Wt 195.0 lb

## 2019-10-22 DIAGNOSIS — R29898 Other symptoms and signs involving the musculoskeletal system: Secondary | ICD-10-CM

## 2019-10-22 DIAGNOSIS — M9979 Connective tissue and disc stenosis of intervertebral foramina of abdomen and other regions: Secondary | ICD-10-CM | POA: Diagnosis not present

## 2019-10-22 DIAGNOSIS — I6522 Occlusion and stenosis of left carotid artery: Secondary | ICD-10-CM

## 2019-10-22 NOTE — Progress Notes (Signed)
Subjective:    Patient ID: Mary Cantrell, female    DOB: 10/24/1945, 74 y.o.   MRN: [id number] Chief Complaint  Patient presents with  . Follow-up    ultrasound follow up    Patient presents today with complaints of bilateral lower extremity leg weakness.  Patient states that this happened around January.  She says that she does not experience pain however is a very uncomfortable feeling.  She has weakness and exhaustion with short distances.  Feels like her legs are giving out and she is going to fall.  We have previously seen the patient for carotid artery stenosis.  However we have not seen the patient for peripheral vascular disease.  She denies any traditional claudication-like symptoms.  She denies any rest pain like symptoms.  She denies any lower extremity wounds or ulcerations.  The patient does have a history of back pain.  Today noninvasive studies show a right ABI 1.13 and a left ABI of 1.12.  The patient has strong triphasic waveforms within the bilateral tibial arteries and good toe waveforms bilaterally.   Review of Systems  Neurological: Positive for weakness.  All other systems reviewed and are negative.      Objective:   Physical Exam Vitals reviewed.  Constitutional:      Appearance: Normal appearance.  Neck:     Vascular: No carotid bruit.  Cardiovascular:     Rate and Rhythm: Normal rate and regular rhythm.     Pulses: Normal pulses.     Heart sounds: Normal heart sounds.  Pulmonary:     Effort: Pulmonary effort is normal.     Breath sounds: Normal breath sounds.  Neurological:     Mental Status: She is alert and oriented to person, place, and time.  Psychiatric:        Mood and Affect: Mood normal.        Behavior: Behavior normal.        Thought Content: Thought content normal.        Judgment: Judgment normal.     BP 121/78 (BP Location: Right Arm)   Pulse 85   Resp 16   Wt 195 lb (88.5 kg)   BMI 31.47 kg/m   Past Medical History:  Diagnosis  Date  . Achilles tendinitis of right lower extremity   . Allergy   . Asthma    Moderate  . Chronic back pain   . Depression   . Gastroparesis    DM  . Hyperlipidemia   . Hypertension   . Hypoxemia   . Irritable bowel syndrome   . Maculopathy   . NASH (nonalcoholic steatohepatitis)   . Nuclear sclerosis of both eyes   . OA (osteoarthritis)    Hips  . OSA (obstructive sleep apnea)   . Post-void dribbling   . Psoriasis   . Regurgitation   . SOB (shortness of breath)   . Tear of left rotator cuff   . TMJ (dislocation of temporomandibular joint)   . Trochanteric bursitis of right hip     Social History   Socioeconomic History  . Marital status: Married    Spouse name: Not on file  . Number of children: 2  . Years of education: Not on file  . Highest education level: Not on file  Occupational History  . Occupation: retired  Tobacco Use  . Smoking status: Never Smoker  . Smokeless tobacco: Never Used  Substance and Sexual Activity  . Alcohol use: No    Alcohol/week:  0.0 standard drinks  . Drug use: No  . Sexual activity: Yes    Partners: Male    Comment: hysterectomy  Other Topics Concern  . Not on file  Social History Narrative  . Not on file   Social Determinants of Health   Financial Resource Strain: Low Risk   . Difficulty of Paying Living Expenses: Not hard at all  Food Insecurity: No Food Insecurity  . Worried About Charity fundraiser in the Last Year: Never true  . Ran Out of Food in the Last Year: Never true  Transportation Needs: No Transportation Needs  . Lack of Transportation (Medical): No  . Lack of Transportation (Non-Medical): No  Physical Activity: Inactive  . Days of Exercise per Week: 0 days  . Minutes of Exercise per Session: 0 min  Stress: No Stress Concern Present  . Feeling of Stress : Not at all  Social Connections: Somewhat Isolated  . Frequency of Communication with Friends and Family: More than three times a week  . Frequency  of Social Gatherings with Friends and Family: Never  . Attends Religious Services: Never  . Active Member of Clubs or Organizations: No  . Attends Archivist Meetings: Never  . Marital Status: Married  Human resources officer Violence: Not At Risk  . Fear of Current or Ex-Partner: No  . Emotionally Abused: No  . Physically Abused: No  . Sexually Abused: No    Past Surgical History:  Procedure Laterality Date  . ABDOMINAL HYSTERECTOMY    . APPENDECTOMY    . ARTERY BIOPSY N/A 02/23/2015   Procedure: BIOPSY TEMPORAL ARTERY;  Surgeon: Algernon Huxley, MD;  Location: [facility] ORS;  Service: Vascular;  Laterality: N/A;  . BREAST BIOPSY Left   . EXCISION / CURETTAGE BONE CYST FINGER     Left Index Finger  . PILONIDAL CYST EXCISION    . SHOULDER ARTHROSCOPY Right [id number]   Dr. Mauri Pole  . STOMACH SURGERY  1941   3 arteries leaking in stomach after appendectomy  . TONSILLECTOMY      Family History  Problem Relation Age of Onset  . Rheum arthritis Father   . Diabetes Father   . Pneumonia Father   . Pneumonia Mother   . Diabetes Sister   . Osteoarthritis Sister     Allergies  Allergen Reactions  . Doxycycline Other (See Comments)    Weakness, flu like symptoms  . Codeine     Other reaction(s): Other (See Comments) Dizzy  . Lantus  [Insulin Glargine]     made skin hot  . Metformin     Coated metformin is tolerated well.  Marland Kitchen Penicillin G     Other reaction(s): Unknown  . Sitagliptin   . Sulfa Antibiotics     Other reaction(s): Other (See Comments) Red burning skin       Assessment & Plan:   1. Leg weakness, bilateral Recommend:  I do not find evidence of Vascular pathology that would explain the patient's symptoms  The patient has atypical pain symptoms for vascular disease  I do not find evidence of Vascular pathology that would explain the patient's symptoms and I suspect the patient is c/o pseudoclaudication.  Patient should have an evaluation of his LS spine which I  defer to the primary service.  Noninvasive studies including venous ultrasound of the legs do not identify vascular problems  The patient should continue walking and begin a more formal exercise program. The patient should continue his antiplatelet therapy and  aggressive treatment of the lipid abnormalities. The patient should begin wearing graduated compression socks 15-20 mmHg strength to control her mild edema.  Patient will follow-up with me on a PRN basis  Further work-up of her lower extremity pain is deferred to the primary service     2. Stenosis of left carotid artery Patient had minimal stenosis at last carotid artery duplex almost 2 years ago.  The patient would be on biannual scans in the next will be due in about 6 months.  However, the patient plans on relocating in the next 4 months or so.  Based on this we will hold off scheduling a carotid duplex.  Patient is advised to contact our office to schedule follow-up if she does not relocate.  3. Narrowing of intervertebral disc space This is the likely cause of the patient's lower extremity weakness.  Patient will follow up with PCP for further work-up.   Current Outpatient Medications on File Prior to Visit  Medication Sig Dispense Refill  . albuterol (VENTOLIN HFA) 108 (90 BASE) MCG/ACT inhaler Inhale 2 puffs into the lungs every 4 (four) hours as needed for wheezing (takes every am and then as needed.).     Marland Kitchen aspirin 81 MG tablet Take 81 mg by mouth daily.     . budesonide-formoterol (SYMBICORT) 160-4.5 MCG/ACT inhaler Inhale 2 puffs into the lungs every morning.    . calcipotriene-betamethasone (TACLONEX) ointment Apply topically daily.    . Calcium Carbonate-Vit D-Min ([employer] CALCIUM 600 + D/MINERALS) 600-400 MG-UNIT TABS Take 1 tablet by mouth daily.  1  . cholecalciferol (VITAMIN D3) 25 MCG (1000 UNIT) tablet Take 1,000 Units by mouth daily.    . Cinnamon 500 MG capsule Take 1,000 mg by mouth daily.     . cyanocobalamin  100 MCG tablet Take 100 mcg by mouth 2 (two) times a week.     . Cysteamine Bitartrate (PROCYSBI) 300 MG PACK Use 1 each 3 (three) times daily Use as instructed.    . dicyclomine (BENTYL) 20 MG tablet TAKE 1 TABLET FOUR TIMES A DAY BEFORE MEALS AND AT BEDTIME (Patient taking differently: Take 40 mg by mouth daily. ) 100 tablet 5  . fluticasone (FLONASE) 50 MCG/ACT nasal spray USE 2 SPRAYS IN EACH NOSTRIL AT BEDTIME 48 g 3  . folic acid (FOLVITE) 1 MG tablet Take 1 mg by mouth daily.     Marland Kitchen gabapentin (NEURONTIN) 100 MG capsule Take 2 capsules (200 mg total) by mouth at bedtime. 180 capsule 1  . glipiZIDE (GLUCOTROL XL) 2.5 MG 24 hr tablet Take 1 tablet by mouth every morning.    Marland Kitchen glipiZIDE (GLUCOTROL XL) 5 MG 24 hr tablet Take 1 tablet by mouth every morning.    . metFORMIN (GLUCOPHAGE-XR) 500 MG 24 hr tablet TAKE 3 TABLETS (1500 MG) DAILY (Patient taking differently: Take 1,500 mg by mouth every evening. ) 270 tablet 3  . methotrexate (RHEUMATREX) 2.5 MG tablet Take 6 tablets (15 mg total) by mouth once a week. Caution:Chemotherapy. Protect from light. (Patient taking differently: Take 20 mg by mouth once a week. Caution:Chemotherapy. Protect from light.)    . montelukast (SINGULAIR) 10 MG tablet Take 10 mg by mouth every evening.    . Multiple Vitamins-Minerals (MULTIVITAMIN ADULTS PO) Take 1 tablet by mouth daily.     Marland Kitchen olmesartan (BENICAR) 20 MG tablet Take 1 tablet (20 mg total) by mouth daily. 90 tablet 0  . ONE TOUCH ULTRA TEST test strip TEST 2 (TWO) TIMES DAILY.  DX: E11.69, E11.43. *INS.ONLY PAYS FOR ONCE DAILY TESTING* 100 each 2  . pravastatin (PRAVACHOL) 80 MG tablet Take 1 tablet (80 mg total) by mouth daily. 90 tablet 1  . predniSONE (DELTASONE) 2.5 MG tablet Take 7.5 mg by mouth daily.    Marland Kitchen Propylene Glycol (SYSTANE BALANCE OP) Apply to eye. Only as needed    . spironolactone-hydrochlorothiazide (ALDACTAZIDE) 25-25 MG tablet TAKE ONE-HALF (1/2) TABLET DAILY (Patient taking  differently: Take 1 tablet by mouth daily. ) 45 tablet 3  . telmisartan (MICARDIS) 20 MG tablet Take 20 mg by mouth daily.    . vitamin C (ASCORBIC ACID) 500 MG tablet Take 1 tablet by mouth at bedtime.      No current facility-administered medications on file prior to visit.    There are no Patient Instructions on file for this visit. No follow-ups on file.   Kris Hartmann, NP

## 2019-10-30 ENCOUNTER — Encounter: Payer: Self-pay | Admitting: Family Medicine

## 2019-12-08 ENCOUNTER — Encounter: Payer: Self-pay | Admitting: Family Medicine

## 2019-12-08 ENCOUNTER — Telehealth (INDEPENDENT_AMBULATORY_CARE_PROVIDER_SITE_OTHER): Payer: Medicare Other | Admitting: Family Medicine

## 2019-12-08 ENCOUNTER — Other Ambulatory Visit: Payer: Self-pay

## 2019-12-08 VITALS — BP 138/81 | HR 83 | Temp 97.5°F | Ht 66.0 in | Wt 195.0 lb

## 2019-12-08 DIAGNOSIS — J029 Acute pharyngitis, unspecified: Secondary | ICD-10-CM | POA: Diagnosis not present

## 2019-12-08 DIAGNOSIS — I6522 Occlusion and stenosis of left carotid artery: Secondary | ICD-10-CM

## 2019-12-08 DIAGNOSIS — E785 Hyperlipidemia, unspecified: Secondary | ICD-10-CM | POA: Diagnosis not present

## 2019-12-08 DIAGNOSIS — E1169 Type 2 diabetes mellitus with other specified complication: Secondary | ICD-10-CM | POA: Diagnosis not present

## 2019-12-08 MED ORDER — ROSUVASTATIN CALCIUM 20 MG PO TABS: 20.0000 mg | ORAL_TABLET | Freq: Every day | ORAL | 0 refills | Status: DC

## 2019-12-08 MED ORDER — CEFDINIR 300 MG PO CAPS: 300.0000 mg | ORAL_CAPSULE | Freq: Two times a day (BID) | ORAL | 0 refills | Status: DC

## 2019-12-08 NOTE — Progress Notes (Signed)
Name: Mary Cantrell   MRN: [id number]    DOB: 01-Jul-1945   Date:12/08/2019       Progress Note  Subjective  Chief Complaint  Chief Complaint  Patient presents with   Sore Throat   Neck Pain    I connected with  Mary Cantrell on 12/08/19 at 11:00 AM EDT by telephone and verified that I am speaking with the correct person using two identifiers.  I discussed the limitations, risks, security and privacy concerns of performing an evaluation and management service by telephone and the availability of in person appointments. Staff also discussed with the patient that there may be a patient responsible charge related to this service. Patient Location: at home  Provider Location: [facility]   HPI  Neck pain: she states last week she noticed a pain on anterior right neck when she turns to the right, yesterday she noticed dysphagia when swallowing and this morning she has pain under right ear. Woke up this am with pain even when talking and also hoarseness. She denies fever, chills, but she has noticed a change in appetite. She denies dry  mouth, no lumps on her neck. She has a history of tonsillectomy. Discussed possible peritonsillar abscess   Patient Active Problem List   Diagnosis Date Noted   Chronic kidney disease, stage III (moderate) 12/09/2018   Dyslipidemia associated with type 2 diabetes mellitus ([place]) 12/09/2018   Obesity (BMI 30.0-34.9) 07/17/2018   Hypertension associated with stage 2 chronic kidney disease due to type 2 diabetes mellitus ([place]) 03/15/2018   Psoriatic arthritis ([place]) 12/04/2017   Stenosis of left carotid artery 12/16/2016   Polymyalgia rheumatica ([place]) 04/13/2015   Benign hypertension with chronic kidney disease, stage III ([place]) 12/21/2014   Back pain, chronic 12/21/2014   Narrowing of intervertebral disc space 12/21/2014   Diabetic gastroparesis ([place]) 12/21/2014   Amaurosis fugax of left eye 12/21/2014   H/O: hypothyroidism 12/21/2014    Hyperlipidemia 12/21/2014   IBS (irritable bowel syndrome) 12/21/2014   Disorder of macula of retina 12/21/2014   Asthma, moderate 12/21/2014   NASH (nonalcoholic steatohepatitis) 12/21/2014   NS (nuclear sclerosis) 12/21/2014   Osteoarthrosis 12/21/2014   Psoriasis 12/21/2014   Knee torn cartilage 12/21/2014   Closed dislocation of jaw 12/21/2014   Diabetes mellitus type 2 in obese ([place]) 12/21/2014   Obstructive apnea 01/03/2014   History of shoulder surgery 04/23/2011    Past Surgical History:  Procedure Laterality Date   ABDOMINAL HYSTERECTOMY     APPENDECTOMY     ARTERY BIOPSY N/A 02/23/2015   Procedure: BIOPSY TEMPORAL ARTERY;  Surgeon: Algernon Huxley, MD;  Location: [facility] ORS;  Service: Vascular;  Laterality: N/A;   BREAST BIOPSY Left    EXCISION / CURETTAGE BONE CYST FINGER     Left Index Finger   PILONIDAL CYST EXCISION     SHOULDER ARTHROSCOPY Right [id number]   Dr. Mauri Pole   STOMACH SURGERY  1941   3 arteries leaking in stomach after appendectomy   TONSILLECTOMY      Family History  Problem Relation Age of Onset   Rheum arthritis Father    Diabetes Father    Pneumonia Father    Pneumonia Mother    Diabetes Sister    Osteoarthritis Sister       Current Outpatient Medications:    albuterol (VENTOLIN HFA) 108 (90 BASE) MCG/ACT inhaler, Inhale 2 puffs into the lungs every 4 (four) hours as needed for wheezing (takes every am and then as needed.). , Disp: ,  Rfl:    aspirin 81 MG tablet, Take 81 mg by mouth daily. , Disp: , Rfl:    calcipotriene-betamethasone (TACLONEX) ointment, Apply topically daily., Disp: , Rfl:    Calcium Carbonate-Vit D-Min ([employer]S CALCIUM 600 + D/MINERALS) 600-400 MG-UNIT TABS, Take 1 tablet by mouth daily., Disp: , Rfl: 1   cholecalciferol (VITAMIN D3) 25 MCG (1000 UNIT) tablet, Take 1,000 Units by mouth daily., Disp: , Rfl:    Cinnamon 500 MG capsule, Take 1,000 mg by mouth daily. , Disp: , Rfl:    cyanocobalamin  100 MCG tablet, Take 100 mcg by mouth 2 (two) times a week. , Disp: , Rfl:    Cysteamine Bitartrate (PROCYSBI) 300 MG PACK, Use 1 each 3 (three) times daily Use as instructed., Disp: , Rfl:    dicyclomine (BENTYL) 20 MG tablet, TAKE 1 TABLET FOUR TIMES A DAY BEFORE MEALS AND AT BEDTIME (Patient taking differently: Take 40 mg by mouth daily. ), Disp: 100 tablet, Rfl: 5   fluticasone (FLONASE) 50 MCG/ACT nasal spray, USE 2 SPRAYS IN EACH NOSTRIL AT BEDTIME, Disp: 48 g, Rfl: 3   folic acid (FOLVITE) 1 MG tablet, Take 1 mg by mouth daily. , Disp: , Rfl:    gabapentin (NEURONTIN) 100 MG capsule, Take 2 capsules (200 mg total) by mouth at bedtime., Disp: 180 capsule, Rfl: 1   glipiZIDE (GLUCOTROL XL) 2.5 MG 24 hr tablet, Take 1 tablet by mouth every morning., Disp: , Rfl:    glipiZIDE (GLUCOTROL XL) 5 MG 24 hr tablet, Take 1 tablet by mouth every morning., Disp: , Rfl:    metFORMIN (GLUCOPHAGE-XR) 500 MG 24 hr tablet, TAKE 3 TABLETS (1500 MG) DAILY (Patient taking differently: Take 1,500 mg by mouth every evening. ), Disp: 270 tablet, Rfl: 3   methotrexate (RHEUMATREX) 2.5 MG tablet, Take 6 tablets (15 mg total) by mouth once a week. Caution:Chemotherapy. Protect from light. (Patient taking differently: Take 20 mg by mouth once a week. Caution:Chemotherapy. Protect from light.), Disp: , Rfl:    montelukast (SINGULAIR) 10 MG tablet, Take 10 mg by mouth every evening., Disp: , Rfl:    Multiple Vitamins-Minerals (MULTIVITAMIN ADULTS PO), Take 1 tablet by mouth daily. , Disp: , Rfl:    olmesartan (BENICAR) 20 MG tablet, Take 1 tablet (20 mg total) by mouth daily., Disp: 90 tablet, Rfl: 0   ONE TOUCH ULTRA TEST test strip, TEST 2 (TWO) TIMES DAILY. DX: E11.69, E11.43. *INS.ONLY PAYS FOR ONCE DAILY TESTING*, Disp: 100 each, Rfl: 2   predniSONE (DELTASONE) 5 MG tablet, Take 5 mg by mouth daily., Disp: , Rfl:    Propylene Glycol (SYSTANE BALANCE OP), Apply to eye. Only as needed, Disp: , Rfl:     spironolactone-hydrochlorothiazide (ALDACTAZIDE) 25-25 MG tablet, TAKE ONE-HALF (1/2) TABLET DAILY (Patient taking differently: Take 1 tablet by mouth daily. ), Disp: 45 tablet, Rfl: 3   telmisartan (MICARDIS) 20 MG tablet, Take 20 mg by mouth daily., Disp: , Rfl:    vitamin C (ASCORBIC ACID) 500 MG tablet, Take 1 tablet by mouth at bedtime. , Disp: , Rfl:    budesonide-formoterol (SYMBICORT) 160-4.5 MCG/ACT inhaler, Inhale 2 puffs into the lungs every morning., Disp: , Rfl:    cefdinir (OMNICEF) 300 MG capsule, Take 1 capsule (300 mg total) by mouth 2 (two) times daily., Disp: 14 capsule, Rfl: 0   rosuvastatin (CRESTOR) 20 MG tablet, Take 1 tablet (20 mg total) by mouth daily. In place of pravastatin, Disp: 90 tablet, Rfl: 0  Allergies  Allergen Reactions   Doxycycline Other (See Comments)    Weakness, flu like symptoms   Codeine     Other reaction(s): Other (See Comments) Dizzy   Lantus  [Insulin Glargine]     made skin hot   Metformin     Coated metformin is tolerated well.   Penicillin G     Other reaction(s): Unknown   Sitagliptin    Sulfa Antibiotics     Other reaction(s): Other (See Comments) Red burning skin    I personally reviewed active problem list, medication list, allergies with the patient/caregiver today.   ROS  Ten systems reviewed and is negative except as mentioned in HPI   Objective  Virtual encounter, vitals not obtained.  Body mass index is 31.47 kg/m.  Physical Exam  Awake, alert and oriented   PHQ2/9: Depression screen [facility] 2/9 12/08/2019 10/11/2019 07/26/2019 07/23/2019 12/09/2018  Decreased Interest 0 0 0 0 1  Down, Depressed, Hopeless 0 0 0 0 1  PHQ - 2 Score 0 0 0 0 2  Altered sleeping 0 0 0 - 0  Tired, decreased energy 1 0 0 - 0  Change in appetite 0 0 0 - 0  Feeling bad or failure about yourself  0 0 0 - 0  Trouble concentrating 0 0 0 - 0  Moving slowly or fidgety/restless 0 0 0 - 0  Suicidal thoughts 0 0 0 - 0  PHQ-9 Score 1  0 0 - 2  Difficult doing work/chores Not difficult at all - Not difficult at all - Not difficult at all  Some recent data might be hidden   PHQ-2/9 Result is negative.    Fall Risk: Fall Risk  12/08/2019 10/11/2019 07/26/2019 07/23/2019 12/09/2018  Falls in the past year? 0 0 0 0 0  Comment - - - - -  Number falls in past yr: - 0 0 0 0  Injury with Fall? - 0 0 0 0  Comment - - - - -  Risk for fall due to : - - - No Fall Risks -  Follow up - - - Falls prevention discussed -    Assessment & Plan  1. Sore throat  Discussed thyroiditis, peritonsillar abscess need to follow up in person if no improvement in 48-72 hours. Offered duke's mouth wash but not interested at this time - cefdinir (OMNICEF) 300 MG capsule; Take 1 capsule (300 mg total) by mouth 2 (two) times daily.  Dispense: 14 capsule; Refill: 0  2. Dyslipidemia associated with type 2 diabetes mellitus (HCC)  Could not tolerated pravastatin 80 mg, back at 40 mg we will change to crestor  - rosuvastatin (CRESTOR) 20 MG tablet; Take 1 tablet (20 mg total) by mouth daily. In place of pravastatin  Dispense: 90 tablet; Refill: 0  I discussed the assessment and treatment plan with the patient. The patient was provided an opportunity to ask questions and all were answered. The patient agreed with the plan and demonstrated an understanding of the instructions.   The patient was advised to call back or seek an in-person evaluation if the symptoms worsen or if the condition fails to improve as anticipated.  I provided 15 minutes of non-face-to-face time during this encounter.  Loistine Chance, MD

## 2019-12-09 ENCOUNTER — Other Ambulatory Visit: Payer: Self-pay | Admitting: Family Medicine

## 2019-12-09 DIAGNOSIS — I10 Essential (primary) hypertension: Secondary | ICD-10-CM

## 2019-12-11 ENCOUNTER — Encounter: Payer: Self-pay | Admitting: Family Medicine

## 2019-12-15 ENCOUNTER — Other Ambulatory Visit: Payer: Self-pay | Admitting: Family Medicine

## 2019-12-15 DIAGNOSIS — I10 Essential (primary) hypertension: Secondary | ICD-10-CM

## 2019-12-15 NOTE — Telephone Encounter (Signed)
Requested Prescriptions  Pending Prescriptions Disp Refills  . spironolactone-hydrochlorothiazide (ALDACTAZIDE) 25-25 MG tablet [Pharmacy Med Name: SPIRONOLACTONE/HCTZ TABS 25/25MG] 45 tablet 3    Sig: TAKE ONE-HALF (1/2) TABLET DAILY     Cardiovascular: Diuretic Combos Passed - 12/15/2019 12:58 AM      Passed - K in normal range and within 360 days    Potassium  Date Value Ref Range Status  10/11/2019 3.6 3.5 - 5.3 mmol/L Final  12/31/2011 3.5 3.5 - 5.1 mmol/L Final         Passed - Na in normal range and within 360 days    Sodium  Date Value Ref Range Status  09/25/2019 140 135 - 145 mmol/L Final  08/18/2019 142 137 - 147 Final  12/31/2011 139 136 - 145 mmol/L Final         Passed - Cr in normal range and within 360 days    Creat  Date Value Ref Range Status  04/06/2018 0.98 (H) 0.60 - 0.93 mg/dL Final    Comment:    For patients >14 years of age, the reference limit for Creatinine is approximately 13% higher for people identified as African-American. .    Creatinine, Ser  Date Value Ref Range Status  09/25/2019 0.73 0.44 - 1.00 mg/dL Final   Creatinine, Urine  Date Value Ref Range Status  12/04/2017 75 20 - 275 mg/dL Final         Passed - Ca in normal range and within 360 days    Calcium  Date Value Ref Range Status  09/25/2019 9.1 8.9 - 10.3 mg/dL Final   Calcium, Total  Date Value Ref Range Status  12/31/2011 9.5 8.5 - 10.1 mg/dL Final         Passed - Last BP in normal range    BP Readings from Last 1 Encounters:  12/08/19 138/81         Passed - Valid encounter within last 6 months    Recent Outpatient Visits          1 week ago Sore throat   [facility] Steele Sizer, MD   2 months ago Dyslipidemia associated with type 2 diabetes mellitus [facility])   [facility] Steele Sizer, MD   4 months ago Obstructive apnea   [facility] Steele Sizer, MD   1 year ago Wound of left buttock,  subsequent encounter   [facility] Steele Sizer, MD   1 year ago Diabetes mellitus type 2 in obese [facility])   [place], FNP      Future Appointments            In 1 month Ancil Boozer, Drue Stager, MD [facility], [place]   In 7 months  [facility], [facility]

## 2020-01-01 ENCOUNTER — Other Ambulatory Visit: Payer: Self-pay | Admitting: Family Medicine

## 2020-01-01 DIAGNOSIS — M5136 Other intervertebral disc degeneration, lumbar region: Secondary | ICD-10-CM

## 2020-01-01 NOTE — Telephone Encounter (Signed)
Requested Prescriptions  Pending Prescriptions Disp Refills  . gabapentin (NEURONTIN) 100 MG capsule [Pharmacy Med Name: GABAPENTIN CAPS 100MG] 180 capsule 3    Sig: TAKE 2 CAPSULES AT BEDTIME     Neurology: Anticonvulsants - gabapentin Passed - 01/01/2020 12:13 PM      Passed - Valid encounter within last 12 months    Recent Outpatient Visits          3 weeks ago Sore throat   [facility] Steele Sizer, MD   2 months ago Dyslipidemia associated with type 2 diabetes mellitus [facility])   [facility] Steele Sizer, MD   5 months ago Obstructive apnea   [facility] Steele Sizer, MD   1 year ago Wound of left buttock, subsequent encounter   [facility] Steele Sizer, MD   1 year ago Diabetes mellitus type 2 in obese [facility])   [place], [place]      Future Appointments            In 3 weeks Steele Sizer, MD [facility], [place]   In 6 months  [facility], [facility]

## 2020-01-10 ENCOUNTER — Telehealth: Payer: Self-pay

## 2020-01-10 ENCOUNTER — Encounter: Payer: Self-pay | Admitting: Family Medicine

## 2020-01-10 NOTE — Telephone Encounter (Signed)
Copied from [place] [id number]. Topic: General - Other >> Jan 10, 2020  1:23 PM Rainey Pines A wrote: Patient stated that she has been waiting 2 weeks for a callback on if it is time for her shingles vaccine. Please advise   Called patient to inform her that she does need both Shingrix vaccines.

## 2020-01-18 ENCOUNTER — Encounter: Payer: Self-pay | Admitting: Family Medicine

## 2020-01-28 ENCOUNTER — Ambulatory Visit (INDEPENDENT_AMBULATORY_CARE_PROVIDER_SITE_OTHER): Payer: Medicare Other | Admitting: Family Medicine

## 2020-01-28 ENCOUNTER — Other Ambulatory Visit: Payer: Self-pay

## 2020-01-28 ENCOUNTER — Encounter: Payer: Self-pay | Admitting: Family Medicine

## 2020-01-28 VITALS — BP 124/80 | HR 93 | Temp 98.6°F | Resp 16 | Ht 66.0 in | Wt 190.4 lb

## 2020-01-28 DIAGNOSIS — R251 Tremor, unspecified: Secondary | ICD-10-CM

## 2020-01-28 DIAGNOSIS — E1169 Type 2 diabetes mellitus with other specified complication: Secondary | ICD-10-CM

## 2020-01-28 DIAGNOSIS — N1831 Chronic kidney disease, stage 3a: Secondary | ICD-10-CM

## 2020-01-28 DIAGNOSIS — G4733 Obstructive sleep apnea (adult) (pediatric): Secondary | ICD-10-CM

## 2020-01-28 DIAGNOSIS — I10 Essential (primary) hypertension: Secondary | ICD-10-CM

## 2020-01-28 DIAGNOSIS — R0982 Postnasal drip: Secondary | ICD-10-CM

## 2020-01-28 DIAGNOSIS — E669 Obesity, unspecified: Secondary | ICD-10-CM | POA: Diagnosis not present

## 2020-01-28 DIAGNOSIS — N183 Chronic kidney disease, stage 3 unspecified: Secondary | ICD-10-CM

## 2020-01-28 DIAGNOSIS — E785 Hyperlipidemia, unspecified: Secondary | ICD-10-CM

## 2020-01-28 DIAGNOSIS — I6522 Occlusion and stenosis of left carotid artery: Secondary | ICD-10-CM | POA: Diagnosis not present

## 2020-01-28 DIAGNOSIS — I129 Hypertensive chronic kidney disease with stage 1 through stage 4 chronic kidney disease, or unspecified chronic kidney disease: Secondary | ICD-10-CM

## 2020-01-28 DIAGNOSIS — L4050 Arthropathic psoriasis, unspecified: Secondary | ICD-10-CM

## 2020-01-28 LAB — POCT GLYCOSYLATED HEMOGLOBIN (HGB A1C): Hemoglobin A1C: 7.8 % — AB (ref 4.0–5.6)

## 2020-01-28 MED ORDER — GLIPIZIDE ER 2.5 MG PO TB24: 2.5000 mg | ORAL_TABLET | Freq: Every morning | ORAL | 1 refills | Status: DC

## 2020-01-28 MED ORDER — IPRATROPIUM BROMIDE 0.03 % NA SOLN: 2.0000 | Freq: Two times a day (BID) | NASAL | 1 refills | Status: DC

## 2020-01-28 MED ORDER — GLIPIZIDE ER 5 MG PO TB24: 5.0000 mg | ORAL_TABLET | Freq: Every morning | ORAL | 1 refills | Status: DC

## 2020-01-28 NOTE — Progress Notes (Signed)
Name: Mary Cantrell   MRN: [id number]    DOB: [birth year]   Date:01/28/2020       Progress Note  Subjective  Chief Complaint  Chief Complaint  Patient presents with  . Sore Throat    She has been having recurrent sore throats in the morning. She has been evaluated by ENT. Dx with nasal drainage. Rx Atrovent. She would like to switch to Flonase at night instead.  . Diabetes  . Hypertension  . Hyperlipidemia  . Hypothyroidism    HPI   DMII: last A1C was 7.1% and today it is 7.8% She denies polyphagia, polydipsia or polyuria, she is compliant with medication. Taking Metformin and glipizide . Last GFR was back to normal. She started to have chocolate at night, because it controlled her nocturia , but A1C has gone up. She asked to stay here for now, afraid of going to [facility] since [facility] variant is up . She states she will eliminate nocturnal chocolate again . Urine micro and lipid due for repeat this Fall. She has associated obesity, dyslipidemia   OSA: on CPAP : wears it at 99 % of the time - goes to bed wearing it but sometimes removes during the night,  she denies day time somnolence or headaches. Unchanged   Hyperlipidemia: reviewed labs done by Endo 04/19/2019 showed LDL 87, HDL 60.4. She is on Pravastatin , LDL goal is below 70. We tried to adjust dose but she could not tolerate the higher dose, she is supposed to be on Rosuvastatin but she is still taking 40 mg Pravastatin that she was taking previously . We will recheck labs and if needed advised to fill the Rosuvastatin instead  Mild Intermittent Asthma: under the care of Dr. Raul Del , she is on Symbicort and albuterol prn, she has stable SOB with activity and likely multifactorial. Immunizations up to date Last visit was in April and is doing fine   Hypertension with CKI stage III: she is on ARB and Aldactazide half pill daily. No chest pain or palpitation    Psoriasis: on her elbows, uses topical medication prn   PMR:  still under the care of Rheumatologist, Dr. Dossie Der  She also has OA also psoriatic arthritis and is on methotrexate , she states unable to go down on the dose because pain increases   New onset tremors: noticed about one month ago. Only with movement, difficulty treading a needle , also when doing her nails, affecting her ability to type - gets the wrong key. No other neuro deficit No other neuro deficit    Patient Active Problem List   Diagnosis Date Noted  . Chronic kidney disease, stage III (moderate) 12/09/2018  . Dyslipidemia associated with type 2 diabetes mellitus ([place]) 12/09/2018  . Obesity (BMI 30.0-34.9) 07/17/2018  . Hypertension associated with stage 2 chronic kidney disease due to type 2 diabetes mellitus ([place]) 03/15/2018  . Psoriatic arthritis ([place]) 12/04/2017  . Stenosis of left carotid artery 12/16/2016  . Polymyalgia rheumatica ([place]) 04/13/2015  . Benign hypertension with chronic kidney disease, stage III ([place]) 12/21/2014  . Back pain, chronic 12/21/2014  . Narrowing of intervertebral disc space 12/21/2014  . Diabetic gastroparesis ([place]) 12/21/2014  . Amaurosis fugax of left eye 12/21/2014  . H/O: hypothyroidism 12/21/2014  . Hyperlipidemia 12/21/2014  . IBS (irritable bowel syndrome) 12/21/2014  . Disorder of macula of retina 12/21/2014  . Asthma, moderate 12/21/2014  . NASH (nonalcoholic steatohepatitis) 12/21/2014  . NS (nuclear sclerosis) 12/21/2014  . Osteoarthrosis  12/21/2014  . Psoriasis 12/21/2014  . Knee torn cartilage 12/21/2014  . Closed dislocation of jaw 12/21/2014  . Diabetes mellitus type 2 in obese ([place]) 12/21/2014  . Obstructive apnea 01/03/2014  . History of shoulder surgery 04/23/2011    Past Surgical History:  Procedure Laterality Date  . ABDOMINAL HYSTERECTOMY    . APPENDECTOMY    . ARTERY BIOPSY N/A 02/23/2015   Procedure: BIOPSY TEMPORAL ARTERY;  Surgeon: Algernon Huxley, MD;  Location: [facility] ORS;  Service: Vascular;  Laterality: N/A;  . BREAST  BIOPSY Left   . EXCISION / CURETTAGE BONE CYST FINGER     Left Index Finger  . PILONIDAL CYST EXCISION    . SHOULDER ARTHROSCOPY Right [id number]   Dr. Mauri Pole  . STOMACH SURGERY  1941   3 arteries leaking in stomach after appendectomy  . TONSILLECTOMY      Family History  Problem Relation Age of Onset  . Rheum arthritis Father   . Diabetes Father   . Pneumonia Father   . Pneumonia Mother   . Diabetes Sister   . Osteoarthritis Sister     Social History   Tobacco Use  . Smoking status: Never Smoker  . Smokeless tobacco: Never Used  Substance Use Topics  . Alcohol use: No    Alcohol/week: 0.0 standard drinks     Current Outpatient Medications:  .  albuterol (VENTOLIN HFA) 108 (90 BASE) MCG/ACT inhaler, Inhale 2 puffs into the lungs every 4 (four) hours as needed for wheezing (takes every am and then as needed.). , Disp: , Rfl:  .  aspirin 81 MG tablet, Take 81 mg by mouth daily. , Disp: , Rfl:  .  calcipotriene-betamethasone (TACLONEX) ointment, Apply topically daily., Disp: , Rfl:  .  Calcium Carbonate-Vit D-Min ([employer] CALCIUM 600 + D/MINERALS) 600-400 MG-UNIT TABS, Take 1 tablet by mouth daily., Disp: , Rfl: 1 .  cholecalciferol (VITAMIN D3) 25 MCG (1000 UNIT) tablet, Take 1,000 Units by mouth daily., Disp: , Rfl:  .  Cinnamon 500 MG capsule, Take 1,000 mg by mouth daily. , Disp: , Rfl:  .  cyanocobalamin 100 MCG tablet, Take 100 mcg by mouth 2 (two) times a week. , Disp: , Rfl:  .  Cysteamine Bitartrate (PROCYSBI) 300 MG PACK, Use 1 each 3 (three) times daily Use as instructed., Disp: , Rfl:  .  dicyclomine (BENTYL) 20 MG tablet, TAKE 1 TABLET FOUR TIMES A DAY BEFORE MEALS AND AT BEDTIME (Patient taking differently: Take 40 mg by mouth daily. ), Disp: 100 tablet, Rfl: 5 .  fluticasone (FLONASE) 50 MCG/ACT nasal spray, USE 2 SPRAYS IN EACH NOSTRIL AT BEDTIME, Disp: 48 g, Rfl: 3 .  folic acid (FOLVITE) 1 MG tablet, Take 1 mg by mouth daily. , Disp: , Rfl:  .  gabapentin  (NEURONTIN) 100 MG capsule, TAKE 2 CAPSULES AT BEDTIME, Disp: 180 capsule, Rfl: 3 .  glipiZIDE (GLUCOTROL XL) 2.5 MG 24 hr tablet, Take 1 tablet (2.5 mg total) by mouth every morning., Disp: 90 tablet, Rfl: 1 .  glipiZIDE (GLUCOTROL XL) 5 MG 24 hr tablet, Take 1 tablet (5 mg total) by mouth every morning., Disp: 90 tablet, Rfl: 1 .  metFORMIN (GLUCOPHAGE-XR) 500 MG 24 hr tablet, TAKE 3 TABLETS (1500 MG) DAILY (Patient taking differently: Take 1,500 mg by mouth every evening. ), Disp: 270 tablet, Rfl: 3 .  methotrexate (RHEUMATREX) 2.5 MG tablet, Take 6 tablets (15 mg total) by mouth once a week. Caution:Chemotherapy. Protect from light. (Patient taking  differently: Take 20 mg by mouth once a week. Caution:Chemotherapy. Protect from light.), Disp: , Rfl:  .  montelukast (SINGULAIR) 10 MG tablet, Take 10 mg by mouth every evening., Disp: , Rfl:  .  Multiple Vitamins-Minerals (MULTIVITAMIN ADULTS PO), Take 1 tablet by mouth daily. , Disp: , Rfl:  .  olmesartan (BENICAR) 20 MG tablet, TAKE 1 TABLET DAILY, Disp: 90 tablet, Rfl: 1 .  ONE TOUCH ULTRA TEST test strip, TEST 2 (TWO) TIMES DAILY. DX: E11.69, E11.43. *INS.ONLY PAYS FOR ONCE DAILY TESTING*, Disp: 100 each, Rfl: 2 .  predniSONE (DELTASONE) 5 MG tablet, Take 5 mg by mouth daily., Disp: , Rfl:  .  Propylene Glycol (SYSTANE BALANCE OP), Apply to eye. Only as needed, Disp: , Rfl:  .  rosuvastatin (CRESTOR) 20 MG tablet, Take 1 tablet (20 mg total) by mouth daily. In place of pravastatin, Disp: 90 tablet, Rfl: 0 .  spironolactone-hydrochlorothiazide (ALDACTAZIDE) 25-25 MG tablet, TAKE ONE-HALF (1/2) TABLET DAILY, Disp: 45 tablet, Rfl: 1 .  vitamin C (ASCORBIC ACID) 500 MG tablet, Take 1 tablet by mouth at bedtime. , Disp: , Rfl:  .  budesonide-formoterol (SYMBICORT) 160-4.5 MCG/ACT inhaler, Inhale 2 puffs into the lungs every morning., Disp: , Rfl:  .  ipratropium (ATROVENT) 0.03 % nasal spray, Place 2 sprays into both nostrils every 12 (twelve) hours.,  Disp: 90 mL, Rfl: 1  Allergies  Allergen Reactions  . Doxycycline Other (See Comments)    Weakness, flu like symptoms  . Codeine     Other reaction(s): Other (See Comments) Dizzy  . Lantus  [Insulin Glargine]     made skin hot  . Metformin     Coated metformin is tolerated well.  Marland Kitchen Penicillin G     Other reaction(s): Unknown  . Sitagliptin   . Sulfa Antibiotics     Other reaction(s): Other (See Comments) Red burning skin    I personally reviewed active problem list, medication list, allergies, family history, social history, health maintenance with the patient/caregiver today.   ROS  Constitutional: Negative for fever or weight change.  Respiratory: Negative for cough , she has intermittent  shortness of breath.   Cardiovascular: Negative for chest pain or palpitations.  Gastrointestinal: Negative for abdominal pain, no bowel changes.  Musculoskeletal: Negative for gait problem or joint swelling.  Skin: positive for rash.  Neurological: Negative for dizziness or headache. Tremors  No other specific complaints in a complete review of systems (except as listed in HPI above).  Objective  Vitals:   01/28/20 1527  BP: 124/80  Pulse: 93  Resp: 16  Temp: 98.6 F (37 C)  TempSrc: Temporal  SpO2: 96%  Weight: 190 lb 6.4 oz (86.4 kg)  Height: 5' 6"  (1.676 m)    Body mass index is 30.73 kg/m.  Physical Exam  Constitutional: Patient appears well-developed and well-nourished. Obese  No distress.  HEENT: head atraumatic, normocephalic, pupils equal and reactive to light, neck supple Cardiovascular: Normal rate, regular rhythm and normal heart sounds.  No murmur heard. No BLE edema. Pulmonary/Chest: Effort normal and breath sounds normal. No respiratory distress. Abdominal: Soft.  There is no tenderness. Neurological : romberg negative, normal cranial nerves, no tremors during exam, normal alternate exam  Psychiatric: Patient has a normal mood and affect. behavior is  normal. Judgment and thought content normal.  Recent Results (from the past 2160 hour(s))  POCT HgB A1C     Status: Abnormal   Collection Time: 01/28/20  3:44 PM  Result Value Ref  Range   Hemoglobin A1C 7.8 (A) 4.0 - 5.6 %   HbA1c POC (<> result, manual entry)     HbA1c, POC (prediabetic range)     HbA1c, POC (controlled diabetic range)        PHQ2/9: Depression screen [facility] 2/9 01/28/2020 12/08/2019 10/11/2019 07/26/2019 07/23/2019  Decreased Interest 0 0 0 0 0  Down, Depressed, Hopeless 0 0 0 0 0  PHQ - 2 Score 0 0 0 0 0  Altered sleeping 0 0 0 0 -  Tired, decreased energy 0 1 0 0 -  Change in appetite 0 0 0 0 -  Feeling bad or failure about yourself  0 0 0 0 -  Trouble concentrating 0 0 0 0 -  Moving slowly or fidgety/restless 0 0 0 0 -  Suicidal thoughts 0 0 0 0 -  PHQ-9 Score 0 1 0 0 -  Difficult doing work/chores - Not difficult at all - Not difficult at all -  Some recent data might be hidden    phq 9 is negative   Fall Risk: Fall Risk  01/28/2020 12/08/2019 10/11/2019 07/26/2019 07/23/2019  Falls in the past year? 0 0 0 0 0  Comment - - - - -  Number falls in past yr: 0 - 0 0 0  Injury with Fall? 0 - 0 0 0  Comment - - - - -  Risk for fall due to : - - - - No Fall Risks  Follow up - - - - Falls prevention discussed     Functional Status Survey: Is the patient deaf or have difficulty hearing?: No Does the patient have difficulty seeing, even when wearing glasses/contacts?: No Does the patient have difficulty concentrating, remembering, or making decisions?: No Does the patient have difficulty walking or climbing stairs?: No Does the patient have difficulty dressing or bathing?: No Does the patient have difficulty doing errands alone such as visiting a doctor's office or shopping?: No    Assessment & Plan  1. Diabetes mellitus type 2 in obese (HCC)  - POCT HgB A1C - glipiZIDE (GLUCOTROL XL) 5 MG 24 hr tablet; Take 1 tablet (5 mg total) by mouth every morning.   Dispense: 90 tablet; Refill: 1 - glipiZIDE (GLUCOTROL XL) 2.5 MG 24 hr tablet; Take 1 tablet (2.5 mg total) by mouth every morning.  Dispense: 90 tablet; Refill: 1  2. Hypertension, benign  - Comprehensive metabolic panel  3. Dyslipidemia associated with type 2 diabetes mellitus (HCC)  - Lipid panel  4. Obstructive apnea    5. Benign hypertension with chronic kidney disease, stage III   6. Psoriatic arthritis (HCC)   7. Stage 3a chronic kidney disease  - Microalbumin / creatinine urine ratio - Comprehensive metabolic panel  8. Dyslipidemia   9. Post-nasal drainage  - ipratropium (ATROVENT) 0.03 % nasal spray; Place 2 sprays into both nostrils every 12 (twelve) hours.  Dispense: 90 mL; Refill: 1  10. Tremors of nervous system  - Ambulatory referral to Neurology

## 2020-01-30 ENCOUNTER — Encounter: Payer: Self-pay | Admitting: Family Medicine

## 2020-02-03 LAB — COMPREHENSIVE METABOLIC PANEL
ALT: 22 IU/L (ref 0–32)
AST: 23 IU/L (ref 0–40)
Albumin/Globulin Ratio: 2.9 — ABNORMAL HIGH (ref 1.2–2.2)
Albumin: 4.6 g/dL (ref 3.7–4.7)
Alkaline Phosphatase: 81 IU/L (ref 48–121)
BUN/Creatinine Ratio: 16 (ref 12–28)
BUN: 18 mg/dL (ref 8–27)
Bilirubin Total: 0.4 mg/dL (ref 0.0–1.2)
CO2: 24 mmol/L (ref 20–29)
Calcium: 9.9 mg/dL (ref 8.7–10.3)
Chloride: 101 mmol/L (ref 96–106)
Creatinine, Ser: 1.12 mg/dL — ABNORMAL HIGH (ref 0.57–1.00)
GFR calc Af Amer: 56 mL/min/{1.73_m2} — ABNORMAL LOW (ref >59)
GFR calc non Af Amer: 48 mL/min/{1.73_m2} — ABNORMAL LOW (ref >59)
Globulin, Total: 1.6 g/dL (ref 1.5–4.5)
Glucose: 176 mg/dL — ABNORMAL HIGH (ref 65–99)
Potassium: 3.7 mmol/L (ref 3.5–5.2)
Sodium: 142 mmol/L (ref 134–144)
Total Protein: 6.2 g/dL (ref 6.0–8.5)

## 2020-02-03 LAB — LIPID PANEL
Chol/HDL Ratio: 2.6 ratio (ref 0.0–4.4)
Cholesterol, Total: 177 mg/dL (ref 100–199)
HDL: 69 mg/dL (ref >39)
LDL Chol Calc (NIH): 77 mg/dL (ref 0–99)
Triglycerides: 190 mg/dL — ABNORMAL HIGH (ref 0–149)
VLDL Cholesterol Cal: 31 mg/dL (ref 5–40)

## 2020-02-03 LAB — MICROALBUMIN / CREATININE URINE RATIO
Creatinine, Urine: 91 mg/dL
Microalb/Creat Ratio: 8 mg/g creat (ref 0–29)
Microalbumin, Urine: 6.9 ug/mL

## 2020-02-09 ENCOUNTER — Encounter: Payer: Self-pay | Admitting: Family Medicine

## 2020-02-27 DIAGNOSIS — R278 Other lack of coordination: Secondary | ICD-10-CM | POA: Insufficient documentation

## 2020-02-27 DIAGNOSIS — G629 Polyneuropathy, unspecified: Secondary | ICD-10-CM | POA: Insufficient documentation

## 2020-02-27 DIAGNOSIS — R251 Tremor, unspecified: Secondary | ICD-10-CM | POA: Insufficient documentation

## 2020-02-28 ENCOUNTER — Other Ambulatory Visit: Payer: Self-pay | Admitting: Family Medicine

## 2020-02-28 DIAGNOSIS — E1169 Type 2 diabetes mellitus with other specified complication: Secondary | ICD-10-CM

## 2020-02-29 ENCOUNTER — Ambulatory Visit
Admission: RE | Admit: 2020-02-29 | Discharge: 2020-02-29 | Disposition: A | Discharge status: Home / Self Care | Payer: Medicare Other | Source: Ambulatory Visit | Attending: Family Medicine | Admitting: Family Medicine

## 2020-02-29 ENCOUNTER — Other Ambulatory Visit: Payer: Self-pay

## 2020-02-29 DIAGNOSIS — S92902D Unspecified fracture of left foot, subsequent encounter for fracture with routine healing: Secondary | ICD-10-CM | POA: Insufficient documentation

## 2020-02-29 DIAGNOSIS — Z1231 Encounter for screening mammogram for malignant neoplasm of breast: Secondary | ICD-10-CM

## 2020-02-29 DIAGNOSIS — Z7952 Long term (current) use of systemic steroids: Secondary | ICD-10-CM | POA: Diagnosis present

## 2020-04-04 ENCOUNTER — Ambulatory Visit: Payer: Medicare Other | Attending: Critical Care Medicine

## 2020-04-04 DIAGNOSIS — Z23 Encounter for immunization: Secondary | ICD-10-CM

## 2020-04-04 NOTE — Progress Notes (Signed)
   Covid-19 Vaccination Clinic  Name:  Mary Cantrell    MRN: [id number] DOB: 03-10-1946  04/04/2020  Mary Cantrell was observed post Covid-19 immunization for 15 minutes without incident. She was provided with Vaccine Information Sheet and instruction to access the V-Safe system.   Mary Cantrell was instructed to call 911 with any severe reactions post vaccine: Marland Kitchen Difficulty breathing  . Swelling of face and throat  . A fast heartbeat  . A bad rash all over body  . Dizziness and weakness

## 2020-04-09 ENCOUNTER — Encounter: Payer: Self-pay | Admitting: Family Medicine

## 2020-04-20 ENCOUNTER — Other Ambulatory Visit: Payer: Self-pay | Admitting: Family Medicine

## 2020-04-20 DIAGNOSIS — K580 Irritable bowel syndrome with diarrhea: Secondary | ICD-10-CM

## 2020-04-20 NOTE — Telephone Encounter (Signed)
Requested medication (s) are due for refill today- depend on how patient is taking medication  Requested medication (s) are on the active medication list -yes  Future visit scheduled -yes  Last refill: 03/17/20  Notes to clinic: Patient reports taking medication differently than prescribed- 40 mg per day- sent for review   Requested Prescriptions  Pending Prescriptions Disp Refills   dicyclomine (BENTYL) 20 MG tablet [Pharmacy Med Name: DICYCLOMINE HCL TABS 20MG] 100 tablet 13    Sig: TAKE 1 TABLET FOUR TIMES A DAY BEFORE MEALS AND AT BEDTIME      Gastroenterology:  Antispasmodic Agents Passed - 04/20/2020  1:30 PM      Passed - Last Heart Rate in normal range    Pulse Readings from Last 1 Encounters:  01/28/20 93          Passed - Valid encounter within last 12 months    Recent Outpatient Visits           2 months ago Diabetes mellitus type 2 in obese [facility])   [facility] Steele Sizer, MD   4 months ago Sore throat   [facility] [place], Drue Stager, MD   6 months ago Dyslipidemia associated with type 2 diabetes mellitus [facility])   [facility] Steele Sizer, MD   8 months ago Obstructive apnea   [facility] Steele Sizer, MD   1 year ago Wound of left buttock, subsequent encounter   [facility] Steele Sizer, MD       Future Appointments             In 1 month Ancil Boozer, Drue Stager, MD [facility] [place]   In 3 months  [facility]                Requested Prescriptions  Pending Prescriptions Disp Refills   dicyclomine (BENTYL) 20 MG tablet [Pharmacy Med Name: DICYCLOMINE HCL TABS 20MG] 100 tablet 13    Sig: TAKE 1 TABLET FOUR TIMES A DAY BEFORE MEALS AND AT BEDTIME      Gastroenterology:  Antispasmodic Agents Passed - 04/20/2020  1:30 PM      Passed - Last Heart Rate in normal range    Pulse Readings from Last 1  Encounters:  01/28/20 93          Passed - Valid encounter within last 12 months    Recent Outpatient Visits           2 months ago Diabetes mellitus type 2 in obese [facility])   [facility] Steele Sizer, MD   4 months ago Sore throat   [facility], Drue Stager, MD   6 months ago Dyslipidemia associated with type 2 diabetes mellitus [facility])   [facility] Steele Sizer, MD   8 months ago Obstructive apnea   [facility] Steele Sizer, MD   1 year ago Wound of left buttock, subsequent encounter   [facility] Steele Sizer, MD       Future Appointments             In 1 month Ancil Boozer, Drue Stager, MD [facility], [place]   In 3 months  [place]

## 2020-04-21 ENCOUNTER — Other Ambulatory Visit: Payer: Self-pay

## 2020-04-24 DIAGNOSIS — M79604 Pain in right leg: Secondary | ICD-10-CM | POA: Insufficient documentation

## 2020-05-30 NOTE — Progress Notes (Unsigned)
Name: Mary Cantrell   MRN: [id number]    DOB: 1946-05-20   Date:05/31/2020       Progress Note  Subjective  Chief Complaint  Follow Up  HPI  DMII: last A1C was 7.1% it went to 7.8% and now it is 7.5 %  She denies polyphagia, polydipsia or polyuria, she is compliant with medication. Taking Metformin 1500 mg  and glipizide 7.5 mg  . She has associated obesity, dyslipidemia, chronic kidney disease, urine micro was normal 02/02/2020  , she states she has been eating healthy, but has not been very active because of her hip pain   OSA: on CPAP : wears it at 99 % of the time - goes to bed wearing it but sometimes removes during the night,  she denies day time somnolence or headaches. Doing well   Hyperlipidemia: reviewed labs done by Endo 04/19/2019 showed LDL 87, HDL 60.4. She was on Pravastatin , LDL goal is below 70. We tried to adjust dose but she could not tolerate the higher dose, we switched to Crestor but unable to tolerate it, she is back on Pravastatin 40 mg and is compliant, LDL close to goal at 77, discussed Zetia but she wants to hold off for now   Mild Intermittent Asthma: under the care of Dr. Raul Del , she is on Symbicort and albuterol prn, she has stable SOB with activity and likely multifactorial. Shots are up to date   Hypertension with CKI stage III: she is on ARB and Aldactazide half pill daily. No chest pain or palpitation, denies dizziness   Psoriasis: on her elbows, uses topical medication prn, unchanged     PMR: still under the care of Rheumatologist, Dr. Dossie Der  She also has OA also psoriatic arthritis and is on methotrexate , she states unable to go down on the dose because pain increases, she also takes prednisone 7.5 mg daily to control symptoms    New onset tremors: she is under the care of Dr. Melrose Nakayama, she has tremors, balanced problems, sensory ataxia  and is taking gabapentin but not sure if it is helping  Claudication:   Patient Active Problem List   Diagnosis  Date Noted  . Right leg pain 04/24/2020  . Neuropathy 02/27/2020  . 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 Tremor 02/27/2020  . Chronic kidney disease, stage III (moderate) ([place]) 12/09/2018  . Dyslipidemia associated with type 2 diabetes mellitus ([place]) 12/09/2018  . Obesity (BMI 30.0-34.9) 07/17/2018  . Hypertension associated with stage 2 chronic kidney disease due to type 2 diabetes mellitus ([place]) 03/15/2018  . Psoriatic arthritis ([place]) 12/04/2017  . Stenosis of left carotid artery 12/16/2016  . Polymyalgia rheumatica ([place]) 04/13/2015  . Benign hypertension with chronic kidney disease, stage III ([place]) 12/21/2014  . Back pain, chronic 12/21/2014  . Narrowing of intervertebral disc space 12/21/2014  . Diabetic gastroparesis ([place]) 12/21/2014  . Amaurosis fugax of left eye 12/21/2014  . H/O: hypothyroidism 12/21/2014  . Hyperlipidemia 12/21/2014  . IBS (irritable bowel syndrome) 12/21/2014  . Disorder of macula of retina 12/21/2014  . Asthma, moderate 12/21/2014  . NASH (nonalcoholic steatohepatitis) 12/21/2014  . NS (nuclear sclerosis) 12/21/2014  . Osteoarthrosis 12/21/2014  . Psoriasis 12/21/2014  . Knee torn cartilage 12/21/2014  . Closed dislocation of jaw 12/21/2014  . Diabetes mellitus type 2 in obese ([place]) 12/21/2014  . Obstructive apnea 01/03/2014  . History of shoulder surgery 04/23/2011    Past Surgical History:  Procedure Laterality Date  . ABDOMINAL HYSTERECTOMY    .  APPENDECTOMY    . ARTERY BIOPSY N/A 02/23/2015   Procedure: BIOPSY TEMPORAL ARTERY;  Surgeon: Algernon Huxley, MD;  Location: [facility] ORS;  Service: Vascular;  Laterality: N/A;  . BREAST BIOPSY Left    1990's  . EXCISION / CURETTAGE BONE CYST FINGER     Left Index Finger  . PILONIDAL CYST EXCISION    . SHOULDER ARTHROSCOPY Right [id number]   Dr. Mauri Pole  . STOMACH SURGERY  1941   3 arteries leaking in stomach after appendectomy  . TONSILLECTOMY      Family History  Problem Relation Age of Onset  . Rheum  arthritis Father   . Diabetes Father   . Pneumonia Father   . Pneumonia Mother   . Diabetes Sister   . Osteoarthritis Sister   . Breast cancer Neg Hx     Social History   Tobacco Use  . Smoking status: Never Smoker  . Smokeless tobacco: Never Used  Substance Use Topics  . Alcohol use: No    Alcohol/week: 0.0 standard drinks     Current Outpatient Medications:  .  methotrexate (RHEUMATREX) 2.5 MG tablet, Take 20 mg by mouth once a week. Caution:Chemotherapy. Protect from light., Disp: , Rfl:  .  albuterol (VENTOLIN HFA) 108 (90 BASE) MCG/ACT inhaler, Inhale 2 puffs into the lungs every 4 (four) hours as needed for wheezing (takes every am and then as needed.). , Disp: , Rfl:  .  aspirin 81 MG tablet, Take 81 mg by mouth daily. , Disp: , Rfl:  .  budesonide-formoterol (SYMBICORT) 160-4.5 MCG/ACT inhaler, Inhale 2 puffs into the lungs every morning., Disp: , Rfl:  .  calcipotriene-betamethasone (TACLONEX) ointment, Apply topically daily., Disp: , Rfl:  .  Calcium Carbonate-Vit D-Min ([employer] CALCIUM 600 + D/MINERALS) 600-400 MG-UNIT TABS, Take 1 tablet by mouth daily., Disp: , Rfl: 1 .  cholecalciferol (VITAMIN D3) 25 MCG (1000 UNIT) tablet, Take 1,000 Units by mouth daily., Disp: , Rfl:  .  Cinnamon 500 MG capsule, Take 1,000 mg by mouth daily. , Disp: , Rfl:  .  cyanocobalamin 100 MCG tablet, Take 100 mcg by mouth 2 (two) times a week. , Disp: , Rfl:  .  Cysteamine Bitartrate (PROCYSBI) 300 MG PACK, Use 1 each 3 (three) times daily Use as instructed., Disp: , Rfl:  .  dicyclomine (BENTYL) 20 MG tablet, TAKE 1 TABLET FOUR TIMES A DAY BEFORE MEALS AND AT BEDTIME (Patient taking differently: Take 40 mg by mouth daily. ), Disp: 100 tablet, Rfl: 5 .  fluticasone (FLONASE) 50 MCG/ACT nasal spray, USE 2 SPRAYS IN EACH NOSTRIL AT BEDTIME, Disp: 48 g, Rfl: 3 .  folic acid (FOLVITE) 1 MG tablet, Take 1 mg by mouth daily. , Disp: , Rfl:  .  gabapentin (NEURONTIN) 300 MG capsule, Take 1 capsule by  mouth 2 (two) times daily., Disp: , Rfl:  .  glipiZIDE (GLUCOTROL XL) 2.5 MG 24 hr tablet, Take 1 tablet (2.5 mg total) by mouth every morning., Disp: 90 tablet, Rfl: 1 .  glipiZIDE (GLUCOTROL XL) 5 MG 24 hr tablet, Take 1 tablet (5 mg total) by mouth every morning., Disp: 90 tablet, Rfl: 1 .  ipratropium (ATROVENT) 0.03 % nasal spray, Place 2 sprays into both nostrils every 12 (twelve) hours., Disp: 90 mL, Rfl: 1 .  metFORMIN (GLUCOPHAGE XR) 750 MG 24 hr tablet, Take 2 tablets (1,500 mg total) by mouth daily with breakfast., Disp: 180 tablet, Rfl: 1 .  montelukast (SINGULAIR) 10 MG tablet, Take 10  mg by mouth every evening., Disp: , Rfl:  .  Multiple Vitamins-Minerals (MULTIVITAMIN ADULTS PO), Take 1 tablet by mouth daily. , Disp: , Rfl:  .  olmesartan (BENICAR) 20 MG tablet, TAKE 1 TABLET DAILY, Disp: 90 tablet, Rfl: 1 .  ONE TOUCH ULTRA TEST test strip, TEST 2 (TWO) TIMES DAILY. DX: E11.69, E11.43. *INS.ONLY PAYS FOR ONCE DAILY TESTING*, Disp: 100 each, Rfl: 2 .  pravastatin (PRAVACHOL) 40 MG tablet, Take 1 tablet (40 mg total) by mouth daily. Cannot tolerate 80 mg, Disp: 90 tablet, Rfl: 1 .  predniSONE (DELTASONE) 2.5 MG tablet, Take 2.5 mg by mouth daily., Disp: , Rfl:  .  predniSONE (DELTASONE) 5 MG tablet, Take 5 mg by mouth daily., Disp: , Rfl:  .  Propylene Glycol (SYSTANE BALANCE OP), Apply to eye. Only as needed, Disp: , Rfl:  .  spironolactone-hydrochlorothiazide (ALDACTAZIDE) 25-25 MG tablet, Take 0.5 tablets by mouth daily., Disp: 45 tablet, Rfl: 1 .  vitamin C (ASCORBIC ACID) 500 MG tablet, Take 1 tablet by mouth at bedtime. , Disp: , Rfl:   Allergies  Allergen Reactions  . Doxycycline Other (See Comments)    Weakness, flu like symptoms  . Codeine     Other reaction(s): Other (See Comments) Dizzy  . Lantus  [Insulin Glargine]     made skin hot  . Penicillin G     Other reaction(s): Unknown  . Rosuvastatin     Myalgia   . Sitagliptin   . Sulfa Antibiotics     Other  reaction(s): Other (See Comments) Red burning skin  . Wound Dressing Adhesive Rash    Surgical tape adhesive    I personally reviewed active problem list, medication list, allergies, family history, social history, health maintenance, notes from last encounter with the patient/caregiver today.   ROS   Constitutional: Negative for fever or weight change.  Respiratory: Negative for cough, stable shortness of breath.   Cardiovascular: Negative for chest pain or palpitations.  Gastrointestinal: Negative for abdominal pain, no bowel changes.  Musculoskeletal: positive  for gait problem currently no  joint swelling.  Skin: Negative for rash.  Neurological: Negative for dizziness or headache.  No other specific complaints in a complete review of systems (except as listed in HPI above).  Objective  Vitals:   05/31/20 1355  BP: 124/76  Pulse: 88  Resp: 16  Temp: 98.3 F (36.8 C)  TempSrc: Oral  SpO2: 99%  Weight: 192 lb (87.1 kg)  Height: 5' 6"  (1.676 m)    Body mass index is 30.99 kg/m.  Physical Exam  Constitutional: Patient appears well-developed and well-nourished. ObeseNo distress.  HEENT: head atraumatic, normocephalic, pupils equal and reactive to light, neck supple Cardiovascular: Normal rate, regular rhythm and normal heart sounds.  No murmur heard. No BLE edema. Pulmonary/Chest: Effort normal and breath sounds normal. No respiratory distress. Abdominal: Soft.  There is no tenderness. Muscular skeletal: no hand deformities, no rashes at this time  Psychiatric: Patient has a normal mood and affect. behavior is normal. Judgment and thought content normal.  Recent Results (from the past 2160 hour(s))  POCT HgB A1C     Status: Abnormal   Collection Time: 05/31/20  1:57 PM  Result Value Ref Range   Hemoglobin A1C 7.5 (A) 4.0 - 5.6 %   HbA1c POC (<> result, manual entry)     HbA1c, POC (prediabetic range)     HbA1c, POC (controlled diabetic range)      Diabetic  Foot Exam: Diabetic Foot  Exam - Simple   Simple Foot Form Diabetic Foot exam was performed with the following findings: Yes 05/31/2020  2:40 PM  Visual Inspection See comments: Yes Sensation Testing Intact to touch and monofilament testing bilaterally: Yes Pulse Check Posterior Tibialis and Dorsalis pulse intact bilaterally: Yes Comments Some corn formation      PHQ2/9: Depression screen [facility] 2/9 05/31/2020 01/28/2020 12/08/2019 10/11/2019 07/26/2019  Decreased Interest 0 0 0 0 0  Down, Depressed, Hopeless 0 0 0 0 0  PHQ - 2 Score 0 0 0 0 0  Altered sleeping - 0 0 0 0  Tired, decreased energy - 0 1 0 0  Change in appetite - 0 0 0 0  Feeling bad or failure about yourself  - 0 0 0 0  Trouble concentrating - 0 0 0 0  Moving slowly or fidgety/restless - 0 0 0 0  Suicidal thoughts - 0 0 0 0  PHQ-9 Score - 0 1 0 0  Difficult doing work/chores - - Not difficult at all - Not difficult at all  Some recent data might be hidden    phq 9 is negative   Fall Risk: Fall Risk  05/31/2020 01/28/2020 12/08/2019 10/11/2019 07/26/2019  Falls in the past year? 0 0 0 0 0  Comment - - - - -  Number falls in past yr: 0 0 - 0 0  Injury with Fall? 0 0 - 0 0  Comment - - - - -  Risk for fall due to : - - - - -  Follow up - - - - -     Functional Status Survey: Is the patient deaf or have difficulty hearing?: Yes Does the patient have difficulty seeing, even when wearing glasses/contacts?: Yes Does the patient have difficulty concentrating, remembering, or making decisions?: No Does the patient have difficulty walking or climbing stairs?: Yes Does the patient have difficulty dressing or bathing?: Yes Does the patient have difficulty doing errands alone such as visiting a doctor's office or shopping?: No    Assessment & Plan  1. Diabetes mellitus type 2 in obese (HCC)  - POCT HgB A1C - metFORMIN (GLUCOPHAGE XR) 750 MG 24 hr tablet; Take 2 tablets (1,500 mg total) by mouth daily with breakfast.   Dispense: 180 tablet; Refill: 1 - glipiZIDE (GLUCOTROL XL) 5 MG 24 hr tablet; Take 1 tablet (5 mg total) by mouth every morning.  Dispense: 90 tablet; Refill: 1 - glipiZIDE (GLUCOTROL XL) 2.5 MG 24 hr tablet; Take 1 tablet (2.5 mg total) by mouth every morning.  Dispense: 90 tablet; Refill: 1  2. Obstructive apnea   3. Benign hypertension with chronic kidney disease, stage III ([place])   4. Dyslipidemia associated with type 2 diabetes mellitus (HCC)  - pravastatin (PRAVACHOL) 40 MG tablet; Take 1 tablet (40 mg total) by mouth daily. Cannot tolerate 80 mg  Dispense: 90 tablet; Refill: 1  5. Psoriatic arthritis ([place])   6. Dyslipidemia  Back on pravastatin   7. Stage 3a chronic kidney disease ([place])   8. Tremors of nervous system  Seeing Dr. Melrose Nakayama   9. Hypertension, benign  - spironolactone-hydrochlorothiazide (ALDACTAZIDE) 25-25 MG tablet; Take 0.5 tablets by mouth daily.  Dispense: 45 tablet; Refill: 1

## 2020-05-31 ENCOUNTER — Encounter: Payer: Self-pay | Admitting: Family Medicine

## 2020-05-31 ENCOUNTER — Other Ambulatory Visit: Payer: Self-pay

## 2020-05-31 ENCOUNTER — Ambulatory Visit (INDEPENDENT_AMBULATORY_CARE_PROVIDER_SITE_OTHER): Payer: Medicare Other | Admitting: Family Medicine

## 2020-05-31 VITALS — BP 124/76 | HR 88 | Temp 98.3°F | Resp 16 | Ht 66.0 in | Wt 192.0 lb

## 2020-05-31 DIAGNOSIS — L4050 Arthropathic psoriasis, unspecified: Secondary | ICD-10-CM | POA: Diagnosis not present

## 2020-05-31 DIAGNOSIS — E669 Obesity, unspecified: Secondary | ICD-10-CM | POA: Diagnosis not present

## 2020-05-31 DIAGNOSIS — G4733 Obstructive sleep apnea (adult) (pediatric): Secondary | ICD-10-CM | POA: Diagnosis not present

## 2020-05-31 DIAGNOSIS — E1169 Type 2 diabetes mellitus with other specified complication: Secondary | ICD-10-CM | POA: Diagnosis not present

## 2020-05-31 DIAGNOSIS — I6522 Occlusion and stenosis of left carotid artery: Secondary | ICD-10-CM

## 2020-05-31 DIAGNOSIS — I129 Hypertensive chronic kidney disease with stage 1 through stage 4 chronic kidney disease, or unspecified chronic kidney disease: Secondary | ICD-10-CM

## 2020-05-31 DIAGNOSIS — R251 Tremor, unspecified: Secondary | ICD-10-CM

## 2020-05-31 DIAGNOSIS — I10 Essential (primary) hypertension: Secondary | ICD-10-CM

## 2020-05-31 DIAGNOSIS — N1831 Chronic kidney disease, stage 3a: Secondary | ICD-10-CM

## 2020-05-31 DIAGNOSIS — E785 Hyperlipidemia, unspecified: Secondary | ICD-10-CM

## 2020-05-31 DIAGNOSIS — N183 Chronic kidney disease, stage 3 unspecified: Secondary | ICD-10-CM

## 2020-05-31 LAB — CBC WITH DIFFERENTIAL/PLATELET
Absolute Monocytes: 720 cells/uL (ref 200–950)
Basophils Absolute: 70 cells/uL (ref 0–200)
Basophils Relative: 0.7 %
Eosinophils Absolute: 80 cells/uL (ref 15–500)
Eosinophils Relative: 0.8 %
HCT: 39 % (ref 35.0–45.0)
Hemoglobin: 13.3 g/dL (ref 11.7–15.5)
Lymphs Abs: 1320 cells/uL (ref 850–3900)
MCH: 32.4 pg (ref 27.0–33.0)
MCHC: 34.1 g/dL (ref 32.0–36.0)
MCV: 94.9 fL (ref 80.0–100.0)
MPV: 9.7 fL (ref 7.5–12.5)
Monocytes Relative: 7.2 %
Neutro Abs: 7810 cells/uL — ABNORMAL HIGH (ref 1500–7800)
Neutrophils Relative %: 78.1 %
Platelets: 309 10*3/uL (ref 140–400)
RBC: 4.11 10*6/uL (ref 3.80–5.10)
RDW: 12.7 % (ref 11.0–15.0)
Total Lymphocyte: 13.2 %
WBC: 10 10*3/uL (ref 3.8–10.8)

## 2020-05-31 LAB — POCT GLYCOSYLATED HEMOGLOBIN (HGB A1C): Hemoglobin A1C: 7.5 % — AB (ref 4.0–5.6)

## 2020-05-31 LAB — COMPLETE METABOLIC PANEL WITH GFR
AG Ratio: 2.1 (calc) (ref 1.0–2.5)
ALT: 18 U/L (ref 6–29)
AST: 17 U/L (ref 10–35)
Albumin: 4.2 g/dL (ref 3.6–5.1)
Alkaline phosphatase (APISO): 68 U/L (ref 37–153)
BUN/Creatinine Ratio: 19 (calc) (ref 6–22)
BUN: 20 mg/dL (ref 7–25)
CO2: 28 mmol/L (ref 20–32)
Calcium: 9.6 mg/dL (ref 8.6–10.4)
Chloride: 103 mmol/L (ref 98–110)
Creat: 1.07 mg/dL — ABNORMAL HIGH (ref 0.60–0.93)
GFR, Est African American: 59 mL/min/{1.73_m2} — ABNORMAL LOW (ref >=60)
GFR, Est Non African American: 51 mL/min/{1.73_m2} — ABNORMAL LOW (ref >=60)
Globulin: 2 g/dL (calc) (ref 1.9–3.7)
Glucose, Bld: 152 mg/dL — ABNORMAL HIGH (ref 65–99)
Potassium: 3.9 mmol/L (ref 3.5–5.3)
Sodium: 140 mmol/L (ref 135–146)
Total Bilirubin: 0.5 mg/dL (ref 0.2–1.2)
Total Protein: 6.2 g/dL (ref 6.1–8.1)

## 2020-05-31 MED ORDER — METFORMIN HCL ER 750 MG PO TB24: 1500.0000 mg | ORAL_TABLET | Freq: Every day | ORAL | 1 refills | Status: DC

## 2020-05-31 MED ORDER — GLIPIZIDE ER 2.5 MG PO TB24: 2.5000 mg | ORAL_TABLET | Freq: Every morning | ORAL | 1 refills | Status: DC

## 2020-05-31 MED ORDER — GLIPIZIDE ER 5 MG PO TB24: 5.0000 mg | ORAL_TABLET | Freq: Every morning | ORAL | 1 refills | Status: DC

## 2020-05-31 MED ORDER — SPIRONOLACTONE-HCTZ 25-25 MG PO TABS: 0.5000 | ORAL_TABLET | Freq: Every day | ORAL | 1 refills | Status: DC

## 2020-05-31 MED ORDER — PRAVASTATIN SODIUM 40 MG PO TABS: 40.0000 mg | ORAL_TABLET | Freq: Every day | ORAL | 1 refills | Status: DC

## 2020-06-06 ENCOUNTER — Other Ambulatory Visit: Payer: Self-pay | Admitting: Family Medicine

## 2020-06-06 DIAGNOSIS — I10 Essential (primary) hypertension: Secondary | ICD-10-CM

## 2020-06-08 ENCOUNTER — Encounter: Payer: Self-pay | Admitting: Family Medicine

## 2020-06-13 ENCOUNTER — Encounter: Payer: Self-pay | Admitting: Family Medicine

## 2020-07-11 ENCOUNTER — Encounter: Payer: Self-pay | Admitting: Family Medicine

## 2020-07-27 ENCOUNTER — Telehealth: Payer: Self-pay

## 2020-07-27 ENCOUNTER — Ambulatory Visit: Payer: Medicare Other

## 2020-07-27 NOTE — Telephone Encounter (Signed)
Outreach call to patient regarding canceled AWV for today. Pt states she sent MyChart message to cancel appt. Message seen in chart but wanted to verify with patient that AWV separate visit. Pt aware and declined future AWV's with [facility] due to not feeling like it was necessary. Pt states she will call back if interested.

## 2020-08-07 ENCOUNTER — Ambulatory Visit: Payer: Self-pay | Admitting: *Deleted

## 2020-08-07 NOTE — Telephone Encounter (Addendum)
Pt called in c/o a sharp pain in her right chest area and between her breasts and around into her back when she takes a deep breath.  She has a low grade fever 99 but her normal is 97.4. She had pleurisy in April and this feels exactly like she did then.   She does not want to end up in the hospital again.  She is requesting to be seen in the office instead of a video.  "Someone needs to listen to my lungs and Dr. Ancil Boozer is familiar with me".    "I don't have covid".    The covid questionnaire was completed and it indicated a virtual visit due to the cough and fever.  She was agreeable to having someone from the office call her back to see if they can get her in to see Dr. Ancil Boozer or any of the other providers she was fine with that too.  Due to the long ED wait times and exposure to covid she really prefers not to go to the ED.  I sent a high priority note to [facility] with her request I also called in to the office and spoke with Melissa.  There are no openings today with anyone.    The protocol is to go to the ED.    I called the pt back and let her know the office does not have any openings with anyone today.    She stated,  "I'm not going to the emergency room".   "No, No, No".   I asked if she would be willing to go to an urgent care.   At least they could do a chest x ray and listen to her lungs and get treatment started.   She was agreeable to this plan and is going to the [employer] in clinic.      Reason for Disposition . Taking a deep breath makes pain worse  Answer Assessment - Initial Assessment Questions 1. LOCATION: "Where does it hurt?"       She called in.  I had pluresy last April.  I'm having sharp pain in the right side of my chest and between my breasts and it hurts when I take a deep breath.   My temperature is 99 but my normal is 97.4 2. RADIATION: "Does the pain go anywhere else?" (e.g., into neck, jaw, arms, back)     Deep breath hurts and I  have a dry cough. 3. ONSET: "When did the chest pain begin?" (Minutes, hours or days)      Yesterday morning I woke up with it. 4. PATTERN "Does the pain come and go, or has it been constant since it started?"  "Does it get worse with exertion?"      With a deep breath 5. DURATION: "How long does it last" (e.g., seconds, minutes, hours)     *No Answer* 6. SEVERITY: "How bad is the pain?"  (e.g., Scale 1-10; mild, moderate, or severe)    - MILD (1-3): doesn't interfere with normal activities     - MODERATE (4-7): interferes with normal activities or awakens from sleep    - SEVERE (8-10): excruciating pain, unable to do any normal activities       Last time I took ibuprofen for the pleursy.   7. CARDIAC RISK FACTORS: "Do you have any history of heart problems or risk factors for heart disease?" (e.g., angina, prior heart attack; diabetes, high blood pressure, high cholesterol, smoker, or strong  family history of heart disease)     No 8. PULMONARY RISK FACTORS: "Do you have any history of lung disease?"  (e.g., blood clots in lung, asthma, emphysema, birth control pills)     This is just like I felt in April.  I was in the hospital. 9. CAUSE: "What do you think is causing the chest pain?"     Pleursy 10. OTHER SYMPTOMS: "Do you have any other symptoms?" (e.g., dizziness, nausea, vomiting, sweating, fever, difficulty breathing, cough)       No     11. PREGNANCY: "Is there any chance you are pregnant?" "When was your last menstrual period?"       N/A  Protocols used: CHEST PAIN-A-AH

## 2020-08-13 ENCOUNTER — Encounter: Payer: Self-pay | Admitting: Family Medicine

## 2020-08-14 ENCOUNTER — Other Ambulatory Visit: Payer: Self-pay

## 2020-08-14 DIAGNOSIS — E1169 Type 2 diabetes mellitus with other specified complication: Secondary | ICD-10-CM

## 2020-08-14 DIAGNOSIS — E669 Obesity, unspecified: Secondary | ICD-10-CM

## 2020-08-14 MED ORDER — METFORMIN HCL ER 750 MG PO TB24: 1500.0000 mg | ORAL_TABLET | Freq: Every day | ORAL | 1 refills | Status: DC

## 2020-09-04 ENCOUNTER — Other Ambulatory Visit: Payer: Self-pay | Admitting: Family Medicine

## 2020-09-04 DIAGNOSIS — I10 Essential (primary) hypertension: Secondary | ICD-10-CM

## 2020-09-09 ENCOUNTER — Other Ambulatory Visit: Payer: Self-pay | Admitting: Family Medicine

## 2020-09-09 DIAGNOSIS — K580 Irritable bowel syndrome with diarrhea: Secondary | ICD-10-CM

## 2020-09-09 NOTE — Telephone Encounter (Signed)
Requested Prescriptions  Pending Prescriptions Disp Refills  . dicyclomine (BENTYL) 20 MG tablet [Pharmacy Med Name: DICYCLOMINE HCL TABS 20MG] 360 tablet 3    Sig: TAKE 1 TABLET FOUR TIMES A DAY BEFORE MEALS AND AT BEDTIME     Gastroenterology:  Antispasmodic Agents Passed - 09/09/2020 11:57 AM      Passed - Last Heart Rate in normal range    Pulse Readings from Last 1 Encounters:  05/31/20 88         Passed - Valid encounter within last 12 months    Recent Outpatient Visits          3 months ago Diabetes mellitus type 2 in obese [facility])   [facility] [place], Drue Stager, MD   7 months ago Diabetes mellitus type 2 in obese [facility])   [facility] Steele Sizer, MD   9 months ago Sore throat   [facility] [place], Drue Stager, MD   11 months ago Dyslipidemia associated with type 2 diabetes mellitus [facility])   [facility] Steele Sizer, MD   1 year ago Obstructive apnea   [facility] Steele Sizer, MD      Future Appointments            In 3 weeks Steele Sizer, MD [facility], [facility]

## 2020-09-19 ENCOUNTER — Encounter: Payer: Self-pay | Admitting: Family Medicine

## 2020-09-26 ENCOUNTER — Telehealth: Payer: Self-pay

## 2020-09-26 NOTE — Telephone Encounter (Signed)
Copied from [place] [id number]. Topic: General - Other >> Sep 25, 2020  6:33 PM Loma Boston wrote: Reason for CRM: pt has called is out of town, [place]. We have moved her FU to May as she will not be returning for a week and she wanted to have 2 wks out before coming into office due to concerns of covid in [place]. She also wants to relay that She had bloodwork with Dr Dossie Der at [facility] and that it turned out well, everything seems good. No CB needed

## 2020-10-02 ENCOUNTER — Ambulatory Visit: Payer: Medicare Other | Admitting: Family Medicine

## 2020-10-23 NOTE — Progress Notes (Signed)
Name: Mary Cantrell   MRN: [id number]    DOB: 1945/07/03   Date:10/24/2020       Progress Note  Subjective  Chief Complaint  Follow Up  HPI  DMII: last A1C was 7.1% it went to 7.8%,7.5 % and down to 6.9 % today   She denies polyphagia, polydipsia or polyuria, she is compliant with medication. Taking Metformin 3 tablets every morning, but rx says two daily, also glipizide 7.5 mg- she did not bring her bottles with her. A1C is at goal, advised her to confirm how she is taking it before I send a new refill   . She has associated obesity, dyslipidemia, chronic kidney disease, urine micro was normal 02/02/2020, she states she has been eating healthy  OSA: on CPAP : wears it at 99 % of the time - goes to bed wearing it but sometimes removes during the night,  she denies day time somnolence or headaches.   Hyperlipidemia: reviewed labs done by Endo 04/19/2019 showed LDL 87, HDL 60.4. She was on Pravastatin , LDL goal is below 70. We tried to adjust dose but she could not tolerate the higher dose, we switched to Crestor but unable to tolerate it, she is back on Pravastatin 40 mg and is compliant, LDL close to goal at 77,she does not want to change medications at this time, we will recheck it next visit   Mild Intermittent Asthma: under the care of Dr. Raul Del , she is on Symbicort and albuterol prn, she has stable SOB with activity and likely multifactorial. She denies cough or wheezing   Hypertension with CKI stage III: she is on ARB and Aldactazide half pill daily. No chest pain or palpitation, denies dizziness BP is at goal   Psoriasis: on her elbows, uses topical medication prn, doing well at this time  PMR: still under the care of Rheumatologist, Dr. Dossie Der  She also has OA also psoriatic arthritis and is on methotrexate , she states unable to go down on the dose because pain increases, she also takes prednisone 7.5 mg daily to control symptoms  She states only problem right hip, but had injection  of steroid recently and it helped with pain   Tremors  she is under the care of Dr. Melrose Nakayama, she has tremors, balanced problems, sensory ataxia  and is taking gabapentin, tried going up on the dose to 300 mg TID but it caused severe fatigue, she is down to twice daily. Tremors improved initially but since went down on the dose it is back to how it was, but still taking medication twice daily because it helps with low back pain    Patient Active Problem List   Diagnosis Date Noted  . Right leg pain 04/24/2020  . Neuropathy 02/27/2020  . 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 Tremor 02/27/2020  . Chronic kidney disease, stage III (moderate) ([place]) 12/09/2018  . Dyslipidemia associated with type 2 diabetes mellitus ([place]) 12/09/2018  . Obesity (BMI 30.0-34.9) 07/17/2018  . Hypertension associated with stage 2 chronic kidney disease due to type 2 diabetes mellitus ([place]) 03/15/2018  . Psoriatic arthritis ([place]) 12/04/2017  . Stenosis of left carotid artery 12/16/2016  . Polymyalgia rheumatica ([place]) 04/13/2015  . Benign hypertension with chronic kidney disease, stage III ([place]) 12/21/2014  . Back pain, chronic 12/21/2014  . Narrowing of intervertebral disc space 12/21/2014  . Diabetic gastroparesis ([place]) 12/21/2014  . Amaurosis fugax of left eye 12/21/2014  . H/O: hypothyroidism 12/21/2014  . Hyperlipidemia 12/21/2014  . IBS (  irritable bowel syndrome) 12/21/2014  . Disorder of macula of retina 12/21/2014  . Asthma, moderate 12/21/2014  . NASH (nonalcoholic steatohepatitis) 12/21/2014  . NS (nuclear sclerosis) 12/21/2014  . Osteoarthrosis 12/21/2014  . Psoriasis 12/21/2014  . Knee torn cartilage 12/21/2014  . Closed dislocation of jaw 12/21/2014  . Diabetes mellitus type 2 in obese ([place]) 12/21/2014  . Obstructive apnea 01/03/2014  . History of shoulder surgery 04/23/2011    Past Surgical History:  Procedure Laterality Date  . ABDOMINAL HYSTERECTOMY    . APPENDECTOMY    . ARTERY BIOPSY N/A  02/23/2015   Procedure: BIOPSY TEMPORAL ARTERY;  Surgeon: Algernon Huxley, MD;  Location: [facility] ORS;  Service: Vascular;  Laterality: N/A;  . BREAST BIOPSY Left    1990's  . EXCISION / CURETTAGE BONE CYST FINGER     Left Index Finger  . PILONIDAL CYST EXCISION    . SHOULDER ARTHROSCOPY Right [id number]   Dr. Mauri Pole  . STOMACH SURGERY  1941   3 arteries leaking in stomach after appendectomy  . TONSILLECTOMY      Family History  Problem Relation Age of Onset  . Rheum arthritis Father   . Diabetes Father   . Pneumonia Father   . Pneumonia Mother   . Diabetes Sister   . Osteoarthritis Sister   . Breast cancer Neg Hx     Social History   Tobacco Use  . Smoking status: Never Smoker  . Smokeless tobacco: Never Used  Substance Use Topics  . Alcohol use: No    Alcohol/week: 0.0 standard drinks     Current Outpatient Medications:  .  albuterol (VENTOLIN HFA) 108 (90 Base) MCG/ACT inhaler, Inhale 2 puffs into the lungs every 4 (four) hours as needed for wheezing (takes every am and then as needed.). , Disp: , Rfl:  .  aspirin 81 MG tablet, Take 81 mg by mouth daily. , Disp: , Rfl:  .  Calcium Carbonate-Vit D-Min ([employer] CALCIUM 600 + D/MINERALS) 600-400 MG-UNIT TABS, Take 1 tablet by mouth daily., Disp: , Rfl: 1 .  cholecalciferol (VITAMIN D3) 25 MCG (1000 UNIT) tablet, Take 1,000 Units by mouth daily., Disp: , Rfl:  .  Cinnamon 500 MG capsule, Take 1,000 mg by mouth daily. , Disp: , Rfl:  .  cyanocobalamin 100 MCG tablet, Take 100 mcg by mouth 2 (two) times a week. , Disp: , Rfl:  .  Cysteamine Bitartrate (PROCYSBI) 300 MG PACK, Use 1 each 3 (three) times daily Use as instructed., Disp: , Rfl:  .  dicyclomine (BENTYL) 20 MG tablet, TAKE 1 TABLET FOUR TIMES A DAY BEFORE MEALS AND AT BEDTIME, Disp: 360 tablet, Rfl: 3 .  fluticasone (FLONASE) 50 MCG/ACT nasal spray, USE 2 SPRAYS IN EACH NOSTRIL AT BEDTIME, Disp: 48 g, Rfl: 3 .  folic acid (FOLVITE) 1 MG tablet, Take 1 mg by mouth daily. , Disp:  , Rfl:  .  gabapentin (NEURONTIN) 300 MG capsule, Take 1 capsule by mouth 2 (two) times daily., Disp: , Rfl:  .  glipiZIDE (GLUCOTROL XL) 2.5 MG 24 hr tablet, Take 1 tablet (2.5 mg total) by mouth every morning., Disp: 90 tablet, Rfl: 1 .  glipiZIDE (GLUCOTROL XL) 5 MG 24 hr tablet, Take 1 tablet (5 mg total) by mouth every morning., Disp: 90 tablet, Rfl: 1 .  ipratropium (ATROVENT) 0.03 % nasal spray, Place 2 sprays into both nostrils every 12 (twelve) hours., Disp: 90 mL, Rfl: 1 .  metFORMIN (GLUCOPHAGE XR) 750 MG 24 hr  tablet, Take 2 tablets (1,500 mg total) by mouth daily with breakfast., Disp: 180 tablet, Rfl: 1 .  methotrexate (RHEUMATREX) 2.5 MG tablet, Take 20 mg by mouth once a week. Caution:Chemotherapy. Protect from light., Disp: , Rfl:  .  montelukast (SINGULAIR) 10 MG tablet, Take 10 mg by mouth every evening., Disp: , Rfl:  .  Multiple Vitamins-Minerals (MULTIVITAMIN ADULTS PO), Take 1 tablet by mouth daily. , Disp: , Rfl:  .  naproxen (NAPROSYN) 500 MG tablet, Take 500 mg by mouth 2 (two) times daily as needed., Disp: , Rfl:  .  olmesartan (BENICAR) 20 MG tablet, TAKE 1 TABLET DAILY, Disp: 90 tablet, Rfl: 0 .  ONE TOUCH ULTRA TEST test strip, TEST 2 (TWO) TIMES DAILY. DX: E11.69, E11.43. *INS.ONLY PAYS FOR ONCE DAILY TESTING*, Disp: 100 each, Rfl: 2 .  pravastatin (PRAVACHOL) 40 MG tablet, Take 1 tablet (40 mg total) by mouth daily. Cannot tolerate 80 mg, Disp: 90 tablet, Rfl: 1 .  predniSONE (DELTASONE) 2.5 MG tablet, Take 2.5 mg by mouth daily., Disp: , Rfl:  .  predniSONE (DELTASONE) 5 MG tablet, Take 5 mg by mouth daily., Disp: , Rfl:  .  Propylene Glycol (SYSTANE BALANCE OP), Apply to eye. Only as needed, Disp: , Rfl:  .  spironolactone-hydrochlorothiazide (ALDACTAZIDE) 25-25 MG tablet, Take 0.5 tablets by mouth daily., Disp: 45 tablet, Rfl: 1 .  vitamin C (ASCORBIC ACID) 500 MG tablet, Take 1 tablet by mouth at bedtime. , Disp: , Rfl:  .  budesonide-formoterol (SYMBICORT)  160-4.5 MCG/ACT inhaler, Inhale 2 puffs into the lungs every morning., Disp: , Rfl:  .  calcipotriene-betamethasone (TACLONEX) ointment, Apply topically daily. (Patient not taking: Reported on 10/24/2020), Disp: , Rfl:   Allergies  Allergen Reactions  . Doxycycline Other (See Comments)    Weakness, flu like symptoms  . Codeine     Other reaction(s): Other (See Comments) Dizzy  . Lantus  [Insulin Glargine]     made skin hot  . Penicillin G     Other reaction(s): Unknown  . Rosuvastatin     Myalgia   . Sitagliptin   . Sulfa Antibiotics     Other reaction(s): Other (See Comments) Red burning skin  . Wound Dressing Adhesive Rash    Surgical tape adhesive    I personally reviewed active problem list, medication list, allergies, family history, social history, health maintenance with the patient/caregiver today.   ROS  Constitutional: Negative for fever or weight change.  Respiratory: Negative for cough , positive for shortness of breath - stable.   Cardiovascular: Negative for chest pain or palpitations.  Gastrointestinal: Negative for abdominal pain, no bowel changes.  Musculoskeletal: Negative for gait problem or joint swelling.  Skin: Negative for rash.  Neurological: Negative for dizziness or headache.  No other specific complaints in a complete review of systems (except as listed in HPI above).  Objective  Vitals:   10/24/20 1448  BP: 132/76  Pulse: 89  Resp: 16  Temp: 98.7 F (37.1 C)  TempSrc: Oral  SpO2: 96%  Weight: 185 lb (83.9 kg)  Height: 5' 6"  (1.676 m)    Body mass index is 29.86 kg/m.  Physical Exam  Constitutional: Patient appears well-developed and well-nourished. Overweight.  No distress.  HEENT: head atraumatic, normocephalic, pupils equal and reactive to light,  neck supple Cardiovascular: Normal rate, regular rhythm and normal heart sounds.  No murmur heard. No BLE edema. Pulmonary/Chest: Effort normal and breath sounds normal. No  respiratory distress. Abdominal: Soft.  There is no tenderness. Psychiatric: Patient has a normal mood and affect. behavior is normal. Judgment and thought content normal.  Recent Results (from the past 2160 hour(s))  POCT HgB A1C     Status: Abnormal   Collection Time: 10/24/20  2:50 PM  Result Value Ref Range   Hemoglobin A1C 6.9 (A) 4.0 - 5.6 %   HbA1c POC (<> result, manual entry)     HbA1c, POC (prediabetic range)     HbA1c, POC (controlled diabetic range)       PHQ2/9: Depression screen [facility] 2/9 10/24/2020 05/31/2020 01/28/2020 12/08/2019 10/11/2019  Decreased Interest 0 0 0 0 0  Down, Depressed, Hopeless 0 0 0 0 0  PHQ - 2 Score 0 0 0 0 0  Altered sleeping - - 0 0 0  Tired, decreased energy - - 0 1 0  Change in appetite - - 0 0 0  Feeling bad or failure about yourself  - - 0 0 0  Trouble concentrating - - 0 0 0  Moving slowly or fidgety/restless - - 0 0 0  Suicidal thoughts - - 0 0 0  PHQ-9 Score - - 0 1 0  Difficult doing work/chores - - - Not difficult at all -  Some recent data might be hidden    phq 9 is negative   Fall Risk: Fall Risk  10/24/2020 05/31/2020 01/28/2020 12/08/2019 10/11/2019  Falls in the past year? 0 0 0 0 0  Comment - - - - -  Number falls in past yr: 0 0 0 - 0  Injury with Fall? 0 0 0 - 0  Comment - - - - -  Risk for fall due to : - - - - -  Follow up - - - - -     Functional Status Survey: Is the patient deaf or have difficulty hearing?: Yes Does the patient have difficulty seeing, even when wearing glasses/contacts?: Yes Does the patient have difficulty concentrating, remembering, or making decisions?: No Does the patient have difficulty walking or climbing stairs?: Yes Does the patient have difficulty dressing or bathing?: Yes Does the patient have difficulty doing errands alone such as visiting a doctor's office or shopping?: No    Assessment & Plan  1. Diabetes mellitus type 2 in obese (HCC)  - POCT HgB A1C  2. Dyslipidemia associated  with type 2 diabetes mellitus ([place])   3. Obstructive apnea   4. Psoriatic arthritis ([place])  Stable, sees Rheumatologist   5. Benign hypertension with chronic kidney disease, stage III ([place])   6. Dyslipidemia  On low dose pravastatin   7. Hypertension, benign  At goal   8. Tremors of nervous system  Taking gabapentin   9. Stage 3a chronic kidney disease ([place])   10. Claudication ([place])  Stable, due to joint problems not vascular and stable.   11. DDD (degenerative disc disease), lumbar  Stable on medication

## 2020-10-24 ENCOUNTER — Ambulatory Visit (INDEPENDENT_AMBULATORY_CARE_PROVIDER_SITE_OTHER): Payer: Medicare Other | Admitting: Family Medicine

## 2020-10-24 ENCOUNTER — Encounter: Payer: Self-pay | Admitting: Family Medicine

## 2020-10-24 ENCOUNTER — Other Ambulatory Visit: Payer: Self-pay

## 2020-10-24 VITALS — BP 132/76 | HR 89 | Temp 98.7°F | Resp 16 | Ht 66.0 in | Wt 185.0 lb

## 2020-10-24 DIAGNOSIS — E1169 Type 2 diabetes mellitus with other specified complication: Secondary | ICD-10-CM | POA: Diagnosis not present

## 2020-10-24 DIAGNOSIS — I10 Essential (primary) hypertension: Secondary | ICD-10-CM

## 2020-10-24 DIAGNOSIS — I129 Hypertensive chronic kidney disease with stage 1 through stage 4 chronic kidney disease, or unspecified chronic kidney disease: Secondary | ICD-10-CM

## 2020-10-24 DIAGNOSIS — G4733 Obstructive sleep apnea (adult) (pediatric): Secondary | ICD-10-CM | POA: Diagnosis not present

## 2020-10-24 DIAGNOSIS — I739 Peripheral vascular disease, unspecified: Secondary | ICD-10-CM

## 2020-10-24 DIAGNOSIS — E785 Hyperlipidemia, unspecified: Secondary | ICD-10-CM

## 2020-10-24 DIAGNOSIS — R251 Tremor, unspecified: Secondary | ICD-10-CM

## 2020-10-24 DIAGNOSIS — E669 Obesity, unspecified: Secondary | ICD-10-CM | POA: Diagnosis not present

## 2020-10-24 DIAGNOSIS — N1831 Chronic kidney disease, stage 3a: Secondary | ICD-10-CM

## 2020-10-24 DIAGNOSIS — M51369 Other intervertebral disc degeneration, lumbar region without mention of lumbar back pain or lower extremity pain: Secondary | ICD-10-CM | POA: Insufficient documentation

## 2020-10-24 DIAGNOSIS — N183 Chronic kidney disease, stage 3 unspecified: Secondary | ICD-10-CM

## 2020-10-24 DIAGNOSIS — L4050 Arthropathic psoriasis, unspecified: Secondary | ICD-10-CM | POA: Diagnosis not present

## 2020-10-24 DIAGNOSIS — M5136 Other intervertebral disc degeneration, lumbar region: Secondary | ICD-10-CM | POA: Insufficient documentation

## 2020-10-24 LAB — POCT GLYCOSYLATED HEMOGLOBIN (HGB A1C): Hemoglobin A1C: 6.9 % — AB (ref 4.0–5.6)

## 2020-11-06 ENCOUNTER — Other Ambulatory Visit: Payer: Self-pay | Admitting: Family Medicine

## 2020-11-06 DIAGNOSIS — E1169 Type 2 diabetes mellitus with other specified complication: Secondary | ICD-10-CM

## 2020-11-06 DIAGNOSIS — E785 Hyperlipidemia, unspecified: Secondary | ICD-10-CM

## 2020-11-23 ENCOUNTER — Other Ambulatory Visit: Payer: Self-pay | Admitting: Family Medicine

## 2020-11-23 DIAGNOSIS — E1169 Type 2 diabetes mellitus with other specified complication: Secondary | ICD-10-CM

## 2020-11-23 DIAGNOSIS — E669 Obesity, unspecified: Secondary | ICD-10-CM

## 2020-11-23 NOTE — Telephone Encounter (Signed)
Requested Prescriptions  Pending Prescriptions Disp Refills  . glipiZIDE (GLUCOTROL XL) 5 MG 24 hr tablet [Pharmacy Med Name: GLIPIZIDE ER TABS 5MG] 90 tablet 1    Sig: TAKE 1 TABLET EVERY MORNING     Endocrinology:  Diabetes - Sulfonylureas Passed - 11/23/2020 12:39 AM      Passed - HBA1C is between 0 and 7.9 and within 180 days    Hemoglobin A1C  Date Value Ref Range Status  10/24/2020 6.9 (A) 4.0 - 5.6 % Final  04/19/2019 7.3  Final         Passed - Valid encounter within last 6 months    Recent Outpatient Visits          1 month ago Diabetes mellitus type 2 in obese [facility])   [facility] [place], Drue Stager, MD   5 months ago Diabetes mellitus type 2 in obese [facility])   [facility] [place], Drue Stager, MD   10 months ago Diabetes mellitus type 2 in obese [facility])   [facility] Steele Sizer, MD   11 months ago Sore throat   [facility] [place], Drue Stager, MD   1 year ago Dyslipidemia associated with type 2 diabetes mellitus [facility])   [facility] Steele Sizer, MD      Future Appointments            In 3 months Ancil Boozer, Drue Stager, MD [facility], [facility]

## 2020-11-27 ENCOUNTER — Other Ambulatory Visit: Payer: Self-pay | Admitting: Family Medicine

## 2020-11-27 DIAGNOSIS — I10 Essential (primary) hypertension: Secondary | ICD-10-CM

## 2020-12-04 ENCOUNTER — Other Ambulatory Visit: Payer: Self-pay | Admitting: Family Medicine

## 2020-12-04 DIAGNOSIS — I10 Essential (primary) hypertension: Secondary | ICD-10-CM

## 2020-12-18 ENCOUNTER — Ambulatory Visit: Payer: Self-pay | Admitting: *Deleted

## 2020-12-18 ENCOUNTER — Other Ambulatory Visit
Admission: RE | Admit: 2020-12-18 | Discharge: 2020-12-18 | Disposition: A | Discharge status: Home / Self Care | Payer: Medicare Other | Source: Ambulatory Visit | Attending: Specialist | Admitting: Specialist

## 2020-12-18 DIAGNOSIS — J209 Acute bronchitis, unspecified: Secondary | ICD-10-CM | POA: Diagnosis present

## 2020-12-18 DIAGNOSIS — R0602 Shortness of breath: Secondary | ICD-10-CM | POA: Insufficient documentation

## 2020-12-18 LAB — D-DIMER, QUANTITATIVE: D-Dimer, Quant: 0.57 ug/mL-FEU — ABNORMAL HIGH (ref 0.00–0.50)

## 2020-12-18 NOTE — Telephone Encounter (Signed)
Reason for Disposition . Sounds like a life-threatening emergency to the triager    Chest pain, very short of breath, severe fatigue.  All of this going on for several weeks until it has built up to "where I can't do anything anymore".     Pt refusing to go to the ED.  Answer Assessment - Initial Assessment Questions 1. LOCATION: "Where does it hurt?"       Husband calling in.   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  COPD and diabetes.   She is having chest pain.   Her breathing is labored and walking across the room leaves her out of breath.   Glucose is all over the place.   She takes pills.    124-299 on the glucose meter through the day. She is using CPAP machine all weekend all the time.  It brings the O2 levels up.   88-94% on the pulse oximeter with the CPAP on.    BP is lower than normal.    It started some weeks ago.   It's debilitating.    Husband put wife on the phone.    My O2 level is 94-97 normally without CPAP.     I'm having trouble breathing and I'm having pain in my chest.   My rib cage hurts when I take a deep breath, try to get up or move around.   The costochondritis does that.   I went to the ED in October 01, 2020.   They told me it was costochondritis.    I have complete exhausting.   I can't hardly get a shower.    2. RADIATION: "Does the pain go anywhere else?" (e.g., into neck, jaw, arms, back)     She is seeing rheumatologist and a lung dr.   Dr. Raul Del.    Nothing seems to be helping her. I really don't want to take her to the ED.   Can I bring in my wife into the office.    The chest pain is not from a heart attack.    They determined the sac around her heart was inflamed.   3. ONSET: "When did the chest pain begin?" (Minutes, hours or days)      In the ED they gave ibuprofen prescription.    4. PATTERN "Does the pain come and go, or has it been constant since it started?"  "Does it get worse with exertion?"      Constant 5. DURATION: "How long does it last"  (e.g., seconds, minutes, hours)     4 weeks ago all this started.   It's just become so bad now. 6. SEVERITY: "How bad is the pain?"  (e.g., Scale 1-10; mild, moderate, or severe)    - MILD (1-3): doesn't interfere with normal activities     - MODERATE (4-7): interferes with normal activities or awakens from sleep    - SEVERE (8-10): excruciating pain, unable to do any normal activities       Severe 7. CARDIAC RISK FACTORS: "Do you have any history of heart problems or risk factors for heart disease?" (e.g., angina, prior heart attack; diabetes, high blood pressure, high cholesterol, smoker, or strong family history of heart disease)     Heart is fine 8. PULMONARY RISK FACTORS: "Do you have any history of lung disease?"  (e.g., blood clots in lung, asthma, emphysema, birth control pills)     COPD 9. CAUSE: "What do you think is causing the chest pain?"  The costochondritis         I refuse to go to the ED or the urgent care! 10. OTHER SYMPTOMS: "Do you have any other symptoms?" (e.g., dizziness, nausea, vomiting, sweating, fever, difficulty breathing, cough)       Extreme fatigue, chest pain, very short of breath-staying in her CPAP machine to keep her O2 levels between 88-94%.   They normally without using the CPAP machine are 94-97% via pulse ox.     She is talking in short sentences due to the shortness of breath.  Per husband she is staying in bed most of the time because she is too exhausted to do anything.   He had to get her up to answer my triage questions. 11. PREGNANCY: "Is there any chance you are pregnant?" "When was your last menstrual period?"       N/A  Protocols used: Chest Pain-A-AH

## 2020-12-18 NOTE — Telephone Encounter (Addendum)
Pt's husband called in at first.   We got disconnected (we have bad wi-fi, per pt).    Not use the [phone] number.   Please use [phone] to return their call.  I called them back and spoke with husband Hinda Glatter.   He tried to answer as many of the questions as he could.   She is still in bed.   "I don't want to wake her if possible".    I requested he put her on the phone after he could no longer answer my triage questions.  She is c/o severe chest pain and severe shortness of breath (talking in short sentences) and extreme fatigue.   "I can't even hardly take a shower I'm so exhausted".    She was explaining she has a condition called costochondritis and COPD.  "Over the last several weeks the pain has become so severe I can't hardly do anything".   She is using her CPAP machine all the time to keep her O2 levels in upper 80s to low 90s via pulse ox.   She normally runs in upper 90s.     Not on O2.    She had this same thing in April 10 and went to the ED.   She is refusing to go to the ED or an urgent care.    "I can't go sit there for hours".    "It's the costochondritis".    "They did all the heart tests and my heart is fine".    "They gave me a prescription for ibuprofen".    "I take that".      She is refusing to go to the ED.    I let her know I would send an urgent message and contact Dr. Ancil Boozer to see what she wants the pt to do.   Pt was agreeable to this but told me,  "I am NOT going to the ED".    "You can tell Dr. Ancil Boozer that".   Please call me at [phone] I can hear better on that line.     See triage notes.  I sent my notes high priority to [facility] for Dr. Ancil Boozer.    I called into the office and made them aware of the situation.   They will call pt back but agree she needs to go to the ED.    Maybe can do a virtual to touch base with her.      Note sent to office

## 2020-12-18 NOTE — Telephone Encounter (Signed)
Pt refuses to go and hung up on me.

## 2020-12-19 ENCOUNTER — Other Ambulatory Visit: Payer: Self-pay | Admitting: Specialist

## 2020-12-19 ENCOUNTER — Other Ambulatory Visit: Payer: Self-pay

## 2020-12-19 ENCOUNTER — Ambulatory Visit
Admission: RE | Admit: 2020-12-19 | Discharge: 2020-12-19 | Disposition: A | Discharge status: Home / Self Care | Payer: Medicare Other | Source: Ambulatory Visit | Attending: Specialist | Admitting: Specialist

## 2020-12-19 DIAGNOSIS — R7989 Other specified abnormal findings of blood chemistry: Secondary | ICD-10-CM | POA: Insufficient documentation

## 2020-12-19 DIAGNOSIS — R0602 Shortness of breath: Secondary | ICD-10-CM

## 2020-12-19 HISTORY — DX: Type 2 diabetes mellitus without complications: E11.9

## 2020-12-19 LAB — POCT I-STAT CREATININE: Creatinine, Ser: 0.9 mg/dL (ref 0.44–1.00)

## 2020-12-19 MED ORDER — IOHEXOL 350 MG/ML SOLN
75.0000 mL | Freq: Once | INTRAVENOUS | Status: AC | PRN
  Administered 2020-12-19: 75 mL via INTRAVENOUS

## 2021-01-29 ENCOUNTER — Other Ambulatory Visit: Payer: Self-pay | Admitting: Family Medicine

## 2021-01-29 DIAGNOSIS — E1169 Type 2 diabetes mellitus with other specified complication: Secondary | ICD-10-CM

## 2021-01-29 NOTE — Telephone Encounter (Signed)
  Notes to clinic:  Review for refill Last filled was  8m strength and this is for 2.566m   Requested Prescriptions  Pending Prescriptions Disp Refills   glipiZIDE (GLUCOTROL XL) 2.5 MG 24 hr tablet [Pharmacy Med Name: GLIPIZIDE ER TABS 2.5MG] 90 tablet 3    Sig: TAKE 1 TABLET EVERY MORNING      Endocrinology:  Diabetes - Sulfonylureas Passed - 01/29/2021  1:12 AM      Passed - HBA1C is between 0 and 7.9 and within 180 days    Hemoglobin A1C  Date Value Ref Range Status  10/24/2020 6.9 (A) 4.0 - 5.6 % Final  04/19/2019 7.3  Final          Passed - Valid encounter within last 6 months    Recent Outpatient Visits           3 months ago [facility]   8 months [facility]   1 year [facility]   1 year ago Sore throat   [facility]   1 year ago [facility]ys[facility] with type 2 diabetes [facility] ([facility]       Future Appointments             In 1 month SoAncil BoozerK[facility]Drue St[facility]MD [facility]

## 2021-01-29 NOTE — Telephone Encounter (Signed)
Pts next appt is 02/28/21

## 2021-02-02 ENCOUNTER — Other Ambulatory Visit: Payer: Self-pay | Admitting: Family Medicine

## 2021-02-02 ENCOUNTER — Encounter: Payer: Self-pay | Admitting: Family Medicine

## 2021-02-02 DIAGNOSIS — E1169 Type 2 diabetes mellitus with other specified complication: Secondary | ICD-10-CM

## 2021-02-28 ENCOUNTER — Other Ambulatory Visit: Payer: Self-pay

## 2021-02-28 ENCOUNTER — Telehealth: Payer: Self-pay

## 2021-02-28 ENCOUNTER — Encounter: Payer: Self-pay | Admitting: Family Medicine

## 2021-02-28 ENCOUNTER — Ambulatory Visit (INDEPENDENT_AMBULATORY_CARE_PROVIDER_SITE_OTHER): Payer: Medicare Other | Admitting: Family Medicine

## 2021-02-28 VITALS — BP 134/70 | HR 86 | Temp 98.3°F | Resp 16 | Ht 66.0 in | Wt 185.0 lb

## 2021-02-28 DIAGNOSIS — Z23 Encounter for immunization: Secondary | ICD-10-CM

## 2021-02-28 DIAGNOSIS — N1831 Chronic kidney disease, stage 3a: Secondary | ICD-10-CM

## 2021-02-28 DIAGNOSIS — D72829 Elevated white blood cell count, unspecified: Secondary | ICD-10-CM

## 2021-02-28 DIAGNOSIS — G4733 Obstructive sleep apnea (adult) (pediatric): Secondary | ICD-10-CM

## 2021-02-28 DIAGNOSIS — L4050 Arthropathic psoriasis, unspecified: Secondary | ICD-10-CM

## 2021-02-28 DIAGNOSIS — M5136 Other intervertebral disc degeneration, lumbar region: Secondary | ICD-10-CM

## 2021-02-28 DIAGNOSIS — E1169 Type 2 diabetes mellitus with other specified complication: Secondary | ICD-10-CM

## 2021-02-28 DIAGNOSIS — E785 Hyperlipidemia, unspecified: Secondary | ICD-10-CM

## 2021-02-28 DIAGNOSIS — R269 Unspecified abnormalities of gait and mobility: Secondary | ICD-10-CM

## 2021-02-28 DIAGNOSIS — I129 Hypertensive chronic kidney disease with stage 1 through stage 4 chronic kidney disease, or unspecified chronic kidney disease: Secondary | ICD-10-CM

## 2021-02-28 DIAGNOSIS — F341 Dysthymic disorder: Secondary | ICD-10-CM

## 2021-02-28 DIAGNOSIS — I739 Peripheral vascular disease, unspecified: Secondary | ICD-10-CM | POA: Diagnosis not present

## 2021-02-28 DIAGNOSIS — E669 Obesity, unspecified: Secondary | ICD-10-CM | POA: Diagnosis not present

## 2021-02-28 DIAGNOSIS — N183 Chronic kidney disease, stage 3 unspecified: Secondary | ICD-10-CM

## 2021-02-28 LAB — POCT GLYCOSYLATED HEMOGLOBIN (HGB A1C): Hemoglobin A1C: 7.4 % — AB (ref 4.0–5.6)

## 2021-02-28 MED ORDER — GLIPIZIDE ER 5 MG PO TB24: 5.0000 mg | ORAL_TABLET | Freq: Every morning | ORAL | 1 refills | Status: DC

## 2021-02-28 MED ORDER — METFORMIN HCL ER 750 MG PO TB24: 1500.0000 mg | ORAL_TABLET | Freq: Every day | ORAL | 1 refills | Status: DC

## 2021-02-28 MED ORDER — GLIPIZIDE ER 2.5 MG PO TB24: 2.5000 mg | ORAL_TABLET | Freq: Every morning | ORAL | 1 refills | Status: DC

## 2021-02-28 NOTE — Telephone Encounter (Signed)
Awaiting return call from patient. [facility] needed and also we have a recommendation for an eye care center that may be able to get her in to be seen for her cataracts.

## 2021-02-28 NOTE — Progress Notes (Signed)
Name: Mary Cantrell   MRN: [id number]    DOB: 12/01/1945   Date:02/28/2021       Progress Note  Subjective  Chief Complaint  Follow Up  HPI  DMII: last A1C was 7.1% it went to 7.8%,7.5 % it was down to 6.9 % but today it was up to 7.6 %  She denies polyphagia, polydipsia or polyuria, she is compliant with medication. Taking Metformin 3 tablets every morning, but rx says two daily, also glipizide 7.5 mg- she did not bring her bottles with her. A1C is at goal since she is over 70, she denies hypoglycemic episodes. She has associated obesity, dyslipidemia, chronic kidney disease, urine micro was normal 02/02/2020 we will recheck it today    OSA: on CPAP : wears it at 99 % of the time - goes to bed wearing it but sometimes removes during the night,  she denies day time somnolence or headaches. Doing well   Hyperlipidemia: reviewed labs done by Endo 04/19/2019 showed LDL 87, HDL 60.4. She was on Pravastatin , LDL goal is below 70. We tried to adjust dose but she could not tolerate the higher dose, we switched to Crestor but unable to tolerate it, she is back on Pravastatin 40 mg and is compliant, LDL close to goal at 77, we will recheck labs today    Mild Intermittent Asthma: under the care of Dr. Raul Del , she is on Symbicort and albuterol prn, she has stable SOB with activity and likely multifactorial. Unchanged   Hypertension with CKI stage III: she is on ARB and Aldactazide half pill daily. No chest pain or palpitation. Continue current dose. WE will check urine micro    Psoriasis: on her elbows, uses topical medication prn, no lesions at this time.   PMR: still under the care of Rheumatologist, Dr. Dossie Der  She also has OA also psoriatic arthritis and is on methotrexate , she states unable to go down on the dose because pain increases, she also takes prednisone 7.5 mg daily to control symptoms  She states only right hip improved after steroid injection but does not want to get another one because it  caused glucose to spike    Tremors  she is under the care of Dr. Melrose Nakayama, she has tremors, balanced problems, sensory ataxia  she stopped taking Gabapentin because it caused fatigue. She states tremors are stable, uses a cane to help with gait.   Patient Active Problem List   Diagnosis Date Noted   DDD (degenerative disc disease), lumbar 10/24/2020   Right leg pain 04/24/2020   Neuropathy 02/27/2020   Sensory ataxia 02/27/2020   Tremor 02/27/2020   Chronic kidney disease, stage III (moderate) ([place]) 12/09/2018   Dyslipidemia associated with type 2 diabetes mellitus ([place]) 12/09/2018   Obesity (BMI 30.0-34.9) 07/17/2018   Hypertension associated with stage 2 chronic kidney disease due to type 2 diabetes mellitus ([place]) 03/15/2018   Psoriatic arthritis ([place]) 12/04/2017   Stenosis of left carotid artery 12/16/2016   Polymyalgia rheumatica ([place]) 04/13/2015   Benign hypertension with chronic kidney disease, stage III ([place]) 12/21/2014   Back pain, chronic 12/21/2014   Narrowing of intervertebral disc space 12/21/2014   Diabetic gastroparesis ([place]) 12/21/2014   Amaurosis fugax of left eye 12/21/2014   H/O: hypothyroidism 12/21/2014   Hyperlipidemia 12/21/2014   IBS (irritable bowel syndrome) 12/21/2014   Disorder of macula of retina 12/21/2014   Asthma, moderate 12/21/2014   NASH (nonalcoholic steatohepatitis) 12/21/2014   NS (nuclear sclerosis) 12/21/2014  Osteoarthrosis 12/21/2014   Psoriasis 12/21/2014   Knee torn cartilage 12/21/2014   Closed dislocation of jaw 12/21/2014   Diabetes mellitus type 2 in obese ([place]) 12/21/2014   Obstructive apnea 01/03/2014   History of shoulder surgery 04/23/2011    Past Surgical History:  Procedure Laterality Date   ABDOMINAL HYSTERECTOMY     APPENDECTOMY     ARTERY BIOPSY N/A 02/23/2015   Procedure: BIOPSY TEMPORAL ARTERY;  Surgeon: Algernon Huxley, MD;  Location: [facility] ORS;  Service: Vascular;  Laterality: N/A;   BREAST BIOPSY Left    1990's    EXCISION / CURETTAGE BONE CYST FINGER     Left Index Finger   PILONIDAL CYST EXCISION     SHOULDER ARTHROSCOPY Right [id number]   Dr. Mauri Pole   STOMACH SURGERY  1941   3 arteries leaking in stomach after appendectomy   TONSILLECTOMY      Family History  Problem Relation Age of Onset   Rheum arthritis Father    Diabetes Father    Pneumonia Father    Pneumonia Mother    Diabetes Sister    Osteoarthritis Sister    Breast cancer Neg Hx     Social History   Tobacco Use   Smoking status: Never   Smokeless tobacco: Never  Substance Use Topics   Alcohol use: No    Alcohol/week: 0.0 standard drinks     Current Outpatient Medications:    albuterol (VENTOLIN HFA) 108 (90 Base) MCG/ACT inhaler, Inhale 2 puffs into the lungs every 4 (four) hours as needed for wheezing (takes every am and then as needed.). , Disp: , Rfl:    aspirin 81 MG tablet, Take 81 mg by mouth daily. , Disp: , Rfl:    Calcium Carbonate-Vit D-Min ([facility] CALCIUM 600 + D/MINERALS) 600-400 MG-UNIT TABS, Take 1 tablet by mouth daily., Disp: , Rfl: 1   cholecalciferol (VITAMIN D3) 25 MCG (1000 UNIT) tablet, Take 1,000 Units by mouth daily., Disp: , Rfl:    Cinnamon 500 MG capsule, Take 1,000 mg by mouth daily. , Disp: , Rfl:    cyanocobalamin 100 MCG tablet, Take 100 mcg by mouth 2 (two) times a week. , Disp: , Rfl:    Cysteamine Bitartrate (PROCYSBI) 300 MG PACK, Use 1 each 3 (three) times daily Use as instructed., Disp: , Rfl:    dicyclomine (BENTYL) 20 MG tablet, TAKE 1 TABLET FOUR TIMES A DAY BEFORE MEALS AND AT BEDTIME, Disp: 360 tablet, Rfl: 3   fluticasone (FLONASE) 50 MCG/ACT nasal spray, USE 2 SPRAYS IN EACH NOSTRIL AT BEDTIME, Disp: 48 g, Rfl: 3   folic acid (FOLVITE) 1 MG tablet, Take 1 mg by mouth daily. , Disp: , Rfl:    ipratropium (ATROVENT) 0.03 % nasal spray, Place 2 sprays into both nostrils every 12 (twelve) hours., Disp: 90 mL, Rfl: 1   methotrexate (RHEUMATREX) 2.5 MG tablet, Take 20 mg by mouth once a  week. Caution:Chemotherapy. Protect from light., Disp: , Rfl:    montelukast (SINGULAIR) 10 MG tablet, Take 10 mg by mouth every evening., Disp: , Rfl:    Multiple Vitamins-Minerals (MULTIVITAMIN ADULTS PO), Take 1 tablet by mouth daily. , Disp: , Rfl:    olmesartan (BENICAR) 20 MG tablet, TAKE 1 TABLET DAILY, Disp: 90 tablet, Rfl: 0   ONE TOUCH ULTRA TEST test strip, TEST 2 (TWO) TIMES DAILY. DX: E11.69, E11.43. *INS.ONLY PAYS FOR ONCE DAILY TESTING*, Disp: 100 each, Rfl: 2   pravastatin (PRAVACHOL) 40 MG tablet, TAKE 1 TABLET  DAILY  (DISCONTINUE 80MG), Disp: 90 tablet, Rfl: 0   predniSONE (DELTASONE) 2.5 MG tablet, Take 2.5 mg by mouth daily., Disp: , Rfl:    predniSONE (DELTASONE) 5 MG tablet, Take 5 mg by mouth daily., Disp: , Rfl:    Propylene Glycol (SYSTANE BALANCE OP), Apply to eye. Only as needed, Disp: , Rfl:    spironolactone-hydrochlorothiazide (ALDACTAZIDE) 25-25 MG tablet, TAKE ONE-HALF (1/2) TABLET DAILY, Disp: 45 tablet, Rfl: 2   vitamin C (ASCORBIC ACID) 500 MG tablet, Take 1 tablet by mouth at bedtime. , Disp: , Rfl:    budesonide-formoterol (SYMBICORT) 160-4.5 MCG/ACT inhaler, Inhale 2 puffs into the lungs every morning., Disp: , Rfl:    glipiZIDE (GLUCOTROL XL) 2.5 MG 24 hr tablet, Take 1 tablet (2.5 mg total) by mouth every morning., Disp: 90 tablet, Rfl: 1   glipiZIDE (GLUCOTROL XL) 5 MG 24 hr tablet, Take 1 tablet (5 mg total) by mouth every morning., Disp: 90 tablet, Rfl: 1   metFORMIN (GLUCOPHAGE XR) 750 MG 24 hr tablet, Take 2 tablets (1,500 mg total) by mouth daily with breakfast., Disp: 180 tablet, Rfl: 1  Allergies  Allergen Reactions   Doxycycline Other (See Comments)    Weakness, flu like symptoms   Codeine     Other reaction(s): Other (See Comments) Dizzy   Lantus  [Insulin Glargine]     made skin hot   Penicillin G     Other reaction(s): Unknown   Rosuvastatin     Myalgia    Sitagliptin    Sulfa Antibiotics     Other reaction(s): Other (See  Comments) Red burning skin   Wound Dressing Adhesive Rash    Surgical tape adhesive    I personally reviewed active problem list, medication list, allergies, family history, social history, health maintenance with the patient/caregiver today.   ROS  Constitutional: Negative for fever or weight change.  Respiratory: Negative for cough and shortness of breath.   Cardiovascular: Negative for chest pain or palpitations.  Gastrointestinal: Negative for abdominal pain, no bowel changes.  Musculoskeletal: Negative for gait problem or joint swelling.  Skin: Negative for rash.  Neurological: Negative for dizziness or headache.  No other specific complaints in a complete review of systems (except as listed in HPI above).   Objective  Vitals:   02/28/21 1330  BP: 134/70  Pulse: 86  Resp: 16  Temp: 98.3 F (36.8 C)  SpO2: 98%  Weight: 185 lb (83.9 kg)  Height: 5' 6"  (1.676 m)    Body mass index is 29.86 kg/m.  Physical Exam  Constitutional: Patient appears well-developed and well-nourished. Overweight.  No distress.  HEENT: head atraumatic, normocephalic, pupils equal and reactive to light, neck supple Cardiovascular: Normal rate, regular rhythm and normal heart sounds.  No murmur heard. No BLE edema. Pulmonary/Chest: Effort normal and breath sounds normal. No respiratory distress. Abdominal: Soft.  There is no tenderness. Psychiatric: Patient has a normal mood and affect. behavior is normal. Judgment and thought content normal.   Recent Results (from the past 2160 hour(s))  D-dimer, quantitative     Status: Abnormal   Collection Time: 12/18/20  3:15 PM  Result Value Ref Range   D-Dimer, Quant 0.57 (H) 0.00 - 0.50 ug/mL-FEU    Comment: (NOTE) At the manufacturer cut-off value of 0.5 g/mL FEU, this assay has a negative predictive value of 95-100%.This assay is intended for use in conjunction with a clinical pretest probability (PTP) assessment model to exclude pulmonary  embolism (PE) and deep venous  thrombosis (DVT) in outpatients suspected of PE or DVT. Results should be correlated with clinical presentation. Performed at [facility], [address] [place], [place] [address]   I-STAT creatinine     Status: None   Collection Time: 12/19/20  6:06 PM  Result Value Ref Range   Creatinine, Ser 0.90 0.44 - 1.00 mg/dL  POCT HgB A1C     Status: Abnormal   Collection Time: 02/28/21  1:42 PM  Result Value Ref Range   Hemoglobin A1C 7.4 (A) 4.0 - 5.6 %   HbA1c POC (<> result, manual entry)     HbA1c, POC (prediabetic range)     HbA1c, POC (controlled diabetic range)       PHQ2/9: Depression screen [facility] 2/9 02/28/2021 10/24/2020 05/31/2020 01/28/2020 12/08/2019  Decreased Interest 1 0 0 0 0  Down, Depressed, Hopeless 1 0 0 0 0  PHQ - 2 Score 2 0 0 0 0  Altered sleeping 0 - - 0 0  Tired, decreased energy 0 - - 0 1  Change in appetite 3 - - 0 0  Feeling bad or failure about yourself  0 - - 0 0  Trouble concentrating 0 - - 0 0  Moving slowly or fidgety/restless 0 - - 0 0  Suicidal thoughts 0 - - 0 0  PHQ-9 Score 5 - - 0 1  Difficult doing work/chores - - - - Not difficult at all  Some recent data might be hidden    phq 9 is positive, she states more like frustration, cannot walk well, difficulty driving at night   Fall Risk: Fall Risk  02/28/2021 10/24/2020 05/31/2020 01/28/2020 12/08/2019  Falls in the past year? 0 0 0 0 0  Comment - - - - -  Number falls in past yr: 0 0 0 0 -  Injury with Fall? 0 0 0 0 -  Comment - - - - -  Risk for fall due to : Impaired balance/gait - - - -  Follow up Falls prevention discussed - - - -      Functional Status Survey: Is the patient deaf or have difficulty hearing?: Yes Does the patient have difficulty seeing, even when wearing glasses/contacts?: Yes Does the patient have difficulty concentrating, remembering, or making decisions?: No Does the patient have difficulty walking or climbing stairs?: Yes Does the  patient have difficulty dressing or bathing?: Yes Does the patient have difficulty doing errands alone such as visiting a doctor's office or shopping?: Yes    Assessment & Plan  1. Diabetes mellitus type 2 in obese (HCC)  - POCT HgB A1C - Microalbumin / creatinine urine ratio - COMPLETE METABOLIC PANEL WITH GFR - metFORMIN (GLUCOPHAGE XR) 750 MG 24 hr tablet; Take 2 tablets (1,500 mg total) by mouth daily with breakfast.  Dispense: 180 tablet; Refill: 1 - glipiZIDE (GLUCOTROL XL) 2.5 MG 24 hr tablet; Take 1 tablet (2.5 mg total) by mouth every morning.  Dispense: 90 tablet; Refill: 1 - glipiZIDE (GLUCOTROL XL) 5 MG 24 hr tablet; Take 1 tablet (5 mg total) by mouth every morning.  Dispense: 90 tablet; Refill: 1  2. Need for immunization against influenza  - Flu Vaccine QUAD High Dose(Fluad)  3. Psoriatic arthritis ([place])   4. 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 Stage 3a chronic kidney disease (HCC)  - Microalbumin / creatinine urine ratio - COMPLETE METABOLIC PANEL WITH GFR  6. Dyslipidemia  - Lipid panel  7. Dyslipidemia associated with type 2  diabetes mellitus ([place])   8. Benign hypertension with chronic kidney disease, stage III ([place])   9. DDD (degenerative disc disease), lumbar   10. Obstructive apnea   11. Leukocytosis, unspecified type  - CBC with Differential/Platelet  12. Dysthymia  Discussed duloxetine but she refused today   13. Gait difficulty  - Ambulatory referral to Physical Therapy

## 2021-03-01 LAB — CBC WITH DIFFERENTIAL/PLATELET
Absolute Monocytes: 492 cells/uL (ref 200–950)
Basophils Absolute: 84 cells/uL (ref 0–200)
Basophils Relative: 0.7 %
Eosinophils Absolute: 36 cells/uL (ref 15–500)
Eosinophils Relative: 0.3 %
HCT: 40.7 % (ref 35.0–45.0)
Hemoglobin: 14 g/dL (ref 11.7–15.5)
Lymphs Abs: 1680 cells/uL (ref 850–3900)
MCH: 32.9 pg (ref 27.0–33.0)
MCHC: 34.4 g/dL (ref 32.0–36.0)
MCV: 95.8 fL (ref 80.0–100.0)
MPV: 9.8 fL (ref 7.5–12.5)
Monocytes Relative: 4.1 %
Neutro Abs: 9708 cells/uL — ABNORMAL HIGH (ref 1500–7800)
Neutrophils Relative %: 80.9 %
Platelets: 298 10*3/uL (ref 140–400)
RBC: 4.25 10*6/uL (ref 3.80–5.10)
RDW: 12.7 % (ref 11.0–15.0)
Total Lymphocyte: 14 %
WBC: 12 10*3/uL — ABNORMAL HIGH (ref 3.8–10.8)

## 2021-03-01 LAB — COMPLETE METABOLIC PANEL WITH GFR
AG Ratio: 2.3 (calc) (ref 1.0–2.5)
ALT: 17 U/L (ref 6–29)
AST: 20 U/L (ref 10–35)
Albumin: 4.5 g/dL (ref 3.6–5.1)
Alkaline phosphatase (APISO): 64 U/L (ref 37–153)
BUN: 19 mg/dL (ref 7–25)
CO2: 26 mmol/L (ref 20–32)
Calcium: 9.8 mg/dL (ref 8.6–10.4)
Chloride: 102 mmol/L (ref 98–110)
Creat: 0.97 mg/dL (ref 0.60–1.00)
Globulin: 2 g/dL (calc) (ref 1.9–3.7)
Glucose, Bld: 162 mg/dL — ABNORMAL HIGH (ref 65–99)
Potassium: 4 mmol/L (ref 3.5–5.3)
Sodium: 140 mmol/L (ref 135–146)
Total Bilirubin: 0.5 mg/dL (ref 0.2–1.2)
Total Protein: 6.5 g/dL (ref 6.1–8.1)
eGFR: 61 mL/min/{1.73_m2} (ref >=60)

## 2021-03-01 LAB — MICROALBUMIN / CREATININE URINE RATIO
Creatinine, Urine: 69 mg/dL (ref 20–275)
Microalb Creat Ratio: 17 mcg/mg creat (ref <30)
Microalb, Ur: 1.2 mg/dL

## 2021-03-01 LAB — LIPID PANEL
Cholesterol: 190 mg/dL (ref <200)
HDL: 73 mg/dL (ref >=50)
LDL Cholesterol (Calc): 94 mg/dL (calc)
Non-HDL Cholesterol (Calc): 117 mg/dL (calc) (ref <130)
Total CHOL/HDL Ratio: 2.6 (calc) (ref <5.0)
Triglycerides: 124 mg/dL (ref <150)

## 2021-03-02 ENCOUNTER — Encounter: Payer: Self-pay | Admitting: Family Medicine

## 2021-03-05 ENCOUNTER — Telehealth: Payer: Self-pay | Admitting: Family Medicine

## 2021-03-05 DIAGNOSIS — I10 Essential (primary) hypertension: Secondary | ICD-10-CM

## 2021-03-06 NOTE — Telephone Encounter (Signed)
Pt is scheduled °

## 2021-03-08 ENCOUNTER — Encounter: Payer: Self-pay | Admitting: Family Medicine

## 2021-04-22 ENCOUNTER — Encounter: Payer: Self-pay | Admitting: Family Medicine

## 2021-04-24 ENCOUNTER — Telehealth: Payer: Self-pay

## 2021-04-24 NOTE — Telephone Encounter (Signed)
Copied from [place] [id number]. Topic: Referral - Request for Referral >> Apr 24, 2021 11:04 AM Oneta Rack wrote: Gwendolyn Fill from [facility]  would like PCP to send Physical Therapy referral to  [facility] please fax to # [phone] phone # [phone].

## 2021-04-24 NOTE — Telephone Encounter (Signed)
Pt.notified

## 2021-05-03 ENCOUNTER — Other Ambulatory Visit: Payer: Self-pay | Admitting: Family Medicine

## 2021-05-03 DIAGNOSIS — E1169 Type 2 diabetes mellitus with other specified complication: Secondary | ICD-10-CM

## 2021-05-03 NOTE — Telephone Encounter (Signed)
Requested Prescriptions  Pending Prescriptions Disp Refills  . pravastatin (PRAVACHOL) 40 MG tablet [Pharmacy Med Name: PRAVASTATIN TABS 40MG] 90 tablet 3    Sig: TAKE 1 TABLET DAILY  (AWAITING LABS OF NEXT VISIT FOR FURTHER REFILLS)     Cardiovascular:  Antilipid - Statins Passed - 05/03/2021  1:05 AM      Passed - Total Cholesterol in normal range and within 360 days    Cholesterol, Total  Date Value Ref Range Status  02/02/2020 177 100 - 199 mg/dL Final   Cholesterol  Date Value Ref Range Status  02/28/2021 190 <200 mg/dL Final         Passed - LDL in normal range and within 360 days    LDL Cholesterol (Calc)  Date Value Ref Range Status  02/28/2021 94 mg/dL (calc) Final    Comment:    Reference range: <100 . Desirable range <100 mg/dL for primary prevention;   <70 mg/dL for patients with CHD or diabetic patients  with > or = 2 CHD risk factors. Marland Kitchen LDL-C is now calculated using the Martin-Hopkins  calculation, which is a validated novel method providing  better accuracy than the Friedewald equation in the  estimation of LDL-C.  Cresenciano Genre et al. Annamaria Helling. 4010;272(53): 2061-2068  (http://education.[employer].com/faq/FAQ164)          Passed - HDL in normal range and within 360 days    HDL  Date Value Ref Range Status  02/28/2021 73 > OR = 50 mg/dL Final  02/02/2020 69 >39 mg/dL Final         Passed - Triglycerides in normal range and within 360 days    Triglycerides  Date Value Ref Range Status  02/28/2021 124 <150 mg/dL Final         Passed - Patient is not pregnant      Passed - Valid encounter within last 12 months    Recent Outpatient Visits          2 months ago Diabetes mellitus type 2 in obese [facility])   [facility] [place], Drue Stager, MD   6 months ago Diabetes mellitus type 2 in obese [facility])   [facility], Drue Stager, MD   11 months ago Diabetes mellitus type 2 in obese [facility])   [facility]  Steele Sizer, MD   1 year ago Diabetes mellitus type 2 in obese [facility])   [facility] Steele Sizer, MD   1 year ago Sore throat   [facility] Steele Sizer, MD      Future Appointments            In 3 months Ancil Boozer, Drue Stager, MD [facility], [facility]

## 2021-05-08 ENCOUNTER — Other Ambulatory Visit: Payer: Self-pay

## 2021-05-08 ENCOUNTER — Ambulatory Visit (INDEPENDENT_AMBULATORY_CARE_PROVIDER_SITE_OTHER): Payer: Medicare Other | Admitting: Podiatry

## 2021-05-08 ENCOUNTER — Ambulatory Visit (INDEPENDENT_AMBULATORY_CARE_PROVIDER_SITE_OTHER): Payer: Medicare Other

## 2021-05-08 DIAGNOSIS — M79671 Pain in right foot: Secondary | ICD-10-CM

## 2021-05-08 NOTE — Progress Notes (Signed)
   HPI: 75 y.o. female presenting today for evaluation of right foot pain that began Saturday, 05/05/2021 when she woke up in the morning.  Patient states that she had excruciating arch pain.  She denies injury.  She has not done anything for treatment except for icing it.  Over the last 3 days the foot has gotten tremendously better and currently there is minimal pain to the foot.  She would like to have it evaluated  Past Medical History:  Diagnosis Date   Achilles tendinitis of right lower extremity    Allergy    Asthma    Moderate   Chronic back pain    Depression    Diabetes mellitus without complication ([place])    Gastroparesis    DM   Hyperlipidemia    Hypertension    Hypoxemia    Irritable bowel syndrome    Maculopathy    NASH (nonalcoholic steatohepatitis)    Nuclear sclerosis of both eyes    OA (osteoarthritis)    Hips   OSA (obstructive sleep apnea)    Post-void dribbling    Psoriasis    Regurgitation    SOB (shortness of breath)    Tear of left rotator cuff    TMJ (dislocation of temporomandibular joint)    Trochanteric bursitis of right hip      Physical Exam: General: The patient is alert and oriented x3 in no acute distress.  Dermatology: Skin is warm, dry and supple bilateral lower extremities. Negative for open lesions or macerations.  Vascular: Palpable pedal pulses bilaterally. No edema or erythema noted. Capillary refill within normal limits.  Neurological: Epicritic and protective threshold grossly intact bilaterally.   Musculoskeletal Exam: No pedal deformities noted.  There is minimal pain with palpation throughout the entire foot and ankle  Radiographic Exam:  Normal osseous mineralization.  Diffuse degenerative changes noted consistent with arthritis DJD.  No fractures identified  Assessment: 1.  Right foot pain x3 days; mostly resolved   Plan of Care:  1. Patient evaluated. X-Rays reviewed.  2.  Explained to the patient that I am unsure  what may have caused the patient's pain initially however it has mostly resolved of the following 3 days and currently she is able to ambulate without pain 3.  Recommend good supportive shoes and sneakers 4.  Return to clinic as needed      Edrick Kins, DPM [facility]  Dr. Edrick Kins, DPM    [address], [place] [address]                Office [phone]  Fax [phone]

## 2021-06-26 LAB — HM DIABETES EYE EXAM

## 2021-06-27 ENCOUNTER — Encounter: Payer: Self-pay | Admitting: Family Medicine

## 2021-06-29 ENCOUNTER — Other Ambulatory Visit: Payer: Self-pay | Admitting: Family Medicine

## 2021-06-29 ENCOUNTER — Encounter: Payer: Self-pay | Admitting: Family Medicine

## 2021-06-29 DIAGNOSIS — Z1231 Encounter for screening mammogram for malignant neoplasm of breast: Secondary | ICD-10-CM

## 2021-07-11 ENCOUNTER — Encounter: Payer: Self-pay | Admitting: Ophthalmology

## 2021-07-12 ENCOUNTER — Other Ambulatory Visit: Payer: Self-pay | Admitting: Family Medicine

## 2021-07-12 ENCOUNTER — Other Ambulatory Visit: Payer: Self-pay

## 2021-07-12 ENCOUNTER — Encounter: Payer: Self-pay | Admitting: Family Medicine

## 2021-07-13 ENCOUNTER — Other Ambulatory Visit: Payer: Self-pay | Admitting: Family Medicine

## 2021-07-13 ENCOUNTER — Other Ambulatory Visit: Payer: Self-pay

## 2021-07-13 MED ORDER — GLUCOSE BLOOD VI STRP: 1.0000 | ORAL_STRIP | Freq: Two times a day (BID) | 3 refills | Status: DC | PRN

## 2021-07-16 ENCOUNTER — Telehealth: Payer: Self-pay | Admitting: Family Medicine

## 2021-07-16 NOTE — Telephone Encounter (Signed)
Medication: ONE TOUCH ULTRA TEST test strip [[id number]]   Pt is wanting sig to state 1 time a day  Has the patient contacted their pharmacy? YES  (Agent: If no, request that the patient contact the pharmacy for the refill. If patient does not wish to contact the pharmacy document the reason why and proceed with request.) (Agent: If yes, when and what did the pharmacy advise?)  Preferred Pharmacy (with phone number or street name): [facility] [address] [place], [place], [address] [phone] Has the patient been seen for an appointment in the last year OR does the patient have an upcoming appointment? YES   Agent: Please be advised that RX refills may take up to 3 business days. We ask that you follow-up with your pharmacy.

## 2021-07-16 NOTE — Telephone Encounter (Signed)
Requested medications are due for refill today.  no  Requested medications are on the active medications list.  yes  Last refill. 07/16/2021  Future visit scheduled.   yes  Notes to clinic.  Pt request, "Pt is wanting sig to state 1 time a day".    Requested Prescriptions  Pending Prescriptions Disp Refills   glucose blood test strip 200 each 3    Sig: 1 each by Other route 2 (two) times daily as needed for other. Check fsbs up to BID     Endocrinology: Diabetes - Testing Supplies Passed - 07/16/2021  9:17 PM      Passed - Valid encounter within last 12 months    Recent Outpatient Visits           4 months ago Diabetes mellitus type 2 in obese [facility])   [facility] Steele Sizer, MD   8 months ago Diabetes mellitus type 2 in obese [facility])   [facility] Steele Sizer, MD   1 year ago Diabetes mellitus type 2 in obese [facility])   [facility] Steele Sizer, MD   1 year ago Diabetes mellitus type 2 in obese [facility])   [facility] Steele Sizer, MD   1 year ago Sore throat   [facility] Steele Sizer, MD       Future Appointments             In 1 month Ancil Boozer, Drue Stager, MD [facility], [facility]

## 2021-07-17 ENCOUNTER — Other Ambulatory Visit: Payer: Self-pay

## 2021-07-17 ENCOUNTER — Other Ambulatory Visit: Payer: Self-pay | Admitting: Family Medicine

## 2021-07-17 MED ORDER — GLUCOSE BLOOD VI STRP: 1.0000 | ORAL_STRIP | Freq: Every day | 3 refills | Status: DC

## 2021-07-17 NOTE — Telephone Encounter (Signed)
Pt called in to follow up on refill request. Pt is very frustrated that she has  not received her Rx. Pt says that she is out of her test strips. Pt says that she has requested several times.    [facility] [address] [place], [place], [address] [phone]

## 2021-07-18 ENCOUNTER — Other Ambulatory Visit: Payer: Self-pay | Admitting: Family Medicine

## 2021-07-18 DIAGNOSIS — E1169 Type 2 diabetes mellitus with other specified complication: Secondary | ICD-10-CM

## 2021-07-18 LAB — CBC AND DIFFERENTIAL
HCT: 41 (ref 36–46)
Hemoglobin: 13.7 (ref 12.0–16.0)
Neutrophils Absolute: 11.8
WBC: 13.8

## 2021-07-18 LAB — BASIC METABOLIC PANEL
BUN: 18 (ref 4–21)
CO2: 30 — AB (ref 13–22)
Chloride: 100 (ref 99–108)
Creatinine: 0.9 (ref <=1.1)
Glucose: 146
Potassium: 3.6 (ref 3.4–5.3)
Sodium: 139 (ref 137–147)

## 2021-07-18 LAB — HEPATIC FUNCTION PANEL
ALT: 19 (ref 7–35)
AST: 19 (ref 13–35)
Alkaline Phosphatase: 80 (ref 25–125)
Bilirubin, Total: 0.3

## 2021-07-18 LAB — COMPREHENSIVE METABOLIC PANEL
Albumin: 4.1 (ref 3.5–5.0)
Calcium: 9.5 (ref 8.7–10.7)
Globulin: 2.5
eGFR: 56

## 2021-07-18 LAB — CBC: RBC: 4.35 (ref 3.87–5.11)

## 2021-07-18 MED ORDER — GLUCOSE BLOOD VI STRP: 1.0000 | ORAL_STRIP | Freq: Every day | 2 refills | Status: DC

## 2021-07-19 ENCOUNTER — Encounter: Payer: Self-pay | Admitting: Family Medicine

## 2021-07-19 ENCOUNTER — Ambulatory Visit: Payer: Self-pay | Admitting: *Deleted

## 2021-07-19 ENCOUNTER — Telehealth: Payer: Self-pay | Admitting: Family Medicine

## 2021-07-19 DIAGNOSIS — E1169 Type 2 diabetes mellitus with other specified complication: Secondary | ICD-10-CM

## 2021-07-19 LAB — POCT ERYTHROCYTE SEDIMENTATION RATE, NON-AUTOMATED: Sed Rate: 8

## 2021-07-19 MED ORDER — GLUCOSE BLOOD VI STRP: 1.0000 | ORAL_STRIP | Freq: Every day | 2 refills | Status: DC

## 2021-07-19 NOTE — Telephone Encounter (Signed)
Pt is calling to report that the pharmacy is in need of a dx code to complete   glucose blood (ONE TOUCH ULTRA TEST) test strip [[id number]]    Pt states that she has been out of strips for one week and she has not been able to test her sugar.  Please advise  [phone]

## 2021-07-19 NOTE — Telephone Encounter (Addendum)
Summary: Pt dizzy and lightheaded   Pt is calling to report that she is light headed and dizzy for a few days. Pt is out of her test strips. And called upset that the pharmacy is awaiting at dx code on her test strips please advise     Call to patient- she does not -refuses triage- she just wants her test strips. Patient is very upset- she states the pharmacy first told her they were on back order- and now they are saying they need diagnosis code. Assured patient I would call pharmacy for her- she still refuses triage. Call to pharmacy- they need Rx electronically sent or written- Rx was sent yesterday by office- [facility] Original Rx was marked print- unable to send- will notify office.

## 2021-07-20 ENCOUNTER — Other Ambulatory Visit: Payer: Self-pay | Admitting: Family Medicine

## 2021-07-20 ENCOUNTER — Telehealth: Payer: Self-pay

## 2021-07-20 NOTE — Telephone Encounter (Signed)
Copied from [place] [id number]. Topic: Quick Communication - Rx Refill/Question >> Jul 19, 2021  5:51 PM Loma Boston wrote: glucose blood (ONE TOUCH ULTRA TEST) test strip 100 each 2 07/19/2021   Sig - Route: 1 each by Other route daily. Check fsbs once a day  E11.69, E66.9 - Other  Class: Print  Notes to Pharmacy  Has the patient contacted their pharmacy? Yes.   (Agent: If no, request that the patient contact the pharmacy for the refill. If patient does not wish to contact the pharmacy document the reason why and proceed with request.) (Agent: If yes, when and what did the pharmacy advise?) Pt advised that strips would be in tomorrow but pt is extremely mad and upset on the phone arguing with husband, states they are lying to her, have been all week and that she just wants a new script sent to [facility] b/c cause they are all liars  Preferred Pharmacy: [facility] [place], [place] [address] [place] [address] Phone: [phone] Fax: [phone]  Tried to encourage pt to wait till tomorrow but insist on having Dr Ancil Boozer resend to [facility] cancel other scripts   Has the patient been seen for an appointment in the last year OR does the patient have an upcoming appointment? Yes.    Agent: Please be advised that RX refills may take up to 3 business days. We ask that you follow-up with your pharmacy.

## 2021-07-20 NOTE — Discharge Instructions (Signed)

## 2021-07-24 ENCOUNTER — Encounter
Admission: RE | Disposition: A | Discharge status: Home / Self Care | Payer: Self-pay | Source: Home / Self Care | Attending: Ophthalmology

## 2021-07-24 ENCOUNTER — Encounter: Payer: Self-pay | Admitting: Family Medicine

## 2021-07-24 ENCOUNTER — Ambulatory Visit: Payer: Medicare Other | Admitting: Anesthesiology

## 2021-07-24 ENCOUNTER — Other Ambulatory Visit: Payer: Self-pay

## 2021-07-24 ENCOUNTER — Ambulatory Visit
Admission: RE | Admit: 2021-07-24 | Discharge: 2021-07-24 | Disposition: A | Discharge status: Home / Self Care | Payer: Medicare Other | Attending: Ophthalmology | Admitting: Ophthalmology

## 2021-07-24 ENCOUNTER — Encounter: Payer: Self-pay | Admitting: Ophthalmology

## 2021-07-24 DIAGNOSIS — M1990 Unspecified osteoarthritis, unspecified site: Secondary | ICD-10-CM | POA: Insufficient documentation

## 2021-07-24 DIAGNOSIS — Z7984 Long term (current) use of oral hypoglycemic drugs: Secondary | ICD-10-CM | POA: Insufficient documentation

## 2021-07-24 DIAGNOSIS — H2512 Age-related nuclear cataract, left eye: Secondary | ICD-10-CM | POA: Diagnosis present

## 2021-07-24 DIAGNOSIS — F32A Depression, unspecified: Secondary | ICD-10-CM | POA: Diagnosis not present

## 2021-07-24 DIAGNOSIS — Z79899 Other long term (current) drug therapy: Secondary | ICD-10-CM | POA: Insufficient documentation

## 2021-07-24 DIAGNOSIS — J45909 Unspecified asthma, uncomplicated: Secondary | ICD-10-CM | POA: Diagnosis not present

## 2021-07-24 DIAGNOSIS — I10 Essential (primary) hypertension: Secondary | ICD-10-CM | POA: Insufficient documentation

## 2021-07-24 DIAGNOSIS — K3184 Gastroparesis: Secondary | ICD-10-CM | POA: Insufficient documentation

## 2021-07-24 DIAGNOSIS — Z7951 Long term (current) use of inhaled steroids: Secondary | ICD-10-CM | POA: Diagnosis not present

## 2021-07-24 DIAGNOSIS — K7581 Nonalcoholic steatohepatitis (NASH): Secondary | ICD-10-CM | POA: Insufficient documentation

## 2021-07-24 DIAGNOSIS — Z7952 Long term (current) use of systemic steroids: Secondary | ICD-10-CM | POA: Insufficient documentation

## 2021-07-24 DIAGNOSIS — G4730 Sleep apnea, unspecified: Secondary | ICD-10-CM | POA: Insufficient documentation

## 2021-07-24 DIAGNOSIS — E1143 Type 2 diabetes mellitus with diabetic autonomic (poly)neuropathy: Secondary | ICD-10-CM | POA: Diagnosis not present

## 2021-07-24 HISTORY — PX: CATARACT EXTRACTION W/PHACO: SHX586

## 2021-07-24 HISTORY — DX: Presence of external hearing-aid: Z97.4

## 2021-07-24 LAB — GLUCOSE, CAPILLARY
Glucose-Capillary: 93 mg/dL (ref 70–99)
Glucose-Capillary: 85 mg/dL (ref 70–99)

## 2021-07-24 SURGERY — PHACOEMULSIFICATION, CATARACT, WITH IOL INSERTION
Anesthesia: Monitor Anesthesia Care | Site: Eye | Laterality: Left

## 2021-07-24 MED ORDER — SIGHTPATH DOSE#1 NA CHONDROIT SULF-NA HYALURON 40-17 MG/ML IO SOLN
INTRAOCULAR | Status: DC | PRN
  Administered 2021-07-24: 1 mL via INTRAOCULAR

## 2021-07-24 MED ORDER — SIGHTPATH DOSE#1 BSS IO SOLN
INTRAOCULAR | Status: DC | PRN
  Administered 2021-07-24: 15 mL

## 2021-07-24 MED ORDER — ARMC OPHTHALMIC DILATING DROPS
1.0000 "application " | OPHTHALMIC | Status: DC | PRN
  Administered 2021-07-24 (×3): 1 via OPHTHALMIC

## 2021-07-24 MED ORDER — MIDAZOLAM HCL 2 MG/2ML IJ SOLN
INTRAMUSCULAR | Status: DC | PRN
  Administered 2021-07-24: 2 mg via INTRAVENOUS

## 2021-07-24 MED ORDER — TETRACAINE HCL 0.5 % OP SOLN
1.0000 [drp] | OPHTHALMIC | Status: DC | PRN
  Administered 2021-07-24 (×3): 1 [drp] via OPHTHALMIC

## 2021-07-24 MED ORDER — ACETAMINOPHEN 325 MG PO TABS: 325.0000 mg | ORAL_TABLET | ORAL | Status: DC | PRN

## 2021-07-24 MED ORDER — MOXIFLOXACIN HCL 0.5 % OP SOLN
OPHTHALMIC | Status: DC | PRN
  Administered 2021-07-24: 0.2 mL via OPHTHALMIC

## 2021-07-24 MED ORDER — LACTATED RINGERS IV SOLN: INTRAVENOUS | Status: DC

## 2021-07-24 MED ORDER — SIGHTPATH DOSE#1 BSS IO SOLN
INTRAOCULAR | Status: DC | PRN
  Administered 2021-07-24: 58 mL via OPHTHALMIC

## 2021-07-24 MED ORDER — SIGHTPATH DOSE#1 BSS IO SOLN
INTRAOCULAR | Status: DC | PRN
  Administered 2021-07-24: 1 mL via INTRAMUSCULAR

## 2021-07-24 MED ORDER — BRIMONIDINE TARTRATE-TIMOLOL 0.2-0.5 % OP SOLN
OPHTHALMIC | Status: DC | PRN
Start: 2021-07-24 — End: 2021-07-24
  Administered 2021-07-24: 1 [drp] via OPHTHALMIC

## 2021-07-24 MED ORDER — ACETAMINOPHEN 160 MG/5ML PO SOLN: 325.0000 mg | ORAL | Status: DC | PRN

## 2021-07-24 MED ORDER — ONDANSETRON HCL 4 MG/2ML IJ SOLN: 4.0000 mg | Freq: Once | INTRAMUSCULAR | Status: DC | PRN

## 2021-07-24 MED ORDER — FENTANYL CITRATE (PF) 100 MCG/2ML IJ SOLN
INTRAMUSCULAR | Status: DC | PRN
  Administered 2021-07-24 (×2): 50 ug via INTRAVENOUS

## 2021-07-24 SURGICAL SUPPLY — 10 items
CATARACT SUITE SIGHTPATH (MISCELLANEOUS) ×2 IMPLANT
FEE CATARACT SUITE SIGHTPATH (MISCELLANEOUS) ×1 IMPLANT
GLOVE SURG ENC TEXT LTX SZ8 (GLOVE) ×2 IMPLANT
GLOVE SURG TRIUMPH 8.0 PF LTX (GLOVE) ×2 IMPLANT
LENS IOL TECNIS EYHANCE 18.5 (Intraocular Lens) ×1 IMPLANT
NDL FILTER BLUNT 18X1 1/2 (NEEDLE) ×1 IMPLANT
NEEDLE FILTER BLUNT 18X 1/2SAF (NEEDLE) ×1
NEEDLE FILTER BLUNT 18X1 1/2 (NEEDLE) ×1 IMPLANT
SYR 3ML LL SCALE MARK (SYRINGE) ×2 IMPLANT
WATER STERILE IRR 250ML POUR (IV SOLUTION) ×2 IMPLANT

## 2021-07-24 NOTE — Anesthesia Procedure Notes (Signed)
Procedure Name: MAC Date/Time: 07/24/2021 7:34 AM Performed by: Jeannene Patella, CRNA Pre-anesthesia Checklist: Patient identified, Emergency Drugs available, Suction available, Timeout performed and Patient being monitored Patient Re-evaluated:Patient Re-evaluated prior to induction Oxygen Delivery Method: Nasal cannula Placement Confirmation: positive ETCO2

## 2021-07-24 NOTE — Anesthesia Postprocedure Evaluation (Signed)
Anesthesia Post Note  Patient: Mary Cantrell  Procedure(s) Performed: CATARACT EXTRACTION PHACO AND INTRAOCULAR LENS PLACEMENT (IOC) LEFT DIABETIC 8.37 00:50.0 (Left: Eye)     Patient location during evaluation: PACU Anesthesia Type: MAC Level of consciousness: awake Pain management: pain level controlled Vital Signs Assessment: post-procedure vital signs reviewed and stable Respiratory status: respiratory function stable Cardiovascular status: stable Postop Assessment: no apparent nausea or vomiting Anesthetic complications: no   No notable events documented.  Veda Canning

## 2021-07-24 NOTE — Op Note (Signed)
PREOPERATIVE DIAGNOSIS:  Nuclear sclerotic cataract of the left eye.   POSTOPERATIVE DIAGNOSIS:  Nuclear sclerotic cataract of the left eye.   OPERATIVE PROCEDURE:ORPROCALL@   SURGEON:  Birder Robson, MD.   ANESTHESIA:  Anesthesiologist: Veda Canning, MD CRNA: Jeannene Patella, CRNA  1.      Managed anesthesia care. 2.     0.53m of Shugarcaine was instilled following the paracentesis   COMPLICATIONS:  None.   TECHNIQUE:   Stop and chop   DESCRIPTION OF PROCEDURE:  The patient was examined and consented in the preoperative holding area where the aforementioned topical anesthesia was applied to the left eye and then brought back to the Operating Room where the left eye was prepped and draped in the usual sterile ophthalmic fashion and a lid speculum was placed. A paracentesis was created with the side port blade and the anterior chamber was filled with viscoelastic. A near clear corneal incision was performed with the steel keratome. A continuous curvilinear capsulorrhexis was performed with a cystotome followed by the capsulorrhexis forceps. Hydrodissection and hydrodelineation were carried out with BSS on a blunt cannula. The lens was removed in a stop and chop  technique and the remaining cortical material was removed with the irrigation-aspiration handpiece. The capsular bag was inflated with viscoelastic and the Technis [id number] lens was placed in the capsular bag without complication. The remaining viscoelastic was removed from the eye with the irrigation-aspiration handpiece. The wounds were hydrated. The anterior chamber was flushed with BSS and the eye was inflated to physiologic pressure. 0.177mVigamox was placed in the anterior chamber. The wounds were found to be water tight. The eye was dressed with Combigan. The patient was given protective glasses to wear throughout the day and a shield with which to sleep tonight. The patient was also given drops with which to begin a drop  regimen today and will follow-up with me in one day. Implant Name Type Inv. Item Serial No. Manufacturer Lot No. LRB No. Used Action  LENS IOL TECNIS EYHANCE 18.5 - [id number] Lens LENS IOL TECNIS EYHANCE 18.5 [id number]  Left 1 Implanted    Procedure(s) with comments: CATARACT EXTRACTION PHACO AND INTRAOCULAR LENS PLACEMENT (IOC) LEFT DIABETIC 8.37 00:50.0 (Left) - Diabetic  Electronically signed: WiBirder Robson/31/2023 7:48 AM

## 2021-07-24 NOTE — H&P (Signed)
[facility]   Primary Care Physician:  Steele Sizer, MD Ophthalmologist: Dr. George Ina  Pre-Procedure History & Physical: HPI:  Mary Cantrell is a 76 y.o. female here for cataract surgery.   Past Medical History:  Diagnosis Date   Achilles tendinitis of right lower extremity    Allergy    Asthma    Moderate   Chronic back pain    Depression    Diabetes mellitus without complication (HCC)    Gastroparesis    DM   Hyperlipidemia    Hypertension    Hypoxemia    Irritable bowel syndrome    Maculopathy    NASH (nonalcoholic steatohepatitis)    Nuclear sclerosis of both eyes    OA (osteoarthritis)    Hips   OSA (obstructive sleep apnea)    CPAP   Post-void dribbling    Psoriasis    Regurgitation    SOB (shortness of breath)    Tear of left rotator cuff    TMJ (dislocation of temporomandibular joint)    Trochanteric bursitis of right hip    Wears hearing aid in both ears     Past Surgical History:  Procedure Laterality Date   ABDOMINAL HYSTERECTOMY     APPENDECTOMY     ARTERY BIOPSY N/A 02/23/2015   Procedure: BIOPSY TEMPORAL ARTERY;  Surgeon: Algernon Huxley, MD;  Location: [facility] ORS;  Service: Vascular;  Laterality: N/A;   BREAST BIOPSY Left    1990's   EXCISION / CURETTAGE BONE CYST FINGER     Left Index Finger   PILONIDAL CYST EXCISION     SHOULDER ARTHROSCOPY Right [id number]   Dr. Mauri Pole   STOMACH SURGERY  1941   3 arteries leaking in stomach after appendectomy   TONSILLECTOMY      Prior to Admission medications   Medication Sig Start Date End Date Taking? Authorizing Provider  albuterol (VENTOLIN HFA) 108 (90 Base) MCG/ACT inhaler Inhale 2 puffs into the lungs every 4 (four) hours as needed for wheezing (takes every am and then as needed.).    Yes [provider]  aspirin 81 MG tablet Take 81 mg by mouth daily.    Yes [provider]  budesonide-formoterol (SYMBICORT) 160-4.5 MCG/ACT inhaler Inhale 2 puffs into the lungs every morning.  08/17/14 07/24/21 Yes Erby Pian, MD  Calcium Carbonate-Vit D-Min ([employer] CALCIUM 600 + D/MINERALS) 600-400 MG-UNIT TABS Take 1 tablet by mouth daily. 12/07/15  Yes [provider]  cholecalciferol (VITAMIN D3) 25 MCG (1000 UNIT) tablet Take 1,000 Units by mouth daily.   Yes [provider]  Cinnamon 500 MG capsule Take 1,000 mg by mouth daily.    Yes [provider]  cyanocobalamin 100 MCG tablet Take 100 mcg by mouth 2 (two) times a week.    Yes [provider]  dicyclomine (BENTYL) 20 MG tablet TAKE 1 TABLET FOUR TIMES A DAY BEFORE MEALS AND AT BEDTIME 09/09/20  Yes Sowles, Drue Stager, MD  diphenhydrAMINE (BENADRYL) 25 MG tablet Take 50 mg by mouth at bedtime.   Yes [provider]  fluticasone (FLONASE) 50 MCG/ACT nasal spray USE 2 SPRAYS IN EACH NOSTRIL AT BEDTIME 09/18/19  Yes Sowles, Drue Stager, MD  folic acid (FOLVITE) 1 MG tablet Take 1 mg by mouth daily.  01/22/16  Yes [provider]  glipiZIDE (GLUCOTROL XL) 2.5 MG 24 hr tablet Take 1 tablet (2.5 mg total) by mouth every morning. 02/28/21  Yes Sowles, Drue Stager, MD  glipiZIDE (GLUCOTROL XL) 5 MG 24 hr  tablet Take 1 tablet (5 mg total) by mouth every morning. 02/28/21  Yes Sowles, Drue Stager, MD  ipratropium (ATROVENT) 0.03 % nasal spray Place 2 sprays into both nostrils every 12 (twelve) hours. 01/28/20  Yes Sowles, Drue Stager, MD  metFORMIN (GLUCOPHAGE XR) 750 MG 24 hr tablet Take 2 tablets (1,500 mg total) by mouth daily with breakfast. 02/28/21  Yes Sowles, Drue Stager, MD  methotrexate (RHEUMATREX) 2.5 MG tablet Take 20 mg by mouth once a week. Caution:Chemotherapy. Protect from light.   Yes Valinda Party, MD  montelukast (SINGULAIR) 10 MG tablet Take 10 mg by mouth every evening.   Yes [provider]  Multiple Vitamins-Minerals (MULTIVITAMIN ADULTS PO) Take 1 tablet by mouth daily.    Yes [provider]  olmesartan (BENICAR) 20 MG tablet TAKE 1 TABLET DAILY 03/05/21  Yes Sowles, Drue Stager,  MD  pravastatin (PRAVACHOL) 40 MG tablet TAKE 1 TABLET DAILY  (AWAITING LABS OF NEXT VISIT FOR FURTHER REFILLS) 05/03/21  Yes Sowles, Drue Stager, MD  predniSONE (DELTASONE) 2.5 MG tablet Take 2.5 mg by mouth daily. 04/21/20  Yes Valinda Party, MD  predniSONE (DELTASONE) 5 MG tablet Take 5 mg by mouth daily. 11/02/19  Yes Valinda Party, MD  Propylene Glycol (SYSTANE BALANCE OP) Apply to eye. Only as needed   Yes   spironolactone-hydrochlorothiazide (ALDACTAZIDE) 25-25 MG tablet TAKE ONE-HALF (1/2) TABLET DAILY 11/27/20  Yes Sowles, Drue Stager, MD  vitamin C (ASCORBIC ACID) 500 MG tablet Take 1 tablet by mouth at bedtime.    Yes [provider]  Vitamin E 268 MG (400 UNIT) TABS Take by mouth daily.   Yes [provider]  Cysteamine Bitartrate (PROCYSBI) 300 MG PACK Use 1 each 3 (three) times daily Use as instructed. Patient not taking: Reported on 07/11/2021 10/26/18   [provider]  glucose blood (ONE TOUCH ULTRA TEST) test strip 1 each by Other route daily. Check fsbs once a day  E11.69, E66.9 07/19/21   Steele Sizer, MD    Allergies as of 06/27/2021 - Review Complete 02/28/2021  Allergen Reaction Noted   Doxycycline Other (See Comments) 03/12/2018   Codeine  12/12/2014   Lantus  [insulin glargine]  12/21/2014   Penicillin g  04/03/2016   Rosuvastatin  05/31/2020   Sitagliptin  12/21/2014   Sulfa antibiotics  12/12/2014   Wound dressing adhesive Rash 02/24/2020    Family History  Problem Relation Age of Onset   Rheum arthritis Father    Diabetes Father    Pneumonia Father    Pneumonia Mother    Diabetes Sister    Osteoarthritis Sister    Breast cancer Neg Hx     Social History   Socioeconomic History   Marital status: Married    Spouse name: Not on file   Number of children: 2   Years of education: Not on file   Highest education level: Not on file  Occupational History   Occupation: retired  Tobacco Use   Smoking status: Never   Smokeless  tobacco: Never  Vaping Use   Vaping Use: Never used  Substance and Sexual Activity   Alcohol use: No    Alcohol/week: 0.0 standard drinks   Drug use: No   Sexual activity: Yes    Partners: Male    Comment: hysterectomy  Other Topics Concern   Not on file  Social History Narrative   Not on file   Social Determinants of Health   Financial Resource Strain: Not on file  Food Insecurity: Not  on file  Transportation Needs: Not on file  Physical Activity: Not on file  Stress: Not on file  Social Connections: Not on file  Intimate Partner Violence: Not on file    Review of Systems: See HPI, otherwise negative ROS  Physical Exam: BP (!) 150/83    Pulse 86    Temp 97.7 F (36.5 C) (Temporal)    Resp 16    Ht 5' 6"  (1.676 m)    Wt 83.9 kg    SpO2 96%    BMI 29.86 kg/m  General:   Alert, cooperative in NAD Head:  Normocephalic and atraumatic. Respiratory:  Normal work of breathing. Cardiovascular:  RRR  Impression/Plan: Mary Cantrell is here for cataract surgery.  Risks, benefits, limitations, and alternatives regarding cataract surgery have been reviewed with the patient.  Questions have been answered.  All parties agreeable.   Birder Robson, MD  07/24/2021, 7:19 AM

## 2021-07-24 NOTE — Transfer of Care (Signed)
Immediate Anesthesia Transfer of Care Note  Patient: Mary Cantrell  Procedure(s) Performed: CATARACT EXTRACTION PHACO AND INTRAOCULAR LENS PLACEMENT (IOC) LEFT DIABETIC 8.37 00:50.0 (Left: Eye)  Patient Location: PACU  Anesthesia Type: MAC  Level of Consciousness: awake, alert  and patient cooperative  Airway and Oxygen Therapy: Patient Spontanous Breathing and Patient connected to supplemental oxygen  Post-op Assessment: Post-op Vital signs reviewed, Patient's Cardiovascular Status Stable, Respiratory Function Stable, Patent Airway and No signs of Nausea or vomiting  Post-op Vital Signs: Reviewed and stable  Complications: No notable events documented.

## 2021-07-24 NOTE — Anesthesia Preprocedure Evaluation (Signed)
Anesthesia Evaluation  Patient identified by MRN, date of birth, ID band Patient awake    Reviewed: Allergy & Precautions, NPO status   Airway Mallampati: II  TM Distance: >3 FB     Dental   Pulmonary asthma , sleep apnea ,    Pulmonary exam normal        Cardiovascular hypertension,  Rhythm:Regular Rate:Normal     Neuro/Psych PSYCHIATRIC DISORDERS Depression    GI/Hepatic NASH Diabetic gastroparesis   Endo/Other  diabetes, Type 2  Renal/GU Renal InsufficiencyRenal disease     Musculoskeletal  (+) Arthritis ,   Abdominal   Peds  Hematology   Anesthesia Other Findings   Reproductive/Obstetrics                             Anesthesia Physical Anesthesia Plan  ASA: 3  Anesthesia Plan: MAC   Post-op Pain Management: Minimal or no pain anticipated   Induction: Intravenous  PONV Risk Score and Plan: TIVA, Midazolam and Treatment may vary due to age or medical condition  Airway Management Planned: Natural Airway and Nasal Cannula  Additional Equipment:   Intra-op Plan:   Post-operative Plan:   Informed Consent: I have reviewed the patients History and Physical, chart, labs and discussed the procedure including the risks, benefits and alternatives for the proposed anesthesia with the patient or authorized representative who has indicated his/her understanding and acceptance.       Plan Discussed with: CRNA  Anesthesia Plan Comments:         Anesthesia Quick Evaluation

## 2021-07-25 ENCOUNTER — Encounter: Payer: Self-pay | Admitting: Ophthalmology

## 2021-08-02 NOTE — Discharge Instructions (Signed)

## 2021-08-07 ENCOUNTER — Ambulatory Visit: Payer: Medicare Other | Admitting: Anesthesiology

## 2021-08-07 ENCOUNTER — Encounter: Payer: Self-pay | Admitting: Ophthalmology

## 2021-08-07 ENCOUNTER — Encounter
Admission: RE | Disposition: A | Discharge status: Home / Self Care | Payer: Self-pay | Source: Home / Self Care | Attending: Ophthalmology

## 2021-08-07 ENCOUNTER — Ambulatory Visit
Admission: RE | Admit: 2021-08-07 | Discharge: 2021-08-07 | Disposition: A | Discharge status: Home / Self Care | Payer: Medicare Other | Attending: Ophthalmology | Admitting: Ophthalmology

## 2021-08-07 ENCOUNTER — Other Ambulatory Visit: Payer: Self-pay

## 2021-08-07 DIAGNOSIS — K3184 Gastroparesis: Secondary | ICD-10-CM | POA: Insufficient documentation

## 2021-08-07 DIAGNOSIS — H2511 Age-related nuclear cataract, right eye: Secondary | ICD-10-CM | POA: Insufficient documentation

## 2021-08-07 DIAGNOSIS — E1136 Type 2 diabetes mellitus with diabetic cataract: Secondary | ICD-10-CM | POA: Insufficient documentation

## 2021-08-07 DIAGNOSIS — J45909 Unspecified asthma, uncomplicated: Secondary | ICD-10-CM | POA: Diagnosis not present

## 2021-08-07 DIAGNOSIS — I10 Essential (primary) hypertension: Secondary | ICD-10-CM | POA: Diagnosis not present

## 2021-08-07 DIAGNOSIS — E1143 Type 2 diabetes mellitus with diabetic autonomic (poly)neuropathy: Secondary | ICD-10-CM | POA: Insufficient documentation

## 2021-08-07 HISTORY — PX: CATARACT EXTRACTION W/PHACO: SHX586

## 2021-08-07 LAB — GLUCOSE, CAPILLARY
Glucose-Capillary: 75 mg/dL (ref 70–99)
Glucose-Capillary: 86 mg/dL (ref 70–99)

## 2021-08-07 SURGERY — PHACOEMULSIFICATION, CATARACT, WITH IOL INSERTION
Anesthesia: Monitor Anesthesia Care | Site: Eye | Laterality: Right

## 2021-08-07 MED ORDER — SIGHTPATH DOSE#1 NA CHONDROIT SULF-NA HYALURON 40-17 MG/ML IO SOLN
INTRAOCULAR | Status: DC | PRN
  Administered 2021-08-07: 1 mL via INTRAOCULAR

## 2021-08-07 MED ORDER — MOXIFLOXACIN HCL 0.5 % OP SOLN
OPHTHALMIC | Status: DC | PRN
  Administered 2021-08-07: 0.2 mL via OPHTHALMIC

## 2021-08-07 MED ORDER — SIGHTPATH DOSE#1 BSS IO SOLN
INTRAOCULAR | Status: DC | PRN
  Administered 2021-08-07: 15 mL

## 2021-08-07 MED ORDER — TETRACAINE HCL 0.5 % OP SOLN
1.0000 [drp] | OPHTHALMIC | Status: DC | PRN
  Administered 2021-08-07 (×3): 1 [drp] via OPHTHALMIC

## 2021-08-07 MED ORDER — MIDAZOLAM HCL 2 MG/2ML IJ SOLN
INTRAMUSCULAR | Status: DC | PRN
  Administered 2021-08-07: 1 mg via INTRAVENOUS

## 2021-08-07 MED ORDER — ARMC OPHTHALMIC DILATING DROPS
1.0000 "application " | OPHTHALMIC | Status: DC | PRN
  Administered 2021-08-07 (×3): 1 via OPHTHALMIC

## 2021-08-07 MED ORDER — BRIMONIDINE TARTRATE-TIMOLOL 0.2-0.5 % OP SOLN
OPHTHALMIC | Status: DC | PRN
  Administered 2021-08-07: 1 [drp] via OPHTHALMIC

## 2021-08-07 MED ORDER — FENTANYL CITRATE (PF) 100 MCG/2ML IJ SOLN
INTRAMUSCULAR | Status: DC | PRN
  Administered 2021-08-07: 50 ug via INTRAVENOUS

## 2021-08-07 MED ORDER — SIGHTPATH DOSE#1 BSS IO SOLN
INTRAOCULAR | Status: DC | PRN
  Administered 2021-08-07: 58 mL via OPHTHALMIC

## 2021-08-07 MED ORDER — LIDOCAINE HCL (PF) 2 % IJ SOLN
INTRAMUSCULAR | Status: DC | PRN
  Administered 2021-08-07: 1 mL via INTRAMUSCULAR

## 2021-08-07 SURGICAL SUPPLY — 10 items
CATARACT SUITE SIGHTPATH (MISCELLANEOUS) ×2 IMPLANT
FEE CATARACT SUITE SIGHTPATH (MISCELLANEOUS) ×1 IMPLANT
GLOVE SURG ENC TEXT LTX SZ8 (GLOVE) ×2 IMPLANT
GLOVE SURG TRIUMPH 8.0 PF LTX (GLOVE) ×2 IMPLANT
LENS IOL TECNIS EYHANCE 18.0 (Intraocular Lens) ×1 IMPLANT
NDL FILTER BLUNT 18X1 1/2 (NEEDLE) ×1 IMPLANT
NEEDLE FILTER BLUNT 18X 1/2SAF (NEEDLE) ×1
NEEDLE FILTER BLUNT 18X1 1/2 (NEEDLE) ×1 IMPLANT
SYR 3ML LL SCALE MARK (SYRINGE) ×2 IMPLANT
WATER STERILE IRR 250ML POUR (IV SOLUTION) ×2 IMPLANT

## 2021-08-07 NOTE — Anesthesia Preprocedure Evaluation (Signed)
Anesthesia Evaluation  Patient identified by MRN, date of birth, ID band Patient awake    Reviewed: Allergy & Precautions, NPO status , Patient's Chart, lab work & pertinent test results  Airway Mallampati: II  TM Distance: >3 FB Neck ROM: Full    Dental no notable dental hx.    Pulmonary asthma , sleep apnea ,    Pulmonary exam normal        Cardiovascular hypertension, Normal cardiovascular exam     Neuro/Psych PSYCHIATRIC DISORDERS Depression    GI/Hepatic NASH Diabetic gastroparesis   Endo/Other  diabetes, Type 2  Renal/GU Renal InsufficiencyRenal disease     Musculoskeletal  (+) Arthritis ,   Abdominal (+) + obese,   Peds  Hematology   Anesthesia Other Findings   Reproductive/Obstetrics                             Anesthesia Physical  Anesthesia Plan  ASA: 3  Anesthesia Plan: MAC   Post-op Pain Management: Minimal or no pain anticipated   Induction: Intravenous  PONV Risk Score and Plan: 2 and TIVA, Midazolam and Treatment may vary due to age or medical condition  Airway Management Planned: Natural Airway and Nasal Cannula  Additional Equipment: None  Intra-op Plan:   Post-operative Plan:   Informed Consent: I have reviewed the patients History and Physical, chart, labs and discussed the procedure including the risks, benefits and alternatives for the proposed anesthesia with the patient or authorized representative who has indicated his/her understanding and acceptance.     Dental advisory given  Plan Discussed with: CRNA  Anesthesia Plan Comments:         Anesthesia Quick Evaluation

## 2021-08-07 NOTE — Anesthesia Postprocedure Evaluation (Signed)
Anesthesia Post Note  Patient: Mary Cantrell  Procedure(s) Performed: CATARACT EXTRACTION PHACO AND INTRAOCULAR LENS PLACEMENT (IOC) RIGHT DIABETIC 7.29 00:48.4 (Right: Eye)     Patient location during evaluation: PACU Anesthesia Type: MAC Level of consciousness: awake and alert Pain management: pain level controlled Vital Signs Assessment: post-procedure vital signs reviewed and stable Respiratory status: spontaneous breathing and nonlabored ventilation Cardiovascular status: blood pressure returned to baseline Postop Assessment: no apparent nausea or vomiting Anesthetic complications: no   No notable events documented.  Winton Offord [employer]

## 2021-08-07 NOTE — Transfer of Care (Signed)
Immediate Anesthesia Transfer of Care Note  Patient: Mary Cantrell  Procedure(s) Performed: CATARACT EXTRACTION PHACO AND INTRAOCULAR LENS PLACEMENT (IOC) RIGHT DIABETIC 7.29 00:48.4 (Right: Eye)  Patient Location: PACU  Anesthesia Type: MAC  Level of Consciousness: awake, alert  and patient cooperative  Airway and Oxygen Therapy: Patient Spontanous Breathing and Patient connected to supplemental oxygen  Post-op Assessment: Post-op Vital signs reviewed, Patient's Cardiovascular Status Stable, Respiratory Function Stable, Patent Airway and No signs of Nausea or vomiting  Post-op Vital Signs: Reviewed and stable  Complications: No notable events documented.

## 2021-08-07 NOTE — Anesthesia Procedure Notes (Signed)
Procedure Name: MAC Date/Time: 08/07/2021 7:19 AM Performed by: Cameron Ali, CRNA Pre-anesthesia Checklist: Patient identified, Emergency Drugs available, Suction available, Timeout performed and Patient being monitored Patient Re-evaluated:Patient Re-evaluated prior to induction Oxygen Delivery Method: Nasal cannula Placement Confirmation: positive ETCO2

## 2021-08-07 NOTE — H&P (Signed)
[facility]   Primary Care Physician:  Steele Sizer, MD Ophthalmologist: Dr. George Ina  Pre-Procedure History & Physical: HPI:  Mary Cantrell is a 76 y.o. female here for cataract surgery.   Past Medical History:  Diagnosis Date   Achilles tendinitis of right lower extremity    Allergy    Asthma    Moderate   Chronic back pain    Depression    Diabetes mellitus without complication ([place])    Gastroparesis    DM   Hyperlipidemia    Hypertension    Hypoxemia    Irritable bowel syndrome    Maculopathy    NASH (nonalcoholic steatohepatitis)    Nuclear sclerosis of both eyes    OA (osteoarthritis)    Hips   OSA (obstructive sleep apnea)    CPAP   Post-void dribbling    Psoriasis    Regurgitation    SOB (shortness of breath)    Tear of left rotator cuff    TMJ (dislocation of temporomandibular joint)    Trochanteric bursitis of right hip    Wears hearing aid in both ears     Past Surgical History:  Procedure Laterality Date   ABDOMINAL HYSTERECTOMY     APPENDECTOMY     ARTERY BIOPSY N/A 02/23/2015   Procedure: BIOPSY TEMPORAL ARTERY;  Surgeon: Algernon Huxley, MD;  Location: [facility] ORS;  Service: Vascular;  Laterality: N/A;   BREAST BIOPSY Left    1990's   CATARACT EXTRACTION W/PHACO Left 07/24/2021   Procedure: CATARACT EXTRACTION PHACO AND INTRAOCULAR LENS PLACEMENT ([place]) LEFT DIABETIC 8.37 00:50.0;  Surgeon: Birder Robson, MD;  Location: [facility];  Service: Ophthalmology;  Laterality: Left;  Diabetic   EXCISION / CURETTAGE BONE CYST FINGER     Left Index Finger   PILONIDAL CYST EXCISION     SHOULDER ARTHROSCOPY Right [id number]   Dr. Mauri Pole   STOMACH SURGERY  1941   3 arteries leaking in stomach after appendectomy   TONSILLECTOMY      Prior to Admission medications   Medication Sig Start Date End Date Taking? Authorizing Provider  albuterol (VENTOLIN HFA) 108 (90 Base) MCG/ACT inhaler Inhale 2 puffs into the lungs every 4 (four) hours as needed  for wheezing (takes every am and then as needed.).    Yes [provider]  aspirin 81 MG tablet Take 81 mg by mouth daily.    Yes [provider]  budesonide-formoterol (SYMBICORT) 160-4.5 MCG/ACT inhaler Inhale 2 puffs into the lungs every morning. 08/17/14 08/07/21 Yes Erby Pian, MD  Calcium Carbonate-Vit D-Min ([employer] CALCIUM 600 + D/MINERALS) 600-400 MG-UNIT TABS Take 1 tablet by mouth daily. 12/07/15  Yes [provider]  cholecalciferol (VITAMIN D3) 25 MCG (1000 UNIT) tablet Take 1,000 Units by mouth daily.   Yes [provider]  Cinnamon 500 MG capsule Take 1,000 mg by mouth daily.    Yes [provider]  cyanocobalamin 100 MCG tablet Take 100 mcg by mouth 2 (two) times a week.    Yes [provider]  dicyclomine (BENTYL) 20 MG tablet TAKE 1 TABLET FOUR TIMES A DAY BEFORE MEALS AND AT BEDTIME 09/09/20  Yes Sowles, Drue Stager, MD  diphenhydrAMINE (BENADRYL) 25 MG tablet Take 50 mg by mouth at bedtime.   Yes [provider]  fluticasone (FLONASE) 50 MCG/ACT nasal spray USE 2 SPRAYS IN EACH NOSTRIL AT BEDTIME 09/18/19  Yes Sowles, Drue Stager, MD  folic acid (FOLVITE) 1 MG tablet Take 1 mg by mouth daily.  01/22/16  Yes [provider]  glipiZIDE (GLUCOTROL XL) 2.5 MG 24 hr tablet Take 1 tablet (2.5 mg total) by mouth every morning. 02/28/21  Yes Sowles, Drue Stager, MD  glipiZIDE (GLUCOTROL XL) 5 MG 24 hr tablet Take 1 tablet (5 mg total) by mouth every morning. 02/28/21  Yes Sowles, Drue Stager, MD  ipratropium (ATROVENT) 0.03 % nasal spray Place 2 sprays into both nostrils every 12 (twelve) hours. 01/28/20  Yes Sowles, Drue Stager, MD  metFORMIN (GLUCOPHAGE XR) 750 MG 24 hr tablet Take 2 tablets (1,500 mg total) by mouth daily with breakfast. 02/28/21  Yes Sowles, Drue Stager, MD  methotrexate (RHEUMATREX) 2.5 MG tablet Take 20 mg by mouth once a week. Caution:Chemotherapy. Protect from light.   Yes Valinda Party, MD  montelukast (SINGULAIR) 10 MG  tablet Take 10 mg by mouth every evening.   Yes [provider]  Multiple Vitamins-Minerals (MULTIVITAMIN ADULTS PO) Take 1 tablet by mouth daily.    Yes [provider]  olmesartan (BENICAR) 20 MG tablet TAKE 1 TABLET DAILY 03/05/21  Yes Sowles, Drue Stager, MD  pravastatin (PRAVACHOL) 40 MG tablet TAKE 1 TABLET DAILY  (AWAITING LABS OF NEXT VISIT FOR FURTHER REFILLS) 05/03/21  Yes Sowles, Drue Stager, MD  predniSONE (DELTASONE) 2.5 MG tablet Take 2.5 mg by mouth daily. 04/21/20  Yes Valinda Party, MD  predniSONE (DELTASONE) 5 MG tablet Take 5 mg by mouth daily. 11/02/19  Yes Valinda Party, MD  Propylene Glycol (SYSTANE BALANCE OP) Apply to eye. Only as needed   Yes   spironolactone-hydrochlorothiazide (ALDACTAZIDE) 25-25 MG tablet TAKE ONE-HALF (1/2) TABLET DAILY 11/27/20  Yes Sowles, Drue Stager, MD  vitamin C (ASCORBIC ACID) 500 MG tablet Take 1 tablet by mouth at bedtime.    Yes [provider]  Vitamin E 268 MG (400 UNIT) TABS Take by mouth daily.   Yes [provider]  Cysteamine Bitartrate (PROCYSBI) 300 MG PACK Use 1 each 3 (three) times daily Use as instructed. Patient not taking: Reported on 07/11/2021 10/26/18   [provider]  glucose blood (ONE TOUCH ULTRA TEST) test strip 1 each by Other route daily. Check fsbs once a day  E11.69, E66.9 07/19/21   Steele Sizer, MD    Allergies as of 06/27/2021 - Review Complete 02/28/2021  Allergen Reaction Noted   Doxycycline Other (See Comments) 03/12/2018   Codeine  12/12/2014   Lantus  [insulin glargine]  12/21/2014   Penicillin g  04/03/2016   Rosuvastatin  05/31/2020   Sitagliptin  12/21/2014   Sulfa antibiotics  12/12/2014   Wound dressing adhesive Rash 02/24/2020    Family History  Problem Relation Age of Onset   Rheum arthritis Father    Diabetes Father    Pneumonia Father    Pneumonia Mother    Diabetes Sister    Osteoarthritis Sister    Breast cancer Neg Hx     Social History    Socioeconomic History   Marital status: Married    Spouse name: Not on file   Number of children: 2   Years of education: Not on file   Highest education level: Not on file  Occupational History   Occupation: retired  Tobacco Use   Smoking status: Never   Smokeless tobacco: Never  Vaping Use   Vaping Use: Never used  Substance and Sexual Activity   Alcohol use: No    Alcohol/week: 0.0 standard drinks   Drug use: No   Sexual activity: Yes    Partners: Male  Comment: hysterectomy  Other Topics Concern   Not on file  Social History Narrative   Not on file   Social Determinants of Health   Financial Resource Strain: Not on file  Food Insecurity: Not on file  Transportation Needs: Not on file  Physical Activity: Not on file  Stress: Not on file  Social Connections: Not on file  Intimate Partner Violence: Not on file    Review of Systems: See HPI, otherwise negative ROS  Physical Exam: BP 126/79    Pulse 86    Temp 97.7 F (36.5 C) (Temporal)    Resp 20    Ht 5' 6"  (1.676 m)    Wt 84.4 kg    SpO2 97%    BMI 30.02 kg/m  General:   Alert, cooperative in NAD Head:  Normocephalic and atraumatic. Respiratory:  Normal work of breathing. Cardiovascular:  RRR  Impression/Plan: Mary Cantrell is here for cataract surgery.  Risks, benefits, limitations, and alternatives regarding cataract surgery have been reviewed with the patient.  Questions have been answered.  All parties agreeable.   Birder Robson, MD  08/07/2021, 7:04 AM

## 2021-08-07 NOTE — Op Note (Signed)
PREOPERATIVE DIAGNOSIS:  Nuclear sclerotic cataract of the right eye.   POSTOPERATIVE DIAGNOSIS:  H25.11 Cataract   OPERATIVE PROCEDURE:ORPROCALL@   SURGEON:  Birder Robson, MD.   ANESTHESIA:  Anesthesiologist: Ardeth Sportsman, MD CRNA: Cameron Ali, CRNA  1.      Managed anesthesia care. 2.      0.52m of Shugarcaine was instilled in the eye following the paracentesis.   COMPLICATIONS:  None.   TECHNIQUE:   Stop and chop   DESCRIPTION OF PROCEDURE:  The patient was examined and consented in the preoperative holding area where the aforementioned topical anesthesia was applied to the right eye and then brought back to the Operating Room where the right eye was prepped and draped in the usual sterile ophthalmic fashion and a lid speculum was placed. A paracentesis was created with the side port blade and the anterior chamber was filled with viscoelastic. A near clear corneal incision was performed with the steel keratome. A continuous curvilinear capsulorrhexis was performed with a cystotome followed by the capsulorrhexis forceps. Hydrodissection and hydrodelineation were carried out with BSS on a blunt cannula. The lens was removed in a stop and chop  technique and the remaining cortical material was removed with the irrigation-aspiration handpiece. The capsular bag was inflated with viscoelastic and the Technis [id number]  lens was placed in the capsular bag without complication. The remaining viscoelastic was removed from the eye with the irrigation-aspiration handpiece. The wounds were hydrated. The anterior chamber was flushed with BSS and the eye was inflated to physiologic pressure. 0.171mof Vigamox was placed in the anterior chamber. The wounds were found to be water tight. The eye was dressed with Combigan. The patient was given protective glasses to wear throughout the day and a shield with which to sleep tonight. The patient was also given drops with which to begin a drop regimen today and  will follow-up with me in one day. Implant Name Type Inv. Item Serial No. Manufacturer Lot No. LRB No. Used Action  LENS IOL TECNIS EYHANCE 18.0 - [id number] Lens LENS IOL TECNIS EYHANCE 18.0 [id number]  Right 1 Implanted   Procedure(s) with comments: CATARACT EXTRACTION PHACO AND INTRAOCULAR LENS PLACEMENT (IOC) RIGHT DIABETIC 7.29 00:48.4 (Right) - Diabetic  Electronically signed: WiBirder Robson/14/2023 7:33 AM

## 2021-08-08 ENCOUNTER — Encounter: Payer: Self-pay | Admitting: Ophthalmology

## 2021-08-13 ENCOUNTER — Encounter: Payer: Self-pay | Admitting: Family Medicine

## 2021-08-13 ENCOUNTER — Other Ambulatory Visit: Payer: Self-pay | Admitting: Family Medicine

## 2021-08-13 DIAGNOSIS — E1169 Type 2 diabetes mellitus with other specified complication: Secondary | ICD-10-CM

## 2021-08-21 ENCOUNTER — Other Ambulatory Visit: Payer: Self-pay

## 2021-08-21 ENCOUNTER — Encounter: Payer: Self-pay | Admitting: Podiatry

## 2021-08-21 ENCOUNTER — Ambulatory Visit (INDEPENDENT_AMBULATORY_CARE_PROVIDER_SITE_OTHER): Payer: Medicare Other | Admitting: Podiatry

## 2021-08-21 DIAGNOSIS — B351 Tinea unguium: Secondary | ICD-10-CM | POA: Diagnosis not present

## 2021-08-21 DIAGNOSIS — M79674 Pain in right toe(s): Secondary | ICD-10-CM | POA: Diagnosis not present

## 2021-08-21 NOTE — Progress Notes (Signed)
° °  SUBJECTIVE Patient presents to office today complaining of elongated, thickened toenail to the right hallux nail plate this been going on for several years however over the past week she has noticed some drainage and increased tenderness to the toenail.  She has been soaking her foot in Epsom salt water, applying antifungal spray, and tea tree oil and she says that over the past week there is been significant improvement.  She presents for further treatment and evaluation  Past Medical History:  Diagnosis Date   Achilles tendinitis of right lower extremity    Allergy    Asthma    Moderate   Chronic back pain    Depression    Diabetes mellitus without complication ([place])    Gastroparesis    DM   Hyperlipidemia    Hypertension    Hypoxemia    Irritable bowel syndrome    Maculopathy    NASH (nonalcoholic steatohepatitis)    Nuclear sclerosis of both eyes    OA (osteoarthritis)    Hips   OSA (obstructive sleep apnea)    CPAP   Post-void dribbling    Psoriasis    Regurgitation    SOB (shortness of breath)    Tear of left rotator cuff    TMJ (dislocation of temporomandibular joint)    Trochanteric bursitis of right hip    Wears hearing aid in both ears     OBJECTIVE General Patient is awake, alert, and oriented x 3 and in no acute distress. Derm Skin is dry and supple bilateral. Negative open lesions or macerations. Remaining integument unremarkable.  Hyperkeratotic elongated dystrophic nail with subungual debris noted to the right hallux nail plate.  No drainage.  No malodor. Vasc  DP and PT pedal pulses palpable bilaterally. Temperature gradient within normal limits.  Neuro Epicritic and protective threshold sensation grossly intact bilaterally.  Musculoskeletal Exam No symptomatic pedal deformities noted bilateral. Muscular strength within normal limits.  ASSESSMENT 1.  Pain due to onychomycosis of of toenail right hallux  PLAN OF CARE 1. Patient evaluated today.  2.  Instructed to maintain good pedal hygiene and foot care.  3. Mechanical debridement of nails 1-5 bilaterally performed using a nail nipper as a courtesy for the patient. Filed with dremel without incident.  4.  Continue the tea tree oil and topical Lamisil as needed  5.  Return to clinic in 3 mos. for routine foot care   Edrick Kins, DPM [facility]  Dr. Edrick Kins, DPM    [address], [place] [address]                Office [phone]  Fax [phone]

## 2021-08-22 ENCOUNTER — Encounter: Payer: Self-pay | Admitting: Family Medicine

## 2021-08-22 ENCOUNTER — Ambulatory Visit (INDEPENDENT_AMBULATORY_CARE_PROVIDER_SITE_OTHER): Payer: Medicare Other | Admitting: Family Medicine

## 2021-08-22 VITALS — BP 144/72 | HR 88 | Temp 98.1°F | Resp 16 | Ht 66.0 in | Wt 187.2 lb

## 2021-08-22 DIAGNOSIS — E1169 Type 2 diabetes mellitus with other specified complication: Secondary | ICD-10-CM | POA: Diagnosis not present

## 2021-08-22 DIAGNOSIS — E669 Obesity, unspecified: Secondary | ICD-10-CM | POA: Insufficient documentation

## 2021-08-22 DIAGNOSIS — E785 Hyperlipidemia, unspecified: Secondary | ICD-10-CM

## 2021-08-22 DIAGNOSIS — I10 Essential (primary) hypertension: Secondary | ICD-10-CM | POA: Insufficient documentation

## 2021-08-22 DIAGNOSIS — M5136 Other intervertebral disc degeneration, lumbar region: Secondary | ICD-10-CM

## 2021-08-22 DIAGNOSIS — G4733 Obstructive sleep apnea (adult) (pediatric): Secondary | ICD-10-CM

## 2021-08-22 DIAGNOSIS — R269 Unspecified abnormalities of gait and mobility: Secondary | ICD-10-CM

## 2021-08-22 DIAGNOSIS — N1831 Chronic kidney disease, stage 3a: Secondary | ICD-10-CM | POA: Diagnosis not present

## 2021-08-22 DIAGNOSIS — M19071 Primary osteoarthritis, right ankle and foot: Secondary | ICD-10-CM

## 2021-08-22 DIAGNOSIS — F341 Dysthymic disorder: Secondary | ICD-10-CM

## 2021-08-22 DIAGNOSIS — Z7952 Long term (current) use of systemic steroids: Secondary | ICD-10-CM | POA: Insufficient documentation

## 2021-08-22 DIAGNOSIS — N183 Chronic kidney disease, stage 3 unspecified: Secondary | ICD-10-CM

## 2021-08-22 DIAGNOSIS — I129 Hypertensive chronic kidney disease with stage 1 through stage 4 chronic kidney disease, or unspecified chronic kidney disease: Secondary | ICD-10-CM

## 2021-08-22 DIAGNOSIS — M47816 Spondylosis without myelopathy or radiculopathy, lumbar region: Secondary | ICD-10-CM | POA: Insufficient documentation

## 2021-08-22 DIAGNOSIS — M1990 Unspecified osteoarthritis, unspecified site: Secondary | ICD-10-CM | POA: Insufficient documentation

## 2021-08-22 DIAGNOSIS — L4050 Arthropathic psoriasis, unspecified: Secondary | ICD-10-CM

## 2021-08-22 DIAGNOSIS — I739 Peripheral vascular disease, unspecified: Secondary | ICD-10-CM

## 2021-08-22 DIAGNOSIS — E119 Type 2 diabetes mellitus without complications: Secondary | ICD-10-CM | POA: Insufficient documentation

## 2021-08-22 MED ORDER — METFORMIN HCL ER 750 MG PO TB24: ORAL_TABLET | ORAL | 1 refills | Status: DC

## 2021-08-22 MED ORDER — OLMESARTAN MEDOXOMIL 20 MG PO TABS: 20.0000 mg | ORAL_TABLET | Freq: Every day | ORAL | 1 refills | Status: DC

## 2021-08-22 MED ORDER — GLIPIZIDE ER 2.5 MG PO TB24: 2.5000 mg | ORAL_TABLET | Freq: Every morning | ORAL | 1 refills | Status: DC

## 2021-08-22 MED ORDER — GLIPIZIDE ER 5 MG PO TB24: 5.0000 mg | ORAL_TABLET | Freq: Every morning | ORAL | 1 refills | Status: DC

## 2021-08-22 NOTE — Progress Notes (Signed)
Name: Mary Cantrell   MRN: [id number]    DOB: 1946/01/27   Date:08/22/2021       Progress Note  Subjective  Chief Complaint  Chief Complaint  Patient presents with   Follow-up    HPI  DMII: last A1C was 7.1% it went to 7.8%,7.5 % it was down to 6.9 % but today it was up to 7.6 %  She denies polyphagia, polydipsia or polyuria, she is compliant with medication. Taking Metformin 3 tablets every morning, but rx says two daily, also glipizide 7.5 mg- she did not bring her bottles with her. She denies hypoglycemic episodes. She has associated obesity, dyslipidemia, chronic kidney disease, last urine micro was normal. LDL not at goal but unable to tolerate other statins, discussed zetia but she wants to think about it, since she takes so many medications   OSA: on CPAP : wears it at 99 % of the time - goes to bed wearing it but sometimes removes during the night,  she denies day time somnolence or headaches. Unchanged    Hyperlipidemia: reviewed labs done by Endo 04/19/2019 showed LDL 87, HDL 60.4. She was on Pravastatin , LDL goal is below 70. We tried to adjust dose but she could not tolerate the higher dose, we switched to Crestor but unable to tolerate it, she is back on Pravastatin 40 mg and is compliant but last LDL was up 94 , she does not want to change medication at this time  Gout: diagnosed by Dr. Dossie Der and is doing okay at this time  Mild Intermittent Asthma: under the care of Dr. Raul Del , she is on Symbicort and albuterol prn, she has stable SOB not as significant since not as active due to hip pain.    Hypertension with CKI stage III: she is on ARB and Aldactazide half pill daily. No chest pain or palpitation. Today her medications were all duplicated and she had losartan and micardis on the list also. She said she was taking half pill of losartan 50 mg and half of Olmasartan 20 daily, advised to stop Losartan and take one full pill of Olmasartan , also advised her to bring all her  medications to every office visit    Psoriasis: on her elbows, uses topical medication prn, no lesions at this time.   PMR: still under the care of Rheumatologist, Dr. Dossie Der  She also has OA also psoriatic arthritis and is on methotrexate , she states unable to go down on the dose because pain increases, she also takes prednisone 7.5 mg daily to control symptoms  She continues to have left hip pain    Tremors  she is under the care of Dr. Melrose Nakayama, she has tremors, balanced problems, sensory ataxia  she stopped taking Gabapentin because it caused fatigue. She states tremors are stable, uses a cane to help with gait. Unchanged   Claudication: she states no symptoms since not as active lately due to right hip pain. Discussed activity to help with circulation, maybe a stationary bike  Patient Active Problem List   Diagnosis Date Noted   Hypertension 08/22/2021   Long term (current) use of systemic steroids 08/22/2021   Lumbar spondylosis 08/22/2021   Diabetes mellitus without complication ([place]) 83/25/4982   Osteoarthritis 08/22/2021   DDD (degenerative disc disease), lumbar 10/24/2020   Neuropathy 02/27/2020   Sensory ataxia 02/27/2020   Tremor 02/27/2020   Chronic kidney disease, stage III (moderate) ([place]) 12/09/2018   Dyslipidemia associated with type 2 diabetes mellitus ([place])  12/09/2018   Obesity (BMI 30.0-34.9) 07/17/2018   Hypertension associated with stage 2 chronic kidney disease due to type 2 diabetes mellitus ([place]) 03/15/2018   Psoriatic arthritis ([place]) 12/04/2017   Stenosis of left carotid artery 12/16/2016   Polymyalgia rheumatica ([place]) 04/13/2015   Benign hypertension with chronic kidney disease, stage III ([place]) 12/21/2014   Back pain, chronic 12/21/2014   Narrowing of intervertebral disc space 12/21/2014   Diabetic gastroparesis ([place]) 12/21/2014   Amaurosis fugax of left eye 12/21/2014   H/O: hypothyroidism 12/21/2014   Hyperlipidemia 12/21/2014   IBS (irritable bowel  syndrome) 12/21/2014   Disorder of macula of retina 12/21/2014   Asthma, moderate 12/21/2014   NASH (nonalcoholic steatohepatitis) 12/21/2014   NS (nuclear sclerosis) 12/21/2014   Osteoarthrosis 12/21/2014   Psoriasis 12/21/2014   Knee torn cartilage 12/21/2014   Diabetes mellitus type 2 in obese ([place]) 12/21/2014   Obstructive apnea 01/03/2014   History of shoulder surgery 04/23/2011    Past Surgical History:  Procedure Laterality Date   ABDOMINAL HYSTERECTOMY     APPENDECTOMY     ARTERY BIOPSY N/A 02/23/2015   Procedure: BIOPSY TEMPORAL ARTERY;  Surgeon: Algernon Huxley, MD;  Location: [facility];  Service: Vascular;  Laterality: N/A;   BREAST BIOPSY Left    1990's   CATARACT EXTRACTION W/PHACO Left 07/24/2021   Procedure: CATARACT EXTRACTION PHACO AND INTRAOCULAR LENS PLACEMENT ([place]) LEFT DIABETIC 8.37 00:50.0;  Surgeon: Birder Robson, MD;  Location: [facility];  Service: Ophthalmology;  Laterality: Left;  Diabetic   CATARACT EXTRACTION W/PHACO Right 08/07/2021   Procedure: CATARACT EXTRACTION PHACO AND INTRAOCULAR LENS PLACEMENT ([facility]) RIGHT DIABETIC 7.29 00:48.4;  Surgeon: Birder Robson, MD;  Location: [facility];  Service: Ophthalmology;  Laterality: Right;  Diabetic   EXCISION / CURETTAGE BONE CYST FINGER     Left Index Finger   PILONIDAL CYST EXCISION     SHOULDER ARTHROSCOPY Right [id number]   Dr. Mauri Pole   STOMACH SURGERY  1941   3 arteries leaking in stomach after appendectomy   TONSILLECTOMY      Family History  Problem Relation Age of Onset   Rheum arthritis Father    Diabetes Father    Pneumonia Father    Pneumonia Mother    Diabetes Sister    Osteoarthritis Sister    Breast cancer Neg Hx     Social History   Tobacco Use   Smoking status: Never   Smokeless tobacco: Never  Substance Use Topics   Alcohol use: No    Alcohol/week: 0.0 standard drinks     Current Outpatient Medications:    albuterol (VENTOLIN HFA) 108 (90 Base) MCG/ACT  inhaler, 2 puffs as needed, Disp: , Rfl:    aspirin 81 MG EC tablet, Take 1 tablet by mouth in the morning, at noon, and at bedtime., Disp: , Rfl:    calcipotriene-betamethasone (TACLONEX) ointment, Apply 1 application topically daily., Disp: , Rfl:    Calcium Carbonate-Vit D-Min ([facility] CALCIUM 600 + D/MINERALS) 600-400 MG-UNIT TABS, Take 1 tablet by mouth daily., Disp: , Rfl: 1   cholecalciferol (VITAMIN D3) 25 MCG (1000 UNIT) tablet, Take 1,000 Units by mouth daily., Disp: , Rfl:    Cinnamon 500 MG capsule, Take 1,000 mg by mouth daily. , Disp: , Rfl:    cyanocobalamin 100 MCG tablet, Take 100 mcg by mouth 2 (two) times a week. , Disp: , Rfl:    dicyclomine (BENTYL) 20 MG tablet, TAKE 1 TABLET FOUR TIMES A DAY BEFORE MEALS AND  AT BEDTIME, Disp: 360 tablet, Rfl: 3   fluticasone (FLONASE) 50 MCG/ACT nasal spray, USE 2 SPRAYS IN EACH NOSTRIL AT BEDTIME, Disp: 48 g, Rfl: 3   folic acid (FOLVITE) 1 MG tablet, Take 1 mg by mouth daily. , Disp: , Rfl:    gabapentin (NEURONTIN) 100 MG capsule, 2 capsules, Disp: , Rfl:    glucose blood (ONE TOUCH ULTRA TEST) test strip, 1 each by Other route daily. Check fsbs once a day  E11.69, E66.9, Disp: 100 each, Rfl: 2   ipratropium (ATROVENT) 0.03 % nasal spray, Place 2 sprays into both nostrils every 12 (twelve) hours., Disp: 90 mL, Rfl: 1   methotrexate (RHEUMATREX) 2.5 MG tablet, Take 20 mg by mouth once a week. Caution:Chemotherapy. Protect from light., Disp: , Rfl:    montelukast (SINGULAIR) 10 MG tablet, Take 1 tablet by mouth at bedtime., Disp: , Rfl:    Multiple Vitamins-Minerals (MULTIVITAMIN ADULTS PO), Take 1 tablet by mouth daily. , Disp: , Rfl:    pravastatin (PRAVACHOL) 40 MG tablet, TAKE 1 TABLET DAILY  (AWAITING LABS OF NEXT VISIT FOR FURTHER REFILLS), Disp: 90 tablet, Rfl: 3   predniSONE (DELTASONE) 2.5 MG tablet, Take 2.5 mg by mouth daily., Disp: , Rfl:    predniSONE (DELTASONE) 5 MG tablet, Take 5 mg by mouth daily., Disp: , Rfl:     spironolactone-hydrochlorothiazide (ALDACTAZIDE) 25-25 MG tablet, TAKE ONE-HALF (1/2) TABLET DAILY, Disp: 45 tablet, Rfl: 2   vitamin C (ASCORBIC ACID) 500 MG tablet, Take 1 tablet by mouth at bedtime. , Disp: , Rfl:    budesonide-formoterol (SYMBICORT) 160-4.5 MCG/ACT inhaler, Inhale 2 puffs into the lungs every morning., Disp: , Rfl:    glipiZIDE (GLUCOTROL XL) 2.5 MG 24 hr tablet, Take 1 tablet (2.5 mg total) by mouth every morning., Disp: 90 tablet, Rfl: 1   glipiZIDE (GLUCOTROL XL) 5 MG 24 hr tablet, Take 1 tablet (5 mg total) by mouth every morning., Disp: 90 tablet, Rfl: 1   metFORMIN (GLUCOPHAGE-XR) 750 MG 24 hr tablet, TAKE 2 TABLETS (1,500 MG TOTAL) DAILY WITH BREAKFAST, Disp: 180 tablet, Rfl: 1   olmesartan (BENICAR) 20 MG tablet, Take 1 tablet (20 mg total) by mouth daily. In place of losartan, Disp: 90 tablet, Rfl: 1   Propylene Glycol (SYSTANE BALANCE OP), Apply to eye. Only as needed (Patient not taking: Reported on 08/22/2021), Disp: , Rfl:   Allergies  Allergen Reactions   Doxycycline Other (See Comments)    Weakness, flu like symptoms   Codeine     Dizzy   Lantus  [Insulin Glargine]     made skin hot   Oxycodone     Dizziness   Rosuvastatin     Myalgia    Penicillin G Rash    And dizziness   Sitagliptin Rash   Sulfa Antibiotics Rash    Other reaction(s):   Red burning skin   Wound Dressing Adhesive Rash    Surgical tape adhesive    I personally reviewed active problem list, medication list, allergies, family history, social history with the patient/caregiver today.   ROS  Constitutional: Negative for fever or weight change.  Respiratory: Negative for cough and shortness of breath.   Cardiovascular: Negative for chest pain or palpitations.  Gastrointestinal: Negative for abdominal pain, no bowel changes.  Musculoskeletal: Negative for gait problem or joint swelling.  Skin: Negative for rash.  Neurological: Negative for dizziness or headache.  No other  specific complaints in a complete review of systems (except as listed  in HPI above).   Objective  Vitals:   08/22/21 1431  BP: (!) 144/72  Pulse: 88  Resp: 16  Temp: 98.1 F (36.7 C)  TempSrc: Oral  SpO2: 96%  Weight: 187 lb 3.2 oz (84.9 kg)  Height: 5' 6"  (1.676 m)    Body mass index is 30.21 kg/m.  Physical Exam  Constitutional: Patient appears well-developed and well-nourished. Obese  No distress.  HEENT: head atraumatic, normocephalic, pupils equal and reactive to light,  neck supple, Cardiovascular: Normal rate, regular rhythm and normal heart sounds.  No murmur heard. No BLE edema. Pulmonary/Chest: Effort normal and breath sounds normal. No respiratory distress. Abdominal: Soft.  There is no tenderness. Psychiatric: Patient has a normal mood and affect. behavior is normal. Judgment and thought content normal.   Recent Results (from the past 2160 hour(s))  HM DIABETES EYE EXAM     Status: None   Collection Time: 06/26/21 12:00 AM  Result Value Ref Range   HM Diabetic Eye Exam No Retinopathy No Retinopathy    Comment: [place]  CBC and differential     Status: None   Collection Time: 07/18/21 12:00 AM  Result Value Ref Range   Hemoglobin 13.7 12.0 - 16.0   HCT 41 36 - 46   Neutrophils Absolute 11.80    WBC 13.8   CBC     Status: None   Collection Time: 07/18/21 12:00 AM  Result Value Ref Range   RBC 4.35 3.87 - 6.27  Basic metabolic panel     Status: Abnormal   Collection Time: 07/18/21 12:00 AM  Result Value Ref Range   Glucose 146    BUN 18 4 - 21   CO2 30 (A) 13 - 22   Creatinine 0.9 0.5 - 1.1   Potassium 3.6 3.4 - 5.3   Sodium 139 137 - 147   Chloride 100 99 - 108  Comprehensive metabolic panel     Status: None   Collection Time: 07/18/21 12:00 AM  Result Value Ref Range   Globulin 2.5    eGFR 56    Calcium 9.5 8.7 - 10.7   Albumin 4.1 3.5 - 5.0  Hepatic function panel     Status: None   Collection Time: 07/18/21 12:00 AM  Result  Value Ref Range   Alkaline Phosphatase 80 25 - 125   ALT 19 7 - 35   AST 19 13 - 35   Bilirubin, Total 0.3   POCT erythrocyte sed rate, Non-automated     Status: None   Collection Time: 07/19/21 12:00 AM  Result Value Ref Range   Sed Rate 8   Glucose, capillary     Status: None   Collection Time: 07/24/21  6:42 AM  Result Value Ref Range   Glucose-Capillary 93 70 - 99 mg/dL    Comment: Glucose reference range applies only to samples taken after fasting for at least 8 hours.  Glucose, capillary     Status: None   Collection Time: 07/24/21  7:52 AM  Result Value Ref Range   Glucose-Capillary 85 70 - 99 mg/dL    Comment: Glucose reference range applies only to samples taken after fasting for at least 8 hours.  Glucose, capillary     Status: None   Collection Time: 08/07/21  6:31 AM  Result Value Ref Range   Glucose-Capillary 75 70 - 99 mg/dL    Comment: Glucose reference range applies only to samples taken after fasting for at least 8 hours.  Glucose, capillary     Status: None   Collection Time: 08/07/21  7:36 AM  Result Value Ref Range   Glucose-Capillary 86 70 - 99 mg/dL    Comment: Glucose reference range applies only to samples taken after fasting for at least 8 hours.    Diabetic Foot Exam: Diabetic Foot Exam - Simple   Simple Foot Form Visual Inspection See comments: Yes Sensation Testing Intact to touch and monofilament testing bilaterally: Yes Pulse Check Posterior Tibialis and Dorsalis pulse intact bilaterally: Yes Comments Dressing on right first toe, procedure done by Dr. Amalia Hailey      PHQ2/9: Depression screen [facility] 2/9 08/22/2021 02/28/2021 10/24/2020 05/31/2020 01/28/2020  Decreased Interest 0 1 0 0 0  Down, Depressed, Hopeless 0 1 0 0 0  PHQ - 2 Score 0 2 0 0 0  Altered sleeping 0 0 - - 0  Tired, decreased energy 0 0 - - 0  Change in appetite 0 3 - - 0  Feeling bad or failure about yourself  0 0 - - 0  Trouble concentrating 0 0 - - 0  Moving slowly or  fidgety/restless 0 0 - - 0  Suicidal thoughts 0 0 - - 0  PHQ-9 Score 0 5 - - 0  Difficult doing work/chores Not difficult at all - - - -  Some recent data might be hidden    phq 9 is negative   Fall Risk: Fall Risk  08/22/2021 02/28/2021 10/24/2020 05/31/2020 01/28/2020  Falls in the past year? 1 0 0 0 0  Comment - - - - -  Number falls in past yr: 0 0 0 0 0  Injury with Fall? 1 0 0 0 0  Comment - - - - -  Risk for fall due to : Impaired balance/gait Impaired balance/gait - - -  Follow up Falls prevention discussed Falls prevention discussed - - -     Functional Status Survey: Is the patient deaf or have difficulty hearing?: No Does the patient have difficulty seeing, even when wearing glasses/contacts?: No Does the patient have difficulty concentrating, remembering, or making decisions?: No Does the patient have difficulty walking or climbing stairs?: Yes Does the patient have difficulty dressing or bathing?: No Does the patient have difficulty doing errands alone such as visiting a doctor's office or shopping?: No    Assessment & Plan   1. Claudication [facility])  Advised to find an exercise that does not cause hip pain  2. Stage 3a chronic kidney disease ([place])   3. Benign hypertension with chronic kidney disease, stage III ([place])   4. Dyslipidemia associated with type 2 diabetes mellitus ([facility])  - HM Diabetes Foot Exam - glipiZIDE (GLUCOTROL XL) 2.5 MG 24 hr tablet; Take 1 tablet (2.5 mg total) by mouth every morning.  Dispense: 90 tablet; Refill: 1 - glipiZIDE (GLUCOTROL XL) 5 MG 24 hr tablet; Take 1 tablet (5 mg total) by mouth every morning.  Dispense: 90 tablet; Refill: 1 - metFORMIN (GLUCOPHAGE-XR) 750 MG 24 hr tablet; TAKE 2 TABLETS (1,500 MG TOTAL) DAILY WITH BREAKFAST  Dispense: 180 tablet; Refill: 1 - Hemoglobin A1c  5. DDD (degenerative disc disease), lumbar   6. Psoriatic arthritis ([place])   7. Gait difficulty   8. Obstructive apnea   9. Dysthymia   10.  Primary osteoarthritis of right foot   11. Hypertension, benign  - olmesartan (BENICAR) 20 MG tablet; Take 1 tablet (20 mg total) by mouth daily. In place of losartan  Dispense: 90 tablet;  Refill: 1

## 2021-08-23 LAB — HEMOGLOBIN A1C
Hgb A1c MFr Bld: 7.1 % of total Hgb — ABNORMAL HIGH (ref <5.7)
Mean Plasma Glucose: 157 mg/dL
eAG (mmol/L): 8.7 mmol/L

## 2021-08-27 ENCOUNTER — Other Ambulatory Visit: Payer: Self-pay | Admitting: Family Medicine

## 2021-08-27 ENCOUNTER — Encounter: Payer: Self-pay | Admitting: Family Medicine

## 2021-08-27 DIAGNOSIS — I10 Essential (primary) hypertension: Secondary | ICD-10-CM

## 2021-08-27 DIAGNOSIS — E1169 Type 2 diabetes mellitus with other specified complication: Secondary | ICD-10-CM

## 2021-08-27 MED ORDER — GLIPIZIDE ER 5 MG PO TB24: 5.0000 mg | ORAL_TABLET | Freq: Every morning | ORAL | 1 refills | Status: DC

## 2021-08-27 MED ORDER — GLIPIZIDE ER 2.5 MG PO TB24: 2.5000 mg | ORAL_TABLET | Freq: Every morning | ORAL | 1 refills | Status: DC

## 2021-08-27 MED ORDER — METFORMIN HCL ER 750 MG PO TB24: ORAL_TABLET | ORAL | 1 refills | Status: DC

## 2021-08-27 MED ORDER — OLMESARTAN MEDOXOMIL 20 MG PO TABS: 20.0000 mg | ORAL_TABLET | Freq: Every day | ORAL | 1 refills | Status: DC

## 2021-08-27 NOTE — Telephone Encounter (Signed)
Pt was calling into touch base with Dr Ancil Boozer as she said that Dr Ancil Boozer had [facility] and that was wrong and [facility] and [facility] everything she orderef. She is very upset as she said only urgent emergency drugs are to [facility] to [facility]. She wants a FU call with new scripts for everything to go to [facility]. She is very unhappy. Wants a FU call re what we are doing over here. [phone] Told pt we will fu with a call with a nurse to make sure we get everything correct/  ?

## 2021-08-27 NOTE — Addendum Note (Signed)
Addended by: Carlene Coria on: 08/27/2021 02:42 PM ? ? Modules accepted: Orders ? ?

## 2021-10-26 ENCOUNTER — Other Ambulatory Visit: Payer: Self-pay | Admitting: Family Medicine

## 2021-10-26 DIAGNOSIS — K580 Irritable bowel syndrome with diarrhea: Secondary | ICD-10-CM

## 2021-11-07 ENCOUNTER — Telehealth: Payer: Self-pay | Admitting: Family Medicine

## 2021-11-07 NOTE — Telephone Encounter (Signed)
Patient called trying to change her appointment from 7/19 to an earlier date. [place] end of [place]. She needs a medication refill on all her medication until she is able to find a new provider. She would like a call back please.  ?

## 2021-11-07 NOTE — Telephone Encounter (Signed)
Returned pt call to get her appt rescheduled. If Dr Ancil Boozer does not have anything available it is okay for her to see someone else

## 2021-11-14 ENCOUNTER — Other Ambulatory Visit: Payer: Self-pay | Admitting: Family Medicine

## 2021-11-14 DIAGNOSIS — E669 Obesity, unspecified: Secondary | ICD-10-CM

## 2021-11-14 NOTE — Telephone Encounter (Signed)
Medication Refill - Medication:  glucose blood (ONE TOUCH ULTRA TEST) test strip   Has the patient contacted their pharmacy? Yes.   Contact PCP  Preferred Pharmacy (with phone number or street name):  [place], [place] Phone:  [phone]  Fax:  [phone]      Has the patient been seen for an appointment in the last year OR does the patient have an upcoming appointment? Yes.    Agent: Please be advised that RX refills may take up to 3 business days. We ask that you follow-up with your pharmacy.

## 2021-11-15 MED ORDER — ONETOUCH ULTRA BLUE VI STRP: 1.0000 | ORAL_STRIP | Freq: Every day | 2 refills | Status: DC

## 2021-11-15 NOTE — Telephone Encounter (Signed)
Requested Prescriptions  Pending Prescriptions Disp Refills  . glucose blood (ONE TOUCH ULTRA TEST) test strip 100 each 2    Sig: 1 each by Other route daily. Check fsbs once a day  E11.69, E66.9     Endocrinology: Diabetes - Testing Supplies Passed - 11/15/2021  2:57 PM      Passed - Valid encounter within last 12 months    Recent Outpatient Visits          2 months ago Claudication [facility])   [facility] [place], Drue Stager, MD   8 months ago Diabetes mellitus type 2 in obese [facility])   [facility] Steele Sizer, MD   1 year ago Diabetes mellitus type 2 in obese [facility])   [facility] Steele Sizer, MD   1 year ago Diabetes mellitus type 2 in obese [facility])   [facility] Steele Sizer, MD   1 year ago Diabetes mellitus type 2 in obese [facility])   [facility] Steele Sizer, MD      Future Appointments            In 3 weeks Reece Packer, Myna Hidalgo, [facility], [facility]

## 2021-11-20 ENCOUNTER — Ambulatory Visit (INDEPENDENT_AMBULATORY_CARE_PROVIDER_SITE_OTHER): Payer: Medicare Other | Admitting: Podiatry

## 2021-11-20 DIAGNOSIS — M79674 Pain in right toe(s): Secondary | ICD-10-CM

## 2021-11-20 DIAGNOSIS — B351 Tinea unguium: Secondary | ICD-10-CM

## 2021-11-20 DIAGNOSIS — M67471 Ganglion, right ankle and foot: Secondary | ICD-10-CM | POA: Diagnosis not present

## 2021-11-20 NOTE — Progress Notes (Signed)
   SUBJECTIVE Patient presents to office today complaining of elongated, thickened nails that cause pain while ambulating in shoes.  Patient is unable to trim their own nails.  Patient also has a new complaint today regarding a lesion that developed to the lateral aspect of the right foot about a week ago.  She denies a history of injury.  She says that it popped up out of nowhere.  She presents for further treatment and evaluation  Past Medical History:  Diagnosis Date   Achilles tendinitis of right lower extremity    Allergy    Asthma    Moderate   Chronic back pain    Depression    Diabetes mellitus without complication ([place])    Gastroparesis    DM   Hyperlipidemia    Hypertension    Hypoxemia    Irritable bowel syndrome    Maculopathy    NASH (nonalcoholic steatohepatitis)    Nuclear sclerosis of both eyes    OA (osteoarthritis)    Hips   OSA (obstructive sleep apnea)    CPAP   Post-void dribbling    Psoriasis    Regurgitation    SOB (shortness of breath)    Tear of left rotator cuff    TMJ (dislocation of temporomandibular joint)    Trochanteric bursitis of right hip    Wears hearing aid in both ears     OBJECTIVE General Patient is awake, alert, and oriented x 3 and in no acute distress. Derm Skin is dry and supple bilateral. Negative open lesions or macerations. Remaining integument unremarkable. Nails are tender, long, thickened and dystrophic with subungual debris, consistent with onychomycosis, 1-5 bilateral. No signs of infection noted. Vasc  DP and PT pedal pulses palpable bilaterally. Temperature gradient within normal limits.  Neuro Epicritic and protective threshold sensation grossly intact bilaterally.  Musculoskeletal Exam No symptomatic pedal deformities noted bilateral. Muscular strength within normal limits.  There is a ganglion cyst noted to the lateral aspect of the right foot.  Asymptomatic.  This is not painful.  It measures about 1.5 cm in  diameter  ASSESSMENT 1.  Pain due to onychomycosis of toenails both 2.  Ganglion cyst lateral aspect of the right foot  PLAN OF CARE 1. Patient evaluated today.  2. Instructed to maintain good pedal hygiene and foot care.  3. Mechanical debridement of nails 1-5 bilaterally performed using a nail nipper. Filed with dremel without incident.  4.  Pressure was applied to the ganglion cyst and it was immediately ruptured and resorbed into the surrounding soft tissues.  Patient tolerated this well and had no pain.   5.  Patient planning to move to [place] within the next 6 weeks as soon as her house sells.  No return appointment.  Follow-up with podiatry [place]   Edrick Kins, DPM [facility]  Dr. Edrick Kins, DPM    [address], [place] [address]                Office [phone]  Fax [phone]    Joint pain.

## 2021-11-22 NOTE — Progress Notes (Unsigned)
   Established Patient Office Visit  Subjective   Patient ID: Tauri Ethington, female    DOB: 06/09/1946  Age: 76 y.o. MRN: [id number]  No chief complaint on file.   HPI Kelani Robart is a 76 year old female here for follow up medical conditions and medication refills.     {History (Optional):23778}  ROS    Objective:     There were no vitals taken for this visit. {Vitals History (Optional):23777}  Physical Exam   No results found for any visits on 11/23/21.  {Labs (Optional):23779}  The 10-year ASCVD risk score (Arnett DK, et al., 2019) is: 60%    Assessment & Plan:   Problem List Items Addressed This Visit   None   No follow-ups on file.    Teodora Medici, DO

## 2021-11-23 ENCOUNTER — Encounter: Payer: Self-pay | Admitting: Internal Medicine

## 2021-11-23 ENCOUNTER — Ambulatory Visit (INDEPENDENT_AMBULATORY_CARE_PROVIDER_SITE_OTHER): Payer: Medicare Other | Admitting: Internal Medicine

## 2021-11-23 VITALS — BP 138/84 | HR 88 | Temp 98.3°F | Resp 16 | Ht 66.0 in | Wt 178.0 lb

## 2021-11-23 DIAGNOSIS — E669 Obesity, unspecified: Secondary | ICD-10-CM | POA: Diagnosis not present

## 2021-11-23 DIAGNOSIS — E785 Hyperlipidemia, unspecified: Secondary | ICD-10-CM

## 2021-11-23 DIAGNOSIS — I10 Essential (primary) hypertension: Secondary | ICD-10-CM

## 2021-11-23 DIAGNOSIS — E1169 Type 2 diabetes mellitus with other specified complication: Secondary | ICD-10-CM

## 2021-11-23 MED ORDER — METFORMIN HCL ER 750 MG PO TB24: ORAL_TABLET | ORAL | 2 refills | Status: DC

## 2021-11-23 MED ORDER — GLIPIZIDE ER 2.5 MG PO TB24: 2.5000 mg | ORAL_TABLET | Freq: Every morning | ORAL | 2 refills | Status: DC

## 2021-11-23 MED ORDER — PRAVASTATIN SODIUM 40 MG PO TABS: ORAL_TABLET | ORAL | 3 refills | Status: DC

## 2021-11-23 MED ORDER — SPIRONOLACTONE-HCTZ 25-25 MG PO TABS: 0.5000 | ORAL_TABLET | Freq: Every day | ORAL | 2 refills | Status: DC

## 2021-11-23 MED ORDER — OLMESARTAN MEDOXOMIL 20 MG PO TABS: 20.0000 mg | ORAL_TABLET | Freq: Every day | ORAL | 1 refills | Status: DC

## 2021-11-23 MED ORDER — GLIPIZIDE ER 5 MG PO TB24: 5.0000 mg | ORAL_TABLET | Freq: Every morning | ORAL | 2 refills | Status: DC

## 2021-11-23 NOTE — Patient Instructions (Signed)
It was great seeing you today!  Plan discussed at today's visit: -Medications refilled today   Follow up in:  Take care and let us know if you have any questions or concerns prior to your next visit.  Dr. Rosana Berger

## 2021-12-06 ENCOUNTER — Ambulatory Visit
Admission: RE | Admit: 2021-12-06 | Discharge: 2021-12-06 | Disposition: A | Discharge status: Home / Self Care | Payer: Medicare Other | Source: Ambulatory Visit | Attending: Family Medicine | Admitting: Family Medicine

## 2021-12-06 DIAGNOSIS — Z1231 Encounter for screening mammogram for malignant neoplasm of breast: Secondary | ICD-10-CM | POA: Diagnosis not present

## 2021-12-11 ENCOUNTER — Ambulatory Visit: Payer: Medicare Other | Admitting: Nurse Practitioner

## 2021-12-13 ENCOUNTER — Other Ambulatory Visit: Payer: Self-pay

## 2021-12-13 ENCOUNTER — Emergency Department
Admission: EM | Admit: 2021-12-13 | Discharge: 2021-12-13 | Disposition: A | Discharge status: Home / Self Care | Payer: Medicare Other | Attending: Student in an Organized Health Care Education/Training Program | Admitting: Student in an Organized Health Care Education/Training Program

## 2021-12-13 DIAGNOSIS — R0789 Other chest pain: Secondary | ICD-10-CM | POA: Diagnosis present

## 2021-12-13 DIAGNOSIS — Z7982 Long term (current) use of aspirin: Secondary | ICD-10-CM | POA: Diagnosis not present

## 2021-12-13 DIAGNOSIS — E119 Type 2 diabetes mellitus without complications: Secondary | ICD-10-CM | POA: Diagnosis not present

## 2021-12-13 DIAGNOSIS — E876 Hypokalemia: Secondary | ICD-10-CM

## 2021-12-13 DIAGNOSIS — F419 Anxiety disorder, unspecified: Secondary | ICD-10-CM | POA: Insufficient documentation

## 2021-12-13 DIAGNOSIS — I10 Essential (primary) hypertension: Secondary | ICD-10-CM | POA: Insufficient documentation

## 2021-12-13 DIAGNOSIS — Z79899 Other long term (current) drug therapy: Secondary | ICD-10-CM | POA: Diagnosis not present

## 2021-12-13 DIAGNOSIS — Z7984 Long term (current) use of oral hypoglycemic drugs: Secondary | ICD-10-CM | POA: Insufficient documentation

## 2021-12-13 LAB — BASIC METABOLIC PANEL
Anion gap: 8 (ref 5–15)
BUN: 13 mg/dL (ref 8–23)
CO2: 28 mmol/L (ref 22–32)
Calcium: 9.4 mg/dL (ref 8.9–10.3)
Chloride: 106 mmol/L (ref 98–111)
Creatinine, Ser: 0.76 mg/dL (ref 0.44–1.00)
GFR, Estimated: >60 mL/min (ref >60)
Glucose, Bld: 159 mg/dL — ABNORMAL HIGH (ref 70–99)
Potassium: 3.2 mmol/L — ABNORMAL LOW (ref 3.5–5.1)
Sodium: 142 mmol/L (ref 135–145)

## 2021-12-13 LAB — CBC
HCT: 39.2 % (ref 36.0–46.0)
Hemoglobin: 13 g/dL (ref 12.0–15.0)
MCH: 30.8 pg (ref 26.0–34.0)
MCHC: 33.2 g/dL (ref 30.0–36.0)
MCV: 92.9 fL (ref 80.0–100.0)
Platelets: 352 10*3/uL (ref 150–400)
RBC: 4.22 MIL/uL (ref 3.87–5.11)
RDW: 13.8 % (ref 11.5–15.5)
WBC: 9.4 10*3/uL (ref 4.0–10.5)
nRBC: 0 % (ref 0.0–0.2)

## 2021-12-13 LAB — TROPONIN I (HIGH SENSITIVITY): Troponin I (High Sensitivity): 14 ng/L (ref <18)
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
## 2021-12-13 NOTE — Discharge Instructions (Addendum)
Please continue with over-the-counter medications as needed for chest wall pain.  Return to the ER for any increasing pain worsening symptoms or urgency changes in your health such as weakness, fatigue, nausea or vomiting.  Please eat a diet rich in potassium.

## 2021-12-13 NOTE — ED Notes (Signed)
See triage note  presents s/p panic attack  states she feel much better at present   resp even and non labored

## 2022-01-09 ENCOUNTER — Ambulatory Visit: Payer: Medicare Other | Admitting: Family Medicine

## 2022-01-28 ENCOUNTER — Ambulatory Visit (INDEPENDENT_AMBULATORY_CARE_PROVIDER_SITE_OTHER): Payer: Medicare Other | Admitting: Family Medicine

## 2022-01-28 ENCOUNTER — Ambulatory Visit: Payer: Medicare Other | Admitting: Nurse Practitioner

## 2022-01-28 ENCOUNTER — Encounter: Payer: Self-pay | Admitting: Family Medicine

## 2022-01-28 VITALS — BP 128/80 | HR 83 | Temp 98.1°F | Resp 16 | Ht 66.0 in | Wt 168.7 lb

## 2022-01-28 DIAGNOSIS — R0789 Other chest pain: Secondary | ICD-10-CM | POA: Diagnosis not present

## 2022-01-28 DIAGNOSIS — N1831 Chronic kidney disease, stage 3a: Secondary | ICD-10-CM | POA: Diagnosis not present

## 2022-01-28 DIAGNOSIS — L4050 Arthropathic psoriasis, unspecified: Secondary | ICD-10-CM

## 2022-01-28 DIAGNOSIS — E1169 Type 2 diabetes mellitus with other specified complication: Secondary | ICD-10-CM

## 2022-01-28 DIAGNOSIS — N183 Chronic kidney disease, stage 3 unspecified: Secondary | ICD-10-CM

## 2022-01-28 DIAGNOSIS — E46 Unspecified protein-calorie malnutrition: Secondary | ICD-10-CM

## 2022-01-28 DIAGNOSIS — F331 Major depressive disorder, recurrent, moderate: Secondary | ICD-10-CM | POA: Diagnosis not present

## 2022-01-28 DIAGNOSIS — I129 Hypertensive chronic kidney disease with stage 1 through stage 4 chronic kidney disease, or unspecified chronic kidney disease: Secondary | ICD-10-CM

## 2022-01-28 DIAGNOSIS — E785 Hyperlipidemia, unspecified: Secondary | ICD-10-CM

## 2022-01-28 NOTE — Progress Notes (Signed)
Name: Mary Cantrell   MRN: [id number]    DOB: [birth year]   Date:01/28/2022       Progress Note  Subjective  Chief Complaint  Acute lump in neck   HPI  Pain on the clavicle sternum joint right side: she states noticed soreness and area being prominent, no redness or increase in warmth. No problems with rom of arm or shoulder  MDD and GAD: she moved to [place] for 6 weeks, in the mean time the sale of the house here fell through, they had sold most of their furniture because the byers wanted an empty house. They gave a lot of things away. While in [place] her husband decided he did not like living up [place]. They are now back in town with an empty house and sleeping on a mattress on the floor. She wants to have marital counseling, advised to contact her insurance   Malnourished: she has lack of appetite and lost 19 lbs in less than 5 months, she has been under more stress, states also not taste to food, but denies having COVID. She states her house is empty due to the move , sold everything and also stressed. Mammogram is up to date, denies blood in stools, no change in bowel movements   Patient Active Problem List   Diagnosis Date Noted   Hypertension 08/22/2021   Long term (current) use of systemic steroids 08/22/2021   Lumbar spondylosis 08/22/2021   Diabetes mellitus without complication ([place]) 96/28/3662   Osteoarthritis 08/22/2021   DDD (degenerative disc disease), lumbar 10/24/2020   Neuropathy 02/27/2020   Sensory ataxia 02/27/2020   Tremor 02/27/2020   Chronic kidney disease, stage III (moderate) ([place]) 12/09/2018   Dyslipidemia associated with type 2 diabetes mellitus ([place]) 12/09/2018   Obesity (BMI 30.0-34.9) 07/17/2018   Hypertension associated with stage 2 chronic kidney disease due to type 2 diabetes mellitus ([place]) 03/15/2018   Psoriatic arthritis ([place]) 12/04/2017   Stenosis of left carotid artery 12/16/2016   Polymyalgia rheumatica ([place]) 04/13/2015   Benign  hypertension with chronic kidney disease, stage III ([place]) 12/21/2014   Back pain, chronic 12/21/2014   Narrowing of intervertebral disc space 12/21/2014   Diabetic gastroparesis ([place]) 12/21/2014   Amaurosis fugax of left eye 12/21/2014   H/O: hypothyroidism 12/21/2014   Hyperlipidemia 12/21/2014   IBS (irritable bowel syndrome) 12/21/2014   Disorder of macula of retina 12/21/2014   Asthma, moderate 12/21/2014   NASH (nonalcoholic steatohepatitis) 12/21/2014   NS (nuclear sclerosis) 12/21/2014   Osteoarthrosis 12/21/2014   Psoriasis 12/21/2014   Knee torn cartilage 12/21/2014   Diabetes mellitus type 2 in obese ([place]) 12/21/2014   Obstructive apnea 01/03/2014   History of shoulder surgery 04/23/2011    Past Surgical History:  Procedure Laterality Date   ABDOMINAL HYSTERECTOMY     APPENDECTOMY     ARTERY BIOPSY N/A 02/23/2015   Procedure: BIOPSY TEMPORAL ARTERY;  Surgeon: Algernon Huxley, MD;  Location: [facility];  Service: Vascular;  Laterality: N/A;   BREAST BIOPSY Left    1990's   CATARACT EXTRACTION W/PHACO Left 07/24/2021   Procedure: CATARACT EXTRACTION PHACO AND INTRAOCULAR LENS PLACEMENT ([place]) LEFT DIABETIC 8.37 00:50.0;  Surgeon: Birder Robson, MD;  Location: [place];  Service: Ophthalmology;  Laterality: Left;  Diabetic   CATARACT EXTRACTION W/PHACO Right 08/07/2021   Procedure: CATARACT EXTRACTION PHACO AND INTRAOCULAR LENS PLACEMENT ([facility]) RIGHT DIABETIC 7.29 00:48.4;  Surgeon: Birder Robson, MD;  Location: [place];  Service: Ophthalmology;  Laterality: Right;  Diabetic   EXCISION / CURETTAGE BONE CYST FINGER     Left Index Finger   PILONIDAL CYST EXCISION     SHOULDER ARTHROSCOPY Right [id number]   Dr. Mauri Pole   STOMACH SURGERY  1941   3 arteries leaking in stomach after appendectomy   TONSILLECTOMY      Family History  Problem Relation Age of Onset   Rheum arthritis Father    Diabetes Father    Pneumonia Father    Pneumonia Mother     Diabetes Sister    Osteoarthritis Sister    Breast cancer Neg Hx     Social History   Tobacco Use   Smoking status: Never   Smokeless tobacco: Never  Substance Use Topics   Alcohol use: No    Alcohol/week: 0.0 standard drinks of alcohol     Current Outpatient Medications:    albuterol (VENTOLIN HFA) 108 (90 Base) MCG/ACT inhaler, 2 puffs as needed, Disp: , Rfl:    aspirin 81 MG EC tablet, Take 1 tablet by mouth in the morning, at noon, and at bedtime., Disp: , Rfl:    calcipotriene-betamethasone (TACLONEX) ointment, Apply 1 application topically daily., Disp: , Rfl:    Calcium Carbonate-Vit D-Min ([facility] CALCIUM 600 + D/MINERALS) 600-400 MG-UNIT TABS, Take 1 tablet by mouth daily., Disp: , Rfl: 1   cholecalciferol (VITAMIN D3) 25 MCG (1000 UNIT) tablet, Take 1,000 Units by mouth daily., Disp: , Rfl:    Cinnamon 500 MG capsule, Take 1,000 mg by mouth daily. , Disp: , Rfl:    cyanocobalamin 100 MCG tablet, Take 100 mcg by mouth 2 (two) times a week. , Disp: , Rfl:    dicyclomine (BENTYL) 20 MG tablet, TAKE 1 TABLET FOUR TIMES A DAY BEFORE MEALS AND AT BEDTIME, Disp: 360 tablet, Rfl: 0   fluticasone (FLONASE) 50 MCG/ACT nasal spray, USE 2 SPRAYS IN EACH NOSTRIL AT BEDTIME, Disp: 48 g, Rfl: 3   folic acid (FOLVITE) 1 MG tablet, Take 1 mg by mouth daily. , Disp: , Rfl:    glipiZIDE (GLUCOTROL XL) 2.5 MG 24 hr tablet, Take 1 tablet (2.5 mg total) by mouth every morning., Disp: 90 tablet, Rfl: 2   glipiZIDE (GLUCOTROL XL) 5 MG 24 hr tablet, Take 1 tablet (5 mg total) by mouth every morning., Disp: 90 tablet, Rfl: 2   glucose blood (ONE TOUCH ULTRA TEST) test strip, 1 each by Other route daily. Check fsbs once a day  E11.69, E66.9, Disp: 100 each, Rfl: 2   ipratropium (ATROVENT) 0.03 % nasal spray, Place 2 sprays into both nostrils every 12 (twelve) hours., Disp: 90 mL, Rfl: 1   metFORMIN (GLUCOPHAGE-XR) 750 MG 24 hr tablet, TAKE 2 TABLETS (1,500 MG TOTAL) DAILY WITH BREAKFAST, Disp: 180 tablet,  Rfl: 2   methotrexate (RHEUMATREX) 2.5 MG tablet, Take 20 mg by mouth once a week. Caution:Chemotherapy. Protect from light., Disp: , Rfl:    montelukast (SINGULAIR) 10 MG tablet, Take 1 tablet by mouth at bedtime., Disp: , Rfl:    Multiple Vitamins-Minerals (MULTIVITAMIN ADULTS PO), Take 1 tablet by mouth daily. , Disp: , Rfl:    olmesartan (BENICAR) 20 MG tablet, Take 1 tablet (20 mg total) by mouth daily. In place of losartan, Disp: 90 tablet, Rfl: 1   pravastatin (PRAVACHOL) 40 MG tablet, TAKE 1 TABLET DAILY  (AWAITING LABS OF NEXT VISIT FOR FURTHER REFILLS), Disp: 90 tablet, Rfl: 3   predniSONE (DELTASONE) 2.5 MG tablet, Take 2.5 mg by mouth daily., Disp: , Rfl:  predniSONE (DELTASONE) 5 MG tablet, Take 5 mg by mouth daily., Disp: , Rfl:    Propylene Glycol (SYSTANE BALANCE OP), Apply to eye. Only as needed, Disp: , Rfl:    tobramycin-dexamethasone (TOBRADEX) ophthalmic solution, Place 1 drop into the right eye 2 (two) times daily., Disp: , Rfl:    vitamin C (ASCORBIC ACID) 500 MG tablet, Take 1 tablet by mouth at bedtime. , Disp: , Rfl:    budesonide-formoterol (SYMBICORT) 160-4.5 MCG/ACT inhaler, Inhale 2 puffs into the lungs every morning., Disp: , Rfl:    spironolactone-hydrochlorothiazide (ALDACTAZIDE) 25-25 MG tablet, Take 0.5 tablets by mouth daily. (Patient not taking: Reported on 01/28/2022), Disp: 45 tablet, Rfl: 2  Allergies  Allergen Reactions   Doxycycline Other (See Comments)    Weakness, flu like symptoms   Codeine     Dizzy   Lantus  [Insulin Glargine]     made skin hot   Oxycodone     Dizziness   Rosuvastatin     Myalgia    Penicillin G Rash    And dizziness   Sitagliptin Rash   Sulfa Antibiotics Rash    Other reaction(s):   Red burning skin   Wound Dressing Adhesive Rash    Surgical tape adhesive    I personally reviewed active problem list, medication list, allergies, family history, social history with the patient/caregiver today.   ROS  Ten systems  reviewed and is negative except as mentioned in HPI   Objective  Vitals:   01/28/22 1330  BP: 128/80  Pulse: 83  Resp: 16  Temp: 98.1 F (36.7 C)  TempSrc: Oral  SpO2: 98%  Weight: 168 lb 11.2 oz (76.5 kg)  Height: 5' 6"  (1.676 m)    Body mass index is 27.23 kg/m.  Physical Exam  Constitutional: Patient appears well-developed and malnourished   No distress.  HEENT: head atraumatic, normocephalic, pupils equal and reactive to light,, neck supple Cardiovascular: Normal rate, regular rhythm and normal heart sounds.  No murmur heard. No BLE edema. Pulmonary/Chest: Effort normal and breath sounds normal. No respiratory distress. Abdominal: Soft.  There is no tenderness. Muscular skeletal: pain on right clavicle near sternum, some prominence  Psychiatric: Patient has a normal mood and affect. behavior is normal. Judgment and thought content normal.   Recent Results (from the past 2160 hour(s))  Basic metabolic panel     Status: Abnormal   Collection Time: 12/13/21  5:46 PM  Result Value Ref Range   Sodium 142 135 - 145 mmol/L   Potassium 3.2 (L) 3.5 - 5.1 mmol/L   Chloride 106 98 - 111 mmol/L   CO2 28 22 - 32 mmol/L   Glucose, Bld 159 (H) 70 - 99 mg/dL    Comment: Glucose reference range applies only to samples taken after fasting for at least 8 hours.   BUN 13 8 - 23 mg/dL   Creatinine, Ser 0.76 0.44 - 1.00 mg/dL   Calcium 9.4 8.9 - 10.3 mg/dL   GFR, Estimated >60 >60 mL/min    Comment: (NOTE) Calculated using the CKD-EPI Creatinine Equation (2021)    Anion gap 8 5 - 15    Comment: Performed at [facility], [address], [place] [address]  CBC     Status: None   Collection Time: 12/13/21  5:46 PM  Result Value Ref Range   WBC 9.4 4.0 - 10.5 K/uL   RBC 4.22 3.87 - 5.11 MIL/uL   Hemoglobin 13.0 12.0 - 15.0 g/dL  HCT 39.2 36.0 - 46.0 %   MCV 92.9 80.0 - 100.0 fL   MCH 30.8 26.0 - 34.0 pg   MCHC 33.2 30.0 - 36.0 g/dL   RDW 13.8 11.5 - 15.5 %    Platelets 352 150 - 400 K/uL   nRBC 0.0 0.0 - 0.2 %    Comment: Performed at [facility], [place], [place] [address]  Troponin I (High Sensitivity)     Status: None   Collection Time: 12/13/21  5:46 PM  Result Value Ref Range   Troponin I (High Sensitivity) 14 <18 ng/L    Comment: (NOTE) Elevated high sensitivity troponin I (hsTnI) values and significant  changes across serial measurements may suggest ACS but many other  chronic and acute conditions are known to elevate hsTnI results.  Refer to the "Links" section for chest pain algorithms and additional  guidance. Performed at [facility], [address] [place], [place] [address]      PHQ2/9:    01/28/2022    1:33 PM 11/23/2021    1:18 PM 08/22/2021    2:24 PM 02/28/2021    1:30 PM 10/24/2020    2:24 PM  Depression screen PHQ 2/9  Decreased Interest 2 0 0 1 0  Down, Depressed, Hopeless 3 0 0 1 0  PHQ - 2 Score 5 0 0 2 0  Altered sleeping 0  0 0   Tired, decreased energy 3  0 0   Change in appetite 3  0 3   Feeling bad or failure about yourself  3  0 0   Trouble concentrating 3  0 0   Moving slowly or fidgety/restless 3  0 0   Suicidal thoughts 3  0 0   PHQ-9 Score 23  0 5   Difficult doing work/chores Somewhat difficult  Not difficult at all      phq 9 is positive   Fall Risk:    01/28/2022    1:23 PM 11/23/2021    1:18 PM 08/22/2021    2:24 PM 02/28/2021    1:29 PM 10/24/2020    2:24 PM  Fall Risk   Falls in the past year? 0 0 1 0 0  Number falls in past yr:  0 0 0 0  Injury with Fall?  0 1 0 0  Risk for fall due to : No Fall Risks  Impaired balance/gait Impaired balance/gait   Follow up Falls prevention discussed Falls prevention discussed Falls prevention discussed Falls prevention discussed      Functional Status Survey: Is the patient deaf or have difficulty hearing?: Yes Does the patient have difficulty seeing, even when wearing glasses/contacts?: Yes Does the patient have  difficulty concentrating, remembering, or making decisions?: Yes Does the patient have difficulty walking or climbing stairs?: Yes Does the patient have difficulty dressing or bathing?: Yes Does the patient have difficulty doing errands alone such as visiting a doctor's office or shopping?: Yes    Assessment & Plan  1. Chest pain, mid sternal  Reassurance given   2. Dyslipidemia associated with type 2 diabetes mellitus (HCC)  - Lipid panel - Microalbumin / creatinine urine ratio - Hemoglobin A1c  3. Stage 3a chronic kidney disease (HCC)  - Microalbumin / creatinine urine ratio - VITAMIN D 25 Hydroxy (Vit-D Deficiency, Fractures)  4. Moderate episode of recurrent major depressive disorder ([place])  She refuses medication   5. Malnutrition, unspecified type ([place])  - CBC with Differential/Platelet - COMPLETE METABOLIC  PANEL WITH GFR - VITAMIN D 25 Hydroxy (Vit-D Deficiency, Fractures) - Vitamin B12 - TSH  6. Psoriatic arthritis ([place])  Keep follow up with rheumatologist   7. Benign hypertension with chronic kidney disease, stage III ([place])

## 2022-01-29 LAB — CBC WITH DIFFERENTIAL/PLATELET
Absolute Monocytes: 322 cells/uL (ref 200–950)
Basophils Absolute: 46 cells/uL (ref 0–200)
Basophils Relative: 0.5 %
Eosinophils Absolute: 37 cells/uL (ref 15–500)
Eosinophils Relative: 0.4 %
HCT: 37.3 % (ref 35.0–45.0)
Hemoglobin: 13 g/dL (ref 11.7–15.5)
Lymphs Abs: 1040 cells/uL (ref 850–3900)
MCH: 32 pg (ref 27.0–33.0)
MCHC: 34.9 g/dL (ref 32.0–36.0)
MCV: 91.9 fL (ref 80.0–100.0)
MPV: 10 fL (ref 7.5–12.5)
Monocytes Relative: 3.5 %
Neutro Abs: 7756 cells/uL (ref 1500–7800)
Neutrophils Relative %: 84.3 %
Platelets: 304 10*3/uL (ref 140–400)
RBC: 4.06 10*6/uL (ref 3.80–5.10)
RDW: 13.5 % (ref 11.0–15.0)
Total Lymphocyte: 11.3 %
WBC: 9.2 10*3/uL (ref 3.8–10.8)

## 2022-01-29 LAB — MICROALBUMIN / CREATININE URINE RATIO
Creatinine, Urine: 69 mg/dL (ref 20–275)
Microalb Creat Ratio: 22 mcg/mg creat (ref <30)
Microalb, Ur: 1.5 mg/dL

## 2022-01-29 LAB — HEMOGLOBIN A1C
Hgb A1c MFr Bld: 6.1 % of total Hgb — ABNORMAL HIGH (ref <5.7)
Mean Plasma Glucose: 128 mg/dL
eAG (mmol/L): 7.1 mmol/L

## 2022-01-29 LAB — LIPID PANEL
Cholesterol: 176 mg/dL (ref <200)
HDL: 73 mg/dL (ref >=50)
LDL Cholesterol (Calc): 83 mg/dL (calc)
Non-HDL Cholesterol (Calc): 103 mg/dL (calc) (ref <130)
Total CHOL/HDL Ratio: 2.4 (calc) (ref <5.0)
Triglycerides: 104 mg/dL (ref <150)

## 2022-01-29 LAB — COMPLETE METABOLIC PANEL WITH GFR
AG Ratio: 2.3 (calc) (ref 1.0–2.5)
ALT: 18 U/L (ref 6–29)
AST: 18 U/L (ref 10–35)
Albumin: 4.3 g/dL (ref 3.6–5.1)
Alkaline phosphatase (APISO): 82 U/L (ref 37–153)
BUN: 19 mg/dL (ref 7–25)
CO2: 30 mmol/L (ref 20–32)
Calcium: 9.6 mg/dL (ref 8.6–10.4)
Chloride: 105 mmol/L (ref 98–110)
Creat: 0.75 mg/dL (ref 0.60–1.00)
Globulin: 1.9 g/dL (calc) (ref 1.9–3.7)
Glucose, Bld: 168 mg/dL — ABNORMAL HIGH (ref 65–99)
Potassium: 3.7 mmol/L (ref 3.5–5.3)
Sodium: 144 mmol/L (ref 135–146)
Total Bilirubin: 0.5 mg/dL (ref 0.2–1.2)
Total Protein: 6.2 g/dL (ref 6.1–8.1)
eGFR: 82 mL/min/{1.73_m2} (ref >=60)

## 2022-01-29 LAB — TSH: TSH: 0.71 mIU/L (ref 0.40–4.50)

## 2022-01-29 LAB — VITAMIN B12: Vitamin B-12: 945 pg/mL (ref 200–1100)

## 2022-01-29 LAB — VITAMIN D 25 HYDROXY (VIT D DEFICIENCY, FRACTURES): Vit D, 25-Hydroxy: 53 ng/mL (ref 30–100)

## 2022-02-14 ENCOUNTER — Ambulatory Visit: Payer: Self-pay

## 2022-02-14 NOTE — Patient Outreach (Signed)
  Care Coordination   02/14/2022 Name: Mary Cantrell MRN: [id number] DOB: [birth year]   Care Coordination Outreach Attempts:  An unsuccessful telephone outreach was attempted today to offer the patient information about available care coordination services as a benefit of their health plan.   Follow Up Plan:  Additional outreach attempts will be made to offer the patient care coordination information and services.   Encounter Outcome:  No Answer  Care Coordination Interventions Activated:  No   Care Coordination Interventions:  No, not indicated    Noreene Larsson RN, MSN, CCM Community Care Coordinator [facility] Mobile: [phone]

## 2022-03-11 ENCOUNTER — Ambulatory Visit: Payer: PRIVATE HEALTH INSURANCE | Admitting: Family Medicine

## 2022-03-15 ENCOUNTER — Other Ambulatory Visit: Payer: Self-pay | Admitting: Family Medicine

## 2022-03-15 DIAGNOSIS — K580 Irritable bowel syndrome with diarrhea: Secondary | ICD-10-CM

## 2022-04-13 ENCOUNTER — Other Ambulatory Visit: Payer: Self-pay | Admitting: Family Medicine

## 2022-04-13 DIAGNOSIS — E1169 Type 2 diabetes mellitus with other specified complication: Secondary | ICD-10-CM

## 2022-04-17 ENCOUNTER — Telehealth: Payer: Self-pay | Admitting: Family Medicine

## 2022-04-17 ENCOUNTER — Other Ambulatory Visit: Payer: Self-pay | Admitting: Family Medicine

## 2022-04-17 NOTE — Telephone Encounter (Unsigned)
Copied from [place] [id number]. Topic: General - Other >> Apr 17, 2022  1:46 PM Ja-Kwan M wrote: Reason for CRM: Pt reports that if he medications are not approved she will just stop taking them and start looking for a new doctor. Pt scheduled an appt for 04/30/22

## 2022-04-17 NOTE — Telephone Encounter (Signed)
Pt called back very irate stating the meds were sent again to local pharmacy and not [facility] and just to forget it that she had had it, pt hung up.

## 2022-04-17 NOTE — Telephone Encounter (Signed)
Called patient and left vm that her medicines were sent to [facility] with multiple refills. I left callback number for anymore questions or if rx needs to go to a different pharmacy.

## 2022-04-18 NOTE — Telephone Encounter (Signed)
Copied from [place] [id number]. Topic: General - Other >> Apr 17, 2022  5:24 PM Ja-Kwan M wrote: Reason for CRM: Pt stated she received a message that her prescriptions were sent but she has not received them and when she requested a refill she was told an appt was needed. Pt stated she has an appt and when her pills run out she just will not take them. Pt also asked that no messages be sent to her [facility] because she is unable to get in to it.

## 2022-04-18 NOTE — Telephone Encounter (Signed)
Called and spoke with patient. I informed her Glipizide, Metformin, and Pravastatin were all sent to [facility] 11/23/21 for a 3 month supply w/2 additional refills (9 month supply). [facility] confirmed they received all 3 rx on 11/23/21 at 1:41pm. We [facility] not [facility]. Patient said "okay" and hung up.

## 2022-04-22 ENCOUNTER — Telehealth: Payer: Self-pay | Admitting: Family Medicine

## 2022-04-22 ENCOUNTER — Encounter (INDEPENDENT_AMBULATORY_CARE_PROVIDER_SITE_OTHER): Payer: Self-pay

## 2022-04-22 NOTE — Telephone Encounter (Unsigned)
Copied from [place] [phone]. Topic: General - Other >> Apr 22, 2022 10:07 AM Chapman Fitch wrote: Reason for CRM: Pt would like a list of all her prescriptions to her email / [email or web address] please advise once sent

## 2022-04-22 NOTE — Telephone Encounter (Signed)
Patient called in insisting, that list of her medication be sent to her email. She says My chart doesn't work for her and didn't want more assistance with that. Email is [email or web address]

## 2022-04-23 NOTE — Telephone Encounter (Signed)
We are unable to do this by email but she can come and pick a copy up at her convenience. Patient notified.

## 2022-04-24 ENCOUNTER — Other Ambulatory Visit: Payer: Self-pay

## 2022-04-24 DIAGNOSIS — E1169 Type 2 diabetes mellitus with other specified complication: Secondary | ICD-10-CM

## 2022-04-24 MED ORDER — PRAVASTATIN SODIUM 40 MG PO TABS: ORAL_TABLET | ORAL | 0 refills | Status: DC

## 2022-04-30 ENCOUNTER — Ambulatory Visit: Payer: Medicare Other | Admitting: Family Medicine

## 2022-05-14 NOTE — Progress Notes (Unsigned)
There were no vitals taken for this visit.   Subjective:    Patient ID: Mary Cantrell, female    DOB: 12/05/1945, 76 y.o.   MRN: [id number]  HPI: Mary Cantrell is a 76 y.o. female  No chief complaint on file.  Sore throat:  Relevant past medical, surgical, family and social history reviewed and updated as indicated. Interim medical history since our last visit reviewed. Allergies and medications reviewed and updated.  Review of Systems  Constitutional: Negative for fever or weight change.  Respiratory: Negative for cough and shortness of breath.   Cardiovascular: Negative for chest pain or palpitations.  Gastrointestinal: Negative for abdominal pain, no bowel changes.  Musculoskeletal: Negative for gait problem or joint swelling.  Skin: Negative for rash.  Neurological: Negative for dizziness or headache.  No other specific complaints in a complete review of systems (except as listed in HPI above).      Objective:    There were no vitals taken for this visit.  Wt Readings from Last 3 Encounters:  01/28/22 168 lb 11.2 oz (76.5 kg)  12/13/21 177 lb 14.6 oz (80.7 kg)  11/23/21 178 lb (80.7 kg)    Physical Exam  Constitutional: Patient appears well-developed and well-nourished. Obese *** No distress.  HEENT: head atraumatic, normocephalic, pupils equal and reactive to light, ears ***, neck supple, throat within normal limits Cardiovascular: Normal rate, regular rhythm and normal heart sounds.  No murmur heard. No BLE edema. Pulmonary/Chest: Effort normal and breath sounds normal. No respiratory distress. Abdominal: Soft.  There is no tenderness. Psychiatric: Patient has a normal mood and affect. behavior is normal. Judgment and thought content normal.  Results for orders placed or performed in visit on 01/28/22  Lipid panel  Result Value Ref Range   Cholesterol 176 <200 mg/dL   HDL 73 > OR = 50 mg/dL   Triglycerides 104 <150 mg/dL   LDL Cholesterol (Calc) 83 mg/dL (calc)    Total CHOL/HDL Ratio 2.4 <5.0 (calc)   Non-HDL Cholesterol (Calc) 103 <130 mg/dL (calc)  Microalbumin / creatinine urine ratio  Result Value Ref Range   Creatinine, Urine 69 20 - 275 mg/dL   Microalb, Ur 1.5 mg/dL   Microalb Creat Ratio 22 <30 mcg/mg creat  CBC with Differential/Platelet  Result Value Ref Range   WBC 9.2 3.8 - 10.8 Thousand/uL   RBC 4.06 3.80 - 5.10 Million/uL   Hemoglobin 13.0 11.7 - 15.5 g/dL   HCT 37.3 35.0 - 45.0 %   MCV 91.9 80.0 - 100.0 fL   MCH 32.0 27.0 - 33.0 pg   MCHC 34.9 32.0 - 36.0 g/dL   RDW 13.5 11.0 - 15.0 %   Platelets 304 140 - 400 Thousand/uL   MPV 10.0 7.5 - 12.5 fL   Neutro Abs 7,756 1,500 - 7,800 cells/uL   Lymphs Abs 1,040 850 - 3,900 cells/uL   Absolute Monocytes 322 200 - 950 cells/uL   Eosinophils Absolute 37 15 - 500 cells/uL   Basophils Absolute 46 0 - 200 cells/uL   Neutrophils Relative % 84.3 %   Total Lymphocyte 11.3 %   Monocytes Relative 3.5 %   Eosinophils Relative 0.4 %   Basophils Relative 0.5 %  COMPLETE METABOLIC PANEL WITH GFR  Result Value Ref Range   Glucose, Bld 168 (H) 65 - 99 mg/dL   BUN 19 7 - 25 mg/dL   Creat 0.75 0.60 - 1.00 mg/dL   eGFR 82 > OR = 60 mL/min/1.80m   BUN/Creatinine Ratio  SEE NOTE: 6 - 22 (calc)   Sodium 144 135 - 146 mmol/L   Potassium 3.7 3.5 - 5.3 mmol/L   Chloride 105 98 - 110 mmol/L   CO2 30 20 - 32 mmol/L   Calcium 9.6 8.6 - 10.4 mg/dL   Total Protein 6.2 6.1 - 8.1 g/dL   Albumin 4.3 3.6 - 5.1 g/dL   Globulin 1.9 1.9 - 3.7 g/dL (calc)   AG Ratio 2.3 1.0 - 2.5 (calc)   Total Bilirubin 0.5 0.2 - 1.2 mg/dL   Alkaline phosphatase (APISO) 82 37 - 153 U/L   AST 18 10 - 35 U/L   ALT 18 6 - 29 U/L  Hemoglobin A1c  Result Value Ref Range   Hgb A1c MFr Bld 6.1 (H) <5.7 % of total Hgb   Mean Plasma Glucose 128 mg/dL   eAG (mmol/L) 7.1 mmol/L  VITAMIN D 25 Hydroxy (Vit-D Deficiency, Fractures)  Result Value Ref Range   Vit D, 25-Hydroxy 53 30 - 100 ng/mL  Vitamin B12  Result Value Ref  Range   Vitamin B-12 945 200 - 1,100 pg/mL  TSH  Result Value Ref Range   TSH 0.71 0.40 - 4.50 mIU/L      Assessment & Plan:   Problem List Items Addressed This Visit   None    Follow up plan: No follow-ups on file.

## 2022-05-15 ENCOUNTER — Ambulatory Visit (INDEPENDENT_AMBULATORY_CARE_PROVIDER_SITE_OTHER): Payer: Medicare Other | Admitting: Nurse Practitioner

## 2022-05-15 ENCOUNTER — Encounter: Payer: Self-pay | Admitting: Nurse Practitioner

## 2022-05-15 VITALS — BP 130/82 | HR 80 | Temp 98.9°F | Resp 20 | Ht 66.0 in | Wt 179.6 lb

## 2022-05-15 DIAGNOSIS — J4520 Mild intermittent asthma, uncomplicated: Secondary | ICD-10-CM | POA: Diagnosis not present

## 2022-05-15 DIAGNOSIS — R059 Cough, unspecified: Secondary | ICD-10-CM

## 2022-05-15 DIAGNOSIS — J029 Acute pharyngitis, unspecified: Secondary | ICD-10-CM

## 2022-05-15 DIAGNOSIS — R0981 Nasal congestion: Secondary | ICD-10-CM

## 2022-05-15 LAB — POCT RAPID STREP A (OFFICE): Rapid Strep A Screen: NEGATIVE

## 2022-05-15 LAB — POCT INFLUENZA A/B
Influenza A, POC: NEGATIVE
Influenza B, POC: NEGATIVE

## 2022-05-15 MED ORDER — BENZONATATE 100 MG PO CAPS: 200.0000 mg | ORAL_CAPSULE | Freq: Two times a day (BID) | ORAL | 0 refills | Status: DC | PRN

## 2022-05-15 MED ORDER — PROMETHAZINE-DM 6.25-15 MG/5ML PO SYRP: 5.0000 mL | ORAL_SOLUTION | Freq: Four times a day (QID) | ORAL | 0 refills | Status: DC | PRN

## 2022-05-16 LAB — NOVEL CORONAVIRUS, NAA: SARS-CoV-2, NAA: NOT DETECTED

## 2022-05-21 ENCOUNTER — Ambulatory Visit: Payer: Self-pay | Admitting: *Deleted

## 2022-05-21 ENCOUNTER — Other Ambulatory Visit: Payer: Self-pay | Admitting: Nurse Practitioner

## 2022-05-21 DIAGNOSIS — R059 Cough, unspecified: Secondary | ICD-10-CM

## 2022-05-21 MED ORDER — BENZONATATE 100 MG PO CAPS: 200.0000 mg | ORAL_CAPSULE | Freq: Two times a day (BID) | ORAL | 0 refills | Status: DC | PRN

## 2022-05-21 MED ORDER — PROMETHAZINE-DM 6.25-15 MG/5ML PO SYRP: 5.0000 mL | ORAL_SOLUTION | Freq: Four times a day (QID) | ORAL | 0 refills | Status: DC | PRN

## 2022-05-21 NOTE — Telephone Encounter (Signed)
Summary: cough/ rx request   Pt states promethazine-dextromethorphan (PROMETHAZINE-DM) 6.25-15 MG/5ML syrup and benzonatate (TESSALON) 100 MG capsule is not helping w/ cough  Pt requesting another Rx  Please assist further

## 2022-05-21 NOTE — Telephone Encounter (Signed)
1800:    2nd attempt to reach pt, left VM each attempt. Routing to provider for resolution per protocol.

## 2022-05-22 NOTE — Telephone Encounter (Signed)
Patient notified

## 2022-05-27 ENCOUNTER — Ambulatory Visit: Payer: Self-pay | Admitting: *Deleted

## 2022-05-27 NOTE — Telephone Encounter (Signed)
  Chief Complaint: Cough Symptoms: Pt seen 05/15/22. Prescribed Promethazine and Benzonatate 05/21/22. States meds are not working. Reports wheezing, temp. 99.0. SOB  "Normal for me when I'm sick,with asthma." Frequency: Seen 05/15/22 Pertinent Negatives: Patient denies  Disposition: [] ED /[] Urgent Care (no appt availability in office) / [x] Appointment(In office/virtual)/ []  [facility]/ [] Home Care/ [] Refused Recommended Disposition /[] [place] [facility]/ []  Follow-up with PCP Additional Notes: Appt secured for tomorrow AM. Care advise provided, pt verbalizes understanding.  Pt requested AM appt. Reason for Disposition  [1] Known COPD or other severe lung disease (i.e., bronchiectasis, cystic fibrosis, lung surgery) AND [2] worsening symptoms (i.e., increased sputum purulence or amount, increased breathing difficulty  Answer Assessment - Initial Assessment Questions 1. ONSET: "When did the cough begin?"      Has been seen 05/15/22 2. SEVERITY: "How bad is the cough today?"      "In chest now" 3. SPUTUM: "Describe the color of your sputum" (none, dry cough; clear, white, yellow, green)     Yellow 4. HEMOPTYSIS: "Are you coughing up any blood?" If so ask: "How much?" (flecks, streaks, tablespoons, etc.)     No 5. DIFFICULTY BREATHING: "Are you having difficulty breathing?" If Yes, ask: "How bad is it?" (e.g., mild, moderate, severe)    - MILD: No SOB at rest, mild SOB with walking, speaks normally in sentences, can lie down, no retractions, pulse < 100.    - MODERATE: SOB at rest, SOB with minimal exertion and prefers to sit, cannot lie down flat, speaks in phrases, mild retractions, audible wheezing, pulse 100-120.    - SEVERE: Very SOB at rest, speaks in single words, struggling to breathe, sitting hunched forward, retractions, pulse > 120      SOB "Normal" 6. FEVER: "Do you have a fever?" If Yes, ask: "What is your temperature, how was it measured, and when did it start?"      99.0 7. CARDIAC HISTORY: "Do you have any history of heart disease?" (e.g., heart attack, congestive heart failure)       8. LUNG HISTORY: "Do you have any history of lung disease?"  (e.g., pulmonary embolus, asthma, emphysema)     Asthma 9. PE RISK FACTORS: "Do you have a history of blood clots?" (or: recent major surgery, recent prolonged travel, bedridden)     no 10. OTHER SYMPTOMS: "Do you have any other symptoms?" (e.g., runny nose, wheezing, chest pain)       Wheezing, chest congestion  Protocols used: Cough - Acute Productive-A-AH

## 2022-05-27 NOTE — Telephone Encounter (Signed)
Summary: Flu symptoms, no appt soon enough   Pt says she is not feeling better after taking benzonatate. Believes her symptoms have now travelled down into her chest.  Best contact: [phone]    Attempted to call patient- left message to call office on VM

## 2022-05-28 ENCOUNTER — Ambulatory Visit (INDEPENDENT_AMBULATORY_CARE_PROVIDER_SITE_OTHER): Payer: Medicare Other | Admitting: Physician Assistant

## 2022-05-28 ENCOUNTER — Telehealth: Payer: Self-pay | Admitting: *Deleted

## 2022-05-28 ENCOUNTER — Encounter: Payer: Self-pay | Admitting: Physician Assistant

## 2022-05-28 ENCOUNTER — Ambulatory Visit
Admission: RE | Admit: 2022-05-28 | Discharge: 2022-05-28 | Disposition: A | Discharge status: Home / Self Care | Payer: Medicare Other | Source: Ambulatory Visit | Attending: Physician Assistant | Admitting: Physician Assistant

## 2022-05-28 ENCOUNTER — Ambulatory Visit
Admission: RE | Admit: 2022-05-28 | Discharge: 2022-05-28 | Disposition: A | Discharge status: Home / Self Care | Payer: Medicare Other | Attending: Physician Assistant | Admitting: Physician Assistant

## 2022-05-28 VITALS — BP 166/80 | HR 88 | Temp 98.4°F | Resp 16 | Ht 66.0 in | Wt 176.2 lb

## 2022-05-28 DIAGNOSIS — R0989 Other specified symptoms and signs involving the circulatory and respiratory systems: Secondary | ICD-10-CM

## 2022-05-28 DIAGNOSIS — R059 Cough, unspecified: Secondary | ICD-10-CM | POA: Diagnosis present

## 2022-05-28 DIAGNOSIS — J45901 Unspecified asthma with (acute) exacerbation: Secondary | ICD-10-CM | POA: Insufficient documentation

## 2022-05-28 MED ORDER — AZITHROMYCIN 250 MG PO TABS: ORAL_TABLET | ORAL | 0 refills | Status: DC

## 2022-05-28 NOTE — Assessment & Plan Note (Signed)
Chronic, ongoing condition Concern for exacerbation given recent illness Recommend she use her Albuterol inhaler for SOB and coughing spells and continue with her other medications as directed Will also provide Zpack to assist with inflammation Follow up as needed for persistent or progressing symptoms

## 2022-05-28 NOTE — Progress Notes (Signed)
Acute Office Visit   Patient: Mary Cantrell   DOB: 07-09-1945   76 y.o. Female  MRN: [id number] Visit Date: 05/28/2022  Today's healthcare provider: Dani Gobble Connell Bognar, PA-C  Introduced myself to the patient as a Journalist, newspaper and provided education on APPs in clinical practice.    Chief Complaint  Patient presents with   Follow-up   Cough    No better   Subjective    HPI HPI     Cough    Additional comments: No better      Last edited by Salomon Fick, CMA on 05/28/2022  8:47 AM.       URI -type symptoms Reports chest congestion, coughing and sneezing   Onset:gradual  Duration: 2 weeks  Fever: tmax 99.0 at home  Sore throat: no - this is gone now Nausea: no Vomiting:no Diarrhea:no Chest pain: yes states she was also having swelling in her chest from costochondritis but this is resolving  SOB:yes -  Productive cough: yes with yellow sputum Headaches:no Dizziness:no Ear pain:no Recent sick contacts: none to her knowledge  Alleviating:nothing except for sleeping  Aggravating: talking  Intervention: She has been taking her inhalers, tessalon pearls and cough syrup from previous visit, sudafed  COVID testing at home: negative on 05/15/22 Tested negative for flu a and b and strep     Reports she has not taken her BP medications yet this AM      Medications: Outpatient Medications Prior to Visit  Medication Sig   albuterol (VENTOLIN HFA) 108 (90 Base) MCG/ACT inhaler 2 puffs as needed   aspirin 81 MG EC tablet Take 1 tablet by mouth in the morning, at noon, and at bedtime.   benzonatate (TESSALON) 100 MG capsule Take 2 capsules (200 mg total) by mouth 2 (two) times daily as needed for cough.   calcipotriene-betamethasone (TACLONEX) ointment Apply 1 application topically daily.   Calcium Carbonate-Vit D-Min ([employer] CALCIUM 600 + D/MINERALS) 600-400 MG-UNIT TABS Take 1 tablet by mouth daily.   cholecalciferol (VITAMIN D3) 25 MCG (1000 UNIT) tablet Take  1,000 Units by mouth daily.   Cinnamon 500 MG capsule Take 1,000 mg by mouth daily.    cyanocobalamin 100 MCG tablet Take 100 mcg by mouth 2 (two) times a week.    dicyclomine (BENTYL) 20 MG tablet TAKE 1 TABLET FOUR TIMES A DAY BEFORE MEALS AND AT BEDTIME   fluticasone (FLONASE) 50 MCG/ACT nasal spray USE 2 SPRAYS IN EACH NOSTRIL AT BEDTIME   folic acid (FOLVITE) 1 MG tablet Take 1 mg by mouth daily.    glipiZIDE (GLUCOTROL XL) 2.5 MG 24 hr tablet Take 1 tablet (2.5 mg total) by mouth every morning.   glipiZIDE (GLUCOTROL XL) 5 MG 24 hr tablet Take 1 tablet (5 mg total) by mouth every morning.   glucose blood (ONE TOUCH ULTRA TEST) test strip 1 each by Other route daily. Check fsbs once a day  E11.69, E66.9   ipratropium (ATROVENT) 0.03 % nasal spray Place 2 sprays into both nostrils every 12 (twelve) hours.   metFORMIN (GLUCOPHAGE-XR) 750 MG 24 hr tablet TAKE 2 TABLETS (1,500 MG TOTAL) DAILY WITH BREAKFAST   methotrexate (RHEUMATREX) 2.5 MG tablet Take 20 mg by mouth once a week. Caution:Chemotherapy. Protect from light.   montelukast (SINGULAIR) 10 MG tablet Take 1 tablet by mouth at bedtime.   Multiple Vitamins-Minerals (MULTIVITAMIN ADULTS PO) Take 1 tablet by mouth daily.    olmesartan (BENICAR) 20 MG tablet  Take 1 tablet (20 mg total) by mouth daily. In place of losartan   pravastatin (PRAVACHOL) 40 MG tablet TAKE 1 TABLET DAILY  (AWAITING LABS OF NEXT VISIT FOR FURTHER REFILLS)   predniSONE (DELTASONE) 2.5 MG tablet Take 2.5 mg by mouth daily.   predniSONE (DELTASONE) 5 MG tablet Take 5 mg by mouth daily.   promethazine-dextromethorphan (PROMETHAZINE-DM) 6.25-15 MG/5ML syrup Take 5 mLs by mouth 4 (four) times daily as needed for cough.   Propylene Glycol (SYSTANE BALANCE OP) Apply to eye. Only as needed   tobramycin-dexamethasone (TOBRADEX) ophthalmic solution Place 1 drop into the right eye 2 (two) times daily.   vitamin C (ASCORBIC ACID) 500 MG tablet Take 1 tablet by mouth at  bedtime.    budesonide-formoterol (SYMBICORT) 160-4.5 MCG/ACT inhaler Inhale 2 puffs into the lungs every morning.   No facility-administered medications prior to visit.    Review of Systems  Constitutional:  Negative for chills and fever.  HENT:  Positive for congestion, sinus pressure and sneezing. Negative for ear pain, postnasal drip, rhinorrhea, sinus pain and sore throat.   Respiratory:  Positive for cough and shortness of breath.   Cardiovascular:  Positive for chest pain.  Gastrointestinal:  Negative for diarrhea, nausea and vomiting.  Neurological:  Negative for dizziness, light-headedness and headaches.       Objective    BP (!) 166/80   Pulse 88   Temp 98.4 F (36.9 C) (Oral)   Resp 16   Ht 5' 6"  (1.676 m)   Wt 176 lb 3.2 oz (79.9 kg)   SpO2 94%   BMI 28.44 kg/m    Physical Exam Vitals reviewed.  Constitutional:      General: She is awake.     Appearance: Normal appearance. She is well-developed and well-groomed.  HENT:     Head: Normocephalic and atraumatic.     Right Ear: Tympanic membrane, ear canal and external ear normal.     Left Ear: Tympanic membrane, ear canal and external ear normal.  Neck:     Trachea: Phonation normal.  Cardiovascular:     Rate and Rhythm: Normal rate and regular rhythm.     Pulses: Normal pulses.     Heart sounds: Normal heart sounds. No murmur heard.    No friction rub. No gallop.  Pulmonary:     Effort: Pulmonary effort is normal.     Breath sounds: Decreased breath sounds and wheezing present.  Musculoskeletal:     Cervical back: Normal range of motion.  Lymphadenopathy:     Head:     Right side of head: No submental, submandibular or preauricular adenopathy.     Left side of head: No submental, submandibular or preauricular adenopathy.     Cervical:     Right cervical: No superficial or posterior cervical adenopathy.    Left cervical: No superficial or posterior cervical adenopathy.  Neurological:     Mental  Status: She is alert.  Psychiatric:        Behavior: Behavior is cooperative.       No results found for any visits on 05/28/22.  Assessment & Plan      No follow-ups on file.      Problem List Items Addressed This Visit       Respiratory   Asthma, moderate    Chronic, ongoing condition Concern for exacerbation given recent illness Recommend she use her Albuterol inhaler for SOB and coughing spells and continue with her other medications as directed Will also  provide Zpack to assist with inflammation Follow up as needed for persistent or progressing symptoms       Relevant Medications   azithromycin (ZITHROMAX) 250 MG tablet   Other Relevant Orders   DG Chest 2 View (Completed)   Other Visit Diagnoses     Cough, unspecified type    -  Primary   Relevant Medications   azithromycin (ZITHROMAX) 250 MG tablet   Other Relevant Orders   DG Chest 2 View (Completed)   Symptoms of upper respiratory infection (URI)     Acute, ongoing for the past 2 weeks  Reports lingering productive cough that is not improving with rx medications She has tested negative for COVID, Flu and Strep at previous visit Suspect this is likely lingering congestion and potentially bronchospasm She is already taking daily prednisone  Recommend she continue with these and add Mucinex to assist with congestion CXR was negative today for signs of pneumonia or acute pulmonary disease Will provide Zpack to assist with bronchial inflammation  Recommend she continue to use inhalers and antihistamines to assist with symptoms  Follow up as needed for persistent or progressing symptoms     Relevant Medications   azithromycin (ZITHROMAX) 250 MG tablet        No follow-ups on file.   I, Menachem Urbanek E Romario Tith, PA-C, have reviewed all documentation for this visit. The documentation on 05/28/22 for the exam, diagnosis, procedures, and orders are all accurate and complete.   Talitha Givens, MHS, PA-C [facility]

## 2022-05-28 NOTE — Telephone Encounter (Signed)
Pt returned call for imaging results. Message read to pt, verbalizes understanding.   "Your chest xray came back negative for signs of pneumonia or pulmonary illness I have sent in a script for a Z pack - this is an antibiotic that also helps with pulmonary inflammation so it should help with your breathing and coughing. Please take OTC medications such as Mucinex and Robitussin for your symptoms in addition to the antibiotic and use your inhaler as needed for shortness of breath."

## 2022-07-09 ENCOUNTER — Other Ambulatory Visit: Payer: Self-pay | Admitting: Family Medicine

## 2022-07-09 DIAGNOSIS — E1169 Type 2 diabetes mellitus with other specified complication: Secondary | ICD-10-CM

## 2022-07-10 ENCOUNTER — Other Ambulatory Visit: Payer: Self-pay

## 2022-07-10 DIAGNOSIS — E1169 Type 2 diabetes mellitus with other specified complication: Secondary | ICD-10-CM

## 2022-07-10 NOTE — Telephone Encounter (Signed)
Pt said she does not need this medication and she made a followup appt

## 2022-07-10 NOTE — Telephone Encounter (Signed)
Pt states she does have enough meds

## 2022-07-15 ENCOUNTER — Encounter: Payer: Self-pay | Admitting: Family Medicine

## 2022-07-19 LAB — BASIC METABOLIC PANEL
BUN: 17 (ref 4–21)
CO2: 27 — AB (ref 13–22)
Chloride: 101 (ref 99–108)
Creatinine: 0.8 (ref 0.5–1.1)
Glucose: 235
Potassium: 3.4 mEq/L — AB (ref 3.5–5.1)
Sodium: 143 (ref 137–147)

## 2022-07-19 LAB — CBC AND DIFFERENTIAL
HCT: 40 (ref 36–46)
Hemoglobin: 13.6 (ref 12.0–16.0)
Neutrophils Absolute: 4.8
Platelets: 240 10*3/uL (ref 150–400)
WBC: 8.2

## 2022-07-19 LAB — HEPATIC FUNCTION PANEL
ALT: 19 U/L (ref 7–35)
AST: 22 (ref 13–35)
Alkaline Phosphatase: 80 (ref 25–125)
Bilirubin, Total: 0.6

## 2022-07-19 LAB — COMPREHENSIVE METABOLIC PANEL
Albumin: 4.2 (ref 3.5–5.0)
Calcium: 9.4 (ref 8.7–10.7)
Globulin: 1.7
eGFR: 72

## 2022-07-19 LAB — CBC: RBC: 4.24 (ref 3.87–5.11)

## 2022-07-22 ENCOUNTER — Other Ambulatory Visit: Payer: Self-pay | Admitting: Family Medicine

## 2022-07-22 ENCOUNTER — Telehealth: Payer: Self-pay | Admitting: Family Medicine

## 2022-07-22 ENCOUNTER — Ambulatory Visit: Payer: Medicare Other | Admitting: Family Medicine

## 2022-07-22 DIAGNOSIS — I10 Essential (primary) hypertension: Secondary | ICD-10-CM

## 2022-07-22 MED ORDER — OLMESARTAN MEDOXOMIL 20 MG PO TABS: 20.0000 mg | ORAL_TABLET | Freq: Every day | ORAL | 0 refills | Status: DC

## 2022-07-22 NOTE — Telephone Encounter (Signed)
PT called saying she just needs the blood pressure medication in today.

## 2022-07-22 NOTE — Telephone Encounter (Signed)
PT went to the surgeon and her BP is 150/100 and she has been out of her meds and has an appt with the dr on wed of this week. [facility] [address]. She will get the others refilled at her appt on wed but just needs this one since bp is so high.

## 2022-07-23 NOTE — Progress Notes (Unsigned)
Name: Mary Cantrell   MRN: [id number]    DOB: 1945-11-19   Date:07/24/2022       Progress Note  Subjective  Chief Complaint  Follow Up  HPI  DMII: last A1C was 7.1% it went to 7.8%,7.5 %  down to  6.9 %  up to 7.6 % it went down to 6.1 % and today is up to 8.8% - husband states she has been eating cream cycles - multiple of those a day. She has gained 18 lbs since August 2023. She is also under more stress and in pain due to arthritis right shoulder. She has polydipsia , but no polyphagia or polyuria - but she has nocturia. She is up to date with eye exam  She was taking Metformin 2  tablets every morning but it was recently stopped by Ortho ( explained he likely said to stop the day of surgery )  also glipizide 7.5 mg  She denies hypoglycemic episodes. She has associated obesity, dyslipidemia, chronic kidney disease, last urine micro was normal. LDL improved but not quite at goal. Discussed changing to [facility] but she does not want to change medications at this time. Explained A1C must be below 8 % prior to surgery, needs to return for Fructosamine prior to surgery. Husband states they will stop getting the popscicles    OSA: on CPAP : she is compliant , goes to bed wearing it but sometimes removes during the night,  she denies day time somnolence or headaches.    Hyperlipidemia: reviewed labs done by Endo 04/19/2019 showed LDL 87, HDL 60.4. She was on Pravastatin , LDL goal is below 70. We tried to adjust dose but she could not tolerate the higher dose, we switched to Crestor but unable to tolerate it, she is back on Pravastatin 40 mg and last LDL was 83   Gout: diagnosed by Dr. Kathi Ludwig and is doing okay at this time She had labs done yesterday   Mild Intermittent Asthma: under the care of Dr. Meredeth Ide , she is on Symbicort and albuterol prn, she has stable SOB denies wheezing or cough at this time.    Hypertension : BP is out of control, she was out of medication two days this week but even today  bp is high, she has pain due to left shoulder arthritis. We will adjust dose of Benicar to 40 mg daily. No chest pain or palpitation    Psoriasis: on her elbows, uses topical medication prn, no lesions at this time.Unchanged    PMR: still under the care of Rheumatologist, Dr. Kathi Ludwig  She also has OA also psoriatic arthritis and is on methotrexate , she states unable to go down on the dose because pain increases, she also takes prednisone 5 mg daily to control symptoms  She continues to have left hip pain and now dealing with severe left shoulder pain and is ready to have surgery, explained to her she must get glucose under control and also bp prior to surgery   IBS: taking medication for pain and seems to control symptoms , she has more diarrhea when it is present    Tremors  she is under the care of Dr. Malvin Johns, she has tremors, balanced problems, sensory ataxia  she stopped taking Gabapentin because it caused fatigue. She states tremors are stable, uses a cane to help with gait. Stable.   Claudication: she states no symptoms since not as active lately due to right hip pain. Discussed activity to help with circulation. Discussed  activity again   Patient Active Problem List   Diagnosis Date Noted   Mild intermittent asthma without complication 24/40/1027   Hypertension, benign 08/22/2021   Long term (current) use of systemic steroids 08/22/2021   Lumbar spondylosis 08/22/2021   Diabetes mellitus without complication ([place]) 25/36/6440   Osteoarthritis 08/22/2021   DDD (degenerative disc disease), lumbar 10/24/2020   Neuropathy 02/27/2020   Sensory ataxia 02/27/2020   Tremor 02/27/2020   Chronic kidney disease, stage III (moderate) ([place]) 12/09/2018   Dyslipidemia associated with type 2 diabetes mellitus ([place]) 12/09/2018   Obesity (BMI 30.0-34.9) 07/17/2018   Hypertension associated with stage 2 chronic kidney disease due to type 2 diabetes mellitus ([place]) 03/15/2018   Psoriatic arthritis ([place])  12/04/2017   Stenosis of left carotid artery 12/16/2016   Polymyalgia rheumatica ([place]) 04/13/2015   Benign hypertension with chronic kidney disease, stage III ([place]) 12/21/2014   Back pain, chronic 12/21/2014   Narrowing of intervertebral disc space 12/21/2014   Diabetic gastroparesis ([place]) 12/21/2014   Amaurosis fugax of left eye 12/21/2014   H/O: hypothyroidism 12/21/2014   Hyperlipidemia 12/21/2014   IBS (irritable bowel syndrome) 12/21/2014   Disorder of macula of retina 12/21/2014   Asthma, moderate 12/21/2014   NASH (nonalcoholic steatohepatitis) 12/21/2014   NS (nuclear sclerosis) 12/21/2014   Osteoarthrosis 12/21/2014   Psoriasis 12/21/2014   Knee torn cartilage 12/21/2014   Diabetes mellitus type 2 in obese ([place]) 12/21/2014   Obstructive apnea 01/03/2014   History of shoulder surgery 04/23/2011    Past Surgical History:  Procedure Laterality Date   ABDOMINAL HYSTERECTOMY     APPENDECTOMY     ARTERY BIOPSY N/A 02/23/2015   Procedure: BIOPSY TEMPORAL ARTERY;  Surgeon: Algernon Huxley, MD;  Location: [facility];  Service: Vascular;  Laterality: N/A;   BREAST BIOPSY Left    1990's   CATARACT EXTRACTION W/PHACO Left 07/24/2021   Procedure: CATARACT EXTRACTION PHACO AND INTRAOCULAR LENS PLACEMENT ([place]) LEFT DIABETIC 8.37 00:50.0;  Surgeon: Birder Robson, MD;  Location: [employer] [facility];  Service: Ophthalmology;  Laterality: Left;  Diabetic   CATARACT EXTRACTION W/PHACO Right 08/07/2021   Procedure: CATARACT EXTRACTION PHACO AND INTRAOCULAR LENS PLACEMENT ([facility]) RIGHT DIABETIC 7.29 00:48.4;  Surgeon: Birder Robson, MD;  Location: [place];  Service: Ophthalmology;  Laterality: Right;  Diabetic   EXCISION / CURETTAGE BONE CYST FINGER     Left Index Finger   PILONIDAL CYST EXCISION     SHOULDER ARTHROSCOPY Right [id number]   Dr. Mauri Pole   STOMACH SURGERY  1941   3 arteries leaking in stomach after appendectomy   TONSILLECTOMY      Family History  Problem Relation  Age of Onset   Rheum arthritis Father    Diabetes Father    Pneumonia Father    Pneumonia Mother    Diabetes Sister    Osteoarthritis Sister    Breast cancer Neg Hx     Social History   Tobacco Use   Smoking status: Never   Smokeless tobacco: Never  Substance Use Topics   Alcohol use: No    Alcohol/week: 0.0 standard drinks of alcohol     Current Outpatient Medications:    albuterol (VENTOLIN HFA) 108 (90 Base) MCG/ACT inhaler, 2 puffs as needed, Disp: , Rfl:    aspirin 81 MG EC tablet, Take 1 tablet by mouth in the morning, at noon, and at bedtime., Disp: , Rfl:    calcipotriene-betamethasone (TACLONEX) ointment, Apply 1 application topically daily., Disp: , Rfl:  Calcium Carbonate-Vit D-Min ([facility] CALCIUM 600 + D/MINERALS) 600-400 MG-UNIT TABS, Take 1 tablet by mouth daily., Disp: , Rfl: 1   cholecalciferol (VITAMIN D3) 25 MCG (1000 UNIT) tablet, Take 1,000 Units by mouth daily., Disp: , Rfl:    Cinnamon 500 MG capsule, Take 1,000 mg by mouth daily. , Disp: , Rfl:    cyanocobalamin 100 MCG tablet, Take 100 mcg by mouth 2 (two) times a week. , Disp: , Rfl:    fluticasone (FLONASE) 50 MCG/ACT nasal spray, USE 2 SPRAYS IN EACH NOSTRIL AT BEDTIME, Disp: 48 g, Rfl: 3   folic acid (FOLVITE) 1 MG tablet, Take 1 mg by mouth daily. , Disp: , Rfl:    glucose blood (ONE TOUCH ULTRA TEST) test strip, 1 each by Other route daily. Check fsbs once a day  E11.69, E66.9, Disp: 100 each, Rfl: 2   ipratropium (ATROVENT) 0.03 % nasal spray, Place 2 sprays into both nostrils every 12 (twelve) hours., Disp: 90 mL, Rfl: 1   methotrexate (RHEUMATREX) 2.5 MG tablet, Take 20 mg by mouth once a week. Caution:Chemotherapy. Protect from light., Disp: , Rfl:    montelukast (SINGULAIR) 10 MG tablet, Take 1 tablet by mouth at bedtime., Disp: , Rfl:    Multiple Vitamins-Minerals (MULTIVITAMIN ADULTS PO), Take 1 tablet by mouth daily. , Disp: , Rfl:    predniSONE (DELTASONE) 5 MG tablet, Take 5 mg by mouth  daily., Disp: , Rfl:    Propylene Glycol (SYSTANE BALANCE OP), Apply to eye. Only as needed, Disp: , Rfl:    vitamin C (ASCORBIC ACID) 500 MG tablet, Take 1 tablet by mouth at bedtime. , Disp: , Rfl:    budesonide-formoterol (SYMBICORT) 160-4.5 MCG/ACT inhaler, Inhale 2 puffs into the lungs every morning., Disp: , Rfl:    dicyclomine (BENTYL) 20 MG tablet, Take 1 tablet (20 mg total) by mouth 3 (three) times daily as needed for spasms., Disp: 360 tablet, Rfl: 0   glipiZIDE (GLUCOTROL XL) 2.5 MG 24 hr tablet, Take 1 tablet (2.5 mg total) by mouth every morning., Disp: 90 tablet, Rfl: 1   glipiZIDE (GLUCOTROL XL) 5 MG 24 hr tablet, Take 1 tablet (5 mg total) by mouth every morning., Disp: 90 tablet, Rfl: 1   metFORMIN (GLUCOPHAGE-XR) 750 MG 24 hr tablet, TAKE 2 TABLETS (1,500 MG TOTAL) DAILY WITH BREAKFAST, Disp: 180 tablet, Rfl: 1   olmesartan (BENICAR) 40 MG tablet, Take 1 tablet (40 mg total) by mouth daily. In place of losartan, Disp: 90 tablet, Rfl: 1   pravastatin (PRAVACHOL) 40 MG tablet, TAKE 1 TABLET DAILY  (AWAITING LABS OF NEXT VISIT FOR FURTHER REFILLS), Disp: 90 tablet, Rfl: 1  Allergies  Allergen Reactions   Doxycycline Other (See Comments)    Weakness, flu like symptoms   Codeine     Dizzy   Lantus  [Insulin Glargine]     made skin hot   Oxycodone     Dizziness   Rosuvastatin     Myalgia    Penicillin G Rash    And dizziness   Sitagliptin Rash   Sulfa Antibiotics Rash    Other reaction(s):   Red burning skin   Wound Dressing Adhesive Rash    Surgical tape adhesive    I personally reviewed active problem list, medication list, allergies, family history, social history, health maintenance with the patient/caregiver today.   ROS  Constitutional: Negative for fever , positive for  weight change.  Respiratory: Negative for cough and shortness of breath.  Cardiovascular: Negative for chest pain or palpitations.  Gastrointestinal: Negative for abdominal pain, no bowel  changes.  Musculoskeletal: positive for gait problem or joint swelling.  Skin: Negative for rash.  Neurological: Negative for dizziness or headache.  No other specific complaints in a complete review of systems (except as listed in HPI above).   Objective  Vitals:   07/24/22 1358 07/24/22 1402  BP: (!) 164/96 (!) 152/96  Pulse: 89   Resp: 16   Temp: 98 F (36.7 C)   TempSrc: Oral   SpO2: 96%   Weight: 186 lb 11.2 oz (84.7 kg)   Height: 5\' 6"  (1.676 m)     Body mass index is 30.13 kg/m.  Physical Exam  Constitutional: Patient appears well-developed and well-nourished. Obese  No distress.  HEENT: head atraumatic, normocephalic, pupils equal and reactive to light, neck supple Cardiovascular: Normal rate, regular rhythm and normal heart sounds.  No murmur heard. No BLE edema. Pulmonary/Chest: Effort normal and breath sounds normal. No respiratory distress. Abdominal: Soft.  There is no tenderness. Psychiatric: Patient has a normal mood and affect. behavior is normal. Judgment and thought content normal.  Muscular skeletal: using a sling   Recent Results (from the past 2160 hour(s))  Novel Coronavirus, NAA ([facility])     Status: None   Collection Time: 05/15/22 12:00 AM   Specimen: Nasopharyngeal(NP) swabs in vial transport medium   Nasopharynge  Previous  Result Value Ref Range   SARS-CoV-2, NAA Not Detected Not Detected    Comment: This nucleic acid amplification test was developed and its performance characteristics determined by [employer]. Nucleic acid amplification tests include RT-PCR and TMA. This test has not been FDA cleared or approved. This test has been authorized by FDA under an Emergency Use Authorization (EUA). This test is only authorized for the duration of time the declaration that circumstances exist justifying the authorization of the emergency use of in vitro diagnostic tests for detection of SARS-CoV-2 virus and/or diagnosis of COVID-19  infection under section 564(b)(1) of the Act, 21 U.S.C. 416SAY-3(K) (1), unless the authorization is terminated or revoked sooner. When diagnostic testing is negative, the possibility of a false negative result should be considered in the context of a patient's recent exposures and the presence of clinical signs and symptoms consistent with COVID-19. An individual without symptoms of COVID-19 and who is not shedding SARS-CoV-2 virus wo uld expect to have a negative (not detected) result in this assay.   POCT rapid strep A     Status: None   Collection Time: 05/15/22 10:33 AM  Result Value Ref Range   Rapid Strep A Screen Negative Negative  POCT Influenza A/B     Status: None   Collection Time: 05/15/22 10:34 AM  Result Value Ref Range   Influenza A, POC Negative Negative   Influenza B, POC Negative Negative  POCT HgB A1C     Status: Abnormal   Collection Time: 07/24/22  2:03 PM  Result Value Ref Range   Hemoglobin A1C 8.8 (A) 4.0 - 5.6 %   HbA1c POC (<> result, manual entry)     HbA1c, POC (prediabetic range)     HbA1c, POC (controlled diabetic range)      PHQ2/9:    07/24/2022    1:49 PM 05/28/2022    8:26 AM 05/15/2022   10:25 AM 01/28/2022    1:33 PM 11/23/2021    1:18 PM  Depression screen PHQ 2/9  Decreased Interest 0 0 0 2 0  Down,  Depressed, Hopeless 0 0 0 3 0  PHQ - 2 Score 0 0 0 5 0  Altered sleeping 0 0 0 0   Tired, decreased energy 0 0 0 3   Change in appetite 0 0 0 3   Feeling bad or failure about yourself  0 0 0 3   Trouble concentrating 0 0 0 3   Moving slowly or fidgety/restless 0 0 0 3   Suicidal thoughts 0 0 0 3   PHQ-9 Score 0 0 0 23   Difficult doing work/chores Not difficult at all Not difficult at all Not difficult at all Somewhat difficult     phq 9 is negative   Fall Risk:    07/24/2022    1:49 PM 05/28/2022    8:26 AM 05/15/2022   10:25 AM 01/28/2022    1:23 PM 11/23/2021    1:18 PM  Fall Risk   Falls in the past year? 0 0 0 0 0  Number falls  in past yr: 0 0 0  0  Injury with Fall? 0 0 0  0  Risk for fall due to : No Fall Risks No Fall Risks No Fall Risks No Fall Risks   Follow up Falls prevention discussed;Education provided;Falls evaluation completed Falls prevention discussed;Education provided;Falls evaluation completed Falls prevention discussed;Education provided;Falls evaluation completed Falls prevention discussed Falls prevention discussed     Assessment & Plan  1. Dyslipidemia associated with type 2 diabetes mellitus (HCC)  - POCT HgB A1C - pravastatin (PRAVACHOL) 40 MG tablet; TAKE 1 TABLET DAILY  (AWAITING LABS OF NEXT VISIT FOR FURTHER REFILLS)  Dispense: 90 tablet; Refill: 1  2. Diabetes mellitus type 2 in obese (HCC)  - glipiZIDE (GLUCOTROL XL) 5 MG 24 hr tablet; Take 1 tablet (5 mg total) by mouth every morning.  Dispense: 90 tablet; Refill: 1 - glipiZIDE (GLUCOTROL XL) 2.5 MG 24 hr tablet; Take 1 tablet (2.5 mg total) by mouth every morning.  Dispense: 90 tablet; Refill: 1 - metFORMIN (GLUCOPHAGE-XR) 750 MG 24 hr tablet; TAKE 2 TABLETS (1,500 MG TOTAL) DAILY WITH BREAKFAST  Dispense: 180 tablet; Refill: 1  3. Claudication ([place])  Needs to ride a bike  4. Hypertension, benign  - olmesartan (BENICAR) 40 MG tablet; Take 1 tablet (40 mg total) by mouth daily. In place of losartan  Dispense: 90 tablet; Refill: 1  5. Irritable bowel syndrome with diarrhea  - dicyclomine (BENTYL) 20 MG tablet; Take 1 tablet (20 mg total) by mouth 3 (three) times daily as needed for spasms.  Dispense: 360 tablet; Refill: 0  6. Mild intermittent asthma without complication  Keep follow up with Dr. Raul Del   7. Obstructive apnea  Continue CPAP  8. Primary osteoarthritis of left shoulder  Going to see ortho   9. Psoriatic arthritis ([place])  Under the care of Rheumatologist   10. Polymyalgia rheumatica (HCC)  Under the care of Rheumatologist   11. Diabetic gastroparesis ([place])   Stable

## 2022-07-24 ENCOUNTER — Ambulatory Visit (INDEPENDENT_AMBULATORY_CARE_PROVIDER_SITE_OTHER): Payer: Medicare Other | Admitting: Family Medicine

## 2022-07-24 ENCOUNTER — Encounter: Payer: Self-pay | Admitting: Family Medicine

## 2022-07-24 VITALS — BP 152/96 | HR 89 | Temp 98.0°F | Resp 16 | Ht 66.0 in | Wt 186.7 lb

## 2022-07-24 DIAGNOSIS — E785 Hyperlipidemia, unspecified: Secondary | ICD-10-CM

## 2022-07-24 DIAGNOSIS — K3184 Gastroparesis: Secondary | ICD-10-CM

## 2022-07-24 DIAGNOSIS — E1143 Type 2 diabetes mellitus with diabetic autonomic (poly)neuropathy: Secondary | ICD-10-CM

## 2022-07-24 DIAGNOSIS — I10 Essential (primary) hypertension: Secondary | ICD-10-CM

## 2022-07-24 DIAGNOSIS — K580 Irritable bowel syndrome with diarrhea: Secondary | ICD-10-CM

## 2022-07-24 DIAGNOSIS — I739 Peripheral vascular disease, unspecified: Secondary | ICD-10-CM

## 2022-07-24 DIAGNOSIS — E669 Obesity, unspecified: Secondary | ICD-10-CM

## 2022-07-24 DIAGNOSIS — G4733 Obstructive sleep apnea (adult) (pediatric): Secondary | ICD-10-CM

## 2022-07-24 DIAGNOSIS — M353 Polymyalgia rheumatica: Secondary | ICD-10-CM

## 2022-07-24 DIAGNOSIS — M19012 Primary osteoarthritis, left shoulder: Secondary | ICD-10-CM

## 2022-07-24 DIAGNOSIS — L4050 Arthropathic psoriasis, unspecified: Secondary | ICD-10-CM | POA: Diagnosis not present

## 2022-07-24 DIAGNOSIS — E1169 Type 2 diabetes mellitus with other specified complication: Secondary | ICD-10-CM | POA: Diagnosis not present

## 2022-07-24 DIAGNOSIS — J4520 Mild intermittent asthma, uncomplicated: Secondary | ICD-10-CM

## 2022-07-24 LAB — POCT GLYCOSYLATED HEMOGLOBIN (HGB A1C): Hemoglobin A1C: 8.8 % — AB (ref 4.0–5.6)

## 2022-07-24 MED ORDER — OLMESARTAN MEDOXOMIL 40 MG PO TABS: 40.0000 mg | ORAL_TABLET | Freq: Every day | ORAL | 1 refills | Status: DC

## 2022-07-24 MED ORDER — METFORMIN HCL ER 750 MG PO TB24
ORAL_TABLET | ORAL | 1 refills | Status: DC
Start: 2022-07-24 — End: 2022-07-25

## 2022-07-24 MED ORDER — GLIPIZIDE ER 2.5 MG PO TB24: 2.5000 mg | ORAL_TABLET | Freq: Every morning | ORAL | 1 refills | Status: DC

## 2022-07-24 MED ORDER — DICYCLOMINE HCL 20 MG PO TABS: 20.0000 mg | ORAL_TABLET | Freq: Three times a day (TID) | ORAL | 0 refills | Status: AC | PRN

## 2022-07-24 MED ORDER — PRAVASTATIN SODIUM 40 MG PO TABS: ORAL_TABLET | ORAL | 1 refills | Status: DC

## 2022-07-24 MED ORDER — GLIPIZIDE ER 5 MG PO TB24: 5.0000 mg | ORAL_TABLET | Freq: Every morning | ORAL | 1 refills | Status: DC

## 2022-07-25 ENCOUNTER — Encounter: Payer: Self-pay | Admitting: Family Medicine

## 2022-07-25 ENCOUNTER — Other Ambulatory Visit: Payer: Self-pay | Admitting: Family Medicine

## 2022-07-25 DIAGNOSIS — E1169 Type 2 diabetes mellitus with other specified complication: Secondary | ICD-10-CM

## 2022-07-25 MED ORDER — METFORMIN HCL ER 750 MG PO TB24: ORAL_TABLET | ORAL | 1 refills | Status: DC

## 2022-07-25 NOTE — Telephone Encounter (Signed)
Last note indicates [facility]  Requested Prescriptions  Pending Prescriptions Disp Refills   metFORMIN (GLUCOPHAGE-XR) 750 MG 24 hr tablet 180 tablet 1    Sig: TAKE 2 TABLETS (1,500 MG TOTAL) DAILY WITH BREAKFAST     Endocrinology:  Diabetes - Biguanides Failed - 07/25/2022 11:14 AM      Failed - HBA1C is between 0 and 7.9 and within 180 days    Hemoglobin A1C  Date Value Ref Range Status  07/24/2022 8.8 (A) 4.0 - 5.6 % Final  04/19/2019 7.3  Final   Hgb A1c MFr Bld  Date Value Ref Range Status  01/28/2022 6.1 (H) <5.7 % of total Hgb Final    Comment:    For someone without known diabetes, a hemoglobin  A1c value between 5.7% and 6.4% is consistent with prediabetes and should be confirmed with a  follow-up test. . For someone with known diabetes, a value <7% indicates that their diabetes is well controlled. A1c targets should be individualized based on duration of diabetes, age, comorbid conditions, and other considerations. . This assay result is consistent with an increased risk of diabetes. . Currently, no consensus exists regarding use of hemoglobin A1c for diagnosis of diabetes for children. .          Passed - Cr in normal range and within 360 days    Creat  Date Value Ref Range Status  01/28/2022 0.75 0.60 - 1.00 mg/dL Final   Creatinine, Urine  Date Value Ref Range Status  01/28/2022 69 20 - 275 mg/dL Final         Passed - eGFR in normal range and within 360 days    GFR, Est African American  Date Value Ref Range Status  05/31/2020 59 (L) > OR = 60 mL/min/1.27m2 Final   GFR, Est Non African American  Date Value Ref Range Status  05/31/2020 51 (L) > OR = 60 mL/min/1.5m2 Final   GFR, Estimated  Date Value Ref Range Status  12/13/2021 >60 >60 mL/min Final    Comment:    (NOTE) Calculated using the CKD-EPI Creatinine Equation  (2021)    eGFR  Date Value Ref Range Status  01/28/2022 82 > OR = 60 mL/min/1.49m2 Final         Passed - B12 Level in normal range and within 720 days    Vitamin B-12  Date Value Ref Range Status  01/28/2022 945 200 - 1,100 pg/mL Final         Passed - Valid encounter within last 6 months    Recent Outpatient Visits           Yesterday Dyslipidemia associated with type 2 diabetes mellitus [facility])   [facility] Steele Sizer, MD   1 month ago Cough, unspecified type   [facility] Mecum, Dani Gobble, PA-C   2 months ago Cough, unspecified type   [facility] Bo Merino, FNP   5 months ago Chest pain, mid sternal   [facility] Steele Sizer, MD   8 months ago Diabetes mellitus type 2 in obese [facility])   [facility] Teodora Medici, DO       Future Appointments             In 1 month Ancil Boozer, Drue Stager, MD [facility], [facility]  In 3 months Sowles, Drue Stager, MD [facility], [place] within normal limits and completed in the last 12 months    WBC  Date Value Ref Range Status  01/28/2022 9.2 3.8 - 10.8 Thousand/uL Final   RBC  Date Value Ref Range Status  01/28/2022 4.06 3.80 - 5.10 Million/uL Final   Hemoglobin  Date Value Ref Range Status  01/28/2022 13.0 11.7 - 15.5 g/dL Final  02/10/2015 13.7 11.1 - 15.9 g/dL Final   HCT  Date Value Ref Range Status  01/28/2022 37.3 35.0 - 45.0 % Final   Hematocrit  Date Value Ref Range Status  02/10/2015 40.0 34.0 - 46.6 % Final   MCHC  Date Value Ref Range Status  01/28/2022 34.9 32.0 - 36.0 g/dL Final   [facility]  Date Value Ref Range Status  01/28/2022 32.0 27.0 - 33.0 pg Final   MCV  Date Value Ref Range Status  01/28/2022 91.9 80.0 - 100.0 fL Final  02/10/2015 87 79 - 97 fL Final  12/31/2011 91 80 - 100 fL Final    No results found for: "PLTCOUNTKUC", "LABPLAT", "POCPLA" RDW  Date Value Ref Range Status  01/28/2022 13.5 11.0 - 15.0 % Final  02/10/2015 13.4 12.3 - 15.4 % Final  12/31/2011 13.3 11.5 - 14.5 % Final

## 2022-07-25 NOTE — Telephone Encounter (Signed)
Patient called in to have her prescription of metFORMIN (GLUCOPHAGE-XR) 750 MG 24 hr tablet  [facility] [address], [place], [place]. Prescription originally sent to [place], [place].

## 2022-07-29 ENCOUNTER — Encounter: Payer: Self-pay | Admitting: Family Medicine

## 2022-08-12 ENCOUNTER — Encounter: Payer: Self-pay | Admitting: Family Medicine

## 2022-08-13 ENCOUNTER — Other Ambulatory Visit: Payer: Self-pay | Admitting: Surgery

## 2022-08-13 DIAGNOSIS — M19012 Primary osteoarthritis, left shoulder: Secondary | ICD-10-CM

## 2022-08-13 DIAGNOSIS — M7582 Other shoulder lesions, left shoulder: Secondary | ICD-10-CM

## 2022-08-15 ENCOUNTER — Other Ambulatory Visit: Payer: Self-pay | Admitting: Surgery

## 2022-08-16 ENCOUNTER — Ambulatory Visit
Admission: RE | Admit: 2022-08-16 | Discharge: 2022-08-16 | Disposition: A | Discharge status: Home / Self Care | Payer: Medicare Other | Source: Ambulatory Visit | Attending: Surgery | Admitting: Surgery

## 2022-08-16 DIAGNOSIS — M19012 Primary osteoarthritis, left shoulder: Secondary | ICD-10-CM | POA: Diagnosis present

## 2022-08-16 DIAGNOSIS — M7582 Other shoulder lesions, left shoulder: Secondary | ICD-10-CM | POA: Insufficient documentation

## 2022-08-19 ENCOUNTER — Other Ambulatory Visit: Payer: Self-pay | Admitting: Internal Medicine

## 2022-08-19 ENCOUNTER — Encounter
Admission: RE | Admit: 2022-08-19 | Discharge: 2022-08-19 | Disposition: A | Discharge status: Home / Self Care | Payer: Medicare Other | Source: Ambulatory Visit | Attending: Surgery | Admitting: Surgery

## 2022-08-19 ENCOUNTER — Other Ambulatory Visit: Payer: Self-pay

## 2022-08-19 VITALS — BP 175/95 | HR 79 | Resp 16 | Ht 66.5 in | Wt 182.0 lb

## 2022-08-19 DIAGNOSIS — Z79899 Other long term (current) drug therapy: Secondary | ICD-10-CM | POA: Insufficient documentation

## 2022-08-19 DIAGNOSIS — I10 Essential (primary) hypertension: Secondary | ICD-10-CM

## 2022-08-19 DIAGNOSIS — E119 Type 2 diabetes mellitus without complications: Secondary | ICD-10-CM | POA: Diagnosis not present

## 2022-08-19 DIAGNOSIS — Z01818 Encounter for other preprocedural examination: Secondary | ICD-10-CM | POA: Insufficient documentation

## 2022-08-19 DIAGNOSIS — Z01810 Encounter for preprocedural cardiovascular examination: Secondary | ICD-10-CM

## 2022-08-19 LAB — TYPE AND SCREEN
ABO/RH(D): O POS
Antibody Screen: POSITIVE

## 2022-08-19 LAB — URINALYSIS, ROUTINE W REFLEX MICROSCOPIC
Bacteria, UA: NONE SEEN
Bilirubin Urine: NEGATIVE
Glucose, UA: NEGATIVE mg/dL
Hgb urine dipstick: NEGATIVE
Ketones, ur: NEGATIVE mg/dL
Nitrite: NEGATIVE
Protein, ur: NEGATIVE mg/dL
Specific Gravity, Urine: 1.01 (ref 1.005–1.030)
pH: 5 (ref 5.0–8.0)

## 2022-08-19 LAB — SURGICAL PCR SCREEN
MRSA, PCR: NEGATIVE
Staphylococcus aureus: NEGATIVE

## 2022-08-19 NOTE — Patient Instructions (Addendum)
Your procedure is scheduled on: Tuesday 08/27/22 Report to the Registration Desk on the 1st floor of the [facility]. To find out your arrival time, please call [phone] between 1PM - 3PM on: Monday 08/26/22 If your arrival time is 6:00 am, do not arrive before that time as the [facility] entrance doors do not open until 6:00 am.  REMEMBER: Instructions that are not followed completely may result in serious medical risk, up to and including death; or upon the discretion of your surgeon and anesthesiologist your surgery may need to be rescheduled.  Do not eat food after midnight the night before surgery.  No gum chewing or hard candies.  You may however, drink CLEAR liquids up to 2 hours before you are scheduled to arrive for your surgery. Do not drink anything within 2 hours of your scheduled arrival time.  Clear liquids include: - water   Do NOT drink anything that is not on this list.  Type 1 and Type 2 diabetics should only drink water.  In addition, your doctor has ordered for you to drink the provided:   Gatorade G2 Drinking this carbohydrate drink up to two hours before surgery helps to reduce insulin resistance and improve patient outcomes. Please complete drinking 2 hours before scheduled arrival time.  One week prior to surgery: Stop Anti-inflammatories (NSAIDS) such as Advil, Aleve, Ibuprofen, Motrin, Naproxen, Naprosyn and Aspirin based products such as Excedrin, Goody's Powder, BC Powder. You may however, continue to take Tylenol if needed for pain up until the day of surgery.  Stop ANY OVER THE COUNTER supplements until after surgery.  Continue taking all prescribed medications with the exception of the following: Metformin  Last dose of Metformin will be on Saturday 08/24/22  Follow recommendations from Cardiologist or PCP regarding stopping blood thinners.  TAKE ONLY THESE MEDICATIONS THE MORNING OF SURGERY WITH A SIP OF WATER:  predniSONE (DELTASONE)  tablet    Use inhalers on the day of surgery and bring to the hospital.  No Alcohol for 24 hours before or after surgery.  No Smoking including e-cigarettes for 24 hours before surgery.  No chewable tobacco products for at least 6 hours before surgery.  No nicotine patches on the day of surgery.  Do not use any "recreational" drugs for at least a week (preferably 2 weeks) before your surgery.  Please be advised that the combination of cocaine and anesthesia may have negative outcomes, up to and including death. If you test positive for cocaine, your surgery will be cancelled.  On the morning of surgery brush your teeth with toothpaste and water, you may rinse your mouth with mouthwash if you wish. Do not swallow any toothpaste or mouthwash.  Use CHG Soap or wipes as directed on instruction sheet.  Do not wear jewelry, make-up, hairpins, clips or nail polish.  Do not wear lotions, powders, or perfumes. NO DEODORANT  Do not shave body hair from the neck down 48 hours before surgery.  Contact lenses, hearing aids and dentures may not be worn into surgery.  Do not bring valuables to the hospital. [facility] is not responsible for any missing/lost belongings or valuables.   Total Shoulder Arthroplasty:  use Benzoyl Peroxide 5% Gel as directed on instruction sheet.  Bring your C-PAP to the hospital in case you may have to spend the night.   Notify your doctor if there is any change in your medical condition (cold, fever, infection).  Wear comfortable clothing (specific to your surgery type)  to the hospital.  After surgery, you can help prevent lung complications by doing breathing exercises.  Take deep breaths and cough every 1-2 hours. Your doctor may order a device called an Incentive Spirometer to help you take deep breaths. When coughing or sneezing, hold a pillow firmly against your incision with both hands. This is called "splinting." Doing this helps protect your incision. It also  decreases belly discomfort.  If you are being admitted to the hospital overnight, leave your suitcase in the car. After surgery it may be brought to your room.  In case of increased patient census, it may be necessary for you, the patient, to continue your postoperative care in the Same Day Surgery department.  If you are being discharged the day of surgery, you will not be allowed to drive home. You will need a responsible individual to drive you home and stay with you for 24 hours after surgery.   If you are taking public transportation, you will need to have a responsible individual with you.  Please call the [place] Dept. at [phone] if you have any questions about these instructions.  Surgery Visitation Policy:  Patients undergoing a surgery or procedure may have two family members or support persons with them as long as the person is not COVID-19 positive or experiencing its symptoms.   Inpatient Visitation:    Visiting hours are 7 a.m. to 8 p.m. Up to four visitors are allowed at one time in a patient room. The visitors may rotate out with other people during the day. One designated support person (adult) may remain overnight.  Due to an increase in RSV and influenza rates and associated hospitalizations, children ages 37 and under will not be able to visit patients in [facility]. Masks continue to be strongly recommended.     Preparing for Surgery with CHLORHEXIDINE GLUCONATE (CHG) Soap  Chlorhexidine Gluconate (CHG) Soap  o An antiseptic cleaner that kills germs and bonds with the skin to continue killing germs even after washing  o Used for showering the night before surgery and morning of surgery  Before surgery, you can play an important role by reducing the number of germs on your skin.  CHG (Chlorhexidine gluconate) soap is an antiseptic cleanser which kills germs and bonds with the skin to continue killing germs even after  washing.  Please do not use if you have an allergy to CHG or antibacterial soaps. If your skin becomes reddened/irritated stop using the CHG.  1. Shower the NIGHT BEFORE SURGERY and the MORNING OF SURGERY with CHG soap.  2. If you choose to wash your hair, wash your hair first as usual with your normal shampoo.  3. After shampooing, rinse your hair and body thoroughly to remove the shampoo.  4. Use CHG as you would any other liquid soap. You can apply CHG directly to the skin and wash gently with a scrungie or a clean washcloth.  5. Apply the CHG soap to your body only from the neck down. Do not use on open wounds or open sores. Avoid contact with your eyes, ears, mouth, and genitals (private parts). Wash face and genitals (private parts) with your normal soap.  6. Wash thoroughly, paying special attention to the area where your surgery will be performed.  7. Thoroughly rinse your body with warm water.  8. Do not shower/wash with your normal soap after using and rinsing off the CHG soap.  9. Pat yourself dry with a clean towel.  10. Wear clean pajamas to bed the night before surgery.  12. Place clean sheets on your bed the night of your first shower and do not sleep with pets.  13. Shower again with the CHG soap on the day of surgery prior to arriving at the hospital.  14. Do not apply any deodorants/lotions/powders.  15. Please wear clean clothes to the hospital.   Preparing for Total Shoulder Arthroplasty  Before surgery, you can play an important role by reducing the number of germs on your skin by using the following products:  Benzoyl Peroxide Gel  o Reduces the number of germs present on the skin  o Applied twice a day to shoulder area starting two days before surgery  Chlorhexidine Gluconate (CHG) Soap  o An antiseptic cleaner that kills germs and bonds with the skin to continue killing germs even after washing  o Used for showering the night before surgery and  morning of surgery  BENZOYL PEROXIDE 5% GEL  Please do not use if you have an allergy to benzoyl peroxide. If your skin becomes reddened/irritated stop using the benzoyl peroxide.  Starting two days before surgery, apply as follows:  1. Apply benzoyl peroxide in the morning and at night. Apply after taking a shower. If you are not taking a shower, clean entire shoulder front, back, and side along with the armpit with a clean wet washcloth.  2. Place a quarter-sized dollop on your shoulder and rub in thoroughly, making sure to cover the front, back, and side of your shoulder, along with the armpit.  2 days before ____ AM ____ PM 1 day before ____ AM ____ PM  sUNDAY AND mONDAY  3. Do this twice a day for two days. (Last application is the night before surgery, AFTER using the CHG soap).  4. Do NOT apply benzoyl peroxide gel on the day of surgery.  How to Use an Incentive Spirometer  An incentive spirometer is a tool that measures how well you are filling your lungs with each breath. Learning to take long, deep breaths using this tool can help you keep your lungs clear and active. This may help to reverse or lessen your chance of developing breathing (pulmonary) problems, especially infection. You may be asked to use a spirometer: After a surgery. If you have a lung problem or a history of smoking. After a long period of time when you have been unable to move or be active. If the spirometer includes an indicator to show the highest number that you have reached, your health care provider or respiratory therapist will help you set a goal. Keep a log of your progress as told by your health care provider. What are the risks? Breathing too quickly may cause dizziness or cause you to pass out. Take your time so you do not get dizzy or light-headed. If you are in pain, you may need to take pain medicine before doing incentive spirometry. It is harder to take a deep breath if you are having  pain. How to use your incentive spirometer  Sit up on the edge of your bed or on a chair. Hold the incentive spirometer so that it is in an upright position. Before you use the spirometer, breathe out normally. Place the mouthpiece in your mouth. Make sure your lips are closed tightly around it. Breathe in slowly and as deeply as you can through your mouth, causing the piston or the ball to rise toward the top of the chamber. Hold your  breath for 3-5 seconds, or for as long as possible. If the spirometer includes a coach indicator, use this to guide you in breathing. Slow down your breathing if the indicator goes above the marked areas. Remove the mouthpiece from your mouth and breathe out normally. The piston or ball will return to the bottom of the chamber. Rest for a few seconds, then repeat the steps 10 or more times. Take your time and take a few normal breaths between deep breaths so that you do not get dizzy or light-headed. Do this every 1-2 hours when you are awake. If the spirometer includes a goal marker to show the highest number you have reached (best effort), use this as a goal to work toward during each repetition. After each set of 10 deep breaths, cough a few times. This will help to make sure that your lungs are clear. If you have an incision on your chest or abdomen from surgery, place a pillow or a rolled-up towel firmly against the incision when you cough. This can help to reduce pain while taking deep breaths and coughing. General tips When you are able to get out of bed: Walk around often. Continue to take deep breaths and cough in order to clear your lungs. Keep using the incentive spirometer until your health care provider says it is okay to stop using it. If you have been in the hospital, you may be told to keep using the spirometer at home. Contact a health care provider if: You are having difficulty using the spirometer. You have trouble using the spirometer as  often as instructed. Your pain medicine is not giving enough relief for you to use the spirometer as told. You have a fever. Get help right away if: You develop shortness of breath. You develop a cough with bloody mucus from the lungs. You have fluid or blood coming from an incision site after you cough. Summary An incentive spirometer is a tool that can help you learn to take long, deep breaths to keep your lungs clear and active. You may be asked to use a spirometer after a surgery, if you have a lung problem or a history of smoking, or if you have been inactive for a long period of time. Use your incentive spirometer as instructed every 1-2 hours while you are awake. If you have an incision on your chest or abdomen, place a pillow or a rolled-up towel firmly against your incision when you cough. This will help to reduce pain. Get help right away if you have shortness of breath, you cough up bloody mucus, or blood comes from your incision when you cough. This information is not intended to replace advice given to you by your health care provider. Make sure you discuss any questions you have with your health care provider. Document Revised: 08/30/2019 Document Reviewed: 08/30/2019 Elsevier Patient Education  [place].

## 2022-08-20 NOTE — Telephone Encounter (Signed)
Unable to refill per protocol, Rx expired. Discontinued 01/28/22, patient discharged.  Requested Prescriptions  Pending Prescriptions Disp Refills   spironolactone-hydrochlorothiazide (ALDACTAZIDE) 25-25 MG tablet [Pharmacy Med Name: SPIRONOLACTONE/HCTZ TABS 25/'25MG'$ ] 45 tablet 3    Sig: TAKE ONE-HALF (1/2) TABLET DAILY     Cardiovascular: Diuretic Combos Failed - 08/19/2022  1:44 AM      Failed - K in normal range and within 180 days    Potassium  Date Value Ref Range Status  07/19/2022 3.4 (A) 3.5 - 5.1 mEq/L Final  12/31/2011 3.5 3.5 - 5.1 mmol/L Final         Failed - Last BP in normal range    BP Readings from Last 1 Encounters:  08/19/22 (!) 175/95         Passed - Na in normal range and within 180 days    Sodium  Date Value Ref Range Status  07/19/2022 143 137 - 147 Final  12/31/2011 139 136 - 145 mmol/L Final         Passed - Cr in normal range and within 180 days    Creatinine  Date Value Ref Range Status  07/19/2022 0.8 0.5 - 1.1 Final   Creat  Date Value Ref Range Status  01/28/2022 0.75 0.60 - 1.00 mg/dL Final   Creatinine, Urine  Date Value Ref Range Status  01/28/2022 69 20 - 275 mg/dL Final         Passed - Valid encounter within last 6 months    Recent Outpatient Visits           3 weeks ago Dyslipidemia associated with type 2 diabetes mellitus ([place])   [facility] Steele Sizer, MD   2 months ago Cough, unspecified type   [facility] Mecum, Dani Gobble, PA-C   3 months ago Cough, unspecified type   [place], FNP   6 months ago Chest pain, mid sternal   [facility] Steele Sizer, MD   9 months ago Diabetes mellitus type 2 in obese [facility])   [facility] Teodora Medici, DO       Future Appointments             In 1 month Ancil Boozer, Drue Stager, MD [facility], [place]   In 2  months Steele Sizer, MD [facility], [facility]

## 2022-08-21 LAB — URINE CULTURE: Culture: <10000 — AB

## 2022-08-26 ENCOUNTER — Ambulatory Visit: Payer: Medicare Other | Admitting: Family Medicine

## 2022-08-27 ENCOUNTER — Ambulatory Visit: Payer: Medicare Other | Admitting: Urgent Care

## 2022-08-27 ENCOUNTER — Ambulatory Visit: Payer: Medicare Other

## 2022-08-27 ENCOUNTER — Encounter: Payer: Self-pay | Admitting: Surgery

## 2022-08-27 ENCOUNTER — Encounter
Admission: RE | Disposition: A | Discharge status: Home / Self Care | Payer: Self-pay | Source: Home / Self Care | Attending: Surgery

## 2022-08-27 ENCOUNTER — Other Ambulatory Visit: Payer: Self-pay

## 2022-08-27 ENCOUNTER — Ambulatory Visit
Admission: RE | Admit: 2022-08-27 | Discharge: 2022-08-27 | Disposition: A | Discharge status: Home / Self Care | Payer: Medicare Other | Attending: Surgery | Admitting: Surgery

## 2022-08-27 DIAGNOSIS — Z7984 Long term (current) use of oral hypoglycemic drugs: Secondary | ICD-10-CM | POA: Diagnosis not present

## 2022-08-27 DIAGNOSIS — G4730 Sleep apnea, unspecified: Secondary | ICD-10-CM | POA: Diagnosis not present

## 2022-08-27 DIAGNOSIS — E119 Type 2 diabetes mellitus without complications: Secondary | ICD-10-CM | POA: Diagnosis not present

## 2022-08-27 DIAGNOSIS — Z7982 Long term (current) use of aspirin: Secondary | ICD-10-CM | POA: Insufficient documentation

## 2022-08-27 DIAGNOSIS — E785 Hyperlipidemia, unspecified: Secondary | ICD-10-CM | POA: Diagnosis not present

## 2022-08-27 DIAGNOSIS — I10 Essential (primary) hypertension: Secondary | ICD-10-CM | POA: Insufficient documentation

## 2022-08-27 DIAGNOSIS — J449 Chronic obstructive pulmonary disease, unspecified: Secondary | ICD-10-CM | POA: Insufficient documentation

## 2022-08-27 DIAGNOSIS — Z01812 Encounter for preprocedural laboratory examination: Secondary | ICD-10-CM

## 2022-08-27 DIAGNOSIS — R350 Frequency of micturition: Secondary | ICD-10-CM

## 2022-08-27 DIAGNOSIS — M19012 Primary osteoarthritis, left shoulder: Secondary | ICD-10-CM | POA: Insufficient documentation

## 2022-08-27 DIAGNOSIS — R8290 Unspecified abnormal findings in urine: Secondary | ICD-10-CM

## 2022-08-27 HISTORY — PX: REVERSE SHOULDER ARTHROPLASTY: SHX5054

## 2022-08-27 LAB — GLUCOSE, CAPILLARY
Glucose-Capillary: 149 mg/dL — ABNORMAL HIGH (ref 70–99)
Glucose-Capillary: 187 mg/dL — ABNORMAL HIGH (ref 70–99)

## 2022-08-27 SURGERY — ARTHROPLASTY, SHOULDER, TOTAL, REVERSE
Anesthesia: General | Site: Shoulder | Laterality: Left

## 2022-08-27 MED ORDER — PHENYLEPHRINE 80 MCG/ML (10ML) SYRINGE FOR IV PUSH (FOR BLOOD PRESSURE SUPPORT)
PREFILLED_SYRINGE | INTRAVENOUS | Status: AC
  Filled 2022-08-27: qty 10

## 2022-08-27 MED ORDER — LIDOCAINE HCL (PF) 2 % IJ SOLN
INTRAMUSCULAR | Status: AC
  Filled 2022-08-27: qty 5

## 2022-08-27 MED ORDER — FAMOTIDINE 20 MG PO TABS: 20.0000 mg | ORAL_TABLET | Freq: Once | ORAL | Status: AC

## 2022-08-27 MED ORDER — KETOROLAC TROMETHAMINE 30 MG/ML IJ SOLN
INTRAMUSCULAR | Status: AC
  Filled 2022-08-27: qty 1

## 2022-08-27 MED ORDER — TRIAMCINOLONE ACETONIDE 40 MG/ML IJ SUSP
INTRAMUSCULAR | Status: AC
  Filled 2022-08-27: qty 2

## 2022-08-27 MED ORDER — FENTANYL CITRATE (PF) 100 MCG/2ML IJ SOLN
INTRAMUSCULAR | Status: AC
  Filled 2022-08-27: qty 2

## 2022-08-27 MED ORDER — BUPIVACAINE HCL (PF) 0.5 % IJ SOLN
INTRAMUSCULAR | Status: AC
  Filled 2022-08-27: qty 30

## 2022-08-27 MED ORDER — ONDANSETRON HCL 4 MG PO TABS: 4.0000 mg | ORAL_TABLET | Freq: Four times a day (QID) | ORAL | Status: DC | PRN

## 2022-08-27 MED ORDER — LIDOCAINE HCL (CARDIAC) PF 100 MG/5ML IV SOSY
PREFILLED_SYRINGE | INTRAVENOUS | Status: DC | PRN
  Administered 2022-08-27: 40 mg via INTRAVENOUS

## 2022-08-27 MED ORDER — SODIUM CHLORIDE 0.9 % IR SOLN
Status: DC | PRN
  Administered 2022-08-27: 3000 mL

## 2022-08-27 MED ORDER — BUPIVACAINE LIPOSOME 1.3 % IJ SUSP
INTRAMUSCULAR | Status: AC
  Filled 2022-08-27: qty 20

## 2022-08-27 MED ORDER — PHENYLEPHRINE HCL (PRESSORS) 10 MG/ML IV SOLN
INTRAVENOUS | Status: DC | PRN
  Administered 2022-08-27 (×6): 80 ug via INTRAVENOUS
  Administered 2022-08-27: 100 ug via INTRAVENOUS

## 2022-08-27 MED ORDER — DEXMEDETOMIDINE HCL IN NACL 80 MCG/20ML IV SOLN
INTRAVENOUS | Status: DC | PRN
  Administered 2022-08-27: 8 ug via BUCCAL
  Administered 2022-08-27 (×3): 4 ug via BUCCAL

## 2022-08-27 MED ORDER — PROPOFOL 1000 MG/100ML IV EMUL
INTRAVENOUS | Status: AC
  Filled 2022-08-27: qty 100

## 2022-08-27 MED ORDER — CEFAZOLIN SODIUM-DEXTROSE 2-4 GM/100ML-% IV SOLN
2.0000 g | INTRAVENOUS | Status: AC
  Administered 2022-08-27: 2 g via INTRAVENOUS

## 2022-08-27 MED ORDER — OXYCODONE HCL 5 MG/5ML PO SOLN: 5.0000 mg | Freq: Once | ORAL | Status: DC | PRN

## 2022-08-27 MED ORDER — SODIUM CHLORIDE FLUSH 0.9 % IV SOLN
INTRAVENOUS | Status: AC
  Filled 2022-08-27: qty 20

## 2022-08-27 MED ORDER — FAMOTIDINE 20 MG PO TABS
ORAL_TABLET | ORAL | Status: AC
  Administered 2022-08-27: 20 mg via ORAL
  Filled 2022-08-27: qty 1

## 2022-08-27 MED ORDER — GLYCOPYRROLATE 0.2 MG/ML IJ SOLN
INTRAMUSCULAR | Status: AC
  Filled 2022-08-27: qty 1

## 2022-08-27 MED ORDER — 0.9 % SODIUM CHLORIDE (POUR BTL) OPTIME
TOPICAL | Status: DC | PRN
  Administered 2022-08-27: 500 mL

## 2022-08-27 MED ORDER — ACETAMINOPHEN 10 MG/ML IV SOLN
INTRAVENOUS | Status: DC | PRN
  Administered 2022-08-27: 1000 mg via INTRAVENOUS

## 2022-08-27 MED ORDER — KETOROLAC TROMETHAMINE 15 MG/ML IJ SOLN: 15.0000 mg | Freq: Once | INTRAMUSCULAR | Status: AC

## 2022-08-27 MED ORDER — TRANEXAMIC ACID 1000 MG/10ML IV SOLN
INTRAVENOUS | Status: AC
  Filled 2022-08-27: qty 10

## 2022-08-27 MED ORDER — METOCLOPRAMIDE HCL 5 MG/ML IJ SOLN: 5.0000 mg | Freq: Three times a day (TID) | INTRAMUSCULAR | Status: DC | PRN

## 2022-08-27 MED ORDER — FENTANYL CITRATE (PF) 100 MCG/2ML IJ SOLN
INTRAMUSCULAR | Status: DC | PRN
  Administered 2022-08-27 (×3): 50 ug via INTRAVENOUS

## 2022-08-27 MED ORDER — TRIAMCINOLONE ACETONIDE 40 MG/ML IJ SUSP
INTRAMUSCULAR | Status: DC | PRN
  Administered 2022-08-27: 80 mg

## 2022-08-27 MED ORDER — CEFAZOLIN SODIUM-DEXTROSE 2-4 GM/100ML-% IV SOLN
INTRAVENOUS | Status: AC
  Filled 2022-08-27: qty 100

## 2022-08-27 MED ORDER — ACETAMINOPHEN 325 MG PO TABS: 325.0000 mg | ORAL_TABLET | Freq: Four times a day (QID) | ORAL | Status: DC | PRN

## 2022-08-27 MED ORDER — SUGAMMADEX SODIUM 200 MG/2ML IV SOLN
INTRAVENOUS | Status: DC | PRN
  Administered 2022-08-27: 170 mg via INTRAVENOUS

## 2022-08-27 MED ORDER — EPHEDRINE 5 MG/ML INJ
INTRAVENOUS | Status: AC
  Filled 2022-08-27: qty 5

## 2022-08-27 MED ORDER — OXYCODONE HCL 5 MG PO TABS: 5.0000 mg | ORAL_TABLET | Freq: Once | ORAL | Status: DC | PRN

## 2022-08-27 MED ORDER — ONDANSETRON HCL 4 MG/2ML IJ SOLN
INTRAMUSCULAR | Status: AC
  Filled 2022-08-27: qty 2

## 2022-08-27 MED ORDER — ROCURONIUM BROMIDE 100 MG/10ML IV SOLN
INTRAVENOUS | Status: DC | PRN
  Administered 2022-08-27: 50 mg via INTRAVENOUS

## 2022-08-27 MED ORDER — FENTANYL CITRATE (PF) 100 MCG/2ML IJ SOLN: 25.0000 ug | INTRAMUSCULAR | Status: DC | PRN

## 2022-08-27 MED ORDER — KETOROLAC TROMETHAMINE 15 MG/ML IJ SOLN
INTRAMUSCULAR | Status: AC
  Administered 2022-08-27: 15 mg via INTRAVENOUS
  Filled 2022-08-27: qty 1

## 2022-08-27 MED ORDER — PROPOFOL 10 MG/ML IV BOLUS
INTRAVENOUS | Status: AC
  Filled 2022-08-27: qty 20

## 2022-08-27 MED ORDER — CHLORHEXIDINE GLUCONATE 0.12 % MT SOLN: 15.0000 mL | Freq: Once | OROMUCOSAL | Status: AC

## 2022-08-27 MED ORDER — ORAL CARE MOUTH RINSE: 15.0000 mL | Freq: Once | OROMUCOSAL | Status: AC

## 2022-08-27 MED ORDER — SODIUM CHLORIDE 0.9 % IV SOLN: INTRAVENOUS | Status: DC

## 2022-08-27 MED ORDER — METOCLOPRAMIDE HCL 10 MG PO TABS: 5.0000 mg | ORAL_TABLET | Freq: Three times a day (TID) | ORAL | Status: DC | PRN

## 2022-08-27 MED ORDER — DEXAMETHASONE SODIUM PHOSPHATE 10 MG/ML IJ SOLN
INTRAMUSCULAR | Status: AC
  Filled 2022-08-27: qty 1

## 2022-08-27 MED ORDER — DEXAMETHASONE SODIUM PHOSPHATE 10 MG/ML IJ SOLN
INTRAMUSCULAR | Status: DC | PRN
  Administered 2022-08-27: 5 mg via INTRAVENOUS

## 2022-08-27 MED ORDER — CHLORHEXIDINE GLUCONATE 0.12 % MT SOLN
OROMUCOSAL | Status: AC
  Administered 2022-08-27: 15 mL via OROMUCOSAL
  Filled 2022-08-27: qty 15

## 2022-08-27 MED ORDER — GLYCOPYRROLATE 0.2 MG/ML IJ SOLN
INTRAMUSCULAR | Status: DC | PRN
  Administered 2022-08-27: .1 mg via INTRAVENOUS

## 2022-08-27 MED ORDER — KETOROLAC TROMETHAMINE 30 MG/ML IJ SOLN
INTRAMUSCULAR | Status: DC | PRN
  Administered 2022-08-27: 30 mg via INTRAVENOUS

## 2022-08-27 MED ORDER — ONDANSETRON HCL 4 MG/2ML IJ SOLN
INTRAMUSCULAR | Status: DC | PRN
  Administered 2022-08-27: 4 mg via INTRAVENOUS

## 2022-08-27 MED ORDER — ROCURONIUM BROMIDE 10 MG/ML (PF) SYRINGE
PREFILLED_SYRINGE | INTRAVENOUS | Status: AC
  Filled 2022-08-27: qty 10

## 2022-08-27 MED ORDER — BUPIVACAINE-EPINEPHRINE (PF) 0.5% -1:200000 IJ SOLN
INTRAMUSCULAR | Status: DC | PRN
  Administered 2022-08-27: 30 mL

## 2022-08-27 MED ORDER — SODIUM CHLORIDE 0.9 % IV SOLN
INTRAVENOUS | Status: DC | PRN
  Administered 2022-08-27: 30 mL

## 2022-08-27 MED ORDER — CEFAZOLIN SODIUM-DEXTROSE 2-4 GM/100ML-% IV SOLN
INTRAVENOUS | Status: AC
  Administered 2022-08-27: 2 g via INTRAVENOUS
  Filled 2022-08-27: qty 100

## 2022-08-27 MED ORDER — PROPOFOL 10 MG/ML IV BOLUS
INTRAVENOUS | Status: DC | PRN
  Administered 2022-08-27: 100 mg via INTRAVENOUS
  Administered 2022-08-27: 50 mg via INTRAVENOUS

## 2022-08-27 MED ORDER — EPINEPHRINE PF 1 MG/ML IJ SOLN
INTRAMUSCULAR | Status: AC
  Filled 2022-08-27: qty 1

## 2022-08-27 MED ORDER — CEFAZOLIN SODIUM-DEXTROSE 2-4 GM/100ML-% IV SOLN: 2.0000 g | Freq: Four times a day (QID) | INTRAVENOUS | Status: DC

## 2022-08-27 MED ORDER — ONDANSETRON HCL 4 MG/2ML IJ SOLN: 4.0000 mg | Freq: Four times a day (QID) | INTRAMUSCULAR | Status: DC | PRN

## 2022-08-27 MED ORDER — PRAVASTATIN SODIUM 40 MG PO TABS: 40.0000 mg | ORAL_TABLET | Freq: Every evening | ORAL | Status: DC

## 2022-08-27 MED ORDER — PHENYLEPHRINE HCL-NACL 20-0.9 MG/250ML-% IV SOLN
INTRAVENOUS | Status: DC | PRN
  Administered 2022-08-27: 26.667 ug/min via INTRAVENOUS

## 2022-08-27 SURGICAL SUPPLY — 69 items
APL PRP STRL LF DISP 70% ISPRP (MISCELLANEOUS) ×2
BASEPLATE SHLD REV 20 POST (Shoulder) IMPLANT
BEARING HUMERAL SHLDER 36M STD (Shoulder) IMPLANT
BIT DRILL CANN 6X20 (BIT) IMPLANT
BIT DRILL QUICK CONN 2.5 (BIT) IMPLANT
BLADE REAMER 1 REV SHOULDER (BLADE) IMPLANT
BLADE SAW SAG 25X90X1.19 (BLADE) ×1 IMPLANT
BRNG HUM STD 36 RVRS SHLDR (Shoulder) ×1 IMPLANT
BSPLAT GLND 20 POST STRL LF (Shoulder) ×1 IMPLANT
CHLORAPREP W/TINT 26 (MISCELLANEOUS) ×1 IMPLANT
COOLER POLAR GLACIER W/PUMP (MISCELLANEOUS) ×1 IMPLANT
COVER BACK TABLE REUSABLE LG (DRAPES) ×1 IMPLANT
DRAPE 3/4 80X56 (DRAPES) ×1 IMPLANT
DRAPE INCISE IOBAN 66X45 STRL (DRAPES) ×1 IMPLANT
DRSG OPSITE POSTOP 4X8 (GAUZE/BANDAGES/DRESSINGS) ×1 IMPLANT
ELECT BLADE 6.5 EXT (BLADE) IMPLANT
ELECT CAUTERY BLADE 6.4 (BLADE) ×1 IMPLANT
ELECT REM PT RETURN 9FT ADLT (ELECTROSURGICAL) ×1
ELECTRODE REM PT RTRN 9FT ADLT (ELECTROSURGICAL) ×1 IMPLANT
GAUZE XEROFORM 1X8 LF (GAUZE/BANDAGES/DRESSINGS) ×1 IMPLANT
GLENOSPHERE SHLD REV 36M +3 (Screw) IMPLANT
GLOVE BIO SURGEON STRL SZ7.5 (GLOVE) ×4 IMPLANT
GLOVE BIO SURGEON STRL SZ8 (GLOVE) ×4 IMPLANT
GLOVE BIOGEL PI IND STRL 8 (GLOVE) ×2 IMPLANT
GLOVE SURG UNDER LTX SZ8 (GLOVE) ×1 IMPLANT
GOWN STRL REUS W/ TWL LRG LVL3 (GOWN DISPOSABLE) ×1 IMPLANT
GOWN STRL REUS W/ TWL XL LVL3 (GOWN DISPOSABLE) ×1 IMPLANT
GOWN STRL REUS W/TWL LRG LVL3 (GOWN DISPOSABLE) ×1
GOWN STRL REUS W/TWL XL LVL3 (GOWN DISPOSABLE) ×1
HOOD PEEL AWAY T7 (MISCELLANEOUS) ×3 IMPLANT
IV NS IRRIG 3000ML ARTHROMATIC (IV SOLUTION) ×1 IMPLANT
KIT STABILIZATION SHOULDER (MISCELLANEOUS) ×1 IMPLANT
KIT TURNOVER KIT A (KITS) ×1 IMPLANT
MANIFOLD NEPTUNE II (INSTRUMENTS) ×1 IMPLANT
MASK FACE SPIDER DISP (MASK) ×1 IMPLANT
MAT ABSORB  FLUID 56X50 GRAY (MISCELLANEOUS) ×1
MAT ABSORB FLUID 56X50 GRAY (MISCELLANEOUS) ×1 IMPLANT
NDL MAYO CATGUT SZ1 (NEEDLE) IMPLANT
NDL SAFETY ECLIP 18X1.5 (MISCELLANEOUS) ×1 IMPLANT
NDL SPNL 20GX3.5 QUINCKE YW (NEEDLE) ×1 IMPLANT
NEEDLE MAYO CATGUT SZ1 (NEEDLE) IMPLANT
NEEDLE SPNL 20GX3.5 QUINCKE YW (NEEDLE) ×1 IMPLANT
NS IRRIG 500ML POUR BTL (IV SOLUTION) ×1 IMPLANT
PACK ARTHROSCOPY SHOULDER (MISCELLANEOUS) ×1 IMPLANT
PAD ARMBOARD 7.5X6 YLW CONV (MISCELLANEOUS) ×1 IMPLANT
PAD WRAPON POLAR SHDR UNIV (MISCELLANEOUS) ×1 IMPLANT
PIN BONE FIXATION TEMP 2.5 (PIN) IMPLANT
PULSAVAC PLUS IRRIG FAN TIP (DISPOSABLE) ×1
SCREW LOCK SHLD REV 4.5X18 (Screw) IMPLANT
SCREW LOCK SHLD REV 4.5X30 (Screw) IMPLANT
SHOULDER HUMERAL BEAR 36M STD (Shoulder) ×1 IMPLANT
SLING ULTRA II M (MISCELLANEOUS) IMPLANT
SPONGE T-LAP 18X18 ~~LOC~~+RFID (SPONGE) ×2 IMPLANT
STAPLER SKIN PROX 35W (STAPLE) ×1 IMPLANT
STEM HUM MICRO SZ 9 (Stem) IMPLANT
SUT ETHIBOND 0 MO6 C/R (SUTURE) ×1 IMPLANT
SUT FIBERWIRE #2 38 BLUE 1/2 (SUTURE) ×2
SUT VIC AB 0 CT1 36 (SUTURE) ×1 IMPLANT
SUT VIC AB 2-0 CT1 27 (SUTURE) ×2
SUT VIC AB 2-0 CT1 TAPERPNT 27 (SUTURE) ×2 IMPLANT
SUTURE FIBERWR #2 38 BLUE 1/2 (SUTURE) ×4 IMPLANT
SYR 10ML LL (SYRINGE) ×1 IMPLANT
SYR 30ML LL (SYRINGE) ×1 IMPLANT
SYR TOOMEY 50ML (SYRINGE) ×1 IMPLANT
TIP FAN IRRIG PULSAVAC PLUS (DISPOSABLE) ×1 IMPLANT
TRAP FLUID SMOKE EVACUATOR (MISCELLANEOUS) ×1 IMPLANT
TRAY HUMERAL NEUTRAL EXT 6 (Shoulder) IMPLANT
WATER STERILE IRR 500ML POUR (IV SOLUTION) ×1 IMPLANT
WRAPON POLAR PAD SHDR UNIV (MISCELLANEOUS) ×1

## 2022-08-27 NOTE — Anesthesia Postprocedure Evaluation (Signed)
Anesthesia Post Note  Patient: Mary Cantrell  Procedure(s) Performed: REVERSE SHOULDER ARTHROPLASTY WITH BICEPS TENODESIS.- RNFA (Left: Shoulder)  Patient location during evaluation: PACU Anesthesia Type: General Level of consciousness: awake and alert Pain management: pain level controlled Vital Signs Assessment: post-procedure vital signs reviewed and stable Respiratory status: spontaneous breathing, nonlabored ventilation, respiratory function stable and patient connected to nasal cannula oxygen Cardiovascular status: blood pressure returned to baseline and stable Postop Assessment: no apparent nausea or vomiting Anesthetic complications: no   No notable events documented.   Last Vitals:  Vitals:   08/27/22 1430 08/27/22 1446  BP: (!) 150/84 (!) 160/85  Pulse: 84 79  Resp:  18  Temp:  36.7 C  SpO2: 93% 94%    Last Pain:  Vitals:   08/27/22 1446  TempSrc: Temporal  PainSc: 0-No pain                 Precious Haws Katilyn Miltenberger

## 2022-08-27 NOTE — Anesthesia Preprocedure Evaluation (Signed)
Anesthesia Evaluation  Patient identified by MRN, date of birth, ID band Patient awake    Reviewed: Allergy & Precautions, NPO status , Patient's Chart, lab work & pertinent test results  History of Anesthesia Complications Negative for: history of anesthetic complications  Airway Mallampati: III  TM Distance: <3 FB Neck ROM: full    Dental  (+) Chipped   Pulmonary shortness of breath and with exertion, asthma , sleep apnea and Continuous Positive Airway Pressure Ventilation , COPD   Pulmonary exam normal        Cardiovascular Exercise Tolerance: Good hypertension, (-) angina (-) Past MI and (-) DOE Normal cardiovascular exam     Neuro/Psych  PSYCHIATRIC DISORDERS      negative neurological ROS     GI/Hepatic negative GI ROS, Neg liver ROS,,,  Endo/Other  diabetes, Type 2    Renal/GU Renal disease     Musculoskeletal   Abdominal   Peds  Hematology negative hematology ROS (+)   Anesthesia Other Findings Past Medical History: No date: Achilles tendinitis of right lower extremity No date: Allergy No date: Asthma     Comment:  Moderate No date: Chronic back pain No date: Depression No date: Diabetes mellitus without complication (HCC) No date: Gastroparesis     Comment:  DM No date: Hyperlipidemia No date: Hypertension No date: Hypoxemia No date: Irritable bowel syndrome No date: Maculopathy No date: NASH (nonalcoholic steatohepatitis) No date: Nuclear sclerosis of both eyes No date: OA (osteoarthritis)     Comment:  Hips No date: OSA (obstructive sleep apnea)     Comment:  CPAP No date: Post-void dribbling No date: Psoriasis No date: Regurgitation No date: SOB (shortness of breath) No date: Tear of left rotator cuff No date: TMJ (dislocation of temporomandibular joint) No date: Trochanteric bursitis of right hip No date: Wears hearing aid in both ears  Past Surgical History: No date: ABDOMINAL  HYSTERECTOMY No date: APPENDECTOMY 02/23/2015: ARTERY BIOPSY; N/A     Comment:  Procedure: BIOPSY TEMPORAL ARTERY;  Surgeon: Algernon Huxley, MD;  Location: [facility] OR[facility];  Service: Vascular;                Laterality: N/A; No date: BREAST BIOPSY; Left     Comment:  1990's 07/24/2021: CATARACT EXTRACTION W/PHACO; Left     Comment:  Procedure: CATARACT EXTRACTION PHACO AND INTRAOCULAR               LENS PLACEMENT (IOC) LEFT DIABETIC 8.37 00:50.0;                Surgeon: Birder Robson, MD;  Location: [facility];  Service: Ophthalmology;  Laterality: Left;                Diabetic 08/07/2021: CATARACT EXTRACTION W/PHACO; Right     Comment:  Procedure: CATARACT EXTRACTION PHACO AND INTRAOCULAR               LENS PLACEMENT (IOC) RIGHT DIABETIC 7.29 00:48.4;                Surgeon: Birder Robson, MD;  Location: [facility];  Service: Ophthalmology;  Laterality: Right;                Diabetic No date: EXCISION / CURETTAGE  BONE CYST FINGER     Comment:  Left Index Finger No date: PILONIDAL CYST EXCISION [id number]: SHOULDER ARTHROSCOPY; Right     Comment:  Dr. Mauri Pole 1942: STOMACH SURGERY     Comment:  3 arteries leaking in stomach after appendectomy No date: TONSILLECTOMY  BMI    Body Mass Index: 28.92 kg/m      Reproductive/Obstetrics negative OB ROS                             Anesthesia Physical Anesthesia Plan  ASA: 3  Anesthesia Plan: General ETT   Post-op Pain Management:    Induction: Intravenous  PONV Risk Score and Plan: Ondansetron, Dexamethasone, Midazolam and Treatment may vary due to age or medical condition  Airway Management Planned: Oral ETT  Additional Equipment:   Intra-op Plan:   Post-operative Plan: Extubation in OR  Informed Consent: I have reviewed the patients History and Physical, chart, labs and discussed the procedure including the risks, benefits and alternatives for  the proposed anesthesia with the patient or authorized representative who has indicated his/her understanding and acceptance.     Dental Advisory Given  Plan Discussed with: Anesthesiologist, CRNA and Surgeon  Anesthesia Plan Comments: (Patient consented for risks of anesthesia including but not limited to:  - adverse reactions to medications - damage to eyes, teeth, lips or other oral mucosa - nerve damage due to positioning  - sore throat or hoarseness - Damage to heart, brain, nerves, lungs, other parts of body or loss of life  Patient voiced understanding.)       Anesthesia Quick Evaluation

## 2022-08-27 NOTE — H&P (Signed)
History of Present Illness:  Mary Cantrell is a 77 y.o. female who presents today as a result of a referral from Mary Cantrell, [place], for left shoulder pain.   The patient's symptoms began several months ago and developed without any specific cause or injury . Initially, the patient saw Mary Smiles, PA-C, who referred her to Dr. Orion Cantrell for an articular injection under ultrasound guidance, but this provided no significant elief of her symptoms according to the patient. She followed up with GI Mary Blocker, PA, who referred the patient to me to discuss definitive treatment options. The patient describes the symptoms as marked (major pain with significant limitations) and have the quality of being aching, miserable, nagging, stabbing, tender, and throbbing. The pain is localized to the lateral arm/shoulder and localized to the anterior shoulder. These symptoms are aggravated constantly, with normal daily activities, with sleeping, at higher levels of activity, with overhead activity, reaching behind the back, getting dressed, and activity in general. She has tried non-steroidal anti-inflammatories (Advil) and oral steroids with limited benefit. She has tried rest , ice , and heat with limited benefit. She has tried the one injection described above with no significant relief of her symptoms. The patient denies any neck pain, nor does she note any numbness or paresthesias down the left arm. She is right-hand dominant.  This complaint is not work related. She is a sports non-participant.  Shoulder Surgical History:  The patient has had a rotator cuff repair and a decompression on her right shoulder in the past, but has had not had any prior surgery on her left shoulder.  PMH/PSH/Family History/Social History/Meds/Allergies:  I have reviewed past medical, surgical, social and family history, medications and allergies as documented in the EMR.  Current Outpatient Medications: albuterol 90 mcg/actuation inhaler Inhale 2  inhalations into the lungs every 6 (six) hours as needed for Wheezing 3 each 3  ascorbic acid (VITAMIN C) 500 MG tablet Take 500 mg by mouth daily.  aspirin 81 MG EC tablet Take 81 mg by mouth once daily  blood glucose diagnostic test strip Use 1 each 2 (two) times daily Use as instructed. 200 each 3  blood glucose meter kit Use as directed 1 each 0  budesonide-formoteroL (SYMBICORT) 160-4.5 mcg/actuation inhaler Inhale 2 inhalations into the lungs 2 (two) times daily 3 g 3  calcipotriene-betamethasone (TACLONEX) 0.005-0.064 % ointment Apply topically 3 (three) times a day.  cholecalciferol (VITAMIN D3) 1000 unit tablet Take 1 tablet by mouth once daily  cinnamon bark 500 mg capsule Take 500 mg by mouth daily.  clobetasoL (TEMOVATE) 0.05 % cream Apply topically 2 (two) times daily  cyanocobalamin (VITAMIN B12) 100 MCG tablet Take 100 mcg by mouth once daily  dicyclomine (BENTYL) 20 mg tablet Take 80 mg by mouth once daily.  diphenhydrAMINE (BENADRYL) 25 mg tablet Take 50 mg by mouth nightly  fluticasone (FLONASE) 50 mcg/actuation nasal spray Place 2 sprays into both nostrils daily.  folic acid (FOLVITE) 1 MG tablet Take 1 mg by mouth once daily  gabapentin (NEURONTIN) 300 MG capsule Take 300 mg twice a day for two weeks then increase to 300 mg three times a day and continue that dose 270 capsule 0  metFORMIN (GLUCOPHAGE-XR) 750 MG XR tablet Take 750 mg by mouth daily with breakfast  methotrexate (RHEUMATREX) 2.5 MG tablet Take 20 mg by mouth every 7 (seven) days.  montelukast (SINGULAIR) 10 mg tablet TAKE 1 TABLET NIGHTLY 90 tablet 1  olmesartan (BENICAR) 20 MG tablet Take 20  mg by mouth once daily In place of losartan  pravastatin (PRAVACHOL) 40 MG tablet Take 40 mg by mouth nightly.  predniSONE (DELTASONE) 5 MG tablet 5 mg 0  spironolactone-hydroCHLOROthiazide (ALDACTAZIDE) 25-25 mg tablet Take 1 tablet by mouth once daily  telmisartan (MICARDIS) 20 MG tablet Take 20 mg by mouth once daily   glipiZIDE (GLUCOTROL XL) 5 MG XL tablet Take 1 tablet (5 mg total) by mouth once daily 90 tablet 3  glipiZIDE (GLUCOTROL) 2.5 MG XL tablet Take 1 tablet (2.5 mg total) by mouth once daily 90 tablet 3  lancets Use 1 each 3 (three) times daily Use as instructed. 100 each 12  predniSONE (DELTASONE) 2.5 MG tablet TAKE 1 TABLET BY MOUTH WITH FOOD OR MILK ONCE A DAY (Patient not taking: Reported on 08/09/2022) 0   Allergies:  Doxycycline Other (Weakness, flu like symptoms)  Adhesive Rash  Surgical tape adhesive  Codeine Other (Dizzy) Gabapentin Unknown  Insulin Glargine (Rash, made skin hot) Metformin Other (Coated metformin is tolerated well) Penicillin Unknown  Penicillins Other (See Comments)  Other reaction(s): Unknown Rosuvastatin (Muscle Pain/Myalgia)  Sulfa (rash and Red burning skin) Penicillin G (Rash and dizziness)  Sitagliptin (Rash)  Wound Dressings Rash  Surgical tape adhesive   Past Medical History:  Asthma without status asthmaticus  Diabetes mellitus without complication (CMS-HCC)  GERD (gastroesophageal reflux disease)  Hyperlipidemia  Hypertension  Pneumonia  Sleep apnea  TIA (transient ischemic attack)   Past Surgical History:  CATARACT EXTRACTION 06/2021  CATARACT EXTRACTION 07/2021  APPENDECTOMY  ARTHROSCOPIC ROTATOR CUFF REPAIR  HYSTERECTOMY VAGINAL   Family History:  Atrial fibrillation (Abnormal heart rhythm sometimes requiring treatment with blood thinners) Mother  Deep vein thrombosis (DVT or abnormal blood clot formation) Mother  Diabetes type II Father  High blood pressure (Hypertension) Father  Stroke Father  Diabetes type II Sister  Arthritis Sister  Myocardial Infarction (Heart attack) Maternal Grandfather  Diabetes type I Cousin   Social History:   Socioeconomic History:  Marital status: Married  Occupational History  Occupation: Retired Pharmacist, hospital  Tobacco Use  Smoking status: Never  Smokeless tobacco: Never  Surveyor, mining  Use: Never used  Substance and Sexual Activity  Alcohol use: No  Drug use: No  Sexual activity: Defer   Review of Systems:  A comprehensive 14 point ROS was performed, reviewed, and the pertinent orthopaedic findings are documented in the HPI.  Physical Exam:  Vitals:  08/09/22 1013  BP: (!) 144/98  Weight: 83.1 kg (183 lb 3.2 oz)  Height: 167.6 cm ('5\' 6"'$ )  PainSc: 0-No pain  PainLoc: Shoulder   General/Constitutional: The patient appears to be well-nourished, well-developed, and in no acute distress. Neuro/Psych: Normal mood and affect, oriented to person, place and time. Eyes: Non-icteric. Pupils are equal, round, and reactive to light, and exhibit synchronous movement. ENT: Unremarkable. Lymphatic: No palpable adenopathy. Respiratory: Lungs clear to auscultation, Normal chest excursion, No wheezes, and Non-labored breathing Cardiovascular: Regular rate and rhythm. No murmurs. and No edema, swelling or tenderness, except as noted in detailed exam. Integumentary: No impressive skin lesions present, except as noted in detailed exam. Musculoskeletal: Unremarkable, except as noted in detailed exam.  Left shoulder exam: SKIN: normal SWELLING: none WARMTH: none LYMPH NODES: no adenopathy palpable CREPITUS: glenohumeral joint, none TENDERNESS: Mildly tender over anterolateral shoulder ROM (active):  Forward flexion: 90 degrees Abduction: 75 degrees Internal rotation: Left PSIS ROM (passive):  Forward flexion: 100 degrees Abduction: 90 degrees ER/IR at 90 abd: 30 degrees / 20  degrees  She describes moderate to severe pain and mild crepitance with all motions  STRENGTH: Forward flexion: 3+-4/5 Abduction: 3+-4/5 External rotation: 4/5 Internal rotation: 4/5 Pain with RC testing: Mild-moderate pain with resisted forward flexion and abduction, and mild pain with resisted external rotation  STABILITY: Normal  SPECIAL TESTS: Luan Pulling' test: positive, moderate Speed's test:  Not evaluated Capsulitis - pain w/ passive ER: no Crossed arm test: Moderately positive Crank: Not evaluated Anterior apprehension: Negative Posterior apprehension: Not evaluated  She is neurovascularly intact to the left upper extremity.  Imaging:  Recent true AP and Y-scapular x-rays of the left shoulder are available for review and have been reviewed by myself. These films demonstrate significant degenerative changes with complete loss of the glenohumeral joint space. The subacromial space is mildly decreased. There is no subacromial or infra-clavicular spurring. She demonstrates a Type II acromion.  Assessment:  1. Primary osteoarthritis of left shoulder.  2. Rotator cuff tendinitis, left.   Plan:  The treatment options were discussed with the patient and her husband. In addition, patient educational materials were provided regarding the diagnosis and treatment options. The patient is quite frustrated by her symptoms and functional limitations, and is ready to consider more aggressive treatment options. Therefore, I have recommended a surgical procedure, specifically a reverse left total shoulder arthroplasty given her age, history of rotator cuff pathology in her right shoulder, and severity of arthritis. The procedure was discussed with the patient, as were the potential risks (including bleeding, infection, nerve and/or blood vessel injury, persistent or recurrent pain, loosening and/or failure of the components, dislocation, leg length inequality, need for further surgery, blood clots, strokes, heart attacks and/or arhythmias, pneumonia, etc.) and benefits. The patient states her understanding and wishes to proceed. All of the patient's questions and concerns were answered. She can call any time with further concerns. She will follow up post-surgery, routine.    H&P reviewed and patient re-examined. No changes.

## 2022-08-27 NOTE — Evaluation (Signed)
Occupational Therapy Evaluation Patient Details Name: Mary Cantrell MRN: [id number] DOB: 26-Jan-1946 Today's Date: 08/27/2022   History of Present Illness Pt is 77 y/o s/o reverse left total shoulder arthroplasty with biceps tenodesis.   Clinical Impression   Upon entering the room, pt supine in bed with no c/o pain and significant other present in room for education. Pt needing increased assistance this session secondary to continued effects of anesthesia. Pt very verbose and needing redirection often. Caregiver educated on polar care, sling use, self care with NWB status, and hand/wrist/elbow exercises. Caregiver returns demonstrations during session. Pt needing multiple cues throughout session to maintain precautions secondary to confusion. Paper handout provided as well. OT ambulated pt to bathroom via min hand held assistance. Pt needing assistance to manage LB clothing but performs hygiene after toileting without assistance. Pt returning back to recliner chair and significant other feels good about education and plan for home. All education completed. OT to complete orders at this time.      Recommendations for follow up therapy are one component of a multi-disciplinary discharge planning process, led by the attending physician.  Recommendations may be updated based on patient status, additional functional criteria and insurance authorization.   Follow Up Recommendations  No OT follow up     Assistance Recommended at Discharge Frequent or constant Supervision/Assistance  Patient can return home with the following A little help with walking and/or transfers;A little help with bathing/dressing/bathroom;Help with stairs or ramp for entrance;Assist for transportation;Assistance with cooking/housework       Equipment Recommendations  None recommended by OT       Precautions / Restrictions Precautions Precautions: Shoulder Shoulder Interventions: Shoulder abduction pillow;At all times;Off  for dressing/bathing/exercises;Don joy ultra sling      Mobility Bed Mobility               General bed mobility comments: seated in recliner    Transfers Overall transfer level: Needs assistance Equipment used: 1 person hand held assist Transfers: Sit to/from Stand, Bed to chair/wheelchair/BSC Sit to Stand: Min assist, Min guard           General transfer comment: ambulation with min guard - min A for balance          ADL either performed or assessed with clinical judgement   ADL Overall ADL's : Needs assistance/impaired                                       General ADL Comments: Pt needing min hand held assistance for ambulation and standing balance. Assistance with LB dressing and education with husband on UB self care, sling, and polar care with precautions.     Vision Patient Visual Report: No change from baseline              Pertinent Vitals/Pain Pain Assessment Pain Assessment: No/denies pain        Extremity/Trunk Assessment Upper Extremity Assessment Upper Extremity Assessment: LUE deficits/detail LUE Deficits / Details: surgical intervention - NWB           Communication Communication Communication: No difficulties   Cognition Arousal/Alertness: Awake/alert Behavior During Therapy: WFL for tasks assessed/performed Overall Cognitive Status: Within Functional Limits for tasks assessed  Home Living Family/patient expects to be discharged to:: Private residence Living Arrangements: Spouse/significant other Available Help at Discharge: Family;Available 24 hours/day Type of Home: House Home Access: Stairs to enter [employer] of Steps: 1   Home Layout: One level               Home Equipment: Conservation officer, nature (2 wheels);Cane - single point          Prior Functioning/Environment Prior Level of Function : Independent/Modified  Independent                                 OT Goals(Current goals can be found in the care plan section) Acute Rehab OT Goals Patient Stated Goal: to go home OT Goal Formulation: With patient/family Time For Goal Achievement: 08/27/22 Potential to Achieve Goals: Fair  OT Frequency:         AM-PAC OT "6 Clicks" Daily Activity     Outcome Measure Help from another person eating meals?: None Help from another person taking care of personal grooming?: None Help from another person toileting, which includes using toliet, bedpan, or urinal?: A Little Help from another person bathing (including washing, rinsing, drying)?: A Little Help from another person to put on and taking off regular upper body clothing?: A Little Help from another person to put on and taking off regular lower body clothing?: A Little 6 Click Score: 20   End of Session Nurse Communication: Mobility status  Activity Tolerance: Patient tolerated treatment well Patient left: in chair;with family/visitor present;with nursing/sitter in room                   Time: [id number] OT Time Calculation (min): 32 min Charges:  OT General Charges $OT Visit: 1 Visit OT Evaluation $OT Eval Moderate Complexity: 1 Mod OT Treatments $Self Care/Home Management : 23-37 mins  Darleen Crocker, MS, OTR/L , CBIS ascom [phone]  08/27/22, 3:55 PM

## 2022-08-27 NOTE — Op Note (Signed)
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  Post-Op Diagnosis:   Same  Procedure:   Reverse left total shoulder arthroplasty with biceps tenodesis.  Surgeon:   Pascal Lux, MD  Assistant:   Benjaman Lobe, RNFA  Anesthesia:   GET  Findings:   As above.  Complications:   None  EBL:   100 cc  Fluids:   600 cc crystalloid  UOP:   None  TT:   None  Drains:   None  Closure:   Staples  Implants:   All press-fit [employer] system with a #9 Identity micro-humeral stem, a 96 mm extended neutral identity humeral tray with a +0 mm insert, a TMR+ mini-base plate with a 20 mm post, and a 36 mm concentric +3 mm offset glenosphere.  Brief Clinical Note:   The patient is a 77 year old female with a long history of progressively worsening pain and weakness of her left shoulder. Her symptoms have progressed despite medications, activity modification, etc. Her history and examination consistent with advanced degenerative joint disease with rotator cuff tendinopathy and biceps tendinopathy, all of which were confirmed by an MRI scan preoperatively. The patient presents at this time for a reverse left total shoulder arthroplasty with biceps tenodesis.  Procedure:   The patient was brought into the operating room and lain in the supine position. After undergoing adequate general endotracheal intubation and anesthesia, the patient was repositioned in the beach chair position using the beach chair positioner. The left shoulder and upper extremity were prepped with ChloraPrep solution before being draped sterilely. Preoperative antibiotics were administered. A timeout was performed to verify the appropriate surgical site.    A standard anterior approach to the shoulder was made through an approximately 4-5 inch incision. The incision was carried down through the  subcutaneous tissues to expose the deltopectoral fascia. The interval between the deltoid and pectoralis muscles was identified and this plane developed, retracting the cephalic vein laterally with the deltoid muscle. The conjoined tendon was identified. Its lateral margin was dissected and the Kolbel self-retraining retractor inserted. The "three sisters" were identified and cauterized. Bursal tissues were removed to improve visualization.   The biceps tendon was identified near the inferior aspect of the bicipital groove. A soft tissue tenodesis was performed by attaching the biceps tendon to the adjacent pectoralis major tendon using two #0 Ethibond interrupted sutures. The biceps tendon was then transected just proximal to the tenodesis site. The subscapularis tendon was released from its attachment to the lesser tuberosity 1 cm proximal to its insertion and several tagging sutures placed. The inferior capsule was released with care after identifying and protecting the axillary nerve. The proximal humeral cut was made at approximately 25 of retroversion using the extra-medullary guide.   Attention was redirected to the glenoid. The labrum was debrided circumferentially before the center of the glenoid was marked with electrocautery. The guidewire was drilled into the glenoid vault using the appropriate guide. After verifying its position, it was overreamed with first the central coring reamer and then the mini-baseplate reamer with the 20 mm extension to create a flat surface. The permanent mini-baseplate with the 20 mm central post was impacted into place. It was stabilized with superior and inferior screws of the appropriate length before locking caps were applied over both screw heads. The permanent concentric +3 mm offset glenosphere was then impacted into place and  its Morse taper locking mechanism verified using manual distraction.  Attention was directed to the humeral side. The humeral canal was  reamed sequentially beginning with the end-cutting reamer then progressing from a 4 mm reamer up to a 9 mm reamer. This provided excellent circumferential chatter. The canal was broached beginning with a # 6 broach and progressing to a #9 broach.  The plastic stem was inserted into the end of the broach and the proximal reaming performed. A trial reduction was performed using the -6 mm extended neutral humeral platform with the +0 mm insert. With the +0 mm insert, the arm demonstrated excellent range of motion as the hand could be brought across the chest to the opposite shoulder and brought to the top of the patient's head and to the patient's ear. The shoulder appeared stable throughout this range of motion. The joint was dislocated and the trial components removed.   The permanent #9 Identity micro-stem was connected with the -6 mm extended neutral humeral platform on the back table before this construct was impacted into place with care taken to maintain the appropriate version. The +0 mm insert was snapped into place. The shoulder was relocated using two finger pressure and again placed through a range of motion with the findings as described above.  The wound was copiously irrigated with sterile saline solution using the jet lavage system before a "cocktail" of 20 cc of Exparel, 30 cc of 0.5% Sensorcaine, 2 cc of Kenalog 40 (80 mg), and 30 mg of Toradol diluted out to 60 cc with normal saline was injected into the pericapsular and peri-incisional tissues to help with postoperative analgesia. The subscapularis tendon was reapproximated using #2 FiberWire interrupted sutures. The deltopectoral interval was closed using #0 Vicryl interrupted sutures before the subcutaneous tissues were closed using 2-0 Vicryl interrupted sutures. The skin was closed using staples. Prior to closing the skin, 1 g of transexemic acid in 10 cc of normal saline was injected intra-articularly to help with postoperative bleeding. A  sterile occlusive dressing was applied to the wound before the arm was placed into a shoulder immobilizer with an abduction pillow. A Polar Care system also was applied to the shoulder. The patient was then transferred back to a hospital bed before being awakened, extubated, and returned to the recovery room in satisfactory condition after tolerating the procedure well.

## 2022-08-27 NOTE — Discharge Instructions (Addendum)
Orthopedic discharge instructions: May shower or sponge bathe with intact OpSite dressing.  Apply ice frequently to shoulder or use Polar Care device. Take ibuprofen 600-800 mg TID with meals for 7-10 days, then as necessary. May supplement with ES Tylenol (500 mg) between doses of ibuprofen as needed, utilizing a maximum of 8 exercise Tylenol tablets per day. Keep shoulder immobilizer on at all times except may remove for bathing purposes. Start home physical therapy as arranged with home therapy company. Follow-up in 10-14 days or as scheduled.   "   got NSAID same as Ibuprofen at 1":50 pm, can get some Ibuprofen after 10:00 pm. Got Tylenol 1,000 mg at 11:45 am, can get some Tylenol after 6:00 pm if needed for pain."   AMBULATORY SURGERY  DISCHARGE INSTRUCTIONS  The drugs that you were given will stay in your system until tomorrow so for the next 24 hours you should not:  Drive an automobile Make any legal decisions Drink any alcoholic beverage  You may resume regular meals tomorrow.  Today it is better to start with liquids and gradually work up to solid foods.  You may eat anything you prefer, but it is better to start with liquids, then soup and crackers, and gradually work up to solid foods.  Please notify your doctor immediately if you have any unusual bleeding, trouble breathing, redness and pain at the surgery site, drainage, fever, or pain not relieved by medication.  Additional Instructions:  Please contact your physician with any problems or Same Day Surgery at [phone], Monday through Friday 6 am to 4 pm, or [place] at [facility] number at [phone].         Interscalene Nerve Block with Exparel   For your surgery you have received an Interscalene Nerve Block with Exparel. Nerve Blocks affect many types of nerves, including nerves that control movement, pain and normal sensation.  You may experience feelings such as numbness, tingling, heaviness,  weakness or the inability to move your arm or the feeling or sensation that your arm has "fallen asleep". A nerve block with Exparel can last up to 5 days.  Usually the weakness wears off first.  The tingling and heaviness usually wear off next.  Finally you may start to notice pain.  Keep in mind that this may occur in any order.  Once a nerve block starts to wear off it is usually completely gone within 60 minutes. ISNB may cause mild shortness of breath, a hoarse voice, blurry vision, unequal pupils, or drooping of the face on the same side as the nerve block.  These symptoms will usually resolve with the numbness.  Very rarely the procedure itself can cause mild seizures. If needed, your surgeon will give you a prescription for pain medication.  It will take about 60 minutes for the oral pain medication to become fully effective.  So, it is recommended that you start taking this medication before the nerve block first begins to wear off, or when you first begin to feel discomfort. Take your pain medication only as prescribed.  Pain medication can cause sedation and decrease your breathing if you take more than you need for the level of pain that you have. Nausea is a common side effect of many pain medications.  You may want to eat something before taking your pain medicine to prevent nausea. After an Interscalene nerve block, you cannot feel pain, pressure or extremes in temperature in the effected arm.  Because your arm is numb  it is at an increased risk for injury.  To decrease the possibility of injury, please practice the following:  While you are awake change the position of your arm frequently to prevent too much pressure on any one area for prolonged periods of time.  If you have a cast or tight dressing, check the color or your fingers every couple of hours.  Call your surgeon with the appearance of any discoloration (white or blue). If you are given a sling to wear before you go home, please  wear it  at all times until the block has completely worn off.  Do not get up at night without your sling. Please contact [facility] or your surgeon if you do not begin to regain sensation after 7 days from the surgery.  Anesthesia may be contacted by calling the Same Day Surgery Department, Mon. through Fri., 6 am to 4 pm at [phone].   If you experience any other problems or concerns, please contact your surgeon's office. If you experience severe or prolonged shortness of breath go to the nearest emergency department.     POLAR CARE INFORMATION  [email or web address]  How to use [place] Cold Therapy System?  YouTube   [email or web address]  OPERATING INSTRUCTIONS  Start the product With dry hands, connect the transformer to the electrical connection located on the top of the cooler. Next, plug the transformer into an appropriate electrical outlet. The unit will automatically start running at this point.  To stop the pump, disconnect electrical power.  Unplug to stop the product when not in use. Unplugging the Polar Care unit turns it off. Always unplug immediately after use. Never leave it plugged in while unattended. Remove pad.    FIRST ADD WATER TO FILL LINE, THEN ICE---Replace ice when existing ice is almost melted  1 Discuss Treatment with your [place] Practitioner and Use Only as Prescribed 2 Apply Insulation Barrier & Cold Therapy Pad 3 Check for Moisture 4 Inspect Skin Regularly  Tips and Trouble Shooting Usage Tips 1. Use cubed or chunked ice for optimal performance. 2. It is recommended to drain the Pad between uses. To drain the pad, hold the Pad upright with the hose pointed toward the ground. Depress the black plunger and allow water to drain out. 3. You may disconnect the Pad from the unit without removing the pad from the affected area by depressing the silver tabs on the hose coupling and gently pulling the hoses  apart. The Pad and unit will seal itself and will not leak. Note: Some dripping during release is normal. 4. DO NOT RUN PUMP WITHOUT WATER! The pump in this unit is designed to run with water. Running the unit without water will cause permanent damage to the pump. 5. Unplug unit before removing lid.  TROUBLESHOOTING GUIDE Pump not running, Water not flowing to the pad, Pad is not getting cold 1. Make sure the transformer is plugged into the wall outlet. 2. Confirm that the ice and water are filled to the indicated levels. 3. Make sure there are no kinks in the pad. 4. Gently pull on the blue tube to make sure the tube/pad junction is straight. 5. Remove the pad from the treatment site and ll it while the pad is lying at; then reapply. 6. Confirm that the pad couplings are securely attached to the unit. Listen for the double clicks (Figure 1) to confirm the pad couplings are securely attached.  Leaks    Note: Some  condensation on the lines, controller, and pads is unavoidable, especially in warmer climates. 1. If using a [place] Polar Care Cold Therapy unit with a detachable Cold Therapy Pad, and a leak exists (other than condensation on the lines) disconnect the pad couplings. Make sure the silver tabs on the couplings are depressed before reconnecting the pad to the pump hose; then confirm both sides of the coupling are properly clicked in. 2. If the coupling continues to leak or a leak is detected in the pad itself, stop using it and call [place] at [phone].  Cleaning After use, empty and dry the unit with a soft cloth. Warm water and mild detergent may be used occasionally to clean the pump and tubes.  WARNING: The [place] can be cold enough to cause serious injury, including full skin necrosis. Follow these Operating Instructions, and carefully read the Product Insert (see pouch on side of unit) and the Cold Therapy Pad Fitting Instructions (provided with each Cold  Therapy Pad) prior to use.   SHOULDER SLING IMMOBILIZER   VIDEO Slingshot 2 Shoulder Brace Application - YouTube ---[email or web address]  INSTRUCTIONS While supporting the injured arm, slide the forearm into the sling. Wrap the adjustable shoulder strap around the neck and shoulders and attach the strap end to the sling using  the "alligator strap tab."  Adjust the shoulder strap to the required length. Position the shoulder pad behind the neck. To secure the shoulder pad location (optional), pull the shoulder strap away from the shoulder pad, unfold the hook material on the top of the pad, then press the shoulder strap back onto the hook material to secure the pad in place. Attach the closure strap across the open top of the sling. Position the strap so that it holds the arm securely in the sling. Next, attach the thumb strap to the open end of the sling between the thumb and fingers. After sling has been fit, it may be easily removed and reapplied using the quick release buckle on shoulder strap. If a neutral pillow or 15 abduction pillow is included, place the pillow at the waistline. Attach the sling to the pillow, lining up hook material on the pillow with the loop on sling. Adjust the waist strap to fit.  If waist strap is too long, cut it to fit. Use the small piece of double sided hook material (located on top of the pillow) to secure the strap end. Place the double sided hook material on the inside of the cut strap end and secure it to the waist strap.     If no pillow is included, attach the waist strap to the sling and adjust to fit.    Washing Instructions: Straps and sling must be removed and cleaned regularly depending on your activity level and perspiration. Hand wash straps and sling in cold water with mild detergent, rinse, air dry

## 2022-08-27 NOTE — Anesthesia Procedure Notes (Signed)
Procedure Name: Intubation Date/Time: 08/27/2022 11:04 AM  Performed by: Cammie Sickle, CRNAPre-anesthesia Checklist: Patient identified, Patient being monitored, Timeout performed, Emergency Drugs available and Suction available Patient Re-evaluated:Patient Re-evaluated prior to induction Oxygen Delivery Method: Circle system utilized Preoxygenation: Pre-oxygenation with 100% oxygen Induction Type: IV induction Ventilation: Mask ventilation without difficulty Laryngoscope Size: 3 and McGraph Grade View: Grade I Tube type: Oral Tube size: 6.5 mm Number of attempts: 1 Airway Equipment and Method: Stylet Placement Confirmation: ETT inserted through vocal cords under direct vision, positive ETCO2 and breath sounds checked- equal and bilateral Secured at: 22 cm Tube secured with: Tape Dental Injury: Teeth and Oropharynx as per pre-operative assessment  Comments: Smooth atraumatic intubation, no complications noted.

## 2022-08-27 NOTE — Transfer of Care (Signed)
Immediate Anesthesia Transfer of Care Note  Patient: Mary Cantrell  Procedure(s) Performed: REVERSE SHOULDER ARTHROPLASTY WITH BICEPS TENODESIS.- RNFA (Left: Shoulder)  Patient Location: PACU  Anesthesia Type:General  Level of Consciousness: drowsy  Airway & Oxygen Therapy: Patient Spontanous Breathing and Patient connected to face mask oxygen  Post-op Assessment: Report given to RN and Post -op Vital signs reviewed and stable  Post vital signs: Reviewed and stable  Last Vitals:  Vitals Value Taken Time  BP 135/64 08/27/22 1331  Temp 35.8 1331  Pulse 68 08/27/22 1334  Resp 19 08/27/22 1334  SpO2 98 % 08/27/22 1334  Vitals shown include unvalidated device data.  Last Pain:  Vitals:   08/27/22 0812  TempSrc: Oral  PainSc: 0-No pain         Complications: No notable events documented.

## 2022-08-28 ENCOUNTER — Encounter: Payer: Self-pay | Admitting: Surgery

## 2022-08-28 NOTE — Progress Notes (Signed)
Pt voice is very hoarse.  Informed pt if does not improve by Monday she can give [place] a call back and let us know.  She verbalized understanding.

## 2022-08-29 LAB — SURGICAL PATHOLOGY

## 2022-09-21 ENCOUNTER — Other Ambulatory Visit: Payer: Self-pay | Admitting: Family Medicine

## 2022-09-21 DIAGNOSIS — I10 Essential (primary) hypertension: Secondary | ICD-10-CM

## 2022-10-14 ENCOUNTER — Ambulatory Visit: Payer: Medicare Other | Admitting: Family Medicine

## 2022-10-15 ENCOUNTER — Encounter: Payer: Self-pay | Admitting: Family Medicine

## 2022-10-22 NOTE — Progress Notes (Unsigned)
Name: Mary Cantrell   MRN: [id number]    DOB: 1945/09/18   Date:10/23/2022       Progress Note  Subjective  Chief Complaint  Follow Up  HPI  DMII: Last A1C was high at 8.8 %, she has been compliant with medication, also lost weight 15 lbs due to early satiety, lack of appetite and possibly secondary to depression. Today A1C is down to 5.8 %, advised to stop taking glipizide 2.5 mg and monitor glucose, if hypoglycemic episodes we will adjust dose of medications again . Return in 2 months for follow up  HTN: BP is at goal, last visit we adjusted dose of Benicar, currently on 40 mg , no chest pain. Tolerating medication well   Unintentional Weight loss/Malnutrition/early satiety: going on for months. She has lost 15 lbs in the past 3 months. States she eats but full after a couple of bites, denies abdominal pain or change in bowel movements. Discussed importance of evaluation and she is wiling to see GI  MDD: she is upset about not being able to move up [place], having issues with the property they want to move into. It may be the cause of lack of appetite. Discussed medication but she refuses   Patient Active Problem List   Diagnosis Date Noted   Mild intermittent asthma without complication 07/24/2022   Hypertension, benign 08/22/2021   Long term (current) use of systemic steroids 08/22/2021   Lumbar spondylosis 08/22/2021   Osteoarthritis 08/22/2021   DDD (degenerative disc disease), lumbar 10/24/2020   Neuropathy 02/27/2020   Sensory ataxia 02/27/2020   Tremor 02/27/2020   Chronic kidney disease, stage III (moderate) (HCC) 12/09/2018   Dyslipidemia associated with type 2 diabetes mellitus (HCC) 12/09/2018   Obesity (BMI 30.0-34.9) 07/17/2018   Hypertension associated with stage 2 chronic kidney disease due to type 2 diabetes mellitus (HCC) 03/15/2018   Psoriatic arthritis (HCC) 12/04/2017   Stenosis of left carotid artery 12/16/2016   Polymyalgia rheumatica (HCC) 04/13/2015   Back  pain, chronic 12/21/2014   Narrowing of intervertebral disc space 12/21/2014   Diabetic gastroparesis (HCC) 12/21/2014   Amaurosis fugax of left eye 12/21/2014   H/O: hypothyroidism 12/21/2014   Hyperlipidemia 12/21/2014   IBS (irritable bowel syndrome) 12/21/2014   Disorder of macula of retina 12/21/2014   Asthma, moderate 12/21/2014   NASH (nonalcoholic steatohepatitis) 12/21/2014   NS (nuclear sclerosis) 12/21/2014   Osteoarthrosis 12/21/2014   Psoriasis 12/21/2014   Knee torn cartilage 12/21/2014   Obstructive apnea 01/03/2014   History of shoulder surgery 04/23/2011    Past Surgical History:  Procedure Laterality Date   ABDOMINAL HYSTERECTOMY     APPENDECTOMY     ARTERY BIOPSY N/A 02/23/2015   Procedure: BIOPSY TEMPORAL ARTERY;  Surgeon: Annice Needy, MD;  Location: [facility] ORS;  Service: Vascular;  Laterality: N/A;   BREAST BIOPSY Left    1990's   CATARACT EXTRACTION W/PHACO Left 07/24/2021   Procedure: CATARACT EXTRACTION PHACO AND INTRAOCULAR LENS PLACEMENT (IOC) LEFT DIABETIC 8.37 00:50.0;  Surgeon: Galen Manila, MD;  Location: [facility];  Service: Ophthalmology;  Laterality: Left;  Diabetic   CATARACT EXTRACTION W/PHACO Right 08/07/2021   Procedure: CATARACT EXTRACTION PHACO AND INTRAOCULAR LENS PLACEMENT (IOC) RIGHT DIABETIC 7.29 00:48.4;  Surgeon: Galen Manila, MD;  Location: [facility];  Service: Ophthalmology;  Laterality: Right;  Diabetic   EXCISION / CURETTAGE BONE CYST FINGER     Left Index Finger   PILONIDAL CYST EXCISION     REVERSE SHOULDER ARTHROPLASTY  Left 08/27/2022   Procedure: REVERSE SHOULDER ARTHROPLASTY WITH BICEPS TENODESIS.- RNFA;  Surgeon: Christena Flake, MD;  Location: [facility] ORS;  Service: Orthopedics;  Laterality: Left;   SHOULDER ARTHROSCOPY Right [id number]   Dr. Gerrit Heck   STOMACH SURGERY  1941   3 arteries leaking in stomach after appendectomy   TONSILLECTOMY      Family History  Problem Relation Age of Onset   Rheum  arthritis Father    Diabetes Father    Pneumonia Father    Pneumonia Mother    Diabetes Sister    Osteoarthritis Sister    Breast cancer Neg Hx     Social History   Tobacco Use   Smoking status: Never   Smokeless tobacco: Never  Substance Use Topics   Alcohol use: No    Alcohol/week: 0.0 standard drinks of alcohol     Current Outpatient Medications:    aspirin 81 MG EC tablet, Take 1 tablet by mouth daily., Disp: , Rfl:    azelastine (ASTELIN) 0.1 % nasal spray, Place 1-2 sprays into both nostrils 2 (two) times daily. Use in each nostril as directed, Disp: , Rfl:    Calcium Carbonate-Vit D-Min ([employer] CALCIUM 600 + D/MINERALS) 600-400 MG-UNIT TABS, Take 1 tablet by mouth daily., Disp: , Rfl: 1   cholecalciferol (VITAMIN D3) 25 MCG (1000 UNIT) tablet, Take 1,000 Units by mouth daily., Disp: , Rfl:    Cinnamon 500 MG capsule, Take 2,000 mg by mouth daily., Disp: , Rfl:    cyanocobalamin 100 MCG tablet, Take 1,000 mcg by mouth 2 (two) times a week., Disp: , Rfl:    dicyclomine (BENTYL) 20 MG tablet, Take 1 tablet (20 mg total) by mouth 3 (three) times daily as needed for spasms., Disp: 360 tablet, Rfl: 0   diphenhydrAMINE (BENADRYL) 25 mg capsule, Take 25 mg by mouth at bedtime as needed., Disp: , Rfl:    fluticasone (FLONASE) 50 MCG/ACT nasal spray, USE 2 SPRAYS IN EACH NOSTRIL AT BEDTIME, Disp: 48 g, Rfl: 3   folic acid (FOLVITE) 1 MG tablet, Take 1 mg by mouth daily. , Disp: , Rfl:    glipiZIDE (GLUCOTROL XL) 2.5 MG 24 hr tablet, Take 1 tablet (2.5 mg total) by mouth every morning., Disp: 90 tablet, Rfl: 1   glipiZIDE (GLUCOTROL XL) 5 MG 24 hr tablet, Take 1 tablet (5 mg total) by mouth every morning., Disp: 90 tablet, Rfl: 1   glucose blood (ONE TOUCH ULTRA TEST) test strip, 1 each by Other route daily. Check fsbs once a day  E11.69, E66.9, Disp: 100 each, Rfl: 2   ibuprofen (ADVIL) 200 MG tablet, Take 200 mg by mouth 3 (three) times daily., Disp: , Rfl:    metFORMIN (GLUCOPHAGE-XR)  750 MG 24 hr tablet, TAKE 2 TABLETS (1,500 MG TOTAL) DAILY WITH BREAKFAST, Disp: 180 tablet, Rfl: 1   methotrexate (RHEUMATREX) 2.5 MG tablet, Take 20 mg by mouth once a week. Caution:Chemotherapy. Protect from light., Disp: , Rfl:    montelukast (SINGULAIR) 10 MG tablet, Take 1 tablet by mouth at bedtime., Disp: , Rfl:    Multiple Vitamins-Minerals (MULTIVITAMIN ADULTS PO), Take 1 tablet by mouth daily. , Disp: , Rfl:    olmesartan (BENICAR) 40 MG tablet, Take 1 tablet (40 mg total) by mouth daily. In place of losartan, Disp: 90 tablet, Rfl: 1   pravastatin (PRAVACHOL) 40 MG tablet, Take 1 tablet (40 mg total) by mouth every evening. TAKE 1 TABLET DAILY  (AWAITING LABS OF NEXT VISIT FOR  FURTHER REFILLS), Disp: , Rfl:    predniSONE (DELTASONE) 5 MG tablet, Take 5 mg by mouth daily., Disp: , Rfl:    vitamin C (ASCORBIC ACID) 500 MG tablet, Take 1 tablet by mouth at bedtime. , Disp: , Rfl:    budesonide-formoterol (SYMBICORT) 160-4.5 MCG/ACT inhaler, Inhale 2 puffs into the lungs every morning., Disp: , Rfl:   Allergies  Allergen Reactions   Doxycycline Other (See Comments)    Weakness, flu like symptoms   Codeine     Dizzy   Lantus  [Insulin Glargine]     made skin hot   Oxycodone     Dizziness   Rosuvastatin     Myalgia    Penicillin G Rash    And dizziness   Sitagliptin Rash   Sulfa Antibiotics Rash    Other reaction(s):   Red burning skin   Wound Dressing Adhesive Rash    Surgical tape adhesive    I personally reviewed active problem list, medication list, allergies, family history, social history, health maintenance with the patient/caregiver today.   ROS  Ten systems reviewed and is negative except as mentioned in HPI   Objective  Vitals:   10/23/22 1134  BP: 138/86  Pulse: 87  Resp: 16  SpO2: 97%  Weight: 171 lb (77.6 kg)  Height: 5\' 6"  (1.676 m)    Body mass index is 27.6 kg/m.  Physical Exam  Constitutional: Patient appears well-developed and  well-nourished. Obese  No distress.  HEENT: head atraumatic, normocephalic, pupils equal and reactive to light, neck supple Cardiovascular: Normal rate, regular rhythm and normal heart sounds.  No murmur heard. No BLE edema. Pulmonary/Chest: Effort normal and breath sounds normal. No respiratory distress. Abdominal: Soft.  There is no tenderness. Psychiatric: Patient has a normal mood and affect. behavior is normal. Judgment and thought content normal.     PHQ2/9:    10/23/2022   11:33 AM 07/24/2022    1:49 PM 05/28/2022    8:26 AM 05/15/2022   10:25 AM 01/28/2022    1:33 PM  Depression screen PHQ 2/9  Decreased Interest 2 0 0 0 2  Down, Depressed, Hopeless 1 0 0 0 3  PHQ - 2 Score 3 0 0 0 5  Altered sleeping 0 0 0 0 0  Tired, decreased energy 0 0 0 0 3  Change in appetite 3 0 0 0 3  Feeling bad or failure about yourself  0 0 0 0 3  Trouble concentrating 0 0 0 0 3  Moving slowly or fidgety/restless 0 0 0 0 3  Suicidal thoughts 0 0 0 0 3  PHQ-9 Score 6 0 0 0 23  Difficult doing work/chores  Not difficult at all Not difficult at all Not difficult at all Somewhat difficult    phq 9 is positive   Fall Risk:    10/23/2022   11:33 AM 07/24/2022    1:49 PM 05/28/2022    8:26 AM 05/15/2022   10:25 AM 01/28/2022    1:23 PM  Fall Risk   Falls in the past year? 0 0 0 0 0  Number falls in past yr: 0 0 0 0   Injury with Fall? 0 0 0 0   Risk for fall due to : No Fall Risks No Fall Risks No Fall Risks No Fall Risks No Fall Risks  Follow up Falls prevention discussed Falls prevention discussed;Education provided;Falls evaluation completed Falls prevention discussed;Education provided;Falls evaluation completed Falls prevention discussed;Education provided;Falls evaluation completed Falls  prevention discussed      Functional Status Survey: Is the patient deaf or have difficulty hearing?: Yes Does the patient have difficulty seeing, even when wearing glasses/contacts?: No Does the patient  have difficulty concentrating, remembering, or making decisions?: Yes Does the patient have difficulty walking or climbing stairs?: Yes Does the patient have difficulty dressing or bathing?: Yes Does the patient have difficulty doing errands alone such as visiting a doctor's office or shopping?: Yes    Assessment & Plan  1. Dyslipidemia associated with type 2 diabetes mellitus (HCC)  - POCT HgB A1C - HM Diabetes Foot Exam - glucose blood (ONETOUCH ULTRA) test strip; 1 each by Other route 2 (two) times daily. Use as instructed  Dispense: 100 each; Refill: 2  2. Mild protein-calorie malnutrition (HCC)  Discussed increase in protein in her diet and snacks  3. Early satiety  - Ambulatory referral to Gastroenterology  4. Hypertension, benign  Improved, continue medication  5. Unintentional weight loss of 10% body weight within 6 months  - Ambulatory referral to Gastroenterology  6. Moderate episode of recurrent major depressive disorder (HCC)  Refuses medication

## 2022-10-23 ENCOUNTER — Encounter: Payer: Self-pay | Admitting: Family Medicine

## 2022-10-23 ENCOUNTER — Ambulatory Visit (INDEPENDENT_AMBULATORY_CARE_PROVIDER_SITE_OTHER): Payer: Medicare Other | Admitting: Family Medicine

## 2022-10-23 VITALS — BP 138/86 | HR 87 | Resp 16 | Ht 66.0 in | Wt 171.0 lb

## 2022-10-23 DIAGNOSIS — I10 Essential (primary) hypertension: Secondary | ICD-10-CM | POA: Diagnosis not present

## 2022-10-23 DIAGNOSIS — E1169 Type 2 diabetes mellitus with other specified complication: Secondary | ICD-10-CM | POA: Diagnosis not present

## 2022-10-23 DIAGNOSIS — E441 Mild protein-calorie malnutrition: Secondary | ICD-10-CM | POA: Diagnosis not present

## 2022-10-23 DIAGNOSIS — F331 Major depressive disorder, recurrent, moderate: Secondary | ICD-10-CM

## 2022-10-23 DIAGNOSIS — E785 Hyperlipidemia, unspecified: Secondary | ICD-10-CM | POA: Diagnosis not present

## 2022-10-23 DIAGNOSIS — R6881 Early satiety: Secondary | ICD-10-CM | POA: Diagnosis not present

## 2022-10-23 DIAGNOSIS — R634 Abnormal weight loss: Secondary | ICD-10-CM

## 2022-10-23 LAB — POCT GLYCOSYLATED HEMOGLOBIN (HGB A1C): Hemoglobin A1C: 5.8 % — AB (ref 4.0–5.6)

## 2022-10-23 MED ORDER — ONETOUCH ULTRA VI STRP: 1.0000 | ORAL_STRIP | Freq: Two times a day (BID) | 2 refills | Status: DC

## 2022-10-23 NOTE — Patient Instructions (Signed)
Stop taking glipizide 2.5 mg and monitor your sugar if glucose drops we need to adjust dose again

## 2022-11-07 ENCOUNTER — Telehealth: Payer: Self-pay | Admitting: Family Medicine

## 2022-11-07 NOTE — Telephone Encounter (Signed)
Copied from CRM [id number]. Topic: General - Inquiry >> Nov 07, 2022  3:12 PM Runell Gess P wrote: Reason for CRM: pt called saying [facility] said her insurance denied her request for glucose test strips saying they only pay for one strip a day

## 2022-11-08 ENCOUNTER — Other Ambulatory Visit: Payer: Self-pay

## 2022-11-08 DIAGNOSIS — E1169 Type 2 diabetes mellitus with other specified complication: Secondary | ICD-10-CM

## 2022-11-08 MED ORDER — ONETOUCH ULTRA VI STRP: 1.0000 | ORAL_STRIP | Freq: Every day | 2 refills | Status: DC

## 2022-11-08 MED ORDER — ONETOUCH ULTRA VI STRP: 1.0000 | ORAL_STRIP | Freq: Every day | 2 refills | Status: AC

## 2022-11-08 NOTE — Telephone Encounter (Signed)
Quantity update and re-sent to [facility]

## 2022-11-13 ENCOUNTER — Ambulatory Visit: Payer: Medicare Other | Admitting: Family Medicine

## 2022-12-11 ENCOUNTER — Other Ambulatory Visit: Payer: Self-pay | Admitting: Family Medicine

## 2022-12-11 DIAGNOSIS — Z1231 Encounter for screening mammogram for malignant neoplasm of breast: Secondary | ICD-10-CM

## 2022-12-16 ENCOUNTER — Ambulatory Visit (INDEPENDENT_AMBULATORY_CARE_PROVIDER_SITE_OTHER): Payer: Medicare Other | Admitting: Physician Assistant

## 2022-12-16 ENCOUNTER — Encounter: Payer: Self-pay | Admitting: Physician Assistant

## 2022-12-16 DIAGNOSIS — E669 Obesity, unspecified: Secondary | ICD-10-CM | POA: Diagnosis not present

## 2022-12-16 DIAGNOSIS — E1169 Type 2 diabetes mellitus with other specified complication: Secondary | ICD-10-CM

## 2022-12-16 DIAGNOSIS — Z7984 Long term (current) use of oral hypoglycemic drugs: Secondary | ICD-10-CM

## 2022-12-16 MED ORDER — METFORMIN HCL ER 750 MG PO TB24: ORAL_TABLET | ORAL | 1 refills | Status: DC

## 2022-12-16 NOTE — Progress Notes (Signed)
Established Patient Office Visit  Name: Mary Cantrell   MRN: [id number]    DOB: October 20, 1945   Date:12/16/2022  Today's Provider: Jacquelin Hawking, MHS, PA-C Introduced myself to the patient as a PA-C and provided education on APPs in clinical practice.         Subjective  Chief Complaint  Chief Complaint  Patient presents with   Medication Refill    HPI  Patient presents for medication management concern  She reports she was contacted by [facility] and told that a medication starting with an "M" needs a refill  She was told she needed an apt to get further refills 6 month supply of Metformin was sent in to [facility] Feb   Patient Active Problem List   Diagnosis Date Noted   Type 2 diabetes mellitus with obesity (HCC) 12/16/2022   Mild intermittent asthma without complication 07/24/2022   Hypertension, benign 08/22/2021   Long term (current) use of systemic steroids 08/22/2021   Lumbar spondylosis 08/22/2021   Osteoarthritis 08/22/2021   DDD (degenerative disc disease), lumbar 10/24/2020   Neuropathy 02/27/2020   Sensory ataxia 02/27/2020   Tremor 02/27/2020   Chronic kidney disease, stage III (moderate) (HCC) 12/09/2018   Dyslipidemia associated with type 2 diabetes mellitus (HCC) 12/09/2018   Obesity (BMI 30.0-34.9) 07/17/2018   Hypertension associated with stage 2 chronic kidney disease due to type 2 diabetes mellitus (HCC) 03/15/2018   Psoriatic arthritis (HCC) 12/04/2017   Stenosis of left carotid artery 12/16/2016   Polymyalgia rheumatica (HCC) 04/13/2015   Back pain, chronic 12/21/2014   Narrowing of intervertebral disc space 12/21/2014   Diabetic gastroparesis (HCC) 12/21/2014   Amaurosis fugax of left eye 12/21/2014   H/O: hypothyroidism 12/21/2014   Hyperlipidemia 12/21/2014   IBS (irritable bowel syndrome) 12/21/2014   Disorder of macula of retina 12/21/2014   Asthma, moderate 12/21/2014   NASH (nonalcoholic steatohepatitis) 12/21/2014   NS (nuclear  sclerosis) 12/21/2014   Osteoarthrosis 12/21/2014   Psoriasis 12/21/2014   Knee torn cartilage 12/21/2014   Obstructive apnea 01/03/2014   History of shoulder surgery 04/23/2011    Past Surgical History:  Procedure Laterality Date   ABDOMINAL HYSTERECTOMY     APPENDECTOMY     ARTERY BIOPSY N/A 02/23/2015   Procedure: BIOPSY TEMPORAL ARTERY;  Surgeon: Annice Needy, MD;  Location: [facility];  Service: Vascular;  Laterality: N/A;   BREAST BIOPSY Left    1990's   CATARACT EXTRACTION W/PHACO Left 07/24/2021   Procedure: CATARACT EXTRACTION PHACO AND INTRAOCULAR LENS PLACEMENT (IOC) LEFT DIABETIC 8.37 00:50.0;  Surgeon: Galen Manila, MD;  Location: [facility];  Service: Ophthalmology;  Laterality: Left;  Diabetic   CATARACT EXTRACTION W/PHACO Right 08/07/2021   Procedure: CATARACT EXTRACTION PHACO AND INTRAOCULAR LENS PLACEMENT (IOC) RIGHT DIABETIC 7.29 00:48.4;  Surgeon: Galen Manila, MD;  Location: [facility];  Service: Ophthalmology;  Laterality: Right;  Diabetic   EXCISION / CURETTAGE BONE CYST FINGER     Left Index Finger   PILONIDAL CYST EXCISION     REVERSE SHOULDER ARTHROPLASTY Left 08/27/2022   Procedure: REVERSE SHOULDER ARTHROPLASTY WITH BICEPS TENODESIS.- RNFA;  Surgeon: Christena Flake, MD;  Location: [facility];  Service: Orthopedics;  Laterality: Left;   SHOULDER ARTHROSCOPY Right [id number]   Dr. Gerrit Heck   STOMACH SURGERY  1941   3 arteries leaking in stomach after appendectomy   TONSILLECTOMY      Family History  Problem Relation Age of Onset  Rheum arthritis Father    Diabetes Father    Pneumonia Father    Pneumonia Mother    Diabetes Sister    Osteoarthritis Sister    Breast cancer Neg Hx     Social History   Tobacco Use   Smoking status: Never   Smokeless tobacco: Never  Substance Use Topics   Alcohol use: No    Alcohol/week: 0.0 standard drinks of alcohol     Current Outpatient Medications:    aspirin 81 MG EC tablet, Take 1 tablet  by mouth daily., Disp: , Rfl:    azelastine (ASTELIN) 0.1 % nasal spray, Place 1-2 sprays into both nostrils 2 (two) times daily. Use in each nostril as directed, Disp: , Rfl:    Calcium Carbonate-Vit D-Min ([facility] CALCIUM 600 + D/MINERALS) 600-400 MG-UNIT TABS, Take 1 tablet by mouth daily., Disp: , Rfl: 1   cholecalciferol (VITAMIN D3) 25 MCG (1000 UNIT) tablet, Take 1,000 Units by mouth daily., Disp: , Rfl:    Cinnamon 500 MG capsule, Take 2,000 mg by mouth daily., Disp: , Rfl:    cyanocobalamin 100 MCG tablet, Take 1,000 mcg by mouth 2 (two) times a week., Disp: , Rfl:    dicyclomine (BENTYL) 20 MG tablet, Take 1 tablet (20 mg total) by mouth 3 (three) times daily as needed for spasms., Disp: 360 tablet, Rfl: 0   diphenhydrAMINE (BENADRYL) 25 mg capsule, Take 25 mg by mouth at bedtime as needed., Disp: , Rfl:    fluticasone (FLONASE) 50 MCG/ACT nasal spray, USE 2 SPRAYS IN EACH NOSTRIL AT BEDTIME, Disp: 48 g, Rfl: 3   folic acid (FOLVITE) 1 MG tablet, Take 1 mg by mouth daily. , Disp: , Rfl:    glipiZIDE (GLUCOTROL XL) 5 MG 24 hr tablet, Take 1 tablet (5 mg total) by mouth every morning., Disp: 90 tablet, Rfl: 1   glucose blood (ONETOUCH ULTRA) test strip, 1 each by Other route daily. Use as instructed, Disp: 100 each, Rfl: 2   ibuprofen (ADVIL) 200 MG tablet, Take 200 mg by mouth 3 (three) times daily., Disp: , Rfl:    methotrexate (RHEUMATREX) 2.5 MG tablet, Take 20 mg by mouth once a week. Caution:Chemotherapy. Protect from light., Disp: , Rfl:    montelukast (SINGULAIR) 10 MG tablet, Take 1 tablet by mouth at bedtime., Disp: , Rfl:    Multiple Vitamins-Minerals (MULTIVITAMIN ADULTS PO), Take 1 tablet by mouth daily. , Disp: , Rfl:    olmesartan (BENICAR) 40 MG tablet, Take 1 tablet (40 mg total) by mouth daily. In place of losartan, Disp: 90 tablet, Rfl: 1   pravastatin (PRAVACHOL) 40 MG tablet, Take 1 tablet (40 mg total) by mouth every evening. TAKE 1 TABLET DAILY  (AWAITING LABS OF NEXT  VISIT FOR FURTHER REFILLS), Disp: , Rfl:    predniSONE (DELTASONE) 5 MG tablet, Take 5 mg by mouth daily., Disp: , Rfl:    vitamin C (ASCORBIC ACID) 500 MG tablet, Take 1 tablet by mouth at bedtime. , Disp: , Rfl:    budesonide-formoterol (SYMBICORT) 160-4.5 MCG/ACT inhaler, Inhale 2 puffs into the lungs every morning., Disp: , Rfl:    metFORMIN (GLUCOPHAGE-XR) 750 MG 24 hr tablet, TAKE 2 TABLETS (1,500 MG TOTAL) DAILY WITH BREAKFAST, Disp: 180 tablet, Rfl: 1  Allergies  Allergen Reactions   Doxycycline Other (See Comments)    Weakness, flu like symptoms   Codeine     Dizzy   Lantus  [Insulin Glargine]     made skin hot  Oxycodone     Dizziness   Rosuvastatin     Myalgia    Penicillin G Rash    And dizziness   Sitagliptin Rash   Sulfa Antibiotics Rash    Other reaction(s):   Red burning skin   Wound Dressing Adhesive Rash    Surgical tape adhesive    I personally reviewed active problem list, medication list, notes from last encounter, lab results with the patient/caregiver today.   Review of Systems  Constitutional:  Positive for weight loss.  Respiratory:  Negative for shortness of breath and wheezing.   Cardiovascular:  Negative for chest pain and leg swelling.  Musculoskeletal:  Negative for falls and myalgias.  Neurological:  Negative for dizziness and headaches.      Objective  Vitals:   12/16/22 1259 12/16/22 1303  BP: (!) 160/84 136/82  Pulse: 83   Resp: 16   Temp: 97.9 F (36.6 C)   SpO2: 96%   Weight: 169 lb 3.2 oz (76.7 kg)   Height: 5\' 6"  (1.676 m)     Body mass index is 27.31 kg/m.  Physical Exam Vitals reviewed.  Constitutional:      General: She is awake.     Appearance: Normal appearance. She is well-developed and well-groomed.  HENT:     Head: Normocephalic and atraumatic.  Pulmonary:     Effort: Pulmonary effort is normal.  Musculoskeletal:     Cervical back: Normal range of motion.  Skin:    General: Skin is warm and dry.   Neurological:     General: No focal deficit present.     Mental Status: She is alert and oriented to person, place, and time.  Psychiatric:        Mood and Affect: Mood normal.        Behavior: Behavior normal. Behavior is cooperative.      Recent Results (from the past 2160 hour(s))  POCT HgB A1C     Status: Abnormal   Collection Time: 10/23/22 11:40 AM  Result Value Ref Range   Hemoglobin A1C 5.8 (A) 4.0 - 5.6 %   HbA1c POC (<> result, manual entry)     HbA1c, POC (prediabetic range)     HbA1c, POC (controlled diabetic range)       PHQ2/9:    10/23/2022   11:33 AM 07/24/2022    1:49 PM 05/28/2022    8:26 AM 05/15/2022   10:25 AM 01/28/2022    1:33 PM  Depression screen PHQ 2/9  Decreased Interest 2 0 0 0 2  Down, Depressed, Hopeless 1 0 0 0 3  PHQ - 2 Score 3 0 0 0 5  Altered sleeping 0 0 0 0 0  Tired, decreased energy 0 0 0 0 3  Change in appetite 3 0 0 0 3  Feeling bad or failure about yourself  0 0 0 0 3  Trouble concentrating 0 0 0 0 3  Moving slowly or fidgety/restless 0 0 0 0 3  Suicidal thoughts 0 0 0 0 3  PHQ-9 Score 6 0 0 0 23  Difficult doing work/chores  Not difficult at all Not difficult at all Not difficult at all Somewhat difficult      Fall Risk:    10/23/2022   11:33 AM 07/24/2022    1:49 PM 05/28/2022    8:26 AM 05/15/2022   10:25 AM 01/28/2022    1:23 PM  Fall Risk   Falls in the past year? 0 0 0 0 0  Number falls in past yr: 0 0 0 0   Injury with Fall? 0 0 0 0   Risk for fall due to : No Fall Risks No Fall Risks No Fall Risks No Fall Risks No Fall Risks  Follow up Falls prevention discussed Falls prevention discussed;Education provided;Falls evaluation completed Falls prevention discussed;Education provided;Falls evaluation completed Falls prevention discussed;Education provided;Falls evaluation completed Falls prevention discussed      Functional Status Survey:      Assessment & Plan  Problem List Items Addressed This Visit        Endocrine   Type 2 diabetes mellitus with obesity (HCC)    Chronic, historic condition A1c was previously checked in May - was 5.8 She reports needing a refill of Metformin as her mail order pharmacy states she does not have scripts on file  Refills sent per her request  Recommend she follow up in 2 months with PCP for her regular monitoring or sooner if concerns arise       Relevant Medications   metFORMIN (GLUCOPHAGE-XR) 750 MG 24 hr tablet     Return in 2 months (on 02/15/2023) for HTN, DM, weight discussion.   I, Kiyo Heal E Argel Pablo, PA-C, have reviewed all documentation for this visit. The documentation on 12/16/22 for the exam, diagnosis, procedures, and orders are all accurate and complete.   Jacquelin Hawking, MHS, PA-C [facility]

## 2022-12-16 NOTE — Assessment & Plan Note (Signed)
Chronic, historic condition A1c was previously checked in May - was 5.8 She reports needing a refill of Metformin as her mail order pharmacy states she does not have scripts on file  Refills sent per her request  Recommend she follow up in 2 months with PCP for her regular monitoring or sooner if concerns arise

## 2022-12-17 ENCOUNTER — Ambulatory Visit
Admission: RE | Admit: 2022-12-17 | Discharge: 2022-12-17 | Disposition: A | Discharge status: Home / Self Care | Payer: Medicare Other | Source: Ambulatory Visit | Attending: Family Medicine | Admitting: Family Medicine

## 2022-12-17 DIAGNOSIS — Z1231 Encounter for screening mammogram for malignant neoplasm of breast: Secondary | ICD-10-CM | POA: Insufficient documentation

## 2022-12-20 ENCOUNTER — Telehealth: Payer: Self-pay | Admitting: Family Medicine

## 2022-12-20 NOTE — Telephone Encounter (Signed)
Pt is calling in because she was told by [facility] that they needed an explanation on how to administer the medication glipiZIDE (GLUCOTROL XL) 5 MG 24 hr tablet [[id number]] to the pt. Pt says she was taking both 2.5 MG and the 5 MG and now is only supposed to be taking the 5 MG. Please advise.

## 2022-12-20 NOTE — Telephone Encounter (Signed)
glipiZIDE (GLUCOTROL XL) 5 MG 24 hr tablet 90 tablet 1 07/24/2022   Sig:   Take 1 tablet (5 mg total) by mouth every morning.    Route:   Oral     Left voicemail to relay SIG for glipizide per EPIC/provider notes and RX that was sent in 07/24/2022

## 2022-12-20 NOTE — Telephone Encounter (Signed)
Pt called back saying that she has been taking the Glipizide ER and they need clarification on what she is supposed to be taking.  Please advise [phone]  [phone]

## 2022-12-23 NOTE — Telephone Encounter (Signed)
5mg  ER #90 no refills was processed by [facility].

## 2022-12-24 ENCOUNTER — Ambulatory Visit: Payer: Medicare Other | Admitting: Podiatry

## 2022-12-24 ENCOUNTER — Ambulatory Visit (INDEPENDENT_AMBULATORY_CARE_PROVIDER_SITE_OTHER): Payer: Medicare Other

## 2022-12-24 DIAGNOSIS — M79671 Pain in right foot: Secondary | ICD-10-CM

## 2022-12-24 DIAGNOSIS — M10071 Idiopathic gout, right ankle and foot: Secondary | ICD-10-CM

## 2022-12-24 NOTE — Progress Notes (Signed)
Chief Complaint  Patient presents with   Foot Pain    Patient came in today for right foot pain, top of the foot, started a month ago, rate of pain 5 out of 10, sharp pain, X-Rays taken today Diabetic A1c- 5.8 BG-145    HPI: 77 y.o. female presenting today for evaluation of right great toe pain that began on 12/07/2022 in the middle the night.  Idiopathic onset.  She does have a history of gout.  She says that her father had chronic episodes of gout.  Over the last 3 weeks there is been significant improvement.  Past Medical History:  Diagnosis Date   Achilles tendinitis of right lower extremity    Allergy    Asthma    Moderate   Chronic back pain    Depression    Diabetes mellitus without complication (HCC)    Gastroparesis    DM   Hyperlipidemia    Hypertension    Hypoxemia    Irritable bowel syndrome    Maculopathy    NASH (nonalcoholic steatohepatitis)    Nuclear sclerosis of both eyes    OA (osteoarthritis)    Hips   OSA (obstructive sleep apnea)    CPAP   Post-void dribbling    Psoriasis    Regurgitation    SOB (shortness of breath)    Tear of left rotator cuff    TMJ (dislocation of temporomandibular joint)    Trochanteric bursitis of right hip    Wears hearing aid in both ears     Past Surgical History:  Procedure Laterality Date   ABDOMINAL HYSTERECTOMY     APPENDECTOMY     ARTERY BIOPSY N/A 02/23/2015   Procedure: BIOPSY TEMPORAL ARTERY;  Surgeon: Annice Needy, MD;  Location: [facility];  Service: Vascular;  Laterality: N/A;   BREAST BIOPSY Left    1990's   CATARACT EXTRACTION W/PHACO Left 07/24/2021   Procedure: CATARACT EXTRACTION PHACO AND INTRAOCULAR LENS PLACEMENT (IOC) LEFT DIABETIC 8.37 00:50.0;  Surgeon: Galen Manila, MD;  Location: [facility];  Service: Ophthalmology;  Laterality: Left;  Diabetic   CATARACT EXTRACTION W/PHACO Right 08/07/2021   Procedure: CATARACT EXTRACTION PHACO AND INTRAOCULAR LENS PLACEMENT (IOC) RIGHT DIABETIC 7.29  00:48.4;  Surgeon: Galen Manila, MD;  Location: [facility];  Service: Ophthalmology;  Laterality: Right;  Diabetic   EXCISION / CURETTAGE BONE CYST FINGER     Left Index Finger   PILONIDAL CYST EXCISION     REVERSE SHOULDER ARTHROPLASTY Left 08/27/2022   Procedure: REVERSE SHOULDER ARTHROPLASTY WITH BICEPS TENODESIS.- RNFA;  Surgeon: Christena Flake, MD;  Location: [facility];  Service: Orthopedics;  Laterality: Left;   SHOULDER ARTHROSCOPY Right [id number]   Dr. Gerrit Heck   STOMACH SURGERY  1941   3 arteries leaking in stomach after appendectomy   TONSILLECTOMY      Allergies  Allergen Reactions   Doxycycline Other (See Comments)    Weakness, flu like symptoms   Codeine     Dizzy   Lantus  [Insulin Glargine]     made skin hot   Oxycodone     Dizziness   Rosuvastatin     Myalgia    Penicillin G Rash    And dizziness   Sitagliptin Rash   Sulfa Antibiotics Rash    Other reaction(s):   Red burning skin   Wound Dressing Adhesive Rash    Surgical tape adhesive     Physical Exam: General: The patient is alert and oriented x3  in no acute distress.  Dermatology: Skin is warm, dry and supple bilateral lower extremities.   Vascular: Palpable pedal pulses bilaterally. Capillary refill within normal limits.  No appreciable edema.  No erythema.  Neurological: Grossly intact via light touch  Musculoskeletal Exam: No pedal deformities noted.  Minimal tenderness with palpation and range of motion of the first MTP of the right foot  Radiographic Exam RT foot 12/24/2022:  Normal osseous mineralization. Joint spaces preserved.  No fractures or osseous irregularities noted.  Impression: Negative  Assessment/Plan of Care: 1.  Acute gout right first MTP; onset 12/07/2022  -Patient evaluated.  X-rays reviewed -Over the last few weeks has been significant improvement and reduction of the pain and swelling to the great toe joint. -Continue wearing good supportive tennis shoes and  sneakers -Recommend educating herself on foods to avoid associated with gout -Return to clinic as needed       Felecia Shelling, DPM [facility]  Dr. Felecia Shelling, DPM    [address] [place], [place] [address]                Office [phone]  Fax [phone]

## 2023-01-07 ENCOUNTER — Other Ambulatory Visit: Payer: Self-pay

## 2023-01-07 DIAGNOSIS — I10 Essential (primary) hypertension: Secondary | ICD-10-CM

## 2023-01-08 LAB — BASIC METABOLIC PANEL
BUN: 17 (ref 4–21)
CO2: 24 — AB (ref 13–22)
Chloride: 103 (ref 99–108)
Creatinine: 0.8 (ref 0.5–1.1)
Glucose: 78
Potassium: 4 mEq/L (ref 3.5–5.1)
Sodium: 144 (ref 137–147)

## 2023-01-08 LAB — CBC AND DIFFERENTIAL
HCT: 39 (ref 36–46)
Neutrophils Absolute: 7.8
Platelets: 321 10*3/uL (ref 150–400)
WBC: 10

## 2023-01-08 LAB — COMPREHENSIVE METABOLIC PANEL
Albumin: 4.4 (ref 3.5–5.0)
Calcium: 9.6 (ref 8.7–10.7)
Globulin: 1.8
eGFR: 75

## 2023-01-08 LAB — HEPATIC FUNCTION PANEL
ALT: 14 U/L (ref 7–35)
AST: 21 (ref 13–35)
Alkaline Phosphatase: 92 (ref 25–125)
Bilirubin, Total: 0.4

## 2023-01-08 LAB — CBC: RBC: 4.44 (ref 3.87–5.11)

## 2023-01-08 MED ORDER — OLMESARTAN MEDOXOMIL 40 MG PO TABS: 40.0000 mg | ORAL_TABLET | Freq: Every day | ORAL | 1 refills | Status: DC

## 2023-01-12 ENCOUNTER — Other Ambulatory Visit: Payer: Self-pay | Admitting: Family Medicine

## 2023-01-14 ENCOUNTER — Encounter: Payer: Self-pay | Admitting: Family Medicine

## 2023-02-14 ENCOUNTER — Other Ambulatory Visit: Payer: Self-pay | Admitting: Podiatry

## 2023-02-14 DIAGNOSIS — M79671 Pain in right foot: Secondary | ICD-10-CM

## 2023-02-14 DIAGNOSIS — M10071 Idiopathic gout, right ankle and foot: Secondary | ICD-10-CM

## 2023-02-18 ENCOUNTER — Ambulatory Visit: Payer: Medicare Other | Admitting: Family Medicine

## 2023-02-26 ENCOUNTER — Other Ambulatory Visit: Payer: Self-pay | Admitting: Family Medicine

## 2023-02-26 DIAGNOSIS — K580 Irritable bowel syndrome with diarrhea: Secondary | ICD-10-CM

## 2023-04-23 ENCOUNTER — Other Ambulatory Visit: Payer: Self-pay

## 2023-04-23 ENCOUNTER — Ambulatory Visit (INDEPENDENT_AMBULATORY_CARE_PROVIDER_SITE_OTHER): Payer: Medicare Other | Admitting: Nurse Practitioner

## 2023-04-23 ENCOUNTER — Encounter: Payer: Self-pay | Admitting: Nurse Practitioner

## 2023-04-23 VITALS — BP 138/82 | HR 84 | Temp 98.7°F | Resp 16 | Ht 66.0 in | Wt 173.5 lb

## 2023-04-23 DIAGNOSIS — J4520 Mild intermittent asthma, uncomplicated: Secondary | ICD-10-CM

## 2023-04-23 DIAGNOSIS — F331 Major depressive disorder, recurrent, moderate: Secondary | ICD-10-CM | POA: Insufficient documentation

## 2023-04-23 DIAGNOSIS — I10 Essential (primary) hypertension: Secondary | ICD-10-CM | POA: Diagnosis not present

## 2023-04-23 DIAGNOSIS — Z7984 Long term (current) use of oral hypoglycemic drugs: Secondary | ICD-10-CM

## 2023-04-23 DIAGNOSIS — E1169 Type 2 diabetes mellitus with other specified complication: Secondary | ICD-10-CM | POA: Diagnosis not present

## 2023-04-23 DIAGNOSIS — E669 Obesity, unspecified: Secondary | ICD-10-CM | POA: Diagnosis not present

## 2023-04-23 DIAGNOSIS — J309 Allergic rhinitis, unspecified: Secondary | ICD-10-CM | POA: Insufficient documentation

## 2023-04-23 DIAGNOSIS — E785 Hyperlipidemia, unspecified: Secondary | ICD-10-CM

## 2023-04-23 MED ORDER — DIPHENHYDRAMINE HCL 25 MG PO CAPS: 25.0000 mg | ORAL_CAPSULE | Freq: Every evening | ORAL | 1 refills | Status: DC | PRN

## 2023-04-23 NOTE — Assessment & Plan Note (Signed)
Continue Singulair 10 mg daily Symbicort 2 puffs each morning.

## 2023-04-23 NOTE — Assessment & Plan Note (Signed)
Requesting refill of Benadryl, refill  sent in.  Continue taking singular 10 mg daily, Astelin 1-2 nasal sprays 2 times daily

## 2023-04-23 NOTE — Assessment & Plan Note (Signed)
Patient in remission.  Not currently on medication.

## 2023-04-23 NOTE — Assessment & Plan Note (Signed)
Continue taking olmesartan 40 mg daily.  Patient does not check her blood pressure at home.

## 2023-04-23 NOTE — Progress Notes (Signed)
BP 138/82   Pulse 84   Temp 98.7 F (37.1 C) (Oral)   Resp 16   Ht 5\' 6"  (1.676 m)   Wt 173 lb 8 oz (78.7 kg)   SpO2 98%   BMI 28.00 kg/m    Subjective:    Patient ID: Mary Cantrell, female    DOB: January 05, 1946, 77 y.o.   MRN: [id number]  HPI: Mary Cantrell is a 77 y.o. female  Chief Complaint  Patient presents with   Medical Management of Chronic Issues   Diabetes, Type 2:  -Last A1c 5.8 -Medications: metformin 1500 mg daily, glipizide 5 mg daily,  -Patient is compliant with the above medications and reports no side effects.  -Checking BG at home: has not been checking it -Diet: reduce sugar and processed foods in your diet  -Exercise: recommend 150 min of physical activity weekly   -Eye exam: due -Foot exam: utd -Microalbumin: due -Statin: yes -PNA vaccine: utd -Denies symptoms of hypoglycemia, polyuria, polydipsia, numbness extremities, foot ulcers/trauma.    Hypertension:  -Medications: olmesartan 40 mg daily -Patient is compliant with above medications and reports no side effects. -Checking BP at home (average): does not check at home -Denies any SOB, CP, vision changes, LE edema or symptoms of hypotension -Diet: recommend DASH diet  -Exercise: recommend 150 min of physical activity weekly       04/23/2023    2:17 PM 12/16/2022    1:03 PM 12/16/2022   12:59 PM  Vitals with BMI  Height 5\' 6"   5\' 6"   Weight 173 lbs 8 oz  169 lbs 3 oz  BMI 28.02  27.32  Systolic 138 136 595  Diastolic 82 82 84  Pulse 84  83    HLD:  -Medications: pravastatin 40 mg daily -Patient is compliant with above medications and reports no side effects.  -Last lipid panel:   Lipid Panel     Component Value Date/Time   CHOL 176 01/28/2022 1405   CHOL 177 02/02/2020 1310   TRIG 104 01/28/2022 1405   HDL 73 01/28/2022 1405   HDL 69 02/02/2020 1310   CHOLHDL 2.4 01/28/2022 1405   LDLCALC 83 01/28/2022 1405   LABVLDL 31 02/02/2020 1310   The 10-year ASCVD risk score (Arnett DK,  et al., 2019) is: 54.3%   Values used to calculate the score:     Age: 68 years     Sex: Female     Is Non-Hispanic African American: No     Diabetic: Yes     Tobacco smoker: No     Systolic Blood Pressure: 138 mmHg     Is BP treated: Yes     HDL Cholesterol: 73 mg/dL     Total Cholesterol: 176 mg/dL   Depression Medication not on medication Compliant n/a Side effects n/a PHQ9 negative GAD negative     04/23/2023    2:30 PM 04/23/2023    2:23 PM 10/23/2022   11:33 AM 07/24/2022    1:49 PM 05/28/2022    8:26 AM  Depression screen PHQ 2/9  Decreased Interest 0 0 2 0 0  Down, Depressed, Hopeless 0 0 1 0 0  PHQ - 2 Score 0 0 3 0 0  Altered sleeping 0  0 0 0  Tired, decreased energy 0  0 0 0  Change in appetite 0  3 0 0  Feeling bad or failure about yourself  0  0 0 0  Trouble concentrating 0  0 0 0  Moving  slowly or fidgety/restless 0  0 0 0  Suicidal thoughts 0  0 0 0  PHQ-9 Score 0  6 0 0  Difficult doing work/chores Not difficult at all   Not difficult at all Not difficult at all       04/23/2023    2:30 PM 01/28/2022    1:34 PM  GAD 7 : Generalized Anxiety Score  Nervous, Anxious, on Edge 0 3  Control/stop worrying 0 3  Worry too much - different things 0 3  Trouble relaxing 0 2  Restless 0 2  Easily annoyed or irritable 0 3  Afraid - awful might happen 0 3  Total GAD 7 Score 0 19  Anxiety Difficulty Not difficult at all Somewhat difficult    Allergic rhinitis/asthma: Reports she needs a refill of her Benadryl.  Takes Singulair 10 mg daily and Astelin 1-2 nasal sprays 2 times daily.  Symbicort 2 puffs each morning.  Patient reports that her symptoms are well-controlled with her medications.  Relevant past medical, surgical, family and social history reviewed and updated as indicated. Interim medical history since our last visit reviewed. Allergies and medications reviewed and updated.  Review of Systems  Constitutional: Negative for fever or weight change.   Respiratory: Negative for cough and shortness of breath.   Cardiovascular: Negative for chest pain or palpitations.  Gastrointestinal: Negative for abdominal pain, no bowel changes.  Musculoskeletal: Negative for gait problem or joint swelling.  Skin: Negative for rash.  Neurological: Negative for dizziness or headache.  No other specific complaints in a complete review of systems (except as listed in HPI above).      Objective:    BP 138/82   Pulse 84   Temp 98.7 F (37.1 C) (Oral)   Resp 16   Ht 5\' 6"  (1.676 m)   Wt 173 lb 8 oz (78.7 kg)   SpO2 98%   BMI 28.00 kg/m   Wt Readings from Last 3 Encounters:  04/23/23 173 lb 8 oz (78.7 kg)  12/16/22 169 lb 3.2 oz (76.7 kg)  10/23/22 171 lb (77.6 kg)    Physical Exam  Constitutional: Patient appears well-developed and well-nourished. No distress.  HEENT: head atraumatic, normocephalic, pupils equal and reactive to light, ears TMs clear, neck supple, throat within normal limits Cardiovascular: Normal rate, regular rhythm and normal heart sounds.  No murmur heard. No BLE edema. Pulmonary/Chest: Effort normal and breath sounds normal. No respiratory distress. Abdominal: Soft.  There is no tenderness. Psychiatric: Patient has a normal mood and affect. behavior is normal. Judgment and thought content normal.  Results for orders placed or performed in visit on 01/13/23  CBC and differential  Result Value Ref Range   HCT 39 36 - 46   Neutrophils Absolute 7.80    Platelets 321 150 - 400 K/uL   WBC 10.0   CBC  Result Value Ref Range   RBC 4.44 3.87 - 5.11  Basic metabolic panel  Result Value Ref Range   Glucose 78    BUN 17 4 - 21   CO2 24 (A) 13 - 22   Creatinine 0.8 0.5 - 1.1   Potassium 4.0 3.5 - 5.1 mEq/L   Sodium 144 137 - 147   Chloride 103 99 - 108  Comprehensive metabolic panel  Result Value Ref Range   Globulin 1.8    eGFR 75    Calcium 9.6 8.7 - 10.7   Albumin 4.4 3.5 - 5.0  Hepatic function panel  Result  Value Ref Range   Alkaline Phosphatase 92 25 - 125   ALT 14 7 - 35 U/L   AST 21 13 - 35   Bilirubin, Total 0.4       Assessment & Plan:   Problem List Items Addressed This Visit       Cardiovascular and Mediastinum   Hypertension, benign    Continue taking olmesartan 40 mg daily.  Patient does not check her blood pressure at home.      Relevant Orders   COMPLETE METABOLIC PANEL WITH GFR   CBC with Differential/Platelet     Respiratory   Mild intermittent asthma without complication    Continue Singulair 10 mg daily Symbicort 2 puffs each morning.      Allergic sinusitis    Requesting refill of Benadryl, refill  sent in.  Continue taking singular 10 mg daily, Astelin 1-2 nasal sprays 2 times daily      Relevant Medications   diphenhydrAMINE (BENADRYL) 25 mg capsule     Endocrine   Dyslipidemia associated with type 2 diabetes mellitus (HCC)    Pravastatin 40 mg daily.  Patient states she tolerates this well.      Relevant Orders   COMPLETE METABOLIC PANEL WITH GFR   Lipid panel   Type 2 diabetes mellitus with obesity (HCC) - Primary    Continue taking metformin 1500 mg daily and glipizide 5 mg daily.  Patient states she has not been checking her blood sugar at home.  Recommend she check it at least once a week.      Relevant Orders   Microalbumin / creatinine urine ratio   Ambulatory referral to Optometry   COMPLETE METABOLIC PANEL WITH GFR   Hemoglobin A1c     Other   Moderate episode of recurrent major depressive disorder (HCC)    Patient in remission.  Not currently on medication.        Follow up plan: Return in about 4 months (around 08/22/2023) for follow up with Dr. Carlynn Purl.

## 2023-04-23 NOTE — Assessment & Plan Note (Signed)
Pravastatin 40 mg daily.  Patient states she tolerates this well.

## 2023-04-23 NOTE — Assessment & Plan Note (Signed)
Continue taking metformin 1500 mg daily and glipizide 5 mg daily.  Patient states she has not been checking her blood sugar at home.  Recommend she check it at least once a week.

## 2023-04-24 ENCOUNTER — Telehealth: Payer: Self-pay | Admitting: Family Medicine

## 2023-04-24 LAB — MICROALBUMIN / CREATININE URINE RATIO
Creatinine, Urine: 78 mg/dL (ref 20–275)
Microalb Creat Ratio: 55 mg/g{creat} — ABNORMAL HIGH (ref <30)
Microalb, Ur: 4.3 mg/dL

## 2023-04-24 LAB — LIPID PANEL
Cholesterol: 165 mg/dL (ref <200)
HDL: 71 mg/dL (ref >=50)
LDL Cholesterol (Calc): 73 mg/dL
Non-HDL Cholesterol (Calc): 94 mg/dL (ref <130)
Total CHOL/HDL Ratio: 2.3 (calc) (ref <5.0)
Triglycerides: 120 mg/dL (ref <150)

## 2023-04-24 LAB — COMPLETE METABOLIC PANEL WITH GFR
AG Ratio: 2 (calc) (ref 1.0–2.5)
ALT: 15 U/L (ref 6–29)
AST: 21 U/L (ref 10–35)
Albumin: 4.3 g/dL (ref 3.6–5.1)
Alkaline phosphatase (APISO): 73 U/L (ref 37–153)
BUN: 17 mg/dL (ref 7–25)
CO2: 29 mmol/L (ref 20–32)
Calcium: 9.5 mg/dL (ref 8.6–10.4)
Chloride: 104 mmol/L (ref 98–110)
Creat: 0.87 mg/dL (ref 0.60–1.00)
Globulin: 2.1 g/dL (ref 1.9–3.7)
Glucose, Bld: 112 mg/dL — ABNORMAL HIGH (ref 65–99)
Potassium: 3.4 mmol/L — ABNORMAL LOW (ref 3.5–5.3)
Sodium: 143 mmol/L (ref 135–146)
Total Bilirubin: 0.6 mg/dL (ref 0.2–1.2)
Total Protein: 6.4 g/dL (ref 6.1–8.1)
eGFR: 68 mL/min/{1.73_m2} (ref >=60)

## 2023-04-24 LAB — HEMOGLOBIN A1C
Hgb A1c MFr Bld: 5.9 %{Hb} — ABNORMAL HIGH (ref <5.7)
Mean Plasma Glucose: 123 mg/dL
eAG (mmol/L): 6.8 mmol/L

## 2023-04-24 LAB — CBC WITH DIFFERENTIAL/PLATELET
Absolute Lymphocytes: 1035 {cells}/uL (ref 850–3900)
Absolute Monocytes: 418 {cells}/uL (ref 200–950)
Basophils Absolute: 70 {cells}/uL (ref 0–200)
Basophils Relative: 0.8 %
Eosinophils Absolute: 87 {cells}/uL (ref 15–500)
Eosinophils Relative: 1 %
HCT: 39.1 % (ref 35.0–45.0)
Hemoglobin: 13.2 g/dL (ref 11.7–15.5)
MCH: 32.3 pg (ref 27.0–33.0)
MCHC: 33.8 g/dL (ref 32.0–36.0)
MCV: 95.6 fL (ref 80.0–100.0)
MPV: 10.4 fL (ref 7.5–12.5)
Monocytes Relative: 4.8 %
Neutro Abs: 7091 {cells}/uL (ref 1500–7800)
Neutrophils Relative %: 81.5 %
Platelets: 314 10*3/uL (ref 140–400)
RBC: 4.09 10*6/uL (ref 3.80–5.10)
RDW: 14 % (ref 11.0–15.0)
Total Lymphocyte: 11.9 %
WBC: 8.7 10*3/uL (ref 3.8–10.8)

## 2023-04-24 NOTE — Telephone Encounter (Signed)
Pt is calling upset to request that all orders placed for budesonide-formoterol [facility]) 160-4.5 MCG/ACT inhaler [[id number]]  ENDED  be stopped. Patient expressed that this is costing her a fortune. Please advise CB- [phone]

## 2023-04-25 ENCOUNTER — Telehealth: Payer: Self-pay | Admitting: Family Medicine

## 2023-04-25 NOTE — Telephone Encounter (Signed)
Called patient, but the medication in question is not prescribed by Dr. Carlynn Purl. Patient verbalized understanding.

## 2023-04-25 NOTE — Telephone Encounter (Signed)
Pt is calling in because she was in the office on 04/23/23 and she received a notification from [facility] telling her what medications were about to sent to her. Pt says she instructed [facility] to hold the prescriptions until next week. Pt says the order that is being sent from [facility] is the wrong med order and she wanted to speak with Dr. Carlynn Purl or her nurse about it. Please advise.

## 2023-05-27 ENCOUNTER — Other Ambulatory Visit: Payer: Self-pay | Admitting: Family Medicine

## 2023-05-27 DIAGNOSIS — E1169 Type 2 diabetes mellitus with other specified complication: Secondary | ICD-10-CM

## 2023-06-16 ENCOUNTER — Other Ambulatory Visit: Payer: Self-pay | Admitting: Family Medicine

## 2023-06-16 DIAGNOSIS — I10 Essential (primary) hypertension: Secondary | ICD-10-CM

## 2023-07-15 ENCOUNTER — Telehealth: Payer: Self-pay | Admitting: Family Medicine

## 2023-07-15 ENCOUNTER — Other Ambulatory Visit: Payer: Self-pay | Admitting: Family Medicine

## 2023-07-15 DIAGNOSIS — I10 Essential (primary) hypertension: Secondary | ICD-10-CM

## 2023-07-15 MED ORDER — OLMESARTAN MEDOXOMIL 40 MG PO TABS: 40.0000 mg | ORAL_TABLET | Freq: Every day | ORAL | 1 refills | Status: DC

## 2023-07-15 NOTE — Telephone Encounter (Signed)
Medication Refill -  Most Recent Primary Care Visit:  Provider: Della Goo F  Department: [facility]  Visit Type: OFFICE VISIT  Date: 04/23/2023  Medication:  olmesartan (BENICAR) 40 MG tablet   Has the patient contacted their pharmacy? No  Is this the correct pharmacy for this prescription? Yes  This is the patient's preferred pharmacy:  [facility] - [place], [place] - [address] [place] [address] Phone: [phone] Fax: [phone]  Has the prescription been filled recently? Yes  Is the patient out of the medication? Yes  Has the patient been seen for an appointment in the last year OR does the patient have an upcoming appointment? Yes  Can we respond through MyChart? No  Agent: Please be advised that Rx refills may take up to 3 business days. We ask that you follow-up with your pharmacy.

## 2023-07-15 NOTE — Telephone Encounter (Signed)
There is already a call today regarding refilling this medication.

## 2023-07-15 NOTE — Telephone Encounter (Signed)
Medication Refill -  Most Recent Primary Care Visit:  Provider: Della Goo F  Department: [facility]  Visit Type: OFFICE VISIT  Date: 04/23/2023  Medication: Olmesartan  Has the patient contacted their pharmacy? No (Agent: If no, request that the patient contact the pharmacy for the refill. If patient does not wish to contact the pharmacy document the reason why and proceed with request.) (Agent: If yes, when and what did the pharmacy advise?)  Is this the correct pharmacy for this prescription? Yes  If no, delete pharmacy and type the correct one.   [facility]  This is the patient's preferred pharmacy:   Has the prescription been filled recently? Yes  Is the patient out of the medication? Yes  Has the patient been seen for an appointment in the last year OR does the patient have an upcoming appointment? Yes  Can we respond through MyChart? No  Agent: Please be advised that Rx refills may take up to 3 business days. We ask that you follow-up with your pharmacy.

## 2023-07-15 NOTE — Telephone Encounter (Signed)
Requested Prescriptions  Pending Prescriptions Disp Refills   olmesartan (BENICAR) 40 MG tablet 90 tablet 1    Sig: Take 1 tablet (40 mg total) by mouth daily. In place of losartan     Cardiovascular:  Angiotensin Receptor Blockers Failed - 07/15/2023  2:27 PM      Failed - K in normal range and within 180 days    Potassium  Date Value Ref Range Status  04/23/2023 3.4 (L) 3.5 - 5.3 mmol/L Final  12/31/2011 3.5 3.5 - 5.1 mmol/L Final         Passed - Cr in normal range and within 180 days    Creat  Date Value Ref Range Status  04/23/2023 0.87 0.60 - 1.00 mg/dL Final   Creatinine, Urine  Date Value Ref Range Status  04/23/2023 78 20 - 275 mg/dL Final         Passed - Patient is not pregnant      Passed - Last BP in normal range    BP Readings from Last 1 Encounters:  04/23/23 138/82         Passed - Valid encounter within last 6 months    Recent Outpatient Visits           2 months ago Type 2 diabetes mellitus with obesity [facility])   [place] [facility] Berniece Salines, FNP   7 months ago Type 2 diabetes mellitus with obesity [facility])   [place] [facility] Mecum, Erin E, PA-C   8 months ago Dyslipidemia associated with type 2 diabetes mellitus [facility])   [place] [facility] Alba Cory, MD   11 months ago Dyslipidemia associated with type 2 diabetes mellitus [facility])   [place] [facility] Alba Cory, MD   1 year ago Cough, unspecified type   [facility] Mecum, Oswaldo [place], [place]       Future Appointments             In 1 month Alba Cory, MD [facility], [facility]

## 2023-08-19 ENCOUNTER — Ambulatory Visit (INDEPENDENT_AMBULATORY_CARE_PROVIDER_SITE_OTHER): Payer: Self-pay | Admitting: Family Medicine

## 2023-08-19 ENCOUNTER — Encounter: Payer: Self-pay | Admitting: Family Medicine

## 2023-08-19 VITALS — BP 172/94 | HR 98 | Resp 16 | Ht 66.0 in | Wt 177.2 lb

## 2023-08-19 DIAGNOSIS — E785 Hyperlipidemia, unspecified: Secondary | ICD-10-CM

## 2023-08-19 DIAGNOSIS — M353 Polymyalgia rheumatica: Secondary | ICD-10-CM

## 2023-08-19 DIAGNOSIS — E669 Obesity, unspecified: Secondary | ICD-10-CM

## 2023-08-19 DIAGNOSIS — E1169 Type 2 diabetes mellitus with other specified complication: Secondary | ICD-10-CM | POA: Diagnosis not present

## 2023-08-19 DIAGNOSIS — I10 Essential (primary) hypertension: Secondary | ICD-10-CM

## 2023-08-19 DIAGNOSIS — N1831 Chronic kidney disease, stage 3a: Secondary | ICD-10-CM

## 2023-08-19 DIAGNOSIS — F331 Major depressive disorder, recurrent, moderate: Secondary | ICD-10-CM

## 2023-08-19 DIAGNOSIS — I129 Hypertensive chronic kidney disease with stage 1 through stage 4 chronic kidney disease, or unspecified chronic kidney disease: Secondary | ICD-10-CM

## 2023-08-19 DIAGNOSIS — L4050 Arthropathic psoriasis, unspecified: Secondary | ICD-10-CM

## 2023-08-19 DIAGNOSIS — G4733 Obstructive sleep apnea (adult) (pediatric): Secondary | ICD-10-CM

## 2023-08-19 DIAGNOSIS — J4520 Mild intermittent asthma, uncomplicated: Secondary | ICD-10-CM

## 2023-08-19 DIAGNOSIS — Z7984 Long term (current) use of oral hypoglycemic drugs: Secondary | ICD-10-CM

## 2023-08-19 LAB — POCT GLYCOSYLATED HEMOGLOBIN (HGB A1C): Hemoglobin A1C: 7.3 % — AB (ref 4.0–5.6)

## 2023-08-19 MED ORDER — GLIPIZIDE ER 5 MG PO TB24: 5.0000 mg | ORAL_TABLET | Freq: Every morning | ORAL | 1 refills | Status: DC

## 2023-08-19 MED ORDER — MONTELUKAST SODIUM 10 MG PO TABS: 10.0000 mg | ORAL_TABLET | Freq: Every day | ORAL | 1 refills | Status: DC

## 2023-08-19 MED ORDER — AMLODIPINE BESYLATE 2.5 MG PO TABS: 2.5000 mg | ORAL_TABLET | Freq: Every day | ORAL | 1 refills | Status: DC

## 2023-08-19 MED ORDER — PRAVASTATIN SODIUM 40 MG PO TABS: 40.0000 mg | ORAL_TABLET | Freq: Every day | ORAL | 1 refills | Status: DC

## 2023-08-19 MED ORDER — METFORMIN HCL ER 750 MG PO TB24: ORAL_TABLET | ORAL | 1 refills | Status: DC

## 2023-08-19 NOTE — Progress Notes (Signed)
 Name: Mary Cantrell   MRN: [id number]    DOB: Mar 01, 1946   Date:08/19/2023       Progress Note  Subjective  Chief Complaint  Chief Complaint  Patient presents with   Medical Management of Chronic Issues   HPI   DMII: A1C has gone up since last visit , today is 7.3 %, she has been taking Glipizide 5 mg daily but only one Metformin 750 mg daily and has not been following a diabetic diet since her dog died in dec. She is sad/grieving and has been eating ice cream most nights. She denies polyphagia or polydipsia but has urinary frequency that is chronic and stable   OSA: on CPAP : she is compliant , denies morning headaches   Hyperlipidemia: she is only able to tolerate pravastatin, last LDL was closer to goal , down to 73    Gout: diagnosed by Dr. Kathi Ludwig and is doing okay at this time.   Mild Intermittent Asthma: she used to see  Dr. Meredeth Ide , she is on Symbicort and albuterol prn, she has stable SOB denies wheezing or cough at this time. She needs refill of singulair   Hypertension : BP is out of control today, she states she has been taking Benicar , she thinks secondary to stress due to missing her dog. We will add norvasc 2.5 mg daily and return for bp check with CMA   Psoriasis: on her elbows, uses topical medication prn, no lesions at this time. She also has psoriatic arthritis and has pain on right hands   PMR: still under the care of Rheumatologist, Dr. Kathi Ludwig  She also has OA also psoriatic arthritis and is on methotrexate , she states unable to go down on the dose because pain increases, she also takes prednisone 5 mg daily to control symptoms     Tremors  she is under the care of Dr. Malvin Johns, she has tremors, balanced problems, sensory ataxia  she stopped taking Gabapentin because it caused fatigue. She states tremors are stable, uses a cane to help with gait. Unchanged   MDD recurrent: she states not sad, but she has a history of depression , refuses medication, she is grieving the  loss of her dog since Dec. Phq 9 is very high today  Patient Active Problem List   Diagnosis Date Noted   Allergic sinusitis 04/23/2023   Moderate episode of recurrent major depressive disorder (HCC) 04/23/2023   Type 2 diabetes mellitus with obesity (HCC) 12/16/2022   Mild intermittent asthma without complication 07/24/2022   Hypertension, benign 08/22/2021   Long term (current) use of systemic steroids 08/22/2021   Lumbar spondylosis 08/22/2021   Osteoarthritis 08/22/2021   DDD (degenerative disc disease), lumbar 10/24/2020   Neuropathy 02/27/2020   Sensory ataxia 02/27/2020   Tremor 02/27/2020   Chronic kidney disease, stage III (moderate) (HCC) 12/09/2018   Dyslipidemia associated with type 2 diabetes mellitus (HCC) 12/09/2018   Obesity (BMI 30.0-34.9) 07/17/2018   Hypertension associated with stage 2 chronic kidney disease due to type 2 diabetes mellitus (HCC) 03/15/2018   Psoriatic arthritis (HCC) 12/04/2017   Stenosis of left carotid artery 12/16/2016   Polymyalgia rheumatica (HCC) 04/13/2015   Back pain, chronic 12/21/2014   Narrowing of intervertebral disc space 12/21/2014   Diabetic gastroparesis (HCC) 12/21/2014   Amaurosis fugax of left eye 12/21/2014   H/O: hypothyroidism 12/21/2014   Hyperlipidemia 12/21/2014   IBS (irritable bowel syndrome) 12/21/2014   Disorder of macula of retina 12/21/2014   Asthma, moderate  12/21/2014   NASH (nonalcoholic steatohepatitis) 12/21/2014   NS (nuclear sclerosis) 12/21/2014   Osteoarthrosis 12/21/2014   Psoriasis 12/21/2014   Knee torn cartilage 12/21/2014   Obstructive apnea 01/03/2014   History of shoulder surgery 04/23/2011    Past Surgical History:  Procedure Laterality Date   ABDOMINAL HYSTERECTOMY     APPENDECTOMY     ARTERY BIOPSY N/A 02/23/2015   Procedure: BIOPSY TEMPORAL ARTERY;  Surgeon: Annice Needy, MD;  Location: [facility];  Service: Vascular;  Laterality: N/A;   BREAST BIOPSY Left    1990's   CATARACT  EXTRACTION W/PHACO Left 07/24/2021   Procedure: CATARACT EXTRACTION PHACO AND INTRAOCULAR LENS PLACEMENT (IOC) LEFT DIABETIC 8.37 00:50.0;  Surgeon: Galen Manila, MD;  Location: [facility];  Service: Ophthalmology;  Laterality: Left;  Diabetic   CATARACT EXTRACTION W/PHACO Right 08/07/2021   Procedure: CATARACT EXTRACTION PHACO AND INTRAOCULAR LENS PLACEMENT (IOC) RIGHT DIABETIC 7.29 00:48.4;  Surgeon: Galen Manila, MD;  Location: [facility];  Service: Ophthalmology;  Laterality: Right;  Diabetic   EXCISION / CURETTAGE BONE CYST FINGER     Left Index Finger   PILONIDAL CYST EXCISION     REVERSE SHOULDER ARTHROPLASTY Left 08/27/2022   Procedure: REVERSE SHOULDER ARTHROPLASTY WITH BICEPS TENODESIS.- RNFA;  Surgeon: Christena Flake, MD;  Location: [facility];  Service: Orthopedics;  Laterality: Left;   SHOULDER ARTHROSCOPY Right [id number]   Dr. Gerrit Heck   STOMACH SURGERY  1941   3 arteries leaking in stomach after appendectomy   TONSILLECTOMY      Family History  Problem Relation Age of Onset   Pneumonia Mother    Rheum arthritis Father    Diabetes Father    Pneumonia Father    Diabetes Sister    Osteoarthritis Sister    Breast cancer Cousin     Social History   Tobacco Use   Smoking status: Never   Smokeless tobacco: Never  Substance Use Topics   Alcohol use: No    Alcohol/week: 0.0 standard drinks of alcohol     Current Outpatient Medications:    aspirin 81 MG EC tablet, Take 1 tablet by mouth daily., Disp: , Rfl:    azelastine (ASTELIN) 0.1 % nasal spray, Place 1-2 sprays into both nostrils 2 (two) times daily. Use in each nostril as directed, Disp: , Rfl:    Calcium Carbonate-Vit D-Min ([facility] CALCIUM 600 + D/MINERALS) 600-400 MG-UNIT TABS, Take 1 tablet by mouth daily., Disp: , Rfl: 1   cholecalciferol (VITAMIN D3) 25 MCG (1000 UNIT) tablet, Take 1,000 Units by mouth daily., Disp: , Rfl:    Cinnamon 500 MG capsule, Take 2,000 mg by mouth daily., Disp: , Rfl:     cyanocobalamin 100 MCG tablet, Take 1,000 mcg by mouth 2 (two) times a week., Disp: , Rfl:    dicyclomine (BENTYL) 20 MG tablet, Take 1 tablet (20 mg total) by mouth 3 (three) times daily as needed for spasms., Disp: 360 tablet, Rfl: 0   diphenhydrAMINE (BENADRYL) 25 mg capsule, Take 1 capsule (25 mg total) by mouth at bedtime as needed., Disp: 90 capsule, Rfl: 1   fluticasone (FLONASE) 50 MCG/ACT nasal spray, USE 2 SPRAYS IN EACH NOSTRIL AT BEDTIME, Disp: 48 g, Rfl: 3   folic acid (FOLVITE) 1 MG tablet, Take 1 mg by mouth daily. , Disp: , Rfl:    glipiZIDE (GLUCOTROL XL) 5 MG 24 hr tablet, TAKE 1 TABLET EVERY MORNING, Disp: 90 tablet, Rfl: 0   glucose blood (ONETOUCH ULTRA) test  strip, 1 each by Other route daily. Use as instructed, Disp: 100 each, Rfl: 2   ibuprofen (ADVIL) 200 MG tablet, Take 200 mg by mouth 3 (three) times daily., Disp: , Rfl:    metFORMIN (GLUCOPHAGE-XR) 750 MG 24 hr tablet, TAKE 2 TABLETS (1,500 MG TOTAL) DAILY WITH BREAKFAST, Disp: 180 tablet, Rfl: 1   methotrexate (RHEUMATREX) 2.5 MG tablet, Take 20 mg by mouth once a week. Caution:Chemotherapy. Protect from light., Disp: , Rfl:    montelukast (SINGULAIR) 10 MG tablet, Take 1 tablet by mouth at bedtime., Disp: , Rfl:    Multiple Vitamins-Minerals (MULTIVITAMIN ADULTS PO), Take 1 tablet by mouth daily. , Disp: , Rfl:    olmesartan (BENICAR) 40 MG tablet, Take 1 tablet (40 mg total) by mouth daily. In place of losartan, Disp: 90 tablet, Rfl: 1   pravastatin (PRAVACHOL) 40 MG tablet, TAKE 1 TABLET DAILY (AWAITING LABS OF NEXT VISIT FOR FURTHER REFILLS), Disp: 90 tablet, Rfl: 0   prednisoLONE acetate (PRED FORTE) 1 % ophthalmic suspension, 1 drop 2 (two) times daily., Disp: , Rfl:    predniSONE (DELTASONE) 5 MG tablet, Take 5 mg by mouth daily., Disp: , Rfl:    vitamin C (ASCORBIC ACID) 500 MG tablet, Take 1 tablet by mouth at bedtime. , Disp: , Rfl:    budesonide-formoterol (SYMBICORT) 160-4.5 MCG/ACT inhaler, Inhale 2  puffs into the lungs every morning., Disp: , Rfl:   Allergies  Allergen Reactions   Doxycycline Other (See Comments)    Weakness, flu like symptoms   Codeine     Dizzy   Lantus  [Insulin Glargine]     made skin hot   Oxycodone     Dizziness   Rosuvastatin     Myalgia    Penicillin G Rash    And dizziness   Sitagliptin Rash   Sulfa Antibiotics Rash    Other reaction(s):   Red burning skin   Wound Dressing Adhesive Rash    Surgical tape adhesive    I personally reviewed active problem list, medication list, allergies, family history with the patient/caregiver today.   ROS  Ten systems reviewed and is negative except as mentioned in HPI I   Objective  Vitals:   08/19/23 1348  BP: (!) 198/102  Pulse: 98  Resp: 16  SpO2: 95%  Weight: 177 lb 3.2 oz (80.4 kg)  Height: 5\' 6"  (1.676 m)    Body mass index is 28.6 kg/m.  Physical Exam  Constitutional: Patient appears well-developed and well-nourished.  No distress.  HEENT: head atraumatic, normocephalic, pupils equal and reactive to light, neck supple Cardiovascular: Normal rate, regular rhythm and normal heart sounds.  No murmur heard. No BLE edema. Pulmonary/Chest: Effort normal and breath sounds normal. No respiratory distress. Abdominal: Soft.  There is no tenderness. Psychiatric: Patient has a normal mood and affect. behavior is normal. Judgment and thought content normal.   Recent Results (from the past 2160 hours)  POCT glycosylated hemoglobin (Hb A1C)     Status: Abnormal   Collection Time: 08/19/23  1:52 PM  Result Value Ref Range   Hemoglobin A1C 7.3 (A) 4.0 - 5.6 %   HbA1c POC (<> result, manual entry)     HbA1c, POC (prediabetic range)     HbA1c, POC (controlled diabetic range)      Diabetic Foot Exam:     PHQ2/9:    08/19/2023    1:47 PM 04/23/2023    2:30 PM 04/23/2023    2:23 PM 10/23/2022  11:33 AM 07/24/2022    1:49 PM  Depression screen PHQ 2/9  Decreased Interest 3 0 0 2 0  Down,  Depressed, Hopeless 3 0 0 1 0  PHQ - 2 Score 6 0 0 3 0  Altered sleeping 3 0  0 0  Tired, decreased energy 3 0  0 0  Change in appetite 3 0  3 0  Feeling bad or failure about yourself  0 0  0 0  Trouble concentrating 3 0  0 0  Moving slowly or fidgety/restless 0 0  0 0  Suicidal thoughts 0 0  0 0  PHQ-9 Score 18 0  6 0  Difficult doing work/chores Very difficult Not difficult at all   Not difficult at all    phq 9 is positive  Fall Risk:    08/19/2023    1:41 PM 04/23/2023    2:22 PM 10/23/2022   11:33 AM 07/24/2022    1:49 PM 05/28/2022    8:26 AM  Fall Risk   Falls in the past year? 0 0 0 0 0  Number falls in past yr: 0 0 0 0 0  Injury with Fall? 0 0 0 0 0  Risk for fall due to : No Fall Risks No Fall Risks No Fall Risks No Fall Risks No Fall Risks  Follow up Falls prevention discussed;Education provided;Falls evaluation completed Falls prevention discussed Falls prevention discussed Falls prevention discussed;Education provided;Falls evaluation completed Falls prevention discussed;Education provided;Falls evaluation completed     Assessment & Plan  1. Type 2 diabetes mellitus with obesity (HCC) (Primary)  - POCT glycosylated hemoglobin (Hb A1C) - glipiZIDE (GLUCOTROL XL) 5 MG 24 hr tablet; Take 1 tablet (5 mg total) by mouth every morning.  Dispense: 90 tablet; Refill: 1 - metFORMIN (GLUCOPHAGE-XR) 750 MG 24 hr tablet; TAKE 2 TABLETS (1,500 MG TOTAL) DAILY WITH BREAKFAST  Dispense: 180 tablet; Refill: 1 - amLODipine (NORVASC) 2.5 MG tablet; Take 1 tablet (2.5 mg total) by mouth daily.  Dispense: 90 tablet; Refill: 1  2. Uncontrolled hypertension  - amLODipine (NORVASC) 2.5 MG tablet; Take 1 tablet (2.5 mg total) by mouth daily.  Dispense: 90 tablet; Refill: 1  3. Dyslipidemia associated with type 2 diabetes mellitus (HCC)  - pravastatin (PRAVACHOL) 40 MG tablet; Take 1 tablet (40 mg total) by mouth daily.  Dispense: 90 tablet; Refill: 1  4. Psoriatic arthritis  (HCC)  Under the care of Rheumatologist   5. Polymyalgia rheumatica (HCC)  stable  6. Mild intermittent asthma without complication  - montelukast (SINGULAIR) 10 MG tablet; Take 1 tablet (10 mg total) by mouth at bedtime.  Dispense: 90 tablet; Refill: 1  7. Stage 3a chronic kidney disease (HCC)  Recheck next visit   8. Benign hypertension with chronic kidney disease, stage III (HCC)  On ARB we will add amlodipine today, monitor bp at home   9. OSA on CPAP  Compliant

## 2023-08-19 NOTE — Patient Instructions (Addendum)
 Take two metformins daily , 750 mg each  New bp medication Amlodipine 2.5 mg daily

## 2023-08-25 ENCOUNTER — Other Ambulatory Visit: Payer: Self-pay

## 2023-08-25 DIAGNOSIS — I10 Essential (primary) hypertension: Secondary | ICD-10-CM | POA: Diagnosis present

## 2023-08-25 NOTE — ED Triage Notes (Signed)
 Pt reports she has had HTN all week, pt has hx has not missed any doses but had recent BP medication change by PCP. Pt states she has chest soreness but that is normal for her

## 2023-08-26 ENCOUNTER — Emergency Department

## 2023-08-26 ENCOUNTER — Emergency Department
Admission: EM | Admit: 2023-08-26 | Discharge: 2023-08-26 | Disposition: A | Discharge status: Home / Self Care | Attending: Emergency Medicine | Admitting: Emergency Medicine

## 2023-08-26 DIAGNOSIS — I10 Essential (primary) hypertension: Secondary | ICD-10-CM | POA: Diagnosis not present

## 2023-08-26 LAB — BASIC METABOLIC PANEL
Anion gap: 13 (ref 5–15)
BUN: 13 mg/dL (ref 8–23)
CO2: 26 mmol/L (ref 22–32)
Calcium: 9.1 mg/dL (ref 8.9–10.3)
Chloride: 99 mmol/L (ref 98–111)
Creatinine, Ser: 0.84 mg/dL (ref 0.44–1.00)
GFR, Estimated: >60 mL/min (ref >60)
Glucose, Bld: 208 mg/dL — ABNORMAL HIGH (ref 70–99)
Potassium: 2.8 mmol/L — ABNORMAL LOW (ref 3.5–5.1)
Sodium: 138 mmol/L (ref 135–145)

## 2023-08-26 LAB — CBC
HCT: 39.1 % (ref 36.0–46.0)
Hemoglobin: 13.7 g/dL (ref 12.0–15.0)
MCH: 31.9 pg (ref 26.0–34.0)
MCHC: 35 g/dL (ref 30.0–36.0)
MCV: 91.1 fL (ref 80.0–100.0)
Platelets: 262 10*3/uL (ref 150–400)
RBC: 4.29 MIL/uL (ref 3.87–5.11)
RDW: 12.8 % (ref 11.5–15.5)
WBC: 10 10*3/uL (ref 4.0–10.5)
nRBC: 0 % (ref 0.0–0.2)

## 2023-08-26 LAB — TROPONIN I (HIGH SENSITIVITY): Troponin I (High Sensitivity): 11 ng/L (ref <18)

## 2023-08-26 MED ORDER — POTASSIUM CHLORIDE CRYS ER 20 MEQ PO TBCR
40.0000 meq | EXTENDED_RELEASE_TABLET | Freq: Once | ORAL | Status: AC
  Administered 2023-08-26: 40 meq via ORAL
  Filled 2023-08-26: qty 2

## 2023-08-26 NOTE — Discharge Instructions (Addendum)
As we discussed, though you do have high blood pressure (hypertension), fortunately it is not immediately dangerous at this time and does not need emergency intervention or admission to the hospital.  If we add to or change your regular medications, we may cause more harm than good - it is more appropriate for your primary care doctor to evaluate you in clinic and decide if any medication changes are needed.  Please follow up in clinic as recommended in these papers.   ° °Return to the Emergency Department (ED) if you experience any worsening chest pain/pressure/tightness, difficulty breathing, or sudden sweating, or other symptoms that concern you. °

## 2023-08-26 NOTE — ED Provider Notes (Signed)
 [facility] Provider Note    Event Date/Time   First MD Initiated Contact with Patient 08/26/23 [phone]     (approximate)   History   Hypertension   HPI Mary Cantrell is a 78 y.o. female whose medical history includes hypertension.  She presents with her husband for evaluation of persistent high blood pressure readings at home.  The uncontrolled hypertension is relatively new and she recently saw her primary care doctor.  Her provider started her on low-dose amlodipine but she has not yet received the medication from her mail order pharmacy.  She and her husband became concerned tonight when her systolic blood pressure is continually elevated and her diastolic is elevated as well although somewhat more variable.  They have noted a few blood pressure readings in the 190-200 range.  The patient denies any other symptoms.  She said that sometimes she has pain in her chest but that is normal and unchanged from baseline.  No shortness of breath, no changes in urinary or bowel habits.     Physical Exam   Triage Vital Signs: ED Triage Vitals  Encounter Vitals Group     BP 08/25/23 2355 (!) 149/134     Systolic BP Percentile --      Diastolic BP Percentile --      Pulse Rate 08/25/23 2355 (!) 105     Resp 08/25/23 2355 19     Temp 08/25/23 2355 98.4 F (36.9 C)     Temp Source 08/25/23 2355 Oral     SpO2 08/25/23 2355 96 %     Weight 08/25/23 2359 78.5 kg (173 lb)     Height 08/25/23 2359 1.676 m (5\' 6" )     Head Circumference --      Peak Flow --      Pain Score 08/25/23 2359 3     Pain Loc --      Pain Education --      Exclude from Growth Chart --     Most recent vital signs: Vitals:   08/25/23 2355 08/26/23 0435  BP: (!) 149/134 (!) 152/134  Pulse: (!) 105 75  Resp: 19 18  Temp: 98.4 F (36.9 C)   SpO2: 96% 96%    General: Awake, no distress.  CV:  Good peripheral perfusion.  Normal heart sounds, regular rate and rhythm Resp:  Normal effort.  Speaking easily and comfortably, no accessory muscle usage nor intercostal retractions.  Lungs are clear to auscultation. Abd:  No distention.  Psych:  Mood and affect are normal and appropriate.  Patient is laughing and joking with me, fully conversant, in no distress and in good spirits.  ED Results / Procedures / Treatments   Labs (all labs ordered are listed, but only abnormal results are displayed) Labs Reviewed  BASIC METABOLIC PANEL - Abnormal; Notable for the following components:      Result Value   Potassium 2.8 (*)    Glucose, Bld 208 (*)    All other components within normal limits  CBC  TROPONIN I (HIGH SENSITIVITY)     EKG  ED ECG REPORT I, Loleta Rose, the attending physician, personally viewed and interpreted this ECG.  Date: 08/26/2023 EKG Time: 00: 01 Rate: 93 Rhythm: normal sinus rhythm with sinus arrhythmia QRS Axis: normal Intervals: normal ST/T Wave abnormalities: normal Narrative Interpretation: no evidence of acute ischemia    RADIOLOGY I viewed and interpreted the patient's two-view chest x-ray and I see no evidence of pneumonia or  pneumothorax.  I also read the radiologist's report, which confirmed no acute findings.   PROCEDURES:  Critical Care performed: No  Procedures    IMPRESSION / MDM / ASSESSMENT AND PLAN / ED COURSE  I reviewed the triage vital signs and the nursing notes.                              Differential diagnosis includes, but is not limited to, essential hypertension, ACS, PE, medication or drug side effect, dietary indiscretion, renal dysfunction.  Patient's presentation is most consistent with acute presentation with potential threat to life or bodily function.  Labs/studies ordered: EKG, two-view chest x-ray, high-sensitivity troponin, CBC, BMP  Interventions/Medications given:  Medications  potassium chloride SA (KLOR-CON M) CR tablet 40 mEq (40 mEq Oral Given 08/26/23 0328)    (Note:  hospital course my  include additional interventions and/or labs/studies not listed above.)   Vital signs are normal other than hypertension.  However she is asymptomatic.  Workup is very reassuring.  Her potassium level is a bit low and I ordered 40 mill equivalents to replete, but otherwise there is no evidence of any emergent medical condition.  She and her husband were concerned about the high blood pressure but I had my usual asymptomatic hypertension discussion with him.  Even though she typically has uncontrolled hypertension, she has a plan and her medication should arrive soon.  I encouraged her to continue taking all of her medications including the new prescription for amlodipine as recommended and follow-up with her primary care doctor at the next available opportunity.  The patient's medical screening exam is reassuring with no indication of an emergent medical condition requiring hospitalization or additional evaluation at this point.  The patient is safe and appropriate for discharge and outpatient follow up.  The patient and her husband agree with the plan.      FINAL CLINICAL IMPRESSION(S) / ED DIAGNOSES   Final diagnoses:  Uncontrolled hypertension     Rx / DC Orders   ED Discharge Orders     None        Note:  This document was prepared using Dragon voice recognition software and may include unintentional dictation errors.   Loleta Rose, MD 08/26/23 0500

## 2023-08-27 ENCOUNTER — Telehealth: Payer: Self-pay

## 2023-08-27 ENCOUNTER — Other Ambulatory Visit: Payer: Self-pay | Admitting: Family Medicine

## 2023-08-27 DIAGNOSIS — E1169 Type 2 diabetes mellitus with other specified complication: Secondary | ICD-10-CM

## 2023-08-27 DIAGNOSIS — I10 Essential (primary) hypertension: Secondary | ICD-10-CM

## 2023-08-27 MED ORDER — AMLODIPINE BESYLATE 2.5 MG PO TABS: 2.5000 mg | ORAL_TABLET | Freq: Every day | ORAL | 0 refills | Status: DC

## 2023-08-27 NOTE — Telephone Encounter (Signed)
 Copied from CRM [id number]. Topic: Clinical - Prescription Issue >> Aug 27, 2023  2:48 PM Gery Pray wrote: Reason for CRM: Patient called in upset that medication of amLODipine (NORVASC) 2.5 MG tablet was delayed in the mail. Patient wanted to know if provider Dr. Carlynn Purl will call in a 10 day [facility] until patient is able to receive her prescription.   No answer left detailed vm

## 2023-08-27 NOTE — Telephone Encounter (Signed)
 Copied from CRM [id number]. Topic: Clinical - Medication Refill >> Aug 27, 2023 12:50 PM Patsy Lager T wrote: Most Recent Primary Care Visit:  Provider: Della Goo F  Department: [facility]  Visit Type: OFFICE VISIT  Date: 04/23/2023  Medication: amLODipine (NORVASC) 2.5 MG tablet  Has the patient contacted their pharmacy? No (Agent: If no, request that the patient contact the pharmacy for the refill. If patient does not wish to contact the pharmacy document the reason why and proceed with request.) (Agent: If yes, when and what did the pharmacy advise?)  Is this the correct pharmacy for this prescription? Yes  This is the patient's preferred pharmacy:   [id number] - [address] [place] [address] Phone: [phone] Fax: [phone]  Has the prescription been filled recently? Yes  Is the patient out of the medication? Yes  Has the patient been seen for an appointment in the last year OR does the patient have an upcoming appointment? Yes  Can we respond through MyChart? No  Agent: Please be advised that Rx refills may take up to 3 business days. We ask that you follow-up with your pharmacy.  Medication was sent to her her mail in pharmacy but it has been held up in the mail and she needs a 10 day supply until her meds are delivered.

## 2023-08-28 NOTE — Telephone Encounter (Signed)
 Call to patient- [facility]- still awaiting Rx  from mail order.  Not needed at this time- duplicate requet Requested Prescriptions  Pending Prescriptions Disp Refills   amLODipine (NORVASC) 2.5 MG tablet 90 tablet 1    Sig: Take 1 tablet (2.5 mg total) by mouth daily.     Cardiovascular: Calcium Channel Blockers 2 Failed - 08/28/2023 11:13 AM      Failed - Last BP in normal range    BP Readings from Last 1 Encounters:  08/26/23 (!) 152/134         Passed - Last Heart Rate in normal range    Pulse Readings from Last 1 Encounters:  08/26/23 75         Passed - Valid encounter within last 6 months    Recent Outpatient Visits           4 months ago Type 2 diabetes mellitus with obesity [facility])   [facility] Della Goo F, FNP   8 months ago Type 2 diabetes mellitus with obesity [facility])   [place] [facility] Mecum, Erin E, PA-C   10 months ago Dyslipidemia associated with type 2 diabetes mellitus [facility])   [facility] Alba Cory, MD   1 year ago Dyslipidemia associated with type 2 diabetes mellitus [facility])   [place] [facility] Alba Cory, MD   1 year ago Cough, unspecified type   [facility] Mecum, Oswaldo Conroy, [place]       Future Appointments             In 4 months Alba Cory, MD [facility]

## 2023-09-01 ENCOUNTER — Other Ambulatory Visit: Payer: Self-pay | Admitting: Physician Assistant

## 2023-09-01 DIAGNOSIS — E1169 Type 2 diabetes mellitus with other specified complication: Secondary | ICD-10-CM

## 2023-09-02 ENCOUNTER — Ambulatory Visit: Payer: Medicare Other

## 2023-09-02 DIAGNOSIS — I10 Essential (primary) hypertension: Secondary | ICD-10-CM

## 2023-09-02 NOTE — Telephone Encounter (Signed)
 Requested Prescriptions  Refused Prescriptions Disp Refills   metFORMIN (GLUCOPHAGE-XR) 750 MG 24 hr tablet [Pharmacy Med Name: METFORMIN HCL ER TABS 750MG ] 180 tablet 3    Sig: TAKE 2 TABLETS DAILY WITH BREAKFAST     Endocrinology:  Diabetes - Biguanides Passed - 09/02/2023 10:32 AM      Passed - Cr in normal range and within 360 days    Creat  Date Value Ref Range Status  04/23/2023 0.87 0.60 - 1.00 mg/dL Final   Creatinine, Ser  Date Value Ref Range Status  08/26/2023 0.84 0.44 - 1.00 mg/dL Final   Creatinine, Urine  Date Value Ref Range Status  04/23/2023 78 20 - 275 mg/dL Final         Passed - HBA1C is between 0 and 7.9 and within 180 days    Hemoglobin A1C  Date Value Ref Range Status  08/19/2023 7.3 (A) 4.0 - 5.6 % Final  04/19/2019 7.3  Final   Hgb A1c MFr Bld  Date Value Ref Range Status  04/23/2023 5.9 (H) <5.7 % of total Hgb Final    Comment:    For someone without known diabetes, a hemoglobin  A1c value between 5.7% and 6.4% is consistent with prediabetes and should be confirmed with a  follow-up test. . For someone with known diabetes, a value <7% indicates that their diabetes is well controlled. A1c targets should be individualized based on duration of diabetes, age, comorbid conditions, and other considerations. . This assay result is consistent with an increased risk of diabetes. . Currently, no consensus exists regarding use of hemoglobin A1c for diagnosis of diabetes for children. .          Passed - eGFR in normal range and within 360 days    GFR, Est African American  Date Value Ref Range Status  05/31/2020 59 (L) > OR = 60 mL/min/1.37m2 Final   GFR, Est Non African American  Date Value Ref Range Status  05/31/2020 51 (L) > OR = 60 mL/min/1.75m2 Final   GFR, Estimated  Date Value Ref Range Status  08/26/2023 >60 >60 mL/min Final    Comment:    (NOTE) Calculated using the CKD-EPI Creatinine Equation (2021)    eGFR  Date Value Ref  Range Status  04/23/2023 68 > OR = 60 mL/min/1.62m2 Final         Passed - B12 Level in normal range and within 720 days    Vitamin B-12  Date Value Ref Range Status  01/28/2022 945 200 - 1,100 pg/mL Final         Passed - Valid encounter within last 6 months    Recent Outpatient Visits           4 months ago Type 2 diabetes mellitus with obesity [facility])   [place] [facility] Della Goo F, FNP   8 months ago Type 2 diabetes mellitus with obesity [facility])   [place] [facility] Mecum, Erin E, PA-C   10 months ago Dyslipidemia associated with type 2 diabetes mellitus [facility])   [place] [facility] Alba Cory, MD   1 year ago Dyslipidemia associated with type 2 diabetes mellitus [facility])   [place] [facility] Alba Cory, MD   1 year ago Cough, unspecified type   [facility] Mecum, Oswaldo Conroy, PA-C       Future Appointments             In 3 months  Alba Cory, MD [facility], [facility]            Passed - CBC within normal limits and completed in the last 12 months    WBC  Date Value Ref Range Status  08/26/2023 10.0 4.0 - 10.5 K/uL Final   RBC  Date Value Ref Range Status  08/26/2023 4.29 3.87 - 5.11 MIL/uL Final   Hemoglobin  Date Value Ref Range Status  08/26/2023 13.7 12.0 - 15.0 g/dL Final  16/03/9603 54.0 11.1 - 15.9 g/dL Final   HCT  Date Value Ref Range Status  08/26/2023 39.1 36.0 - 46.0 % Final   Hematocrit  Date Value Ref Range Status  02/10/2015 40.0 34.0 - 46.6 % Final   MCHC  Date Value Ref Range Status  08/26/2023 35.0 30.0 - 36.0 g/dL Final   [facility]  Date Value Ref Range Status  08/26/2023 31.9 26.0 - 34.0 pg Final   MCV  Date Value Ref Range Status  08/26/2023 91.1 80.0 - 100.0 fL Final  02/10/2015 87 79 - 97 fL Final  12/31/2011 91 80 - 100 fL Final   No results found for: "PLTCOUNTKUC", "LABPLAT", "POCPLA" RDW   Date Value Ref Range Status  08/26/2023 12.8 11.5 - 15.5 % Final  02/10/2015 13.4 12.3 - 15.4 % Final  12/31/2011 13.3 11.5 - 14.5 % Final

## 2023-09-02 NOTE — Progress Notes (Signed)
 Patient is in office today for a nurse visit for Blood Pressure Check. Patient blood pressure was 158/84, Patient No chest pain, No shortness of breath, No dyspnea on exertion, No orthopnea, No paroxysmal nocturnal dyspnea, No edema, No palpitations, No syncope. Patient did not pick up medicine till Friday pm and took her first dose, since she noticed bp high she reached out to her daughter since she is a Engineer, civil (consulting) and was advised to take 2 of the amlodipine (Sat dose)    Per OV NOTE: Hypertension : BP is out of control today, she states she has been taking Benicar , she thinks secondary to stress due to missing her dog. We will add norvasc 2.5 mg daily and return for bp check with CMA

## 2023-09-08 LAB — HM DIABETES EYE EXAM

## 2023-09-18 ENCOUNTER — Other Ambulatory Visit: Payer: Self-pay | Admitting: Family Medicine

## 2023-09-18 DIAGNOSIS — E669 Obesity, unspecified: Secondary | ICD-10-CM

## 2023-10-27 ENCOUNTER — Other Ambulatory Visit: Payer: Self-pay | Admitting: Family Medicine

## 2023-12-17 ENCOUNTER — Other Ambulatory Visit: Payer: Self-pay | Admitting: Family Medicine

## 2023-12-17 ENCOUNTER — Other Ambulatory Visit: Payer: Self-pay

## 2023-12-17 DIAGNOSIS — I10 Essential (primary) hypertension: Secondary | ICD-10-CM

## 2023-12-17 MED ORDER — OLMESARTAN MEDOXOMIL 40 MG PO TABS: 40.0000 mg | ORAL_TABLET | Freq: Every day | ORAL | 0 refills | Status: DC

## 2023-12-19 ENCOUNTER — Telehealth: Payer: Self-pay

## 2023-12-19 DIAGNOSIS — I10 Essential (primary) hypertension: Secondary | ICD-10-CM

## 2023-12-19 DIAGNOSIS — E1169 Type 2 diabetes mellitus with other specified complication: Secondary | ICD-10-CM

## 2023-12-19 MED ORDER — AMLODIPINE BESYLATE 2.5 MG PO TABS
2.5000 mg | ORAL_TABLET | Freq: Every day | ORAL | 0 refills | Status: DC
Start: 2023-12-19 — End: 2023-12-29

## 2023-12-19 MED ORDER — OLMESARTAN MEDOXOMIL 40 MG PO TABS: 40.0000 mg | ORAL_TABLET | Freq: Every day | ORAL | 0 refills | Status: DC

## 2023-12-19 NOTE — Telephone Encounter (Signed)
 Copied from CRM [id number]. Topic: Clinical - Medication Question >> Dec 18, 2023  4:47 PM Winona R wrote: Pt is very upset that her medication olmesartan  (BENICAR ) 40 MG tablet [[id number]] was stopped and is not happy with Dr. Glenard. I informed her that her medication was sent but she states [facility] told her on the 23rd that the dr informed them not to fill it.   Pt has Inquired about transferring care to another provider in office.

## 2023-12-19 NOTE — Telephone Encounter (Signed)
 Spoke with pharmacy and patient 90 day will be resent to [facility] and pt has f/u on 7/7 for an appt

## 2023-12-19 NOTE — Telephone Encounter (Signed)
 Tried to call pt x2 left detailed vm will try to call back.

## 2023-12-19 NOTE — Addendum Note (Signed)
 Addended by: YVONE WARREN BROCKS on: 12/19/2023 01:32 PM   Modules accepted: Orders

## 2023-12-29 ENCOUNTER — Encounter: Payer: Self-pay | Admitting: Family Medicine

## 2023-12-29 ENCOUNTER — Ambulatory Visit: Payer: Medicare Other | Admitting: Family Medicine

## 2023-12-29 VITALS — BP 144/72 | HR 97 | Resp 16 | Ht 66.0 in | Wt 176.4 lb

## 2023-12-29 DIAGNOSIS — E1169 Type 2 diabetes mellitus with other specified complication: Secondary | ICD-10-CM

## 2023-12-29 DIAGNOSIS — N1831 Chronic kidney disease, stage 3a: Secondary | ICD-10-CM | POA: Diagnosis not present

## 2023-12-29 DIAGNOSIS — L4050 Arthropathic psoriasis, unspecified: Secondary | ICD-10-CM

## 2023-12-29 DIAGNOSIS — J4520 Mild intermittent asthma, uncomplicated: Secondary | ICD-10-CM

## 2023-12-29 DIAGNOSIS — E785 Hyperlipidemia, unspecified: Secondary | ICD-10-CM

## 2023-12-29 DIAGNOSIS — G4733 Obstructive sleep apnea (adult) (pediatric): Secondary | ICD-10-CM

## 2023-12-29 DIAGNOSIS — Z7984 Long term (current) use of oral hypoglycemic drugs: Secondary | ICD-10-CM

## 2023-12-29 DIAGNOSIS — I10 Essential (primary) hypertension: Secondary | ICD-10-CM

## 2023-12-29 DIAGNOSIS — M353 Polymyalgia rheumatica: Secondary | ICD-10-CM

## 2023-12-29 DIAGNOSIS — F331 Major depressive disorder, recurrent, moderate: Secondary | ICD-10-CM

## 2023-12-29 LAB — POCT GLYCOSYLATED HEMOGLOBIN (HGB A1C): Hemoglobin A1C: 6.7 % — AB (ref 4.0–5.6)

## 2023-12-29 MED ORDER — AMLODIPINE BESYLATE 2.5 MG PO TABS: 2.5000 mg | ORAL_TABLET | Freq: Every day | ORAL | 1 refills | Status: DC

## 2023-12-29 MED ORDER — MONTELUKAST SODIUM 10 MG PO TABS: 10.0000 mg | ORAL_TABLET | Freq: Every day | ORAL | 1 refills | Status: DC

## 2023-12-29 MED ORDER — METFORMIN HCL ER 750 MG PO TB24: ORAL_TABLET | ORAL | 1 refills | Status: DC

## 2023-12-29 MED ORDER — PRAVASTATIN SODIUM 40 MG PO TABS: 40.0000 mg | ORAL_TABLET | Freq: Every day | ORAL | 1 refills | Status: DC

## 2023-12-29 MED ORDER — OLMESARTAN MEDOXOMIL 40 MG PO TABS: 40.0000 mg | ORAL_TABLET | Freq: Every day | ORAL | 1 refills | Status: DC

## 2023-12-29 MED ORDER — GLIPIZIDE ER 5 MG PO TB24
5.0000 mg | ORAL_TABLET | Freq: Every morning | ORAL | 1 refills | Status: DC
Start: 2023-12-29 — End: 2024-05-05

## 2023-12-29 NOTE — Progress Notes (Signed)
 Name: Mary Cantrell   MRN: [id number]    DOB: [birth year]   Date:12/29/2023       Progress Note  Subjective  Chief Complaint  Chief Complaint  Patient presents with   Medical Management of Chronic Issues   HPI   DMII: A1C has improved, down from 7.3 % to 6.7 % , she denies polyphagia, polydipsia or polyuria. She is taking Metformin  and Glipizide  and denies hypoglycemic episodes. She has associated HTN and dyslipidemia. Out of Olmasartan for about one week and bp is elevated. Last LDL was 73  on statin therapy    OSA: on CPAP : she is compliant , denies morning headaches, fatigue is stable   Hyperlipidemia: she is only able to tolerate pravastatin , last LDL was closer to goal , down to 73    Gout: diagnosed by Dr. Leni and is doing okay at this time.   Mild Intermittent Asthma: she used to see  Dr. Theotis , she is on Symbicort prn, states symptoms controlled with singular at this time    Hypertension : BP is out of control today, she has been out of Benicar , we will resume medication today and continue norvasc  2.5 mg dose. She has chest wall pain ( chronic ) but denies decrease in exercise tolerance.    Psoriasis: on her elbows, uses topical medication prn, no lesions at this time. She also has psoriatic arthritis but denies current pain on her hands like she used to have    PMR: still under the care of Rheumatologist, Dr. Leni but is going to see another provider this Fall since Dr. Leni is retiring .   She also has OA also psoriatic arthritis and is on methotrexate  and low dose prednisone  and states symptoms are well controlled, but avoids using stairs    Tremors  she is under the care of Dr. Lane, she has tremors, balanced problems, sensory ataxia  she stopped taking Gabapentin  because it caused fatigue. Symptoms have been stable    MDD recurrent:. PHq 9 has improved from last visit but still high. She states she does not like living in [place] , she is happier when she goes to [place] and [place] to  visit her family. Her husband does not like flying or driving that far and they sold their condo in [place].   Patient Active Problem List   Diagnosis Date Noted   Allergic sinusitis 04/23/2023   Moderate episode of recurrent major depressive disorder (HCC) 04/23/2023   Type 2 diabetes mellitus with obesity (HCC) 12/16/2022   Mild intermittent asthma without complication 07/24/2022   Hypertension, benign 08/22/2021   Long term (current) use of systemic steroids 08/22/2021   Lumbar spondylosis 08/22/2021   Osteoarthritis 08/22/2021   DDD (degenerative disc disease), lumbar 10/24/2020   Neuropathy 02/27/2020   Sensory ataxia 02/27/2020   Tremor 02/27/2020   Chronic kidney disease, stage III (moderate) (HCC) 12/09/2018   Dyslipidemia associated with type 2 diabetes mellitus (HCC) 12/09/2018   Obesity (BMI 30.0-34.9) 07/17/2018   Hypertension associated with stage 2 chronic kidney disease due to type 2 diabetes mellitus (HCC) 03/15/2018   Psoriatic arthritis (HCC) 12/04/2017   Stenosis of left carotid artery 12/16/2016   Polymyalgia rheumatica (HCC) 04/13/2015   Back pain, chronic 12/21/2014   Narrowing of intervertebral disc space 12/21/2014   Diabetic gastroparesis (HCC) 12/21/2014   Amaurosis fugax of left eye 12/21/2014   H/O: hypothyroidism 12/21/2014   Hyperlipidemia 12/21/2014   IBS (irritable bowel syndrome) 12/21/2014   Disorder of  macula of retina 12/21/2014   Asthma, moderate 12/21/2014   NASH (nonalcoholic steatohepatitis) 12/21/2014   NS (nuclear sclerosis) 12/21/2014   Osteoarthrosis 12/21/2014   Psoriasis 12/21/2014   Knee torn cartilage 12/21/2014   OSA on CPAP 01/03/2014   History of shoulder surgery 04/23/2011    Past Surgical History:  Procedure Laterality Date   ABDOMINAL HYSTERECTOMY     APPENDECTOMY     ARTERY BIOPSY N/A 02/23/2015   Procedure: BIOPSY TEMPORAL ARTERY;  Surgeon: Selinda GORMAN Gu, MD;  Location: [facility];  Service: Vascular;  Laterality: N/A;    BREAST BIOPSY Left    1990's   CATARACT EXTRACTION W/PHACO Left 07/24/2021   Procedure: CATARACT EXTRACTION PHACO AND INTRAOCULAR LENS PLACEMENT (IOC) LEFT DIABETIC 8.37 00:50.0;  Surgeon: Jaye Fallow, MD;  Location: [facility];  Service: Ophthalmology;  Laterality: Left;  Diabetic   CATARACT EXTRACTION W/PHACO Right 08/07/2021   Procedure: CATARACT EXTRACTION PHACO AND INTRAOCULAR LENS PLACEMENT (IOC) RIGHT DIABETIC 7.29 00:48.4;  Surgeon: Jaye Fallow, MD;  Location: [facility];  Service: Ophthalmology;  Laterality: Right;  Diabetic   EXCISION / CURETTAGE BONE CYST FINGER     Left Index Finger   PILONIDAL CYST EXCISION     REVERSE SHOULDER ARTHROPLASTY Left 08/27/2022   Procedure: REVERSE SHOULDER ARTHROPLASTY WITH BICEPS TENODESIS.- RNFA;  Surgeon: Edie Norleen PARAS, MD;  Location: [facility];  Service: Orthopedics;  Laterality: Left;   SHOULDER ARTHROSCOPY Right [id number]   Dr. Kathi   STOMACH SURGERY  1941   3 arteries leaking in stomach after appendectomy   TONSILLECTOMY      Family History  Problem Relation Age of Onset   Pneumonia Mother    Rheum arthritis Father    Diabetes Father    Pneumonia Father    Diabetes Sister    Osteoarthritis Sister    Breast cancer Cousin     Social History   Tobacco Use   Smoking status: Never   Smokeless tobacco: Never  Substance Use Topics   Alcohol use: No    Alcohol/week: 0.0 standard drinks of alcohol     Current Outpatient Medications:    aspirin 81 MG EC tablet, Take 1 tablet by mouth daily., Disp: , Rfl:    azelastine (ASTELIN) 0.1 % nasal spray, Place 1-2 sprays into both nostrils 2 (two) times daily. Use in each nostril as directed, Disp: , Rfl:    cholecalciferol (VITAMIN D3) 25 MCG (1000 UNIT) tablet, Take 1,000 Units by mouth daily., Disp: , Rfl:    Cinnamon 500 MG capsule, Take 2,000 mg by mouth daily., Disp: , Rfl:    cyanocobalamin 100 MCG tablet, Take 1,000 mcg by mouth 2 (two) times a week., Disp:  , Rfl:    dicyclomine  (BENTYL ) 20 MG tablet, Take 1 tablet (20 mg total) by mouth 3 (three) times daily as needed for spasms., Disp: 360 tablet, Rfl: 0   folic acid (FOLVITE) 1 MG tablet, Take 1 mg by mouth daily. , Disp: , Rfl:    methotrexate  (RHEUMATREX) 2.5 MG tablet, Take 20 mg by mouth once a week. Caution:Chemotherapy. Protect from light., Disp: , Rfl:    Multiple Vitamins-Minerals (MULTIVITAMIN ADULTS PO), Take 1 tablet by mouth daily. , Disp: , Rfl:    predniSONE  (DELTASONE ) 5 MG tablet, Take 5 mg by mouth daily., Disp: , Rfl:    vitamin C (ASCORBIC ACID) 500 MG tablet, Take 1 tablet by mouth at bedtime. , Disp: , Rfl:    amLODipine  (NORVASC ) 2.5 MG tablet,  Take 1 tablet (2.5 mg total) by mouth daily., Disp: 90 tablet, Rfl: 1   budesonide-formoterol (SYMBICORT) 160-4.5 MCG/ACT inhaler, Inhale 2 puffs into the lungs every morning. (Patient not taking: Reported on 12/29/2023), Disp: , Rfl:    fluticasone  (FLONASE ) 50 MCG/ACT nasal spray, USE 2 SPRAYS IN EACH NOSTRIL AT BEDTIME (Patient not taking: Reported on 12/29/2023), Disp: 48 g, Rfl: 3   glipiZIDE  (GLUCOTROL  XL) 5 MG 24 hr tablet, Take 1 tablet (5 mg total) by mouth every morning., Disp: 90 tablet, Rfl: 1   glucose blood (ONETOUCH ULTRA) test strip, 1 each by Other route daily. Use as instructed (Patient not taking: Reported on 12/29/2023), Disp: 100 each, Rfl: 2   loratadine  (CLARITIN ) 10 MG tablet, Take 10 mg by mouth daily. (Patient not taking: Reported on 12/29/2023), Disp: , Rfl:    metFORMIN  (GLUCOPHAGE -XR) 750 MG 24 hr tablet, TAKE 2 TABLETS (1,500 MG TOTAL) DAILY WITH BREAKFAST, Disp: 180 tablet, Rfl: 1   montelukast  (SINGULAIR ) 10 MG tablet, Take 1 tablet (10 mg total) by mouth at bedtime., Disp: 90 tablet, Rfl: 1   olmesartan  (BENICAR ) 40 MG tablet, Take 1 tablet (40 mg total) by mouth daily. In place of losartan, Disp: 90 tablet, Rfl: 1   pravastatin  (PRAVACHOL ) 40 MG tablet, Take 1 tablet (40 mg total) by mouth daily., Disp: 90  tablet, Rfl: 1  Allergies  Allergen Reactions   Doxycycline  Other (See Comments)    Weakness, flu like symptoms   Codeine     Dizzy   Lantus  [Insulin Glargine]     made skin hot   Oxycodone      Dizziness   Rosuvastatin      Myalgia    Penicillin G Rash    And dizziness   Sitagliptin Rash   Sulfa Antibiotics Rash    Other reaction(s):   Red burning skin   Wound Dressing Adhesive Rash    Surgical tape adhesive    I personally reviewed active problem list, medication list, allergies with the patient/caregiver today.   ROS  Ten systems reviewed and is negative except as mentioned in HPI    Objective Physical Exam Constitutional: Patient appears well-developed and well-nourished. Obese  No distress.  HEENT: head atraumatic, normocephalic, pupils equal and reactive to light, neck supple Cardiovascular: Normal rate, regular rhythm and normal heart sounds.  No murmur heard. No BLE edema. Pulmonary/Chest: Effort normal and breath sounds normal. No respiratory distress. Psychiatric: Patient has a normal mood and affect. behavior is normal. Judgment and thought content normal.   Vitals:   12/29/23 1506 12/29/23 1543  BP: (!) 152/88 (!) 144/72  Pulse: 97   Resp: 16   SpO2: 94%   Weight: 176 lb 6.4 oz (80 kg)   Height: 5' 6 (1.676 m)     Body mass index is 28.47 kg/m.  Recent Results (from the past 2160 hours)  POCT glycosylated hemoglobin (Hb A1C)     Status: Abnormal   Collection Time: 12/29/23  3:13 PM  Result Value Ref Range   Hemoglobin A1C 6.7 (A) 4.0 - 5.6 %   HbA1c POC (<> result, manual entry)     HbA1c, POC (prediabetic range)     HbA1c, POC (controlled diabetic range)      Diabetic Foot Exam:  Diabetic foot exam was performed with the following findings:   No deformities, ulcerations, or other skin breakdown Normal sensation of 10g monofilament Intact posterior tibialis and dorsalis pedis pulses      PHQ2/9:  12/29/2023    3:04 PM 08/19/2023     1:47 PM 04/23/2023    2:30 PM 04/23/2023    2:23 PM 10/23/2022   11:33 AM  Depression screen PHQ 2/9  Decreased Interest 2 3 0 0 2  Down, Depressed, Hopeless 2 3 0 0 1  PHQ - 2 Score 4 6 0 0 3  Altered sleeping 0 3 0  0  Tired, decreased energy 0 3 0  0  Change in appetite 0 3 0  3  Feeling bad or failure about yourself  0 0 0  0  Trouble concentrating 3 3 0  0  Moving slowly or fidgety/restless 0 0 0  0  Suicidal thoughts 0 0 0  0  PHQ-9 Score 7 18 0  6  Difficult doing work/chores Very difficult Very difficult Not difficult at all      phq 9 is positive  Fall Risk:    12/29/2023    2:53 PM 08/19/2023    1:41 PM 04/23/2023    2:22 PM 10/23/2022   11:33 AM 07/24/2022    1:49 PM  Fall Risk   Falls in the past year? 0 0 0 0 0  Number falls in past yr: 0 0 0 0 0  Injury with Fall? 0 0 0 0 0  Risk for fall due to : No Fall Risks No Fall Risks No Fall Risks No Fall Risks No Fall Risks  Follow up Falls evaluation completed Falls prevention discussed;Education provided;Falls evaluation completed Falls prevention discussed Falls prevention discussed Falls prevention discussed;Education provided;Falls evaluation completed     Assessment & Plan  1. Dyslipidemia associated with type 2 diabetes mellitus (HCC) (Primary)  - POCT glycosylated hemoglobin (Hb A1C) - HM Diabetes Foot Exam - amLODipine  (NORVASC ) 2.5 MG tablet; Take 1 tablet (2.5 mg total) by mouth daily.  Dispense: 90 tablet; Refill: 1 - glipiZIDE  (GLUCOTROL  XL) 5 MG 24 hr tablet; Take 1 tablet (5 mg total) by mouth every morning.  Dispense: 90 tablet; Refill: 1 - metFORMIN  (GLUCOPHAGE -XR) 750 MG 24 hr tablet; TAKE 2 TABLETS (1,500 MG TOTAL) DAILY WITH BREAKFAST  Dispense: 180 tablet; Refill: 1 - montelukast  (SINGULAIR ) 10 MG tablet; Take 1 tablet (10 mg total) by mouth at bedtime.  Dispense: 90 tablet; Refill: 1 - pravastatin  (PRAVACHOL ) 40 MG tablet; Take 1 tablet (40 mg total) by mouth daily.  Dispense: 90 tablet; Refill:  1  2. Psoriatic arthritis (HCC)  Doing well on current regiment   3. Stage 3a chronic kidney disease (HCC)  Recheck yearly   4. Moderate episode of recurrent major depressive disorder (HCC)  Refuses medication  5. Hypertension, benign  - amLODipine  (NORVASC ) 2.5 MG tablet; Take 1 tablet (2.5 mg total) by mouth daily.  Dispense: 90 tablet; Refill: 1 - olmesartan  (BENICAR ) 40 MG tablet; Take 1 tablet (40 mg total) by mouth daily. In place of losartan  Dispense: 90 tablet; Refill: 1  6. OSA on CPAP  Compliant   7. Mild intermittent asthma without complication  Doing better

## 2024-02-13 LAB — CBC AND DIFFERENTIAL
HCT: 42 (ref 36–46)
Hemoglobin: 14.5 (ref 12.0–16.0)
Neutrophils Absolute: 9.4
Platelets: 295 K/uL (ref 150–400)
WBC: 12.8

## 2024-02-13 LAB — HEPATIC FUNCTION PANEL
ALT: 17 U/L (ref 7–35)
AST: 23 (ref 13–35)
Alkaline Phosphatase: 85 (ref 25–125)
Bilirubin, Total: 0.6

## 2024-02-13 LAB — POCT ERYTHROCYTE SEDIMENTATION RATE, NON-AUTOMATED: Sed Rate: 6

## 2024-02-13 LAB — BASIC METABOLIC PANEL WITH GFR
BUN: 14 (ref 4–21)
CO2: 26 — AB (ref 13–22)
Chloride: 101 (ref 99–108)
Creatinine: 0.9 (ref 0.5–1.1)
Glucose: 157
Potassium: 3.2 meq/L — AB (ref 3.5–5.1)
Sodium: 145 (ref 137–147)

## 2024-02-13 LAB — COMPREHENSIVE METABOLIC PANEL WITH GFR
Albumin: 4.5 (ref 3.5–5.0)
Calcium: 10 (ref 8.7–10.7)
Globulin: 2.1
eGFR: 63

## 2024-02-13 LAB — CBC: RBC: 4.51 (ref 3.87–5.11)

## 2024-05-05 ENCOUNTER — Encounter: Payer: Self-pay | Admitting: Family Medicine

## 2024-05-05 ENCOUNTER — Ambulatory Visit (INDEPENDENT_AMBULATORY_CARE_PROVIDER_SITE_OTHER): Admitting: Family Medicine

## 2024-05-05 VITALS — BP 142/84 | HR 79 | Resp 16 | Ht 66.0 in | Wt 179.4 lb

## 2024-05-05 DIAGNOSIS — L4050 Arthropathic psoriasis, unspecified: Secondary | ICD-10-CM

## 2024-05-05 DIAGNOSIS — E1169 Type 2 diabetes mellitus with other specified complication: Secondary | ICD-10-CM | POA: Diagnosis not present

## 2024-05-05 DIAGNOSIS — E785 Hyperlipidemia, unspecified: Secondary | ICD-10-CM

## 2024-05-05 DIAGNOSIS — J4520 Mild intermittent asthma, uncomplicated: Secondary | ICD-10-CM

## 2024-05-05 DIAGNOSIS — M353 Polymyalgia rheumatica: Secondary | ICD-10-CM

## 2024-05-05 DIAGNOSIS — J069 Acute upper respiratory infection, unspecified: Secondary | ICD-10-CM

## 2024-05-05 DIAGNOSIS — F331 Major depressive disorder, recurrent, moderate: Secondary | ICD-10-CM

## 2024-05-05 DIAGNOSIS — I129 Hypertensive chronic kidney disease with stage 1 through stage 4 chronic kidney disease, or unspecified chronic kidney disease: Secondary | ICD-10-CM | POA: Diagnosis not present

## 2024-05-05 DIAGNOSIS — N183 Chronic kidney disease, stage 3 unspecified: Secondary | ICD-10-CM

## 2024-05-05 DIAGNOSIS — Z7984 Long term (current) use of oral hypoglycemic drugs: Secondary | ICD-10-CM

## 2024-05-05 DIAGNOSIS — E1122 Type 2 diabetes mellitus with diabetic chronic kidney disease: Secondary | ICD-10-CM

## 2024-05-05 LAB — POCT GLYCOSYLATED HEMOGLOBIN (HGB A1C): Hemoglobin A1C: 7.2 % — AB (ref 4.0–5.6)

## 2024-05-05 MED ORDER — MONTELUKAST SODIUM 10 MG PO TABS: 10.0000 mg | ORAL_TABLET | Freq: Every day | ORAL | 1 refills | Status: AC

## 2024-05-05 MED ORDER — GLIPIZIDE ER 5 MG PO TB24: 5.0000 mg | ORAL_TABLET | Freq: Every morning | ORAL | 1 refills | Status: AC

## 2024-05-05 MED ORDER — METFORMIN HCL ER 750 MG PO TB24: ORAL_TABLET | ORAL | 1 refills | Status: DC

## 2024-05-05 MED ORDER — PRAVASTATIN SODIUM 40 MG PO TABS: 40.0000 mg | ORAL_TABLET | Freq: Every day | ORAL | 1 refills | Status: AC

## 2024-05-05 MED ORDER — OLMESARTAN MEDOXOMIL 40 MG PO TABS: 40.0000 mg | ORAL_TABLET | Freq: Every day | ORAL | 1 refills | Status: AC

## 2024-05-05 MED ORDER — AMLODIPINE BESYLATE 2.5 MG PO TABS: 2.5000 mg | ORAL_TABLET | Freq: Two times a day (BID) | ORAL | 1 refills | Status: AC

## 2024-05-05 NOTE — Progress Notes (Signed)
 Name: Mary Cantrell   MRN: [id number]    DOB: [birth year]   Date:05/05/2024       Progress Note  Subjective  Chief Complaint  Chief Complaint  Patient presents with   Medical Management of Chronic Issues   Discussed the use of AI scribe software for clinical note transcription with the patient, who gave verbal consent to proceed.  History of Present Illness Mary Cantrell is a 78 year old female with hypertension and chronic kidney disease stage three who presents for a four-month follow-up and management of a cold.  She has been experiencing cold symptoms for the past week, including significant fatigue and a persistent cough that causes rib pain. Over-the-counter decongestants have not been effective, and she notes difficulty breathing. There has been no worsening of symptoms since onset, but recovery is slow.  Her blood pressure reading today was 174/96 mmHg. She is on olmesartan  40 mg and amlodipine . No chest pain, palpitations, or unusual sensations reported.  Chronic kidney disease stage three is being monitored, with recent blood work showing improved kidney function but low potassium levels. She takes a multivitamin and consumes bananas weekly to address this.  She has type 2 diabetes, with an A1c increase from 6.7 to 7.2. She is on glipizide  5 mg and metformin , which she usually takes twice daily. She does not monitor her blood sugar regularly and denies symptoms of hypoglycemia or hyperglycemia.  She has a history of polymyalgia rheumatica and psoriatic arthritis, for which she is on prednisone . She was off prednisone  for three weeks but resumed due to pain, primarily affecting her right hand.  Her asthma is managed with Symbicort and montelukast , but she has not had issues for over a year and does not use these medications regularly. She did not consider using them for her current cold symptoms.  She has a long history of moderate depression, not currently on medication. Emotional  well-being is affected by her living situation, as her family resides in the [place]  area.    Patient Active Problem List   Diagnosis Date Noted   Allergic sinusitis 04/23/2023   Moderate episode of recurrent major depressive disorder (HCC) 04/23/2023   Type 2 diabetes mellitus with obesity 12/16/2022   Mild intermittent asthma without complication 07/24/2022   Hypertension, benign 08/22/2021   Long term (current) use of systemic steroids 08/22/2021   Lumbar spondylosis 08/22/2021   Osteoarthritis 08/22/2021   DDD (degenerative disc disease), lumbar 10/24/2020   Neuropathy 02/27/2020   Sensory ataxia 02/27/2020   Tremor 02/27/2020   Chronic kidney disease, stage III (moderate) (HCC) 12/09/2018   Dyslipidemia associated with type 2 diabetes mellitus (HCC) 12/09/2018   Obesity (BMI 30.0-34.9) 07/17/2018   Hypertension associated with stage 2 chronic kidney disease due to type 2 diabetes mellitus (HCC) 03/15/2018   Psoriatic arthritis (HCC) 12/04/2017   Stenosis of left carotid artery 12/16/2016   Polymyalgia rheumatica 04/13/2015   Back pain, chronic 12/21/2014   Narrowing of intervertebral disc space 12/21/2014   Diabetic gastroparesis (HCC) 12/21/2014   Amaurosis fugax of left eye 12/21/2014   H/O: hypothyroidism 12/21/2014   Hyperlipidemia 12/21/2014   IBS (irritable bowel syndrome) 12/21/2014   Disorder of macula of retina 12/21/2014   Asthma, moderate 12/21/2014   NASH (nonalcoholic steatohepatitis) 12/21/2014   NS (nuclear sclerosis) 12/21/2014   Osteoarthrosis 12/21/2014   Psoriasis 12/21/2014   Knee torn cartilage 12/21/2014   OSA on CPAP 01/03/2014   History of shoulder surgery 04/23/2011    Past Surgical History:  Procedure Laterality Date   ABDOMINAL HYSTERECTOMY     APPENDECTOMY     ARTERY BIOPSY N/A 02/23/2015   Procedure: BIOPSY TEMPORAL ARTERY;  Surgeon: Selinda GORMAN Gu, MD;  Location: [facility];  Service: Vascular;  Laterality: N/A;   BREAST BIOPSY Left     1990's   CATARACT EXTRACTION W/PHACO Left 07/24/2021   Procedure: CATARACT EXTRACTION PHACO AND INTRAOCULAR LENS PLACEMENT (IOC) LEFT DIABETIC 8.37 00:50.0;  Surgeon: Jaye Fallow, MD;  Location: [facility];  Service: Ophthalmology;  Laterality: Left;  Diabetic   CATARACT EXTRACTION W/PHACO Right 08/07/2021   Procedure: CATARACT EXTRACTION PHACO AND INTRAOCULAR LENS PLACEMENT (IOC) RIGHT DIABETIC 7.29 00:48.4;  Surgeon: Jaye Fallow, MD;  Location: [facility];  Service: Ophthalmology;  Laterality: Right;  Diabetic   EXCISION / CURETTAGE BONE CYST FINGER     Left Index Finger   PILONIDAL CYST EXCISION     REVERSE SHOULDER ARTHROPLASTY Left 08/27/2022   Procedure: REVERSE SHOULDER ARTHROPLASTY WITH BICEPS TENODESIS.- RNFA;  Surgeon: Edie Norleen PARAS, MD;  Location: [facility];  Service: Orthopedics;  Laterality: Left;   SHOULDER ARTHROSCOPY Right [id number]   Dr. Kathi   STOMACH SURGERY  1941   3 arteries leaking in stomach after appendectomy   TONSILLECTOMY      Family History  Problem Relation Age of Onset   Pneumonia Mother    Rheum arthritis Father    Diabetes Father    Pneumonia Father    Diabetes Sister    Osteoarthritis Sister    Breast cancer Cousin     Social History   Tobacco Use   Smoking status: Never   Smokeless tobacco: Never  Substance Use Topics   Alcohol use: No    Alcohol/week: 0.0 standard drinks of alcohol     Current Outpatient Medications:    aspirin 81 MG EC tablet, Take 1 tablet by mouth daily., Disp: , Rfl:    azelastine (ASTELIN) 0.1 % nasal spray, Place 1-2 sprays into both nostrils 2 (two) times daily. Use in each nostril as directed, Disp: , Rfl:    cholecalciferol (VITAMIN D3) 25 MCG (1000 UNIT) tablet, Take 1,000 Units by mouth daily., Disp: , Rfl:    Cinnamon 500 MG capsule, Take 2,000 mg by mouth daily., Disp: , Rfl:    cyanocobalamin 100 MCG tablet, Take 1,000 mcg by mouth 2 (two) times a week., Disp: , Rfl:    dicyclomine   (BENTYL ) 20 MG tablet, Take 1 tablet (20 mg total) by mouth 3 (three) times daily as needed for spasms., Disp: 360 tablet, Rfl: 0   folic acid (FOLVITE) 1 MG tablet, Take 1 mg by mouth daily. , Disp: , Rfl:    methotrexate  (RHEUMATREX) 2.5 MG tablet, Take 20 mg by mouth once a week. Caution:Chemotherapy. Protect from light., Disp: , Rfl:    Multiple Vitamins-Minerals (MULTIVITAMIN ADULTS PO), Take 1 tablet by mouth daily. , Disp: , Rfl:    predniSONE  (DELTASONE ) 5 MG tablet, Take 5 mg by mouth daily., Disp: , Rfl:    vitamin C (ASCORBIC ACID) 500 MG tablet, Take 1 tablet by mouth at bedtime. , Disp: , Rfl:    amLODipine  (NORVASC ) 2.5 MG tablet, Take 1 tablet (2.5 mg total) by mouth 2 (two) times daily., Disp: 180 tablet, Rfl: 1   budesonide-formoterol (SYMBICORT) 160-4.5 MCG/ACT inhaler, Inhale 2 puffs into the lungs every morning. (Patient not taking: Reported on 05/05/2024), Disp: , Rfl:    fluticasone  (FLONASE ) 50 MCG/ACT nasal spray, USE 2 SPRAYS IN [facility]  NOSTRIL AT BEDTIME (Patient not taking: Reported on 05/05/2024), Disp: 48 g, Rfl: 3   glipiZIDE  (GLUCOTROL  XL) 5 MG 24 hr tablet, Take 1 tablet (5 mg total) by mouth every morning., Disp: 90 tablet, Rfl: 1   glucose blood (ONETOUCH ULTRA) test strip, 1 each by Other route daily. Use as instructed (Patient not taking: Reported on 05/05/2024), Disp: 100 each, Rfl: 2   loratadine  (CLARITIN ) 10 MG tablet, Take 10 mg by mouth daily. (Patient not taking: Reported on 05/05/2024), Disp: , Rfl:    metFORMIN  (GLUCOPHAGE -XR) 750 MG 24 hr tablet, TAKE 2 TABLETS (1,500 MG TOTAL) DAILY WITH BREAKFAST, Disp: 180 tablet, Rfl: 1   montelukast  (SINGULAIR ) 10 MG tablet, Take 1 tablet (10 mg total) by mouth at bedtime., Disp: 90 tablet, Rfl: 1   olmesartan  (BENICAR ) 40 MG tablet, Take 1 tablet (40 mg total) by mouth daily. In place of losartan, Disp: 90 tablet, Rfl: 1   pravastatin  (PRAVACHOL ) 40 MG tablet, Take 1 tablet (40 mg total) by mouth daily., Disp: 90  tablet, Rfl: 1  Allergies  Allergen Reactions   Doxycycline  Other (See Comments)    Weakness, flu like symptoms   Codeine     Dizzy   Lantus  [Insulin Glargine]     made skin hot   Oxycodone      Dizziness   Rosuvastatin      Myalgia    Penicillin G Rash    And dizziness   Sitagliptin Rash   Sulfa Antibiotics Rash    Other reaction(s):   Red burning skin   Wound Dressing Adhesive Rash    Surgical tape adhesive    I personally reviewed active problem list, medication list, allergies, family history with the patient/caregiver today.   ROS  Ten systems reviewed and is negative except as mentioned in HPI    Objective Physical Exam VITALS: BP- 174/96 CONSTITUTIONAL: Patient appears well-developed and well-nourished. No distress. HEENT: Head atraumatic, normocephalic, neck supple. CARDIOVASCULAR: Normal rate, regular rhythm and normal heart sounds. No murmur heard. No BLE edema. PULMONARY: Effort normal and breath sounds normal. Lungs clear to auscultation bilaterally. No respiratory distress. ABDOMINAL: There is no tenderness or distention. MUSCULOSKELETAL: Normal gait. Without gross motor or sensory deficit. PSYCHIATRIC: Patient has a normal mood and affect. Behavior is normal. Judgment and thought content normal.  Vitals:   05/05/24 1427 05/05/24 1452  BP: (!) 174/96 (!) 142/84  Pulse: 79   Resp: 16   SpO2: 93%   Weight: 179 lb 6.4 oz (81.4 kg)   Height: 5' 6 (1.676 m)     Body mass index is 28.96 kg/m.  Recent Results (from the past 2160 hours)  CBC and differential     Status: None   Collection Time: 02/13/24 12:00 AM  Result Value Ref Range   Hemoglobin 14.5 12.0 - 16.0   HCT 42 36 - 46   Neutrophils Absolute 9.40    Platelets 295 150 - 400 K/uL   WBC 12.8   CBC     Status: None   Collection Time: 02/13/24 12:00 AM  Result Value Ref Range   RBC 4.51 3.87 - 5.11  POCT erythrocyte sed rate, Non-automated     Status: None   Collection Time: 02/13/24  12:00 AM  Result Value Ref Range   Sed Rate 6   Basic metabolic panel with GFR     Status: Abnormal   Collection Time: 02/13/24 12:00 AM  Result Value Ref Range   Glucose 157  BUN 14 4 - 21   CO2 26 (A) 13 - 22   Creatinine 0.9 0.5 - 1.1   Potassium 3.2 (A) 3.5 - 5.1 mEq/L   Sodium 145 137 - 147   Chloride 101 99 - 108  Comprehensive metabolic panel with GFR     Status: None   Collection Time: 02/13/24 12:00 AM  Result Value Ref Range   Globulin 2.1    eGFR 63    Calcium  10.0 8.7 - 10.7   Albumin 4.5 3.5 - 5.0  Hepatic function panel     Status: None   Collection Time: 02/13/24 12:00 AM  Result Value Ref Range   Alkaline Phosphatase 85 25 - 125   ALT 17 7 - 35 U/L   AST 23 13 - 35   Bilirubin, Total 0.6   POCT glycosylated hemoglobin (Hb A1C)     Status: Abnormal   Collection Time: 05/05/24  2:32 PM  Result Value Ref Range   Hemoglobin A1C 7.2 (A) 4.0 - 5.6 %   HbA1c POC (<> result, manual entry)     HbA1c, POC (prediabetic range)     HbA1c, POC (controlled diabetic range)       PHQ2/9:    05/05/2024    2:18 PM 12/29/2023    3:04 PM 08/19/2023    1:47 PM 04/23/2023    2:30 PM 04/23/2023    2:23 PM  Depression screen PHQ 2/9  Decreased Interest 0 2 3 0 0  Down, Depressed, Hopeless 0 2 3 0 0  PHQ - 2 Score 0 4 6 0 0  Altered sleeping 0 0 3 0   Tired, decreased energy 0 0 3 0   Change in appetite 0 0 3 0   Feeling bad or failure about yourself  0 0 0 0   Trouble concentrating 0 3 3 0   Moving slowly or fidgety/restless 0 0 0 0   Suicidal thoughts 0 0 0 0   PHQ-9 Score 0 7  18  0    Difficult doing work/chores Not difficult at all Very difficult Very difficult Not difficult at all      Data saved with a previous flowsheet row definition    phq 9 is negative  Fall Risk:    05/05/2024    2:18 PM 12/29/2023    2:53 PM 08/19/2023    1:41 PM 04/23/2023    2:22 PM 10/23/2022   11:33 AM  Fall Risk   Falls in the past year? 0 0 0 0 0  Number falls in past yr: 0  0 0 0 0  Injury with Fall? 0 0 0 0 0  Risk for fall due to : No Fall Risks No Fall Risks No Fall Risks No Fall Risks No Fall Risks  Follow up Falls evaluation completed Falls evaluation completed Falls prevention discussed;Education provided;Falls evaluation completed Falls prevention discussed Falls prevention discussed    Assessment & Plan Hypertension with chronic kidney disease stage 3 Blood pressure elevated at 174/96 mmHg, likely due to decongestants. CKD stage 3 with improved kidney function but low potassium. - Continue olmesartan  40 mg daily. - Increase amlodipine  to twice daily. - Follow-up blood pressure check in two weeks. - Order blood tests for kidney function, cholesterol, and urine protein. - Advise avoiding decongestants, use saline spray.  Type 2 diabetes mellitus with associated HTN and dyslipidemia A1c increased from 6.7% to 7.2%, indicating worsening glycemic control. - Continue glipizide  5 mg daily. - Continue metformin   as prescribed. - Order blood test for cholesterol levels.  Polymyalgia rheumatica Symptoms returned after prednisone  taper, resumed medication. - Continue prednisone  as prescribed.  Arthropathic psoriasis affecting right hand  Depression Moderate depression, feeling unhappy and homesick. Does not want to take medications  Asthma Advise use of Symbicort and montelukast  as needed, especially during colds.  Acute upper respiratory infection Cold symptoms persisting for one week, fatigue and mild rib pain from coughing. - Advise avoiding decongestants, use saline spray.  General Health Maintenance Discussed preventive care and vaccinations. - Advise shingles vaccine at local pharmacy. - Schedule Medicare wellness exam.

## 2024-05-06 LAB — COMPREHENSIVE METABOLIC PANEL WITH GFR
AG Ratio: 1.8 (calc) (ref 1.0–2.5)
ALT: 13 U/L (ref 6–29)
AST: 19 U/L (ref 10–35)
Albumin: 4.2 g/dL (ref 3.6–5.1)
Alkaline phosphatase (APISO): 71 U/L (ref 37–153)
BUN: 16 mg/dL (ref 7–25)
CO2: 33 mmol/L — ABNORMAL HIGH (ref 20–32)
Calcium: 9.4 mg/dL (ref 8.6–10.4)
Chloride: 96 mmol/L — ABNORMAL LOW (ref 98–110)
Creat: 0.9 mg/dL (ref 0.60–1.00)
Globulin: 2.3 g/dL (ref 1.9–3.7)
Glucose, Bld: 196 mg/dL — ABNORMAL HIGH (ref 65–139)
Potassium: 2.8 mmol/L — ABNORMAL LOW (ref 3.5–5.3)
Sodium: 141 mmol/L (ref 135–146)
Total Bilirubin: 0.8 mg/dL (ref 0.2–1.2)
Total Protein: 6.5 g/dL (ref 6.1–8.1)
eGFR: 65 mL/min/1.73m2 (ref >=60)

## 2024-05-06 LAB — MICROALBUMIN / CREATININE URINE RATIO
Creatinine, Urine: 58 mg/dL (ref 20–275)
Microalb Creat Ratio: 17 mg/g{creat} (ref <30)
Microalb, Ur: 1 mg/dL

## 2024-05-06 LAB — LIPID PANEL
Cholesterol: 173 mg/dL (ref <200)
HDL: 60 mg/dL (ref >=50)
LDL Cholesterol (Calc): 90 mg/dL
Non-HDL Cholesterol (Calc): 113 mg/dL (ref <130)
Total CHOL/HDL Ratio: 2.9 (calc) (ref <5.0)
Triglycerides: 124 mg/dL (ref <150)

## 2024-05-07 ENCOUNTER — Ambulatory Visit: Payer: Self-pay | Admitting: Family Medicine

## 2024-05-07 ENCOUNTER — Other Ambulatory Visit: Payer: Self-pay | Admitting: Family Medicine

## 2024-05-07 MED ORDER — POTASSIUM CHLORIDE CRYS ER 20 MEQ PO TBCR: 20.0000 meq | EXTENDED_RELEASE_TABLET | Freq: Every day | ORAL | 0 refills | Status: DC

## 2024-05-09 ENCOUNTER — Emergency Department

## 2024-05-09 ENCOUNTER — Emergency Department
Admission: EM | Admit: 2024-05-09 | Discharge: 2024-05-09 | Disposition: A | Discharge status: Home / Self Care | Attending: Emergency Medicine | Admitting: Emergency Medicine

## 2024-05-09 ENCOUNTER — Other Ambulatory Visit: Payer: Self-pay | Admitting: Family Medicine

## 2024-05-09 ENCOUNTER — Other Ambulatory Visit: Payer: Self-pay

## 2024-05-09 DIAGNOSIS — D72829 Elevated white blood cell count, unspecified: Secondary | ICD-10-CM | POA: Insufficient documentation

## 2024-05-09 DIAGNOSIS — I10 Essential (primary) hypertension: Secondary | ICD-10-CM | POA: Diagnosis not present

## 2024-05-09 DIAGNOSIS — E876 Hypokalemia: Secondary | ICD-10-CM | POA: Diagnosis not present

## 2024-05-09 DIAGNOSIS — M2559 Pain in other specified joint: Secondary | ICD-10-CM | POA: Diagnosis not present

## 2024-05-09 DIAGNOSIS — R079 Chest pain, unspecified: Secondary | ICD-10-CM | POA: Diagnosis present

## 2024-05-09 LAB — TROPONIN T, HIGH SENSITIVITY
Troponin T High Sensitivity: 22 ng/L — ABNORMAL HIGH (ref 0–19)
Troponin T High Sensitivity: 18 ng/L (ref 0–19)

## 2024-05-09 LAB — CBC
HCT: 39.8 % (ref 36.0–46.0)
Hemoglobin: 13.8 g/dL (ref 12.0–15.0)
MCH: 30.9 pg (ref 26.0–34.0)
MCHC: 34.7 g/dL (ref 30.0–36.0)
MCV: 89 fL (ref 80.0–100.0)
Platelets: 309 K/uL (ref 150–400)
RBC: 4.47 MIL/uL (ref 3.87–5.11)
RDW: 12.8 % (ref 11.5–15.5)
WBC: 11.7 K/uL — ABNORMAL HIGH (ref 4.0–10.5)
nRBC: 0 % (ref 0.0–0.2)

## 2024-05-09 LAB — BASIC METABOLIC PANEL WITH GFR
Anion gap: 11 (ref 5–15)
BUN: 12 mg/dL (ref 8–23)
CO2: 29 mmol/L (ref 22–32)
Calcium: 9.1 mg/dL (ref 8.9–10.3)
Chloride: 100 mmol/L (ref 98–111)
Creatinine, Ser: 0.74 mg/dL (ref 0.44–1.00)
GFR, Estimated: >60 mL/min (ref >60)
Glucose, Bld: 239 mg/dL — ABNORMAL HIGH (ref 70–99)
Potassium: 2.6 mmol/L — CL (ref 3.5–5.1)
Sodium: 141 mmol/L (ref 135–145)

## 2024-05-09 LAB — HEPATIC FUNCTION PANEL
ALT: 14 U/L (ref 0–44)
AST: 23 U/L (ref 15–41)
Albumin: 4.2 g/dL (ref 3.5–5.0)
Alkaline Phosphatase: 75 U/L (ref 38–126)
Bilirubin, Direct: 0.4 mg/dL — ABNORMAL HIGH (ref 0.0–0.2)
Indirect Bilirubin: 0.7 mg/dL (ref 0.3–0.9)
Total Bilirubin: 1.1 mg/dL (ref 0.0–1.2)
Total Protein: 6.7 g/dL (ref 6.5–8.1)

## 2024-05-09 LAB — CK: Total CK: 52 U/L (ref 38–234)

## 2024-05-09 LAB — SEDIMENTATION RATE: Sed Rate: 5 mm/h (ref 0–30)

## 2024-05-09 LAB — MAGNESIUM: Magnesium: 1.5 mg/dL — ABNORMAL LOW (ref 1.7–2.4)

## 2024-05-09 MED ORDER — POTASSIUM CHLORIDE 10 MEQ/100ML IV SOLN
10.0000 meq | INTRAVENOUS | Status: AC
Start: 2024-05-09 — End: 2024-05-09
  Administered 2024-05-09 (×2): 10 meq via INTRAVENOUS
  Filled 2024-05-09 (×2): qty 100

## 2024-05-09 MED ORDER — MAGNESIUM SULFATE 2 GM/50ML IV SOLN
2.0000 g | Freq: Once | INTRAVENOUS | Status: AC
  Administered 2024-05-09: 2 g via INTRAVENOUS
  Filled 2024-05-09: qty 50

## 2024-05-09 MED ORDER — POTASSIUM CHLORIDE 20 MEQ PO PACK
40.0000 meq | PACK | Freq: Every day | ORAL | Status: DC
  Administered 2024-05-09: 40 meq via ORAL
  Filled 2024-05-09: qty 2

## 2024-05-09 MED ORDER — KETOROLAC TROMETHAMINE 15 MG/ML IJ SOLN
15.0000 mg | Freq: Once | INTRAMUSCULAR | Status: AC
  Administered 2024-05-09: 15 mg via INTRAVENOUS
  Filled 2024-05-09: qty 1

## 2024-05-09 NOTE — ED Triage Notes (Signed)
 Pt to Ed via POV from home. Pt ambulatory to lobby and placed in wheelchair by registration. Pt reports right sided CP that has been constant with movement since Friday. Pt presents with right arm in sling. Pt denies injuries. Pt states feels better for arm. Pt reports 2wks ago started with right hand pain and has progressed up arm. Hx ox RA. No blood thinners.  Pt states recent blood work showing elevated glucose and kidney damage.

## 2024-05-09 NOTE — Discharge Instructions (Addendum)
-  Take the potassium pills 20 mg twice daily for 3 days (starting tomorrow) then continue once daily but have this rechecked with PCP in one week.  -Your blood pressure is elevated and you should take the increased dosing of amlodipine  that your doctor had recommended. -For your joint pain take 400 mg of ibuprofen  every 6-8 hours with food.  If this is not working she can increase to 600 mg every 6-8 hours with food.  Do this until she follows up with rheumatology.  Rheumatology may have additional thoughts on increasing steroids or other things that they can try if she is continuing to have discomfort.    IMPRESSION: 1. Mild soft tissue swelling about the wrist. 2. Faint soft tissue calcifications along the volar aspect of the radiocarpal joint and the dorsal aspect of the second distal interphalangeal joint.  X-rays were normal otherwise but your results were as below that showed some chronic findings.  You should follow-up with your rheumatologist for the calcifications and if you feel that the pain is more in the shoulder then you need to follow-up with an orthopedic doctor to discuss shoulder replacement.  You can continue to wear the sling for comfort but do not leave it on 24/7 as you do not want to get a frozen  shoulder.   Elbow negative  IMPRESSION: 1. Posttraumatic deformity of the humeral head with moderate degenerative spurring. 2. Calcification within the rotator cuff. 3. Widening of the acromioclavicular joint, which appears to be chronic. 4. No acute fracture evident.

## 2024-05-09 NOTE — ED Provider Notes (Signed)
 [facility] Provider Note    Event Date/Time   First MD Initiated Contact with Patient 05/09/24 [phone]     (approximate)   History   Chest Pain   HPI  Mary Cantrell is a 78 y.o. female with a history of rheumatoid arthritis asthma, hypertension who comes in with concerns for chest pain.  Reviewed patient's medication list and she is on methotrexate , prednisone .  She reports worsening pain that started initially in her right hand but has moved up her arm.  She reports now some pain in her right chest.  She reports the pain is not there she stays completely still but she only has pain if she moves her arm.  She presents in a sling that they put on themselves as they report that if her arm is in a sling then she has resolution of symptoms.  They report that they have an appointment with her rheumatologist but is not for a few days and due to the pain worsening that is why they presented to the emergency room.  She denies any shortness of breath, swelling her legs, history of blood clots.  She denies noticing her arm being more swollen in nature.  Denies any falls.   Physical Exam   Triage Vital Signs: ED Triage Vitals  Encounter Vitals Group     BP 05/09/24 0748 (!) 139/114     Girls Systolic BP Percentile --      Girls Diastolic BP Percentile --      Boys Systolic BP Percentile --      Boys Diastolic BP Percentile --      Pulse Rate 05/09/24 0748 94     Resp 05/09/24 0748 17     Temp 05/09/24 0748 98 F (36.7 C)     Temp Source 05/09/24 0748 Oral     SpO2 05/09/24 0748 99 %     Weight --      Height --      Head Circumference --      Peak Flow --      Pain Score 05/09/24 0749 10     Pain Loc --      Pain Education --      Exclude from Growth Chart --     Most recent vital signs: Vitals:   05/09/24 0748  BP: (!) 139/114  Pulse: 94  Resp: 17  Temp: 98 F (36.7 C)  SpO2: 99%     General: Awake, no distress.  CV:  Good peripheral perfusion.   Resp:  Normal effort.  Abd:  No distention.  Other:  Good distal pulse.  Sensation is intact.  Able to range her fingers.  She reports pain with movement mostly of lifting the arm pain radiating to the right chest wall.  Some pain with palpation on the right chest wall.  She does have a little mild swelling noted of the hand.  No obvious rashes noted.   ED Results / Procedures / Treatments   Labs (all labs ordered are listed, but only abnormal results are displayed) Labs Reviewed  BASIC METABOLIC PANEL WITH GFR  CBC  TROPONIN T, HIGH SENSITIVITY     EKG  My interpretation of EKG:  Sinus tachycardia rate of 100 without any ST elevation or T wave inversions, normal intervals  RADIOLOGY I have reviewed the xray personally and interpreted no obvious fracture   PROCEDURES:  Critical Care performed: No  .1-3 Lead EKG Interpretation  Performed by: Ernest Ronal BRAVO,  MD Authorized by: Ernest Ronal BRAVO, MD     Interpretation: normal     ECG rate:  70   ECG rate assessment: normal     Rhythm: sinus rhythm     Ectopy: none     Conduction: normal      MEDICATIONS ORDERED IN ED: Medications  potassium chloride  (KLOR-CON ) packet 40 mEq (40 mEq Oral Given 05/09/24 1121)  ketorolac  (TORADOL ) 15 MG/ML injection 15 mg (15 mg Intravenous Given 05/09/24 0835)  potassium chloride  10 mEq in 100 mL IVPB (0 mEq Intravenous Stopped 05/09/24 1123)  magnesium sulfate IVPB 2 g 50 mL (0 g Intravenous Stopped 05/09/24 1123)     IMPRESSION / MDM / ASSESSMENT AND PLAN / ED COURSE  I reviewed the triage vital signs and the nursing notes.   Patient's presentation is most consistent with acute presentation with potential threat to life or bodily function.   Patient comes in with chest pain.  Suspect that this is more likely related to musculoskeletal as it is worse with certain movements.  Will treat patient with some Toradol  as I reviewed some blood work that she had done recently where she had  normal creatinine.  I will get a sed rate to evaluate for worsening rheumatoid arthritis, infection.  We also get a ultrasound DVT given there is a little bit of speech swelling noted in the right hand.  This seems less likely cardiac given his pain in the chest that is worse with movement of the arm but troponin was ordered due to patient's age to ensure no ACS.  Chest x-ray to make sure no pneumothorax.  Does not sound consistent with dissection as she reports that at rest she has no pain but when she lifts up her arm she experiences pain in her chest.  Patient is hypertensive but I suspect related more to pain.     IMPRESSION: 1. Mild soft tissue swelling about the wrist. 2. Faint soft tissue calcifications along the volar aspect of the radiocarpal joint and the dorsal aspect of the second distal interphalangeal joint.  Elbow negative  IMPRESSION: 1. Posttraumatic deformity of the humeral head with moderate degenerative spurring. 2. Calcification within the rotator cuff. 3. Widening of the acromioclavicular joint, which appears to be chronic. 4. No acute fracture evident.  Chest negative  CBC shows slightly elevated white count of 11.7  Ultrasound DVT was negative.  CK was negative.  Magnesium was low.  Patient getting some IV magnesium and potassium.  Patient reports near resolution of symptoms after Toradol .  She is moving her arm all around without any significant pain.  Her cardiac markers were discussed and were downtrending and her pain was not only with movement so seems less likely ACS I suspect is related to elevated blood pressure which I suspect is related to patient's pain but she also reports that her doctor recently increased her amlodipine  but she has not started that.  We discussed her low magnesium and potassium and she denies any weakness, difficulty walking.  She does report being sick last week with a little bit of an illness and reports a little bit of decreased  p.o. intake.  She reports that she actually had blood work done with her primary care doctor recently that showed a low potassium they started her on potassium pills but she has only taken maybe 2 of them over the past couple of days.  We discussed admission to the hospital for this but given she reports tolerating p.o.  and no significant symptoms she prefer outpatient management.  I have given 20 of IV potassium 2 g of IV mag and 40 of p.o. that would hopefully raise her up to about 3.2 we discussed that low potassiums can lead to cardiac issues but at this time she is asymptomatic and prefers to to continue potassium pills at home.  I given she was only prescribed 20 once a day we did discuss doing 20 twice a day for 3 days and following up for recheck with her primary care doctor.  We also discussed using ibuprofen  400 every 6-8 hours to help with pain.  She has a rheumatology follow-up on Friday and they can discuss if they would like to do steroids.  I suspect this is more likely related to her known arthritis given it is affecting multiple joints.  Seems less likely consistent with septic joint no fever no significant white count ESR is negative.  Do not feel like there is any joint that I could tap given I do not feel any obvious effusion.   The patient is on the cardiac monitor to evaluate for evidence of arrhythmia and/or significant heart rate changes.      FINAL CLINICAL IMPRESSION(S) / ED DIAGNOSES   Final diagnoses:  Hypokalemia  Hypomagnesemia  Hypertension, unspecified type  Pain in other joint     Rx / DC Orders   ED Discharge Orders     None        Note:  This document was prepared using Dragon voice recognition software and may include unintentional dictation errors.   Ernest Ronal BRAVO, MD 05/09/24 [phone]

## 2024-05-11 ENCOUNTER — Ambulatory Visit: Admitting: Family Medicine

## 2024-05-11 NOTE — Progress Notes (Signed)
 [facility] New Consult Visit  Patient Name: Mary Cantrell, female   Patient DOB: August 09, 1945 Date of Service: 05/11/2024  Patient MRN: [id number] Provider Creating Note: Pinkey Edman, MD  [phone] Primary Care Physician: Glenard Dorette FALCON, MD  [address] [place] [address] Additional Physicians/ Providers:    History of Present Illness Mary Cantrell is a 78 y.o. female with past medical history of rheumatoid arthritis has been on methotrexate , diabetes, hyperlipidemia, asthma, hypertension now sent for renal evaluation for persistent hypokalemia.  She was recently admitted to the hospital with severe hypokalemia and muscle cramps and also found to have hypomagnesemia.  She has a history of hypokalemia which has been documented for the last 2 years. She was recently started on potassium supplementation. She also has been on prednisone  along with methotrexate  for exacerbation and symptoms of rheumatoid arthritis. Denies any use of NSAIDS.  Denies any chest pain, SOB or DOE.  Denies any history of fever, chills or head aches. Denies any history of kidney stone disease or UTI.  Denies an history of urinary symptoms of U/F or Dysuria.  Denies any family history of renal disease.  IV Contrast exposure recently: Denies Smoking history: Denies Alcohol history: Denies   Medications   Current Outpatient Medications:  .  amLODIPine  (NORVASC ) 2.5 MG tablet, Take 2.5 mg by mouth, Disp: , Rfl:  .  ascorbic acid (VITAMIN C) 500 MG tablet, Take 500 mg by mouth at bed time, Disp: , Rfl:  .  aspirin (ST JOSEPH) 81 MG EC tablet, Take 81 mg by mouth 1 (one) time each day, Disp: , Rfl:  .  budesonide-formoterol (SYMBICORT) 160-4.5 MCG/ACT inhaler, Inhale 2 puffs, Disp: , Rfl:  .  Cholecalciferol (Vitamin D ) 25 MCG (1000 UT) tablet, Take 1,000 Units by mouth 1 (one) time each day, Disp: , Rfl:  .  cinnamon 500 MG capsule, Take 2,000 mg by mouth, Disp: , Rfl:  .   cyanocobalamin (VITAMIN B-12) 100 MCG tablet, Take 1,000 mcg by mouth, Disp: , Rfl:  .  dicyclomine  (BENTYL ) 20 MG tablet, Take 20 mg by mouth, Disp: , Rfl:  .  fluticasone  (FLONASE ) 50 MCG/ACT nasal spray, USE 2 SPRAYS IN EACH NOSTRIL AT BEDTIME, Disp: , Rfl:  .  folic acid (FOLVITE) 1 MG tablet, Take 1 mg by mouth 1 (one) time each day, Disp: , Rfl:  .  glipiZIDE  (GLUCOTROL  XL) 5 MG 24 hr tablet, Take 5 mg by mouth every morning, Disp: , Rfl:  .  metFORMIN  XR (GLUCOPHAGE -XR) 750 MG 24 hr tablet, TAKE 2 TABLETS (1,500 MG TOTAL) DAILY WITH BREAKFAST, Disp: , Rfl:  .  methotrexate  2.5 MG tablet, Take 20 mg by mouth, Disp: , Rfl:  .  Multiple Vitamins-Minerals tablet, Take 1 tablet by mouth 1 (one) time each day, Disp: , Rfl:  .  olmesartan  (BENICAR ) 40 MG tablet, Take 40 mg by mouth, Disp: , Rfl:  .  potassium chloride  (KLOR-CON  M20) 20 MEQ CR tablet, Take 20 mEq by mouth, Disp: , Rfl:  .  pravastatin  (PRAVACHOL ) 40 MG tablet, Take 40 mg by mouth 1 (one) time each day, Disp: , Rfl:  .  predniSONE  5 MG tablet, Take 5 mg by mouth 1 (one) time each day, Disp: , Rfl:    Allergies Doxycycline , Codeine, Gabapentin , Metformin , Oxycodone , Rosuvastatin , Insulin glargine, Penicillins, Sitagliptin, Sulfa antibiotics, and Wound dressing adhesive  Problem List Patient Active Problem List  Diagnosis  . Hypomagnesemia  . Pure hypercholesterolemia, not otherwise  specified  . Hypokalemia  . 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 Hypertension  . 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 Uncomplicated mild intermittent asthma, not otherwise specified     Review of Systems  Constitutional: Negative.   HENT: Negative.    Eyes: Negative.   Respiratory: Negative.    Cardiovascular: Negative.   Gastrointestinal: Negative.   Genitourinary: Negative.   Musculoskeletal:  Positive for joint pain.  Skin: Negative.       History Past Medical History:  Diagnosis Date  . Chronic kidney disease   . Diabetes mellitus without mention of complication, type II or unspecified type, not stated as uncontrolled (HCC)   . Essential hypertension   . Osteoarthrosis, unspecified whether generalized or localized, involving unspecified site   . Other and unspecified hyperlipidemia   . Sleep apnea   . Stenosis of left carotid artery     History reviewed. No pertinent surgical history. Family History  Problem Relation Age of Onset  . Dementia Mother   . Gout Father   . Stroke Father   . Hypertension Father   . Diabetes Father    Social History   Tobacco Use  . Smoking status: Never  . Smokeless tobacco: Never  Substance Use Topics  . Alcohol use: Not Currently     Physical Exam  Vitals BP 150/80 (BP Location: Right upper arm, Patient Position: Sitting)   Pulse 90   Temp 98.3 F   Ht 5' 6 (1.676 m)   Wt 178 lb (80.7 kg)   SpO2 90%   BMI 28.73 kg/m   PHYSICAL EXAM: General appearance: well developed, well nourished, NAD Eyes: anicteric sclerae, moist conjunctivae; no lid-lag  Neck: Trachea midline; supple Lungs: CTAB, with normal respiratory effort  CV: RRR, no MRGs  Abdomen: Soft, non-tender; bowel sounds present Extremities: No peripheral edema, cyanosis, or clubbing. Skin: Warm and dry, normal skin turgor, no rashes noted.   Laboratory Studies   Basic metabolic panel Component 05/09/24 <redacted file path> 05/05/24 <redacted file path> 02/13/24 <redacted file path> 02/13/24 <redacted file path> 08/26/23 <redacted file path> 04/23/23 <redacted file path>  Sodium 141 141 <redacted file path> 145 <redacted file path> -- 138 <redacted file path> 143 <redacted file path>  Potassium 2.6 Low Panic   2.8 <redacted file path> Low  3.2 <redacted file path> Abnormal  -- 2.8 <redacted file path> Low  3.4 <redacted file path> Low   Chloride 100 96 <redacted file path> Low  101 <redacted file path> --  99 <redacted file path> 104 <redacted file path>  CO2 29 33 <redacted file path> High  26 <redacted file path> Abnormal  -- 26 <redacted file path> 29 <redacted file path>  Glucose, Bld 239 High   196 <redacted file path> High   -- -- 208 <redacted file path> High   112 <redacted file path> High    BUN 12 16 <redacted file path> 14 <redacted file path> -- 13 <redacted file path> 17 <redacted file path>  Creatinine, Ser 0.74 -- -- -- 0.84 <redacted file path> --  Calcium  9.1 9.4 <redacted file path> -- 10.0 <redacted file path> 9.1 <redacted file path> 9.5 <redacted file path>  GFR, Estimated >60  -- -- -- >60 <redacted file path>  --    CBC Component 05/09/24 <redacted file path> 02/13/24 <redacted file path> 02/13/24 <redacted file path> 08/26/23 <redacted file path> 04/23/23 <redacted file path> 01/08/23 <redacted file  path>  WBC 11.7 High  12.8 <redacted file path> -- 10.0 <redacted file path> 8.7 <redacted file path> 10.0 <redacted file path>  RBC 4.47 -- 4.51 <redacted file path> 4.29 <redacted file path> 4.09 <redacted file path> --  Hemoglobin 13.8 14.5 <redacted file path> -- 13.7 <redacted file path> 13.2 <redacted file path> --  HCT 39.8 42 <redacted file path> -- 39.1 <redacted file path> 39.1 <redacted file path> 39 <redacted file path>  MCV 89.0 -- -- 91.1 <redacted file path> 95.6 <redacted file path> --  MCH 30.9 -- -- 31.9 <redacted file path> 32.3 <redacted file path> --  MCHC 34.7 -- -- 35.0 <redacted file path> 33.8 <redacted file path>  --  RDW 12.8 -- -- 12.8 <redacted file path> 14.0 <redacted file path> --  Platelets 309 295 <redacted file path> -- 262 <redacted file path> 314 <redacted file path> 321 <redacted file path>    Imaging and Other Studies    Problem List Items Addressed This Visit     Hypomagnesemia   Pure hypercholesterolemia, not otherwise specified   Hypokalemia (Chronic)   Type 2 diabetes mellitus with diabetic chronic kidney disease, not  otherwise specified (HCC)   Hypertension - Primary   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       Impression/Recommendations   Mary Cantrell is a 78 y.o. female with past medical history of rheumatoid arthritis has been on methotrexate , diabetes, hyperlipidemia, asthma, hypertension now sent for renal evaluation for persistent hypokalemia. She was recently admitted to the hospital with severe hypokalemia and muscle cramps and also found to have hypomagnesemia. She also has been on prednisone  along with methotrexate  for exacerbation and symptoms of rheumatoid arthritis.  #1: Hypokalemia: Hypokalemia is a known side effect of methotrexate .  This was probably exacerbated by the addition of prednisone .  This is probably why she has hypokalemic metabolic alkalosis.  I advised the patient to continue potassium supplementation.  She needs to have regular blood work at least twice a month to check potassium and magnesium.  #2: Hypertension: Patient has been on amlodipine  and olmesartan .  She is advised to stay on 2 g salt restricted diet.  We may need to add a beta-blocker for better control of her hypertension.  Patient is advised to check her blood pressure at least once a day.  #3: Diabetes: She has been on glipizide  and metformin  diet advised to the patient.  Spoke to the patient and her husband at bedside in detail and answered all their questions to their satisfaction. She is advised to avoid nonsteroidal anti-inflammatory drugs.   Prescription medications new and current medication dosages, including patient education, medication names, potential side effects, drug interactions, consequences of non compliance were discussed with the patient in detail. Patient expressed understanding.  Spoke to the patient in detail regarding the  status of patient's renal function, answered all questions to the patient's satisfaction.  Dear Dr.Sowles,  I Sincerely appreciate the opportunity to participate in the care of your very pleasant patient.  Please do not hesitate to contact me with any questions or concerns that may arise in regards to the patient's Nephrological care.   Return in about 6 months (around 11/08/2024).  Disclaimer: Much of the narrative of this dictation was acquired using speech recognition software. It is possible that some dictated speech was not transcribed accurately by this  system nor detected in the proofing. Such errors are at times unavoidable.    Pinkey Edman, MD [facility] Ph: [phone] Fax: [phone] 05/11/2024

## 2024-05-12 ENCOUNTER — Inpatient Hospital Stay: Admitting: Family Medicine

## 2024-05-12 ENCOUNTER — Encounter: Payer: Self-pay | Admitting: Family Medicine

## 2024-05-12 ENCOUNTER — Ambulatory Visit (INDEPENDENT_AMBULATORY_CARE_PROVIDER_SITE_OTHER): Admitting: Family Medicine

## 2024-05-12 VITALS — BP 142/80 | HR 92 | Resp 16 | Ht 66.0 in | Wt 179.4 lb

## 2024-05-12 DIAGNOSIS — I129 Hypertensive chronic kidney disease with stage 1 through stage 4 chronic kidney disease, or unspecified chronic kidney disease: Secondary | ICD-10-CM

## 2024-05-12 DIAGNOSIS — R790 Abnormal level of blood mineral: Secondary | ICD-10-CM

## 2024-05-12 DIAGNOSIS — I10 Essential (primary) hypertension: Secondary | ICD-10-CM | POA: Diagnosis not present

## 2024-05-12 DIAGNOSIS — N183 Chronic kidney disease, stage 3 unspecified: Secondary | ICD-10-CM

## 2024-05-12 DIAGNOSIS — E876 Hypokalemia: Secondary | ICD-10-CM

## 2024-05-12 MED ORDER — NEBIVOLOL HCL 10 MG PO TABS: 10.0000 mg | ORAL_TABLET | Freq: Every day | ORAL | 0 refills | Status: AC

## 2024-05-12 NOTE — Progress Notes (Signed)
 Name: Mary Cantrell   MRN: [id number]    DOB: 09/06/1945   Date:05/12/2024       Progress Note  Subjective  Chief Complaint  Chief Complaint  Patient presents with   Chest Pain    Seen at ER, continues to have some discomfort on R side   Discussed the use of AI scribe software for clinical note transcription with the patient, who gave verbal consent to proceed.  History of Present Illness Mary Cantrell is a 78 year old female with chronic hypokalemia and hypertension who presents with recent episodes of severe pain and electrolyte imbalance.  She experienced a recent episode of severe right-sided pain, described as excruciating, which affected her ability to make a fist. This episode occurred on Sunday morning and was associated with high blood pressure. She was taken to the emergency room where intravenous potassium and magnesium  were administered, alleviating the pain within half an hour.  She has a history of chronic hypokalemia and is currently taking methotrexate  and prednisone . During the recent episode, her potassium level was critically low at 2.6 mEq/L. Her magnesium  levels were also low.  Her blood pressure has been elevated. She has not checked her blood pressure at home recently.  She has a history of psoriatic arthritis, which may contribute to her joint pain. She has difficulty making a fist due to pain, which she initially attributed to arthritis. She is scheduled to see her rheumatologist on Friday for a re-evaluation of her methotrexate  and prednisone  regimen.  She has experienced cramps, particularly in the groin area, which she attributes to low potassium levels. She acknowledges not drinking enough water recently, which may contribute to dehydration and cramping.  Her diabetes management appears stable, with no recent episodes of low blood sugar.    Patient Active Problem List   Diagnosis Date Noted   Allergic sinusitis 04/23/2023   Moderate episode of recurrent major  depressive disorder (HCC) 04/23/2023   Type 2 diabetes mellitus with obesity 12/16/2022   Mild intermittent asthma without complication 07/24/2022   Hypertension, benign 08/22/2021   Long term (current) use of systemic steroids 08/22/2021   Lumbar spondylosis 08/22/2021   Osteoarthritis 08/22/2021   DDD (degenerative disc disease), lumbar 10/24/2020   Neuropathy 02/27/2020   Sensory ataxia 02/27/2020   Tremor 02/27/2020   Chronic kidney disease, stage III (moderate) (HCC) 12/09/2018   Dyslipidemia associated with type 2 diabetes mellitus (HCC) 12/09/2018   Obesity (BMI 30.0-34.9) 07/17/2018   Hypertension associated with stage 2 chronic kidney disease due to type 2 diabetes mellitus (HCC) 03/15/2018   Psoriatic arthritis (HCC) 12/04/2017   Stenosis of left carotid artery 12/16/2016   Polymyalgia rheumatica 04/13/2015   Back pain, chronic 12/21/2014   Narrowing of intervertebral disc space 12/21/2014   Diabetic gastroparesis (HCC) 12/21/2014   Amaurosis fugax of left eye 12/21/2014   H/O: hypothyroidism 12/21/2014   Hyperlipidemia 12/21/2014   IBS (irritable bowel syndrome) 12/21/2014   Disorder of macula of retina 12/21/2014   Asthma, moderate 12/21/2014   NASH (nonalcoholic steatohepatitis) 12/21/2014   NS (nuclear sclerosis) 12/21/2014   Osteoarthrosis 12/21/2014   Psoriasis 12/21/2014   Knee torn cartilage 12/21/2014   OSA on CPAP 01/03/2014   History of shoulder surgery 04/23/2011    Past Surgical History:  Procedure Laterality Date   ABDOMINAL HYSTERECTOMY     APPENDECTOMY     ARTERY BIOPSY N/A 02/23/2015   Procedure: BIOPSY TEMPORAL ARTERY;  Surgeon: Selinda GORMAN Gu, MD;  Location: [facility];  Service: Vascular;  Laterality: N/A;   BREAST BIOPSY Left    1990's   CATARACT EXTRACTION W/PHACO Left 07/24/2021   Procedure: CATARACT EXTRACTION PHACO AND INTRAOCULAR LENS PLACEMENT (IOC) LEFT DIABETIC 8.37 00:50.0;  Surgeon: Jaye Fallow, MD;  Location: [facility];   Service: Ophthalmology;  Laterality: Left;  Diabetic   CATARACT EXTRACTION W/PHACO Right 08/07/2021   Procedure: CATARACT EXTRACTION PHACO AND INTRAOCULAR LENS PLACEMENT (IOC) RIGHT DIABETIC 7.29 00:48.4;  Surgeon: Jaye Fallow, MD;  Location: [facility];  Service: Ophthalmology;  Laterality: Right;  Diabetic   EXCISION / CURETTAGE BONE CYST FINGER     Left Index Finger   PILONIDAL CYST EXCISION     REVERSE SHOULDER ARTHROPLASTY Left 08/27/2022   Procedure: REVERSE SHOULDER ARTHROPLASTY WITH BICEPS TENODESIS.- RNFA;  Surgeon: Edie Norleen PARAS, MD;  Location: [facility];  Service: Orthopedics;  Laterality: Left;   SHOULDER ARTHROSCOPY Right [id number]   Dr. Kathi   STOMACH SURGERY  1941   3 arteries leaking in stomach after appendectomy   TONSILLECTOMY      Family History  Problem Relation Age of Onset   Pneumonia Mother    Rheum arthritis Father    Diabetes Father    Pneumonia Father    Diabetes Sister    Osteoarthritis Sister    Breast cancer Cousin     Social History   Tobacco Use   Smoking status: Never   Smokeless tobacco: Never  Substance Use Topics   Alcohol use: No    Alcohol/week: 0.0 standard drinks of alcohol     Current Outpatient Medications:    amLODipine  (NORVASC ) 2.5 MG tablet, Take 1 tablet (2.5 mg total) by mouth 2 (two) times daily., Disp: 180 tablet, Rfl: 1   aspirin 81 MG EC tablet, Take 1 tablet by mouth daily., Disp: , Rfl:    azelastine (ASTELIN) 0.1 % nasal spray, Place 1-2 sprays into both nostrils 2 (two) times daily. Use in each nostril as directed, Disp: , Rfl:    cholecalciferol (VITAMIN D3) 25 MCG (1000 UNIT) tablet, Take 1,000 Units by mouth daily., Disp: , Rfl:    Cinnamon 500 MG capsule, Take 2,000 mg by mouth daily., Disp: , Rfl:    cyanocobalamin 100 MCG tablet, Take 1,000 mcg by mouth 2 (two) times a week., Disp: , Rfl:    dicyclomine  (BENTYL ) 20 MG tablet, Take 1 tablet (20 mg total) by mouth 3 (three) times daily as needed for  spasms., Disp: 360 tablet, Rfl: 0   folic acid (FOLVITE) 1 MG tablet, Take 1 mg by mouth daily. , Disp: , Rfl:    glipiZIDE  (GLUCOTROL  XL) 5 MG 24 hr tablet, Take 1 tablet (5 mg total) by mouth every morning., Disp: 90 tablet, Rfl: 1   metFORMIN  (GLUCOPHAGE -XR) 750 MG 24 hr tablet, TAKE 2 TABLETS (1,500 MG TOTAL) DAILY WITH BREAKFAST, Disp: 180 tablet, Rfl: 1   methotrexate  (RHEUMATREX) 2.5 MG tablet, Take 20 mg by mouth once a week. Caution:Chemotherapy. Protect from light., Disp: , Rfl:    montelukast  (SINGULAIR ) 10 MG tablet, Take 1 tablet (10 mg total) by mouth at bedtime., Disp: 90 tablet, Rfl: 1   Multiple Vitamins-Minerals (MULTIVITAMIN ADULTS PO), Take 1 tablet by mouth daily. , Disp: , Rfl:    olmesartan  (BENICAR ) 40 MG tablet, Take 1 tablet (40 mg total) by mouth daily. In place of losartan, Disp: 90 tablet, Rfl: 1   potassium chloride  SA (KLOR-CON  M) 20 MEQ tablet, Take 1 tablet (20 mEq total) by mouth daily., Disp: 30  tablet, Rfl: 0   pravastatin  (PRAVACHOL ) 40 MG tablet, Take 1 tablet (40 mg total) by mouth daily., Disp: 90 tablet, Rfl: 1   predniSONE  (DELTASONE ) 5 MG tablet, Take 5 mg by mouth daily., Disp: , Rfl:    vitamin C (ASCORBIC ACID) 500 MG tablet, Take 1 tablet by mouth at bedtime. , Disp: , Rfl:    budesonide-formoterol (SYMBICORT) 160-4.5 MCG/ACT inhaler, Inhale 2 puffs into the lungs every morning. (Patient not taking: Reported on 05/12/2024), Disp: , Rfl:    fluticasone  (FLONASE ) 50 MCG/ACT nasal spray, USE 2 SPRAYS IN EACH NOSTRIL AT BEDTIME (Patient not taking: Reported on 05/12/2024), Disp: 48 g, Rfl: 3   glucose blood (ONETOUCH ULTRA) test strip, 1 each by Other route daily. Use as instructed (Patient not taking: Reported on 05/12/2024), Disp: 100 each, Rfl: 2   loratadine  (CLARITIN ) 10 MG tablet, Take 10 mg by mouth daily. (Patient not taking: Reported on 05/12/2024), Disp: , Rfl:   Allergies  Allergen Reactions   Doxycycline  Other (See Comments)    Weakness, flu  like symptoms   Codeine     Dizzy   Lantus  [Insulin Glargine]     made skin hot   Oxycodone      Dizziness   Rosuvastatin      Myalgia    Penicillin G Rash    And dizziness   Sitagliptin Rash   Sulfa Antibiotics Rash    Other reaction(s):   Red burning skin   Wound Dressing Adhesive Rash    Surgical tape adhesive    I personally reviewed active problem list, medication list, allergies with the patient/caregiver today.   ROS  Ten systems reviewed and is negative except as mentioned in HPI    Objective Physical Exam CONSTITUTIONAL: Patient appears well-developed and well-nourished. No distress. HEENT: Head atraumatic, normocephalic, neck supple. CARDIOVASCULAR: Normal rate, regular rhythm and normal heart sounds. No murmur heard. No BLE edema. PULMONARY: Effort normal and breath sounds normal. Lungs clear to auscultation bilaterally. No respiratory distress. ABDOMINAL: There is no tenderness or distention. MUSCULOSKELETAL: Normal gait. Without gross motor or sensory deficit. PSYCHIATRIC: Patient has a normal mood and affect. Behavior is normal. Judgment and thought content normal.  Vitals:   05/12/24 1351  BP: (!) 156/92  Pulse: 92  Resp: 16  SpO2: 94%  Weight: 179 lb 6.4 oz (81.4 kg)  Height: 5' 6 (1.676 m)    Body mass index is 28.96 kg/m.  Recent Results (from the past 2160 hours)  CBC and differential     Status: None   Collection Time: 02/13/24 12:00 AM  Result Value Ref Range   Hemoglobin 14.5 12.0 - 16.0   HCT 42 36 - 46   Neutrophils Absolute 9.40    Platelets 295 150 - 400 K/uL   WBC 12.8   CBC     Status: None   Collection Time: 02/13/24 12:00 AM  Result Value Ref Range   RBC 4.51 3.87 - 5.11  POCT erythrocyte sed rate, Non-automated     Status: None   Collection Time: 02/13/24 12:00 AM  Result Value Ref Range   Sed Rate 6   Basic metabolic panel with GFR     Status: Abnormal   Collection Time: 02/13/24 12:00 AM  Result Value Ref Range    Glucose 157    BUN 14 4 - 21   CO2 26 (A) 13 - 22   Creatinine 0.9 0.5 - 1.1   Potassium 3.2 (A) 3.5 - 5.1 mEq/L  Sodium 145 137 - 147   Chloride 101 99 - 108  Comprehensive metabolic panel with GFR     Status: None   Collection Time: 02/13/24 12:00 AM  Result Value Ref Range   Globulin 2.1    eGFR 63    Calcium  10.0 8.7 - 10.7   Albumin 4.5 3.5 - 5.0  Hepatic function panel     Status: None   Collection Time: 02/13/24 12:00 AM  Result Value Ref Range   Alkaline Phosphatase 85 25 - 125   ALT 17 7 - 35 U/L   AST 23 13 - 35   Bilirubin, Total 0.6   POCT glycosylated hemoglobin (Hb A1C)     Status: Abnormal   Collection Time: 05/05/24  2:32 PM  Result Value Ref Range   Hemoglobin A1C 7.2 (A) 4.0 - 5.6 %   HbA1c POC (<> result, manual entry)     HbA1c, POC (prediabetic range)     HbA1c, POC (controlled diabetic range)    Comprehensive metabolic panel with GFR     Status: Abnormal   Collection Time: 05/05/24  2:54 PM  Result Value Ref Range   Glucose, Bld 196 (H) 65 - 139 mg/dL    Comment: .        Non-fasting reference interval .    BUN 16 7 - 25 mg/dL   Creat 9.09 9.39 - 8.99 mg/dL   eGFR 65 > OR = 60 fO/fpw/8.26f7   BUN/Creatinine Ratio SEE NOTE: 6 - 22 (calc)    Comment:    Not Reported: BUN and Creatinine are within    reference range. .    Sodium 141 135 - 146 mmol/L   Potassium 2.8 (L) 3.5 - 5.3 mmol/L   Chloride 96 (L) 98 - 110 mmol/L   CO2 33 (H) 20 - 32 mmol/L   Calcium  9.4 8.6 - 10.4 mg/dL   Total Protein 6.5 6.1 - 8.1 g/dL   Albumin 4.2 3.6 - 5.1 g/dL   Globulin 2.3 1.9 - 3.7 g/dL (calc)   AG Ratio 1.8 1.0 - 2.5 (calc)   Total Bilirubin 0.8 0.2 - 1.2 mg/dL   Alkaline phosphatase (APISO) 71 37 - 153 U/L   AST 19 10 - 35 U/L   ALT 13 6 - 29 U/L  Lipid panel     Status: None   Collection Time: 05/05/24  2:54 PM  Result Value Ref Range   Cholesterol 173 <200 mg/dL   HDL 60 > OR = 50 mg/dL   Triglycerides 875 <849 mg/dL   LDL Cholesterol (Calc) 90  mg/dL (calc)    Comment: Reference range: <100 . Desirable range <100 mg/dL for primary prevention;   <70 mg/dL for patients with CHD or diabetic patients  with > or = 2 CHD risk factors. SABRA LDL-C is now calculated using the Martin-Hopkins  calculation, which is a validated novel method providing  better accuracy than the Friedewald equation in the  estimation of LDL-C.  Gladis APPLETHWAITE et al. SANDREA. 7986;689(80): 2061-2068  (http://education.[facility].com/faq/FAQ164)    Total CHOL/HDL Ratio 2.9 <5.0 (calc)   Non-HDL Cholesterol (Calc) 113 <130 mg/dL (calc)    Comment: For patients with diabetes plus 1 major ASCVD risk  factor, treating to a non-HDL-C goal of <100 mg/dL  (LDL-C of <29 mg/dL) is considered a therapeutic  option.   Microalbumin / creatinine urine ratio     Status: None   Collection Time: 05/05/24  2:54 PM  Result Value Ref Range  Creatinine, Urine 58 20 - 275 mg/dL   Microalb, Ur 1.0 mg/dL    Comment: Reference Range Not established    Microalb Creat Ratio 17 <30 mg/g creat    Comment: . The ADA defines abnormalities in albumin excretion as follows: SABRA Albuminuria Category        Result (mg/g creatinine) . Normal to Mildly increased   <30 Moderately increased         30-299  Severely increased           > OR = 300 . The ADA recommends that at least two of three specimens collected within a 3-6 month period be abnormal before considering a patient to be within a diagnostic category.   Basic metabolic panel     Status: Abnormal   Collection Time: 05/09/24  7:51 AM  Result Value Ref Range   Sodium 141 135 - 145 mmol/L   Potassium 2.6 (LL) 3.5 - 5.1 mmol/L    Comment: Critical Value, Read Back and verified with Albino Kleine at [phone] on 05/09/24 by SS   Chloride 100 98 - 111 mmol/L   CO2 29 22 - 32 mmol/L   Glucose, Bld 239 (H) 70 - 99 mg/dL    Comment: Glucose reference range applies only to samples taken after fasting for at least 8 hours.   BUN 12 8  - 23 mg/dL   Creatinine, Ser 9.25 0.44 - 1.00 mg/dL   Calcium  9.1 8.9 - 10.3 mg/dL   GFR, Estimated >39 >39 mL/min    Comment: (NOTE) Calculated using the CKD-EPI Creatinine Equation (2021)    Anion gap 11 5 - 15    Comment: Performed at [facility], [address] [place], [place] [address]  CBC     Status: Abnormal   Collection Time: 05/09/24  7:51 AM  Result Value Ref Range   WBC 11.7 (H) 4.0 - 10.5 K/uL   RBC 4.47 3.87 - 5.11 MIL/uL   Hemoglobin 13.8 12.0 - 15.0 g/dL   HCT 60.1 63.9 - 53.9 %   MCV 89.0 80.0 - 100.0 fL   MCH 30.9 26.0 - 34.0 pg   MCHC 34.7 30.0 - 36.0 g/dL   RDW 87.1 88.4 - 84.4 %   Platelets 309 150 - 400 K/uL   nRBC 0.0 0.0 - 0.2 %    Comment: Performed at [facility], [address] [place], [place] [address]  Troponin T, High Sensitivity     Status: Abnormal   Collection Time: 05/09/24  7:51 AM  Result Value Ref Range   Troponin T High Sensitivity 22 (H) 0 - 19 ng/L    Comment: (NOTE) Biotin concentrations > 1000 ng/mL falsely decrease TnT results.  Serial cardiac troponin measurements are suggested.  Refer to the Links section for chest pain algorithms and additional  guidance. Performed at [facility], [address] [place], [place] [address]   Sedimentation rate     Status: None   Collection Time: 05/09/24  7:51 AM  Result Value Ref Range   Sed Rate 5 0 - 30 mm/hr    Comment: Performed at [facility], [address] [place], [place] [address]  Hepatic function panel     Status: Abnormal   Collection Time: 05/09/24  7:51 AM  Result Value Ref Range   Total Protein 6.7 6.5 - 8.1 g/dL   Albumin 4.2 3.5 - 5.0 g/dL   AST 23 15 - 41 U/L   ALT 14 0 -  44 U/L   Alkaline Phosphatase 75 38 - 126 U/L   Total Bilirubin 1.1 0.0 - 1.2 mg/dL   Bilirubin, Direct 0.4 (H) 0.0 - 0.2 mg/dL   Indirect Bilirubin 0.7 0.3 - 0.9 mg/dL    Comment: Performed at [facility], [address], [place],  [place] [address]  Magnesium     Status: Abnormal   Collection Time: 05/09/24  7:51 AM  Result Value Ref Range   Magnesium 1.5 (L) 1.7 - 2.4 mg/dL    Comment: Performed at [facility], [address] [place], [place] [address]  CK     Status: None   Collection Time: 05/09/24  7:51 AM  Result Value Ref Range   Total CK 52 38 - 234 U/L    Comment: Performed at [facility], [address] [place], [place] [address]  Troponin T, High Sensitivity     Status: None   Collection Time: 05/09/24 10:33 AM  Result Value Ref Range   Troponin T High Sensitivity 18 0 - 19 ng/L    Comment: (NOTE) Biotin concentrations > 1000 ng/mL falsely decrease TnT results.  Serial cardiac troponin measurements are suggested.  Refer to the Links section for chest pain algorithms and additional  guidance. Performed at [facility], [address] [place], [place] [address]     PHQ2/9:    05/12/2024    1:51 PM 05/05/2024    2:18 PM 12/29/2023    3:04 PM 08/19/2023    1:47 PM 04/23/2023    2:30 PM  Depression screen PHQ 2/9  Decreased Interest 0 0 2 3 0  Down, Depressed, Hopeless 0 0 2 3 0  PHQ - 2 Score 0 0 4 6 0  Altered sleeping 0 0 0 3 0  Tired, decreased energy 0 0 0 3 0  Change in appetite 0 0 0 3 0  Feeling bad or failure about yourself  0 0 0 0 0  Trouble concentrating 0 0 3 3 0  Moving slowly or fidgety/restless 0 0 0 0 0  Suicidal thoughts 0 0 0 0 0  PHQ-9 Score 0 0 7  18  0   Difficult doing work/chores Not difficult at all Not difficult at all Very difficult Very difficult Not difficult at all     Data saved with a previous flowsheet row definition    phq 9 is negative  Fall Risk:    05/12/2024    1:51 PM 05/05/2024    2:18 PM 12/29/2023    2:53 PM 08/19/2023    1:41 PM 04/23/2023    2:22 PM  Fall Risk   Falls in the past year? 0 0 0 0 0  Number falls in past yr: 0 0 0 0 0  Injury with Fall? 0 0 0 0 0  Risk for fall due to : No Fall Risks No Fall Risks  No Fall Risks No Fall Risks No Fall Risks  Follow up Falls evaluation completed Falls evaluation completed Falls evaluation completed Falls prevention discussed;Education provided;Falls evaluation completed Falls prevention discussed      Assessment & Plan Hypokalemia and hypomagnesemia Chronic hypokalemia and hypomagnesemia, likely exacerbated by methotrexate  and prednisone . Recent severe hypokalemia required emergency IV intervention. Nephrologist recommended continued supplementation. Per nephrologist note, likely due to methotrexate  use superimposed with prednisone   - Continue potassium supplementation twice daily for three days, then reduce to once daily. - Check potassium and magnesium levels twice a month as recommended by nephrologist  Uncontrolled hypertension Hypertension remains uncontrolled with elevated readings. Current regimen includes amlodipine  and olmesartan . Nephrologist recommended beta blocker due to persistent hypertension , heart rate is elevated today  - Started nebivolol 10 mg at night in addition to current regimen. - Continue amlodipine  2.5 mg twice daily and olmesartan  40mg  qam as prescribed. - Monitor blood pressure regularly.  Psoriatic arthritis Recent exacerbation causing significant joint pain, particularly in the right arm. Methotrexate  may contribute to electrolyte disturbances. - Follow up with rheumatologist for potential medication adjustments.  Type 2 diabetes mellitus with dyslipidemia Type 2 diabetes mellitus managed with no recent hypoglycemia. - Continue current diabetes management regimen.

## 2024-05-19 ENCOUNTER — Ambulatory Visit

## 2024-05-19 DIAGNOSIS — I10 Essential (primary) hypertension: Secondary | ICD-10-CM

## 2024-05-19 NOTE — Progress Notes (Signed)
 Patient is in office today for a nurse visit for Blood Pressure Check. Patient blood pressure was 146/72, Patient No chest pain, No shortness of breath, No dyspnea on exertion, No orthopnea, No paroxysmal nocturnal dyspnea, No edema, No palpitations, No syncope   Per last note: Uncontrolled hypertension Hypertension remains uncontrolled with elevated readings. Current regimen includes amlodipine  and olmesartan . Nephrologist recommended beta blocker due to persistent hypertension , heart rate is elevated today  - Started nebivolol  10 mg at night in addition to current regimen. - Continue amlodipine  2.5 mg twice daily and olmesartan  40mg  qam as prescribed. - Monitor blood pressure regularly.  New medication Hydroxychloroquine 200mg  bid  Please advise next steps

## 2024-05-24 LAB — POTASSIUM: Potassium: 4.7 mmol/L (ref 3.5–5.3)

## 2024-05-24 LAB — MAGNESIUM: Magnesium: 2 mg/dL (ref 1.5–2.5)

## 2024-05-25 ENCOUNTER — Other Ambulatory Visit: Payer: Self-pay | Admitting: Family Medicine

## 2024-05-25 ENCOUNTER — Ambulatory Visit: Payer: Self-pay | Admitting: Family Medicine

## 2024-05-25 DIAGNOSIS — E876 Hypokalemia: Secondary | ICD-10-CM

## 2024-05-25 MED ORDER — POTASSIUM CHLORIDE CRYS ER 20 MEQ PO TBCR: 20.0000 meq | EXTENDED_RELEASE_TABLET | Freq: Two times a day (BID) | ORAL | 0 refills | Status: DC | PRN

## 2024-06-08 ENCOUNTER — Ambulatory Visit: Payer: Self-pay | Admitting: Family Medicine

## 2024-06-08 LAB — POTASSIUM: Potassium: 4.9 mmol/L (ref 3.5–5.3)

## 2024-06-09 ENCOUNTER — Other Ambulatory Visit: Payer: Self-pay | Admitting: Family Medicine

## 2024-06-09 DIAGNOSIS — E876 Hypokalemia: Secondary | ICD-10-CM

## 2024-06-15 ENCOUNTER — Ambulatory Visit

## 2024-06-21 ENCOUNTER — Encounter: Payer: Self-pay | Admitting: Family Medicine

## 2024-06-21 ENCOUNTER — Other Ambulatory Visit: Payer: Self-pay | Admitting: Family Medicine

## 2024-06-21 MED ORDER — SPIRONOLACTONE 25 MG PO TABS: 25.0000 mg | ORAL_TABLET | Freq: Every day | ORAL | 0 refills | Status: DC

## 2024-06-29 ENCOUNTER — Encounter: Payer: Self-pay | Admitting: Family Medicine

## 2024-06-29 ENCOUNTER — Ambulatory Visit: Payer: Self-pay | Admitting: Family Medicine

## 2024-06-29 ENCOUNTER — Other Ambulatory Visit: Payer: Self-pay | Admitting: Family Medicine

## 2024-06-29 DIAGNOSIS — E876 Hypokalemia: Secondary | ICD-10-CM

## 2024-06-29 LAB — POTASSIUM: Potassium: 3.8 mmol/L (ref 3.5–5.3)

## 2024-07-06 ENCOUNTER — Ambulatory Visit: Payer: Self-pay | Admitting: Family Medicine

## 2024-07-06 LAB — POTASSIUM: Potassium: 4.3 mmol/L (ref 3.5–5.3)

## 2024-07-09 ENCOUNTER — Encounter: Payer: Self-pay | Admitting: Family Medicine

## 2024-07-10 ENCOUNTER — Emergency Department
Admission: EM | Admit: 2024-07-10 | Discharge: 2024-07-10 | Disposition: A | Discharge status: Home / Self Care | Attending: Emergency Medicine | Admitting: Emergency Medicine

## 2024-07-10 ENCOUNTER — Emergency Department

## 2024-07-10 ENCOUNTER — Other Ambulatory Visit: Payer: Self-pay

## 2024-07-10 DIAGNOSIS — I10 Essential (primary) hypertension: Secondary | ICD-10-CM | POA: Insufficient documentation

## 2024-07-10 DIAGNOSIS — R42 Dizziness and giddiness: Secondary | ICD-10-CM | POA: Insufficient documentation

## 2024-07-10 DIAGNOSIS — H539 Unspecified visual disturbance: Secondary | ICD-10-CM

## 2024-07-10 DIAGNOSIS — H538 Other visual disturbances: Secondary | ICD-10-CM | POA: Diagnosis not present

## 2024-07-10 DIAGNOSIS — R350 Frequency of micturition: Secondary | ICD-10-CM | POA: Insufficient documentation

## 2024-07-10 LAB — URINALYSIS, ROUTINE W REFLEX MICROSCOPIC
Bilirubin Urine: NEGATIVE
Glucose, UA: NEGATIVE mg/dL
Hgb urine dipstick: NEGATIVE
Ketones, ur: NEGATIVE mg/dL
Nitrite: NEGATIVE
Protein, ur: NEGATIVE mg/dL
Specific Gravity, Urine: 1.004 — ABNORMAL LOW (ref 1.005–1.030)
pH: 7 (ref 5.0–8.0)

## 2024-07-10 LAB — COMPREHENSIVE METABOLIC PANEL WITH GFR
ALT: 12 U/L (ref 0–44)
AST: 20 U/L (ref 15–41)
Albumin: 4 g/dL (ref 3.5–5.0)
Alkaline Phosphatase: 82 U/L (ref 38–126)
Anion gap: 10 (ref 5–15)
BUN: 20 mg/dL (ref 8–23)
CO2: 24 mmol/L (ref 22–32)
Calcium: 9.6 mg/dL (ref 8.9–10.3)
Chloride: 107 mmol/L (ref 98–111)
Creatinine, Ser: 0.9 mg/dL (ref 0.44–1.00)
GFR, Estimated: >60 mL/min
Glucose, Bld: 130 mg/dL — ABNORMAL HIGH (ref 70–99)
Potassium: 3.8 mmol/L (ref 3.5–5.1)
Sodium: 140 mmol/L (ref 135–145)
Total Bilirubin: 0.4 mg/dL (ref 0.0–1.2)
Total Protein: 6.5 g/dL (ref 6.5–8.1)

## 2024-07-10 LAB — CBC WITH DIFFERENTIAL/PLATELET
Abs Immature Granulocytes: 0.02 K/uL (ref 0.00–0.07)
Basophils Absolute: 0.1 K/uL (ref 0.0–0.1)
Basophils Relative: 1 %
Eosinophils Absolute: 0.4 K/uL (ref 0.0–0.5)
Eosinophils Relative: 5 %
HCT: 36.3 % (ref 36.0–46.0)
Hemoglobin: 12.5 g/dL (ref 12.0–15.0)
Immature Granulocytes: 0 %
Lymphocytes Relative: 31 %
Lymphs Abs: 2.8 K/uL (ref 0.7–4.0)
MCH: 31.7 pg (ref 26.0–34.0)
MCHC: 34.4 g/dL (ref 30.0–36.0)
MCV: 92.1 fL (ref 80.0–100.0)
Monocytes Absolute: 0.6 K/uL (ref 0.1–1.0)
Monocytes Relative: 7 %
Neutro Abs: 4.9 K/uL (ref 1.7–7.7)
Neutrophils Relative %: 56 %
Platelets: 286 K/uL (ref 150–400)
RBC: 3.94 MIL/uL (ref 3.87–5.11)
RDW: 13.4 % (ref 11.5–15.5)
WBC: 8.8 K/uL (ref 4.0–10.5)
nRBC: 0 % (ref 0.0–0.2)

## 2024-07-10 LAB — TROPONIN T, HIGH SENSITIVITY
Troponin T High Sensitivity: <15 ng/L (ref 0–19)
Troponin T High Sensitivity: <15 ng/L (ref 0–19)

## 2024-07-10 LAB — MAGNESIUM: Magnesium: 1.8 mg/dL (ref 1.7–2.4)

## 2024-07-10 MED ORDER — CEFUROXIME AXETIL 250 MG PO TABS: 250.0000 mg | ORAL_TABLET | Freq: Two times a day (BID) | ORAL | 0 refills | Status: AC

## 2024-07-10 MED ORDER — ACETAMINOPHEN 500 MG PO TABS
1000.0000 mg | ORAL_TABLET | Freq: Once | ORAL | Status: AC
  Administered 2024-07-10: 1000 mg via ORAL
  Filled 2024-07-10: qty 2

## 2024-07-10 MED ORDER — LACTATED RINGERS IV BOLUS
1000.0000 mL | Freq: Once | INTRAVENOUS | Status: AC
  Administered 2024-07-10: 1000 mL via INTRAVENOUS

## 2024-07-10 MED ORDER — CEFUROXIME AXETIL 250 MG PO TABS
250.0000 mg | ORAL_TABLET | Freq: Once | ORAL | Status: AC
  Administered 2024-07-10: 250 mg via ORAL
  Filled 2024-07-10: qty 1

## 2024-07-10 NOTE — ED Triage Notes (Signed)
 States woke up this morning and was seeing buildings and such  behind eyes and then felt dizzy when getting out of bed.  Recently in the hospital (November) for electrolyte imbalance.   AAOx3. Skin warm and dry. MAE equally and strong. Speech clear. NAD

## 2024-07-10 NOTE — ED Notes (Signed)
 Patient transported to CT

## 2024-07-10 NOTE — ED Notes (Addendum)
 Snack given with med see [facility]

## 2024-07-10 NOTE — Discharge Instructions (Addendum)
 You were seen in the emergency department for a visual disturbance.  You had some signs of a urinary tract infection.  You had a CT scan of your head and an MRI of your brain that did not show any new abnormalities.  Your urine was sent for culture and you were started on a urinary tract antibiotic.  Your lab work is overall reassuring.  Follow-up closely with your primary care physician.  Return to the emergency department for any new or worsening symptoms.

## 2024-07-10 NOTE — ED Notes (Signed)
"  Pt transported to MRI at this time.   "

## 2024-07-10 NOTE — ED Provider Notes (Signed)
 "  [facility] Provider Note    Event Date/Time   First MD Initiated Contact with Patient 07/10/24 [phone]     (approximate)   History   Dizziness   HPI  Britta Louth is a 79 y.o. female who presents to the ED for evaluation of Dizziness   Reviewed PCP visit from November.  History of hypertension, chronic hypokalemia.  On methotrexate  and prednisone  chronically for psoriatic arthritis.  Diabetes.  Patient presents to the ED alongside her husband for evaluation of an episode of orthostasis this morning, developing headache and visual disturbance that she notices only when closing her eyes.  Brief episode of lightheadedness while standing up this morning that self resolved and has not recurred.  No dizziness now.  Since getting here to the ER she developed send mild aching occipital headache without other areas of pain, no falls or syncope.  Reports primary concern is that she sees rectangular shapes, buildings, cabinets in her visual fields only when she closes her eyes.  She reports no changes to her vision while awake, eyes are open and looking around.  She reports intact acuity and she does not wear glasses or contacts.  No other sensation changes, motor changes, gait changes, speech changes.   Reports poor sleep last night, the TV was on all night and she was watching the news.  Reports these visual disturbances earlier this morning looked like burnt out buildings like in [place].  Physical Exam   Triage Vital Signs: ED Triage Vitals  Encounter Vitals Group     BP 07/10/24 0815 (!) 169/109     Girls Systolic BP Percentile --      Girls Diastolic BP Percentile --      Boys Systolic BP Percentile --      Boys Diastolic BP Percentile --      Pulse Rate 07/10/24 0815 72     Resp 07/10/24 0815 16     Temp 07/10/24 0815 98.6 F (37 C)     Temp Source 07/10/24 0815 Oral     SpO2 07/10/24 0815 98 %     Weight 07/10/24 0814 179 lb 7.3 oz (81.4 kg)      Height --      Head Circumference --      Peak Flow --      Pain Score 07/10/24 0814 0     Pain Loc --      Pain Education --      Exclude from Growth Chart --     Most recent vital signs: Vitals:   07/10/24 1130 07/10/24 1137  BP: (!) 134/117   Pulse: 62 60  Resp: 18 17  Temp:    SpO2: 94% 96%    General: Awake, no distress.  Slightly hard of hearing, otherwise  pleasant and conversational. CV:  Good peripheral perfusion.  Resp:  Normal effort.  Abd:  No distention.  Some mild suprapubic tenderness, otherwise nontender MSK:  No deformity noted.  Neuro:  No focal deficits appreciated. Cranial nerves II through XII intact 5/5 strength and sensation in all 4 extremities Other:     ED Results / Procedures / Treatments   Labs (all labs ordered are listed, but only abnormal results are displayed) Labs Reviewed  COMPREHENSIVE METABOLIC PANEL WITH GFR - Abnormal; Notable for the following components:      Result Value   Glucose, Bld 130 (*)    All other components within normal limits  URINALYSIS, ROUTINE W REFLEX MICROSCOPIC -  Abnormal; Notable for the following components:   Color, Urine COLORLESS (*)    APPearance CLEAR (*)    Specific Gravity, Urine 1.004 (*)    Leukocytes,Ua TRACE (*)    Bacteria, UA RARE (*)    All other components within normal limits  URINE CULTURE  CBC WITH DIFFERENTIAL/PLATELET  MAGNESIUM   TROPONIN T, HIGH SENSITIVITY  TROPONIN T, HIGH SENSITIVITY    EKG Sinus rhythm with a rate of 88 bpm, treatment is baseline clouds fine details.  Seems to demonstrate normal axis, QTc 491, no clear signs of acute ischemia.  RADIOLOGY CT head interpreted by me without evidence of acute intracranial pathology  Official radiology report(s): CT HEAD WO CONTRAST ( ) Result Date: 07/10/2024 CLINICAL DATA:  Acute vision changes. EXAM: CT HEAD WITHOUT CONTRAST TECHNIQUE: Contiguous axial images were obtained from the base of the skull through the vertex  without intravenous contrast. RADIATION DOSE REDUCTION: This exam was performed according to the departmental dose-optimization program which includes automated exposure control, adjustment of the mA and/or kV according to patient size and/or use of iterative reconstruction technique. COMPARISON:  None Available. FINDINGS: Brain: There is no evidence for acute hemorrhage, hydrocephalus, mass lesion, or abnormal extra-axial fluid collection. No definite CT evidence for acute infarction. Diffuse loss of parenchymal volume is consistent with atrophy. Patchy low attenuation in the deep hemispheric and periventricular white matter is nonspecific, but likely reflects chronic microvascular ischemic demyelination. Vascular: No hyperdense vessel or unexpected calcification. Skull: No evidence for fracture. No worrisome lytic or sclerotic lesion. Sinuses/Orbits: The visualized paranasal sinuses and mastoid air cells are clear. Visualized portions of the globes and intraorbital fat are unremarkable. Other: None. IMPRESSION: 1. No acute intracranial abnormality. 2. Atrophy with chronic small vessel ischemic disease. Electronically Signed   By: Camellia Candle M.D.   On: 07/10/2024 09:26    PROCEDURES and INTERVENTIONS:  .1-3 Lead EKG Interpretation  Performed by: Claudene Rover, MD Authorized by: Claudene Rover, MD     Interpretation: normal     ECG rate:  78   ECG rate assessment: normal     Rhythm: sinus rhythm     Ectopy: none     Conduction: normal     Medications  lactated ringers  bolus 1,000 mL (0 mLs Intravenous Stopped 07/10/24 0924)  acetaminophen  (TYLENOL ) tablet 1,000 mg (1,000 mg Oral Given 07/10/24 0924)  cefUROXime  (CEFTIN ) tablet 250 mg (250 mg Oral Given 07/10/24 1141)     IMPRESSION / MDM / ASSESSMENT AND PLAN / ED COURSE  I reviewed the triage vital signs and the nursing notes.  Differential diagnosis includes, but is not limited to, ischemic stroke, medication side effect, metabolic  encephalopathy, electrolyte derangement, acute stress reaction  {Patient presents with symptoms of an acute illness or injury that is potentially life-threatening.  Patient presents to the ED with nonspecific visual disturbance.  Otherwise neurologically intact, without reproducible deficits on my exam.  She has a normal CBC, metabolic panel including her potassium, troponin.  UA sent for culture but she is reporting urinary frequency and has suprapubic tenderness and cystitis is a possibility, start on cefuroxime  pending urine culture results.  Awaiting MRI brain to further assess for CVA at the time of signout to oncoming physician.  If this is reassuring I suspect patient would be suitable for outpatient management with ophthalmologic follow-up.  Clinical Course as of 07/10/24 1358  Sat Jul 10, 2024  1057 Reassessed.  Patient reports headache is resolved, still has visual disturbance described earlier.  We  discussed workup thus far.  We discussed urinalysis without clear cystitis but she has had suprapubic discomfort, she also brings up that she has had significant urinary frequency this morning that she failed to describe earlier.  Discussed the possibility of cystitis contributing to her symptoms, send her urine for culture and start antibiotics.  Will obtain MRI brain to assess for CVA. [DS]    Clinical Course User Index [DS] Claudene Rover, MD     FINAL CLINICAL IMPRESSION(S) / ED DIAGNOSES   Final diagnoses:  Urinary frequency  Visual disturbance     Rx / DC Orders   ED Discharge Orders          Ordered    cefUROXime  (CEFTIN ) 250 MG tablet  2 times daily with meals        07/10/24 1353             Note:  This document was prepared using Dragon voice recognition software and may include unintentional dictation errors.   Claudene Rover, MD 07/10/24 1401  "

## 2024-07-10 NOTE — ED Notes (Addendum)
 Pt reports that she has trouble w/ her memory, generalized weakness/unsteadiness- particularly with walking, has pain in the back of her head, has had a vibrating sensation in feet when walking, and this morning saw pictures when I closed my eyes. Pt is AOX4, NAD noted. Pt denies any recent falls, injuries, reports changes to medications.

## 2024-07-10 NOTE — ED Notes (Signed)
 Pt ambulatory to waiting room. Pt verbalized understanding of discharge instructions.

## 2024-07-10 NOTE — ED Provider Notes (Signed)
 Care assumed of patient from outgoing provider.  See their note for initial history, exam and plan.  Clinical Course as of 07/10/24 1805  Sat Jul 10, 2024  1057 Reassessed.  Patient reports headache is resolved, still has visual disturbance described earlier.  We discussed workup thus far.  We discussed urinalysis without clear cystitis but she has had suprapubic discomfort, she also brings up that she has had significant urinary frequency this morning that she failed to describe earlier.  Discussed the possibility of cystitis contributing to her symptoms, send her urine for culture and start antibiotics.  Will obtain MRI brain to assess for CVA. [DS]  1508 MRI brain to r/o CVA.  UTI? Now back to baseline with normal neuro exam. Closing eyes with visions.  Plan to follow up with optho if MR negative.  [SM]  1801 MRI of the brain with no acute findings.  Patient prescribed treatment for urinary tract infection.  On reevaluation patient patient states she still feeling well.  Patient was already called in an antibiotic for possible urinary tract infection.  Discussed follow-up closely with primary care doctor.  Urine was sent for culture.  Discussed return for any ongoing or worsening symptoms.  No questions or concerns at time of discharge. [SM]    Clinical Course User Index [DS] Claudene Rover, MD [SM] Suzanne Kirsch, MD     Suzanne Kirsch, MD 07/10/24 [phone]

## 2024-07-12 ENCOUNTER — Encounter: Payer: Self-pay | Admitting: Family Medicine

## 2024-07-12 LAB — URINE CULTURE: Culture: NO GROWTH

## 2024-07-14 ENCOUNTER — Other Ambulatory Visit: Payer: Self-pay | Admitting: Family Medicine

## 2024-07-14 NOTE — Telephone Encounter (Signed)
 Requested Prescriptions  Pending Prescriptions Disp Refills   spironolactone  (ALDACTONE ) 25 MG tablet [Pharmacy Med Name: SPIRONOLACTONE  25 MG TABLET] 90 tablet 0    Sig: TAKE 1 TABLET (25 MG TOTAL) BY MOUTH DAILY. STOP POTASSIUM     Cardiovascular: Diuretics - Aldosterone Antagonist Passed - 07/14/2024  4:14 PM      Passed - Cr in normal range and within 180 days    Creat  Date Value Ref Range Status  05/05/2024 0.90 0.60 - 1.00 mg/dL Final   Creatinine, Ser  Date Value Ref Range Status  07/10/2024 0.90 0.44 - 1.00 mg/dL Final   Creatinine, Urine  Date Value Ref Range Status  05/05/2024 58 20 - 275 mg/dL Final         Passed - K in normal range and within 180 days    Potassium  Date Value Ref Range Status  07/10/2024 3.8 3.5 - 5.1 mmol/L Final  12/31/2011 3.5 3.5 - 5.1 mmol/L Final         Passed - Na in normal range and within 180 days    Sodium  Date Value Ref Range Status  07/10/2024 140 135 - 145 mmol/L Final  02/13/2024 145 137 - 147 Final  12/31/2011 139 136 - 145 mmol/L Final         Passed - eGFR is 30 or above and within 180 days    GFR, Est African American  Date Value Ref Range Status  05/31/2020 59 (L) > OR = 60 mL/min/1.58m2 Final   GFR, Est Non African American  Date Value Ref Range Status  05/31/2020 51 (L) > OR = 60 mL/min/1.82m2 Final   GFR, Estimated  Date Value Ref Range Status  07/10/2024 >60 >60 mL/min Final    Comment:    (NOTE) Calculated using the CKD-EPI Creatinine Equation (2021)    eGFR  Date Value Ref Range Status  05/05/2024 65 > OR = 60 mL/min/1.72m2 Final         Passed - Last BP in normal range    BP Readings from Last 1 Encounters:  07/10/24 132/63         Passed - Valid encounter within last 6 months    Recent Outpatient Visits           2 months ago Uncontrolled hypertension   [facility] Glenard Mire, MD   2 months ago Dyslipidemia associated with type 2 diabetes mellitus [facility])    [facility] Glenard Mire, MD   6 months ago Dyslipidemia associated with type 2 diabetes mellitus [facility])   [facility] Sowles, Krichna, MD   11 months ago Type 2 diabetes mellitus with obesity   [facility] Sowles, Krichna, MD

## 2024-07-16 ENCOUNTER — Encounter: Payer: Self-pay | Admitting: Family Medicine

## 2024-07-16 ENCOUNTER — Ambulatory Visit: Admitting: Family Medicine

## 2024-07-16 VITALS — BP 126/74 | HR 76 | Resp 16 | Ht 66.0 in | Wt 183.4 lb

## 2024-07-16 DIAGNOSIS — E785 Hyperlipidemia, unspecified: Secondary | ICD-10-CM | POA: Diagnosis not present

## 2024-07-16 DIAGNOSIS — E1169 Type 2 diabetes mellitus with other specified complication: Secondary | ICD-10-CM

## 2024-07-16 DIAGNOSIS — Z7984 Long term (current) use of oral hypoglycemic drugs: Secondary | ICD-10-CM

## 2024-07-16 DIAGNOSIS — R441 Visual hallucinations: Secondary | ICD-10-CM | POA: Diagnosis not present

## 2024-07-16 DIAGNOSIS — F09 Unspecified mental disorder due to known physiological condition: Secondary | ICD-10-CM | POA: Diagnosis not present

## 2024-07-16 DIAGNOSIS — Z974 Presence of external hearing-aid: Secondary | ICD-10-CM

## 2024-07-16 DIAGNOSIS — F331 Major depressive disorder, recurrent, moderate: Secondary | ICD-10-CM

## 2024-07-16 MED ORDER — METFORMIN HCL ER 750 MG PO TB24: ORAL_TABLET | ORAL | 1 refills | Status: AC

## 2024-07-16 NOTE — Progress Notes (Signed)
 Name: Mary Cantrell   MRN: [id number]    DOB: 1945-10-02   Date:07/16/2024       Progress Note  Subjective  Chief Complaint  Chief Complaint  Patient presents with   Hallucinations   Discussed the use of AI scribe software for clinical note transcription with the patient, who gave verbal consent to proceed.  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 She is accompanied by her husband  She experienced acute visual hallucinations on Saturday morning around 7:00 AM, seeing various scenes upon opening and closing her eyes. This episode lasted only for that day and resolved by the evening. She was taken to the hospital where a CT scan and MRI of the head were performed, both of which showed no acute abnormalities. Blood tests, including troponin, magnesium , potassium, and glucose, were normal. A urinalysis showed a small amount of bacteria and leukocytes and initially she was sent home with the possible diagnosis of UTI that could have caused the hallucination , however  the urine culture was subsequently was found to be negative.  During the episode, she also experienced weakness and had to touch the wall to maintain balance, but this symptom resolved by the end of the day. No chest pain, palpitations, nausea, vomiting, diarrhea, or constipation.  She has a history of depression and anxiety, and feels 'always a little down.' She has been sleeping more over the past six months, often staying in bed until 10:00 or 10:30 AM, whereas she used to wake up by 9:00 AM. She denies any significant changes in appetite or feelings of worthlessness, but reports a lack of interest in activities and feeling tired. She is not currently taking any medication for depression.  Her husband reports cognitive difficulties, such as forgetting instructions shortly after she is given and struggling with new  technology. He notes that she was previously adaptable and quick to learn. She has a family history of memory problems, as her mother had similar issues in her eighties.    Patient Active Problem List   Diagnosis Date Noted   Allergic sinusitis 04/23/2023   Moderate episode of recurrent major depressive disorder (HCC) 04/23/2023   Mild intermittent asthma without complication 07/24/2022   Hypertension, benign 08/22/2021   Long term (current) use of systemic steroids 08/22/2021   Lumbar spondylosis 08/22/2021   Osteoarthritis 08/22/2021   DDD (degenerative disc disease), lumbar 10/24/2020   Neuropathy 02/27/2020   Sensory ataxia 02/27/2020   Tremor 02/27/2020   Dyslipidemia associated with type 2 diabetes mellitus (HCC) 12/09/2018   Obesity (BMI 30.0-34.9) 07/17/2018   Psoriatic arthritis (HCC) 12/04/2017   Stenosis of left carotid artery 12/16/2016   Polymyalgia rheumatica 04/13/2015   Back pain, chronic 12/21/2014   Narrowing of intervertebral disc space 12/21/2014   Diabetic gastroparesis (HCC) 12/21/2014   Amaurosis fugax of left eye 12/21/2014   H/O: hypothyroidism 12/21/2014   Hyperlipidemia 12/21/2014   IBS (irritable bowel syndrome) 12/21/2014   Disorder of macula of retina 12/21/2014   Asthma, moderate 12/21/2014   NASH (nonalcoholic steatohepatitis) 12/21/2014   NS (nuclear sclerosis) 12/21/2014   Osteoarthrosis 12/21/2014   Psoriasis 12/21/2014   Knee torn cartilage 12/21/2014   OSA on CPAP 01/03/2014   History of shoulder surgery 04/23/2011    Past Surgical History:  Procedure Laterality Date   ABDOMINAL HYSTERECTOMY     APPENDECTOMY     ARTERY BIOPSY N/A 02/23/2015  Procedure: BIOPSY TEMPORAL ARTERY;  Surgeon: Selinda GORMAN Gu, MD;  Location: [facility];  Service: Vascular;  Laterality: N/A;   BREAST BIOPSY Left    1990's   CATARACT EXTRACTION W/PHACO Left 07/24/2021   Procedure: CATARACT EXTRACTION PHACO AND INTRAOCULAR LENS PLACEMENT (IOC) LEFT DIABETIC 8.37  00:50.0;  Surgeon: Jaye Fallow, MD;  Location: [facility];  Service: Ophthalmology;  Laterality: Left;  Diabetic   CATARACT EXTRACTION W/PHACO Right 08/07/2021   Procedure: CATARACT EXTRACTION PHACO AND INTRAOCULAR LENS PLACEMENT (IOC) RIGHT DIABETIC 7.29 00:48.4;  Surgeon: Jaye Fallow, MD;  Location: [facility];  Service: Ophthalmology;  Laterality: Right;  Diabetic   EXCISION / CURETTAGE BONE CYST FINGER     Left Index Finger   PILONIDAL CYST EXCISION     REVERSE SHOULDER ARTHROPLASTY Left 08/27/2022   Procedure: REVERSE SHOULDER ARTHROPLASTY WITH BICEPS TENODESIS.- RNFA;  Surgeon: Edie Norleen PARAS, MD;  Location: [facility];  Service: Orthopedics;  Laterality: Left;   SHOULDER ARTHROSCOPY Right [id number]   Dr. Kathi   STOMACH SURGERY  1941   3 arteries leaking in stomach after appendectomy   TONSILLECTOMY      Family History  Problem Relation Age of Onset   Pneumonia Mother    Rheum arthritis Father    Diabetes Father    Pneumonia Father    Diabetes Sister    Osteoarthritis Sister    Breast cancer Cousin     Social History   Tobacco Use   Smoking status: Never   Smokeless tobacco: Never  Substance Use Topics   Alcohol use: No    Alcohol/week: 0.0 standard drinks of alcohol    Current Medications[1]  Allergies[2]  I personally reviewed active problem list, medication list, allergies, family history with the patient/caregiver today.   ROS  Ten systems reviewed and is negative except as mentioned in HPI    Objective Physical Exam CONSTITUTIONAL: Patient appears well-developed and well-nourished. No distress. Normal appearance. HEENT: Head atraumatic, normocephalic, neck supple. CARDIOVASCULAR: Normal rate, regular rhythm and normal heart sounds. No murmur heard. No BLE edema. PULMONARY: Effort normal and breath sounds normal. No respiratory distress. Neuro: no focal deficit  PSYCHIATRIC: Patient has a normal mood and affect.  cooperative  Vitals:   07/16/24 1518  BP: 126/74  Pulse: 76  Resp: 16  SpO2: 96%  Weight: 183 lb 6.4 oz (83.2 kg)  Height: 5' 6 (1.676 m)    Body mass index is 29.6 kg/m.  Recent Results (from the past 2160 hours)  POCT glycosylated hemoglobin (Hb A1C)     Status: Abnormal   Collection Time: 05/05/24  2:32 PM  Result Value Ref Range   Hemoglobin A1C 7.2 (A) 4.0 - 5.6 %   HbA1c POC (<> result, manual entry)     HbA1c, POC (prediabetic range)     HbA1c, POC (controlled diabetic range)    Comprehensive metabolic panel with GFR     Status: Abnormal   Collection Time: 05/05/24  2:54 PM  Result Value Ref Range   Glucose, Bld 196 (H) 65 - 139 mg/dL    Comment: .        Non-fasting reference interval .    BUN 16 7 - 25 mg/dL   Creat 9.09 9.39 - 8.99 mg/dL   eGFR 65 > OR = 60 fO/fpw/8.26f7   BUN/Creatinine Ratio SEE NOTE: 6 - 22 (calc)    Comment:    Not Reported: BUN and Creatinine are within    reference range. SABRA  Sodium 141 135 - 146 mmol/L   Potassium 2.8 (L) 3.5 - 5.3 mmol/L   Chloride 96 (L) 98 - 110 mmol/L   CO2 33 (H) 20 - 32 mmol/L   Calcium  9.4 8.6 - 10.4 mg/dL   Total Protein 6.5 6.1 - 8.1 g/dL   Albumin 4.2 3.6 - 5.1 g/dL   Globulin 2.3 1.9 - 3.7 g/dL (calc)   AG Ratio 1.8 1.0 - 2.5 (calc)   Total Bilirubin 0.8 0.2 - 1.2 mg/dL   Alkaline phosphatase (APISO) 71 37 - 153 U/L   AST 19 10 - 35 U/L   ALT 13 6 - 29 U/L  Lipid panel     Status: None   Collection Time: 05/05/24  2:54 PM  Result Value Ref Range   Cholesterol 173 <200 mg/dL   HDL 60 > OR = 50 mg/dL   Triglycerides 875 <849 mg/dL   LDL Cholesterol (Calc) 90 mg/dL (calc)    Comment: Reference range: <100 . Desirable range <100 mg/dL for primary prevention;   <70 mg/dL for patients with CHD or diabetic patients  with > or = 2 CHD risk factors. SABRA LDL-C is now calculated using the Martin-Hopkins  calculation, which is a validated novel method providing  better accuracy than the Friedewald  equation in the  estimation of LDL-C.  Gladis APPLETHWAITE et al. SANDREA. 7986;689(80): 2061-2068  (http://education.[facility].com/faq/FAQ164)    Total CHOL/HDL Ratio 2.9 <5.0 (calc)   Non-HDL Cholesterol (Calc) 113 <130 mg/dL (calc)    Comment: For patients with diabetes plus 1 major ASCVD risk  factor, treating to a non-HDL-C goal of <100 mg/dL  (LDL-C of <29 mg/dL) is considered a therapeutic  option.   Microalbumin / creatinine urine ratio     Status: None   Collection Time: 05/05/24  2:54 PM  Result Value Ref Range   Creatinine, Urine 58 20 - 275 mg/dL   Microalb, Ur 1.0 mg/dL    Comment: Reference Range Not established    Microalb Creat Ratio 17 <30 mg/g creat    Comment: . The ADA defines abnormalities in albumin excretion as follows: SABRA Albuminuria Category        Result (mg/g creatinine) . Normal to Mildly increased   <30 Moderately increased         30-299  Severely increased           > OR = 300 . The ADA recommends that at least two of three specimens collected within a 3-6 month period be abnormal before considering a patient to be within a diagnostic category.   Basic metabolic panel     Status: Abnormal   Collection Time: 05/09/24  7:51 AM  Result Value Ref Range   Sodium 141 135 - 145 mmol/L   Potassium 2.6 (LL) 3.5 - 5.1 mmol/L    Comment: Critical Value, Read Back and verified with Albino Kleine at [phone] on 05/09/24 by SS   Chloride 100 98 - 111 mmol/L   CO2 29 22 - 32 mmol/L   Glucose, Bld 239 (H) 70 - 99 mg/dL    Comment: Glucose reference range applies only to samples taken after fasting for at least 8 hours.   BUN 12 8 - 23 mg/dL   Creatinine, Ser 9.25 0.44 - 1.00 mg/dL   Calcium  9.1 8.9 - 10.3 mg/dL   GFR, Estimated >39 >39 mL/min    Comment: (NOTE) Calculated using the CKD-EPI Creatinine Equation (2021)    Anion gap 11 5 - 15  Comment: Performed at [facility], [address] [place], [place] [address]  CBC     Status: Abnormal    Collection Time: 05/09/24  7:51 AM  Result Value Ref Range   WBC 11.7 (H) 4.0 - 10.5 K/uL   RBC 4.47 3.87 - 5.11 MIL/uL   Hemoglobin 13.8 12.0 - 15.0 g/dL   HCT 60.1 63.9 - 53.9 %   MCV 89.0 80.0 - 100.0 fL   MCH 30.9 26.0 - 34.0 pg   MCHC 34.7 30.0 - 36.0 g/dL   RDW 87.1 88.4 - 84.4 %   Platelets 309 150 - 400 K/uL   nRBC 0.0 0.0 - 0.2 %    Comment: Performed at [facility], [address] [place], [place] [address]  Troponin T, High Sensitivity     Status: Abnormal   Collection Time: 05/09/24  7:51 AM  Result Value Ref Range   Troponin T High Sensitivity 22 (H) 0 - 19 ng/L    Comment: (NOTE) Biotin concentrations > 1000 ng/mL falsely decrease TnT results.  Serial cardiac troponin measurements are suggested.  Refer to the Links section for chest pain algorithms and additional  guidance. Performed at [facility], [address] [place], [place] [address]   Sedimentation rate     Status: None   Collection Time: 05/09/24  7:51 AM  Result Value Ref Range   Sed Rate 5 0 - 30 mm/hr    Comment: Performed at [facility], [address] [place], [place] [address]  Hepatic function panel     Status: Abnormal   Collection Time: 05/09/24  7:51 AM  Result Value Ref Range   Total Protein 6.7 6.5 - 8.1 g/dL   Albumin 4.2 3.5 - 5.0 g/dL   AST 23 15 - 41 U/L   ALT 14 0 - 44 U/L   Alkaline Phosphatase 75 38 - 126 U/L   Total Bilirubin 1.1 0.0 - 1.2 mg/dL   Bilirubin, Direct 0.4 (H) 0.0 - 0.2 mg/dL   Indirect Bilirubin 0.7 0.3 - 0.9 mg/dL    Comment: Performed at [facility], [address] [place], [place] [address]  Magnesium      Status: Abnormal   Collection Time: 05/09/24  7:51 AM  Result Value Ref Range   Magnesium  1.5 (L) 1.7 - 2.4 mg/dL    Comment: Performed at [facility], [address] [place], [place] [address]  CK     Status: None   Collection Time: 05/09/24  7:51 AM  Result Value Ref Range   Total CK 52 38 - 234  U/L    Comment: Performed at [facility], [address] [place], [place] [address]  Troponin T, High Sensitivity     Status: None   Collection Time: 05/09/24 10:33 AM  Result Value Ref Range   Troponin T High Sensitivity 18 0 - 19 ng/L    Comment: (NOTE) Biotin concentrations > 1000 ng/mL falsely decrease TnT results.  Serial cardiac troponin measurements are suggested.  Refer to the Links section for chest pain algorithms and additional  guidance. Performed at [facility], [address] [place], [place] [address]   Magnesium      Status: None   Collection Time: 05/24/24  1:48 PM  Result Value Ref Range   Magnesium  2.0 1.5 - 2.5 mg/dL  Potassium     Status: None   Collection Time: 05/24/24  1:48 PM  Result Value Ref Range   Potassium  4.7 3.5 - 5.3 mmol/L  Potassium     Status: None   Collection Time: 06/07/24  2:25 PM  Result Value Ref Range   Potassium 4.9 3.5 - 5.3 mmol/L  Potassium     Status: None   Collection Time: 06/28/24  2:16 PM  Result Value Ref Range   Potassium 3.8 3.5 - 5.3 mmol/L  Potassium     Status: None   Collection Time: 07/05/24  2:04 PM  Result Value Ref Range   Potassium 4.3 3.5 - 5.3 mmol/L  Comprehensive metabolic panel with GFR     Status: Abnormal   Collection Time: 07/10/24  8:34 AM  Result Value Ref Range   Sodium 140 135 - 145 mmol/L   Potassium 3.8 3.5 - 5.1 mmol/L   Chloride 107 98 - 111 mmol/L   CO2 24 22 - 32 mmol/L   Glucose, Bld 130 (H) 70 - 99 mg/dL    Comment: Glucose reference range applies only to samples taken after fasting for at least 8 hours.   BUN 20 8 - 23 mg/dL   Creatinine, Ser 9.09 0.44 - 1.00 mg/dL   Calcium  9.6 8.9 - 10.3 mg/dL   Total Protein 6.5 6.5 - 8.1 g/dL   Albumin 4.0 3.5 - 5.0 g/dL   AST 20 15 - 41 U/L   ALT 12 0 - 44 U/L   Alkaline Phosphatase 82 38 - 126 U/L   Total Bilirubin 0.4 0.0 - 1.2 mg/dL   GFR, Estimated >39 >39 mL/min    Comment: (NOTE) Calculated using the CKD-EPI  Creatinine Equation (2021)    Anion gap 10 5 - 15    Comment: Performed at [facility], [address] [place], [place] [address]  CBC with Differential/Platelet     Status: None   Collection Time: 07/10/24  8:34 AM  Result Value Ref Range   WBC 8.8 4.0 - 10.5 K/uL   RBC 3.94 3.87 - 5.11 MIL/uL   Hemoglobin 12.5 12.0 - 15.0 g/dL   HCT 63.6 63.9 - 53.9 %   MCV 92.1 80.0 - 100.0 fL   MCH 31.7 26.0 - 34.0 pg   MCHC 34.4 30.0 - 36.0 g/dL   RDW 86.5 88.4 - 84.4 %   Platelets 286 150 - 400 K/uL   nRBC 0.0 0.0 - 0.2 %   Neutrophils Relative % 56 %   Neutro Abs 4.9 1.7 - 7.7 K/uL   Lymphocytes Relative 31 %   Lymphs Abs 2.8 0.7 - 4.0 K/uL   Monocytes Relative 7 %   Monocytes Absolute 0.6 0.1 - 1.0 K/uL   Eosinophils Relative 5 %   Eosinophils Absolute 0.4 0.0 - 0.5 K/uL   Basophils Relative 1 %   Basophils Absolute 0.1 0.0 - 0.1 K/uL   Immature Granulocytes 0 %   Abs Immature Granulocytes 0.02 0.00 - 0.07 K/uL    Comment: Performed at [facility], [address] [place], [place] [address]  Magnesium      Status: None   Collection Time: 07/10/24  8:34 AM  Result Value Ref Range   Magnesium  1.8 1.7 - 2.4 mg/dL    Comment: Performed at [facility], [address] [place], [place] [address]  Troponin T, High Sensitivity     Status: None   Collection Time: 07/10/24  8:34 AM  Result Value Ref Range   Troponin T High Sensitivity <15 0 - 19 ng/L    Comment: (NOTE) Biotin concentrations > 1000 ng/mL  falsely decrease TnT results.  Serial cardiac troponin measurements are suggested.  Refer to the Links section for chest pain algorithms and additional  guidance. Performed at [facility], [address] [place], [place] [address]   Urinalysis, Routine w reflex microscopic -Urine, Clean Catch     Status: Abnormal   Collection Time: 07/10/24  8:34 AM  Result Value Ref Range   Color, Urine COLORLESS (A) YELLOW   APPearance CLEAR (A) CLEAR    Specific Gravity, Urine 1.004 (L) 1.005 - 1.030   pH 7.0 5.0 - 8.0   Glucose, UA NEGATIVE NEGATIVE mg/dL   Hgb urine dipstick NEGATIVE NEGATIVE   Bilirubin Urine NEGATIVE NEGATIVE   Ketones, ur NEGATIVE NEGATIVE mg/dL   Protein, ur NEGATIVE NEGATIVE mg/dL   Nitrite NEGATIVE NEGATIVE   Leukocytes,Ua TRACE (A) NEGATIVE   RBC / HPF 0-5 0 - 5 RBC/hpf   WBC, UA 0-5 0 - 5 WBC/hpf   Bacteria, UA RARE (A) NONE SEEN   Squamous Epithelial / HPF 0-5 0 - 5 /HPF    Comment: Performed at [facility], [address], [place], [place] [address]  Urine Culture     Status: None   Collection Time: 07/10/24  8:34 AM   Specimen: Urine, Clean Catch  Result Value Ref Range   Specimen Description      URINE, CLEAN CATCH Performed at [facility], [address], [place], [place] [address]    Special Requests      NONE Performed at [facility], [address] [place], [place] [address]    Culture      NO GROWTH Performed at [facility], [address] [place], [place] [address]    Report Status 07/12/2024 FINAL   Troponin T, High Sensitivity     Status: None   Collection Time: 07/10/24 10:30 AM  Result Value Ref Range   Troponin T High Sensitivity <15 0 - 19 ng/L    Comment: (NOTE) Biotin concentrations > 1000 ng/mL falsely decrease TnT results.  Serial cardiac troponin measurements are suggested.  Refer to the Links section for chest pain algorithms and additional  guidance. Performed at [facility], [address] [place], [place] [address]       PHQ2/9:    07/16/2024    3:18 PM 05/12/2024    1:51 PM 05/05/2024    2:18 PM 12/29/2023    3:04 PM 08/19/2023    1:47 PM  Depression screen PHQ 2/9  Decreased Interest 0 0 0 2 3  Down, Depressed, Hopeless 0 0 0 2 3  PHQ - 2 Score 0 0 0 4 6  Altered sleeping  0 0 0 3  Tired, decreased energy  0 0 0 3  Change in appetite  0 0 0 3  Feeling bad or failure about yourself   0 0 0 0  Trouble  concentrating  0 0 3 3  Moving slowly or fidgety/restless  0 0 0 0  Suicidal thoughts  0 0 0 0  PHQ-9 Score  0 0 7  18   Difficult doing work/chores  Not difficult at all Not difficult at all Very difficult Very difficult     Data saved with a previous flowsheet row definition    phq 9 is negative  Fall Risk:    07/16/2024    3:17 PM 05/12/2024    1:51 PM 05/05/2024    2:18 PM 12/29/2023    2:53 PM 08/19/2023  1:41 PM  Fall Risk   Falls in the past year? 1 0 0 0 0  Number falls in past yr: 0 0 0 0 0  Injury with Fall?  0  0  0  0   Risk for fall due to : Impaired balance/gait No Fall Risks No Fall Risks No Fall Risks No Fall Risks  Follow up Falls evaluation completed Falls evaluation completed Falls evaluation completed Falls evaluation completed Falls prevention discussed;Education provided;Falls evaluation completed     Data saved with a previous flowsheet row definition     Assessment & Plan Scenic visual hallucinations Acute visual hallucinations resolved spontaneously. Normal CT and MRI ruled out stroke. Negative urine culture ruled out UTI. Differential includes metabolic, dementia, or psychiatric causes. - Consider neurology referral, discussed checking RPR, B1, B12 , folic acid and TSH but husband and patient prefer holding off on further tests at this time  - Consider psychiatric and or therapy, they are not interested on medications at this time  Cognitive dysfunction Difficulty with learning and technology. Family history of memory issues. Likely dementia but does not want to be tested ( cognitive screen ) at this time - Consider neurology referral if symptoms persist or worsen.  Major depressive disorder, recurrent, moderate Moderate recurrent depression with anhedonia, low mood, and hypersomnia. Declined antidepressants. - Discussed antidepressant benefits if she opts for treatment.  Hearing loss with hearing aid Inconsistent hearing aid use affecting  communication and possibly cognition. - Encouraged consistent hearing aid use.  Type 2 diabetes mellitus with dyslipidemia  Metformin  prescription refill needed. - Sent metformin  prescription to pharmacy.        [1]  Current Outpatient Medications:    amLODipine  (NORVASC ) 2.5 MG tablet, Take 1 tablet (2.5 mg total) by mouth 2 (two) times daily., Disp: 180 tablet, Rfl: 1   aspirin 81 MG EC tablet, Take 1 tablet by mouth daily., Disp: , Rfl:    azelastine (ASTELIN) 0.1 % nasal spray, Place 1-2 sprays into both nostrils 2 (two) times daily. Use in each nostril as directed, Disp: , Rfl:    cholecalciferol (VITAMIN D3) 25 MCG (1000 UNIT) tablet, Take 1,000 Units by mouth daily., Disp: , Rfl:    Cinnamon 500 MG capsule, Take 2,000 mg by mouth daily., Disp: , Rfl:    cyanocobalamin 100 MCG tablet, Take 1,000 mcg by mouth 2 (two) times a week., Disp: , Rfl:    dicyclomine  (BENTYL ) 20 MG tablet, Take 1 tablet (20 mg total) by mouth 3 (three) times daily as needed for spasms., Disp: 360 tablet, Rfl: 0   folic acid (FOLVITE) 1 MG tablet, Take 1 mg by mouth daily. , Disp: , Rfl:    glipiZIDE  (GLUCOTROL  XL) 5 MG 24 hr tablet, Take 1 tablet (5 mg total) by mouth every morning., Disp: 90 tablet, Rfl: 1   hydroxychloroquine (PLAQUENIL) 200 MG tablet, Take 200 mg by mouth 2 (two) times daily., Disp: , Rfl:    metFORMIN  (GLUCOPHAGE -XR) 750 MG 24 hr tablet, TAKE 2 TABLETS (1,500 MG TOTAL) DAILY WITH BREAKFAST, Disp: 180 tablet, Rfl: 1   methotrexate  (RHEUMATREX) 2.5 MG tablet, Take 20 mg by mouth once a week. Caution:Chemotherapy. Protect from light., Disp: , Rfl:    montelukast  (SINGULAIR ) 10 MG tablet, Take 1 tablet (10 mg total) by mouth at bedtime., Disp: 90 tablet, Rfl: 1   Multiple Vitamins-Minerals (MULTIVITAMIN ADULTS PO), Take 1 tablet by mouth daily. , Disp: , Rfl:    nebivolol  (BYSTOLIC ) 10 MG tablet,  Take 1 tablet (10 mg total) by mouth daily., Disp: 90 tablet, Rfl: 0   olmesartan  (BENICAR ) 40  MG tablet, Take 1 tablet (40 mg total) by mouth daily. In place of losartan, Disp: 90 tablet, Rfl: 1   pravastatin  (PRAVACHOL ) 40 MG tablet, Take 1 tablet (40 mg total) by mouth daily., Disp: 90 tablet, Rfl: 1   predniSONE  (DELTASONE ) 5 MG tablet, Take 5 mg by mouth daily., Disp: , Rfl:    spironolactone  (ALDACTONE ) 25 MG tablet, TAKE 1 TABLET (25 MG TOTAL) BY MOUTH DAILY. STOP POTASSIUM, Disp: 90 tablet, Rfl: 0   vitamin C (ASCORBIC ACID) 500 MG tablet, Take 1 tablet by mouth at bedtime. , Disp: , Rfl:    budesonide-formoterol (SYMBICORT) 160-4.5 MCG/ACT inhaler, Inhale 2 puffs into the lungs every morning. (Patient not taking: Reported on 07/16/2024), Disp: , Rfl:    fluticasone  (FLONASE ) 50 MCG/ACT nasal spray, USE 2 SPRAYS IN EACH NOSTRIL AT BEDTIME (Patient not taking: Reported on 07/16/2024), Disp: 48 g, Rfl: 3   glucose blood (ONETOUCH ULTRA) test strip, 1 each by Other route daily. Use as instructed (Patient not taking: Reported on 07/16/2024), Disp: 100 each, Rfl: 2   loratadine  (CLARITIN ) 10 MG tablet, Take 10 mg by mouth daily. (Patient not taking: Reported on 07/16/2024), Disp: , Rfl:  [2]  Allergies Allergen Reactions   Doxycycline  Other (See Comments)    Weakness, flu like symptoms   Codeine     Dizzy   Lantus  [Insulin Glargine]     made skin hot   Oxycodone      Dizziness   Rosuvastatin      Myalgia    Penicillin G Rash    And dizziness   Sitagliptin Rash   Sulfa Antibiotics Rash    Other reaction(s):   Red burning skin   Wound Dressing Adhesive Rash    Surgical tape adhesive

## 2024-09-03 ENCOUNTER — Ambulatory Visit: Admitting: Family Medicine
